# Patient Record
Sex: Male | Born: 1955
Health system: Southern US, Community
[De-identification: ages and names within clinical notes are randomized; demographics above are authoritative.]

## PROBLEM LIST (undated history)

## (undated) DIAGNOSIS — K259 Gastric ulcer, unspecified as acute or chronic, without hemorrhage or perforation: Secondary | ICD-10-CM

## (undated) DIAGNOSIS — R0981 Nasal congestion: Secondary | ICD-10-CM

## (undated) DIAGNOSIS — C801 Malignant (primary) neoplasm, unspecified: Secondary | ICD-10-CM

## (undated) DIAGNOSIS — N183 Chronic kidney disease, stage 3 unspecified: Secondary | ICD-10-CM

## (undated) DIAGNOSIS — K759 Inflammatory liver disease, unspecified: Secondary | ICD-10-CM

## (undated) DIAGNOSIS — I10 Essential (primary) hypertension: Secondary | ICD-10-CM

## (undated) DIAGNOSIS — J449 Chronic obstructive pulmonary disease, unspecified: Secondary | ICD-10-CM

## (undated) DIAGNOSIS — B1920 Unspecified viral hepatitis C without hepatic coma: Secondary | ICD-10-CM

## (undated) DIAGNOSIS — F419 Anxiety disorder, unspecified: Secondary | ICD-10-CM

## (undated) DIAGNOSIS — Z923 Personal history of irradiation: Secondary | ICD-10-CM

## (undated) HISTORY — PX: HERNIA REPAIR: SHX51

## (undated) HISTORY — PX: STOMACH SURGERY: SHX791

---

## 1998-10-27 ENCOUNTER — Encounter: Payer: Self-pay | Admitting: Emergency Medicine

## 1998-10-27 ENCOUNTER — Inpatient Hospital Stay (HOSPITAL_COMMUNITY): Admission: EM | Admit: 1998-10-27 | Discharge: 1998-10-28 | Payer: Self-pay | Admitting: Emergency Medicine

## 1999-05-18 ENCOUNTER — Emergency Department (HOSPITAL_COMMUNITY): Admission: EM | Admit: 1999-05-18 | Discharge: 1999-05-18 | Payer: Self-pay | Admitting: Emergency Medicine

## 1999-05-23 ENCOUNTER — Emergency Department (HOSPITAL_COMMUNITY): Admission: EM | Admit: 1999-05-23 | Discharge: 1999-05-23 | Payer: Self-pay | Admitting: Emergency Medicine

## 2000-02-16 ENCOUNTER — Encounter: Payer: Self-pay | Admitting: Emergency Medicine

## 2000-02-16 ENCOUNTER — Emergency Department (HOSPITAL_COMMUNITY): Admission: EM | Admit: 2000-02-16 | Discharge: 2000-02-16 | Payer: Self-pay | Admitting: Emergency Medicine

## 2000-07-18 ENCOUNTER — Emergency Department (HOSPITAL_COMMUNITY): Admission: EM | Admit: 2000-07-18 | Discharge: 2000-07-18 | Payer: Self-pay | Admitting: Emergency Medicine

## 2001-01-14 ENCOUNTER — Emergency Department (HOSPITAL_COMMUNITY): Admission: EM | Admit: 2001-01-14 | Discharge: 2001-01-14 | Payer: Self-pay | Admitting: Emergency Medicine

## 2001-01-14 ENCOUNTER — Encounter: Payer: Self-pay | Admitting: Emergency Medicine

## 2001-11-16 ENCOUNTER — Emergency Department (HOSPITAL_COMMUNITY): Admission: EM | Admit: 2001-11-16 | Discharge: 2001-11-16 | Payer: Self-pay | Admitting: Emergency Medicine

## 2002-07-09 ENCOUNTER — Inpatient Hospital Stay (HOSPITAL_COMMUNITY): Admission: EM | Admit: 2002-07-09 | Discharge: 2002-07-10 | Payer: Self-pay

## 2002-07-19 ENCOUNTER — Emergency Department (HOSPITAL_COMMUNITY): Admission: EM | Admit: 2002-07-19 | Discharge: 2002-07-19 | Payer: Self-pay | Admitting: Emergency Medicine

## 2002-07-19 ENCOUNTER — Encounter: Payer: Self-pay | Admitting: Emergency Medicine

## 2003-10-24 ENCOUNTER — Emergency Department (HOSPITAL_COMMUNITY): Admission: EM | Admit: 2003-10-24 | Discharge: 2003-10-24 | Payer: Self-pay | Admitting: Family Medicine

## 2004-04-06 ENCOUNTER — Emergency Department (HOSPITAL_COMMUNITY): Admission: EM | Admit: 2004-04-06 | Discharge: 2004-04-06 | Payer: Self-pay | Admitting: Emergency Medicine

## 2004-06-22 ENCOUNTER — Emergency Department (HOSPITAL_COMMUNITY): Admission: EM | Admit: 2004-06-22 | Discharge: 2004-06-22 | Payer: Self-pay | Admitting: Emergency Medicine

## 2004-08-03 ENCOUNTER — Emergency Department (HOSPITAL_COMMUNITY): Admission: EM | Admit: 2004-08-03 | Discharge: 2004-08-03 | Payer: Self-pay | Admitting: Emergency Medicine

## 2004-09-25 ENCOUNTER — Emergency Department (HOSPITAL_COMMUNITY): Admission: EM | Admit: 2004-09-25 | Discharge: 2004-09-25 | Payer: Self-pay | Admitting: Emergency Medicine

## 2006-03-12 ENCOUNTER — Emergency Department (HOSPITAL_COMMUNITY): Admission: EM | Admit: 2006-03-12 | Discharge: 2006-03-12 | Payer: Self-pay | Admitting: Emergency Medicine

## 2006-04-05 ENCOUNTER — Emergency Department (HOSPITAL_COMMUNITY): Admission: EM | Admit: 2006-04-05 | Discharge: 2006-04-05 | Payer: Self-pay | Admitting: Emergency Medicine

## 2006-04-10 ENCOUNTER — Ambulatory Visit: Payer: Self-pay | Admitting: Nurse Practitioner

## 2006-04-13 ENCOUNTER — Ambulatory Visit: Payer: Self-pay | Admitting: *Deleted

## 2008-09-21 ENCOUNTER — Ambulatory Visit: Payer: Self-pay | Admitting: Internal Medicine

## 2008-09-29 ENCOUNTER — Encounter: Payer: Self-pay | Admitting: Internal Medicine

## 2008-09-29 ENCOUNTER — Ambulatory Visit: Payer: Self-pay | Admitting: Internal Medicine

## 2008-09-29 ENCOUNTER — Ambulatory Visit (HOSPITAL_COMMUNITY): Admission: RE | Admit: 2008-09-29 | Discharge: 2008-09-29 | Payer: Self-pay | Admitting: Internal Medicine

## 2008-09-29 ENCOUNTER — Ambulatory Visit: Payer: Self-pay | Admitting: Surgery

## 2008-09-30 ENCOUNTER — Encounter (INDEPENDENT_AMBULATORY_CARE_PROVIDER_SITE_OTHER): Payer: Self-pay | Admitting: Internal Medicine

## 2008-09-30 LAB — CONVERTED CEMR LAB
Albumin: 4.6 g/dL (ref 3.5–5.2)
Basophils Relative: 1 % (ref 0–1)
Chloride: 102 meq/L (ref 96–112)
Cholesterol: 185 mg/dL (ref 0–200)
Folate: 10.2 ng/mL
Hemoglobin: 15.5 g/dL (ref 13.0–17.0)
LDL Cholesterol: 85 mg/dL (ref 0–99)
Lymphocytes Relative: 45 % (ref 12–46)
Lymphs Abs: 3.2 10*3/uL (ref 0.7–4.0)
MCV: 94.9 fL (ref 78.0–100.0)
Monocytes Relative: 11 % (ref 3–12)
Neutro Abs: 3.1 10*3/uL (ref 1.7–7.7)
Neutrophils Relative %: 42 % — ABNORMAL LOW (ref 43–77)
Platelets: 242 10*3/uL (ref 150–400)
RDW: 13.8 % (ref 11.5–15.5)
Total CHOL/HDL Ratio: 2.2
Total Protein: 8.1 g/dL (ref 6.0–8.3)
Triglycerides: 81 mg/dL (ref <150)
VLDL: 16 mg/dL (ref 0–40)
Vitamin B-12: 507 pg/mL (ref 211–911)
WBC: 7.2 10*3/uL (ref 4.0–10.5)

## 2008-10-15 ENCOUNTER — Ambulatory Visit: Payer: Self-pay | Admitting: Internal Medicine

## 2010-06-21 ENCOUNTER — Emergency Department (HOSPITAL_COMMUNITY): Admission: EM | Admit: 2010-06-21 | Discharge: 2010-06-21 | Payer: Self-pay | Admitting: Emergency Medicine

## 2010-08-25 ENCOUNTER — Emergency Department (HOSPITAL_COMMUNITY)
Admission: EM | Admit: 2010-08-25 | Discharge: 2010-08-25 | Payer: Self-pay | Source: Home / Self Care | Admitting: Emergency Medicine

## 2010-12-22 ENCOUNTER — Emergency Department (HOSPITAL_COMMUNITY): Payer: Self-pay

## 2010-12-22 ENCOUNTER — Emergency Department (HOSPITAL_COMMUNITY)
Admission: EM | Admit: 2010-12-22 | Discharge: 2010-12-22 | Disposition: A | Discharge status: Home / Self Care | Payer: Self-pay | Attending: Emergency Medicine | Admitting: Emergency Medicine

## 2010-12-22 DIAGNOSIS — M5412 Radiculopathy, cervical region: Secondary | ICD-10-CM | POA: Insufficient documentation

## 2010-12-22 DIAGNOSIS — G500 Trigeminal neuralgia: Secondary | ICD-10-CM | POA: Insufficient documentation

## 2010-12-22 DIAGNOSIS — R209 Unspecified disturbances of skin sensation: Secondary | ICD-10-CM | POA: Insufficient documentation

## 2010-12-22 DIAGNOSIS — M479 Spondylosis, unspecified: Secondary | ICD-10-CM | POA: Insufficient documentation

## 2010-12-22 DIAGNOSIS — M47812 Spondylosis without myelopathy or radiculopathy, cervical region: Secondary | ICD-10-CM | POA: Insufficient documentation

## 2010-12-22 DIAGNOSIS — M1990 Unspecified osteoarthritis, unspecified site: Secondary | ICD-10-CM | POA: Insufficient documentation

## 2010-12-22 LAB — DIFFERENTIAL
Basophils Relative: 1 % (ref 0–1)
Eosinophils Absolute: 0.2 10*3/uL (ref 0.0–0.7)
Eosinophils Relative: 3 % (ref 0–5)
Lymphs Abs: 2.8 10*3/uL (ref 0.7–4.0)
Monocytes Absolute: 0.9 10*3/uL (ref 0.1–1.0)

## 2010-12-22 LAB — CBC
MCHC: 35.3 g/dL (ref 30.0–36.0)
Platelets: 227 10*3/uL (ref 150–400)
RDW: 13.6 % (ref 11.5–15.5)

## 2010-12-22 LAB — POCT I-STAT, CHEM 8
Calcium, Ion: 1.12 mmol/L (ref 1.12–1.32)
Chloride: 103 mEq/L (ref 96–112)

## 2010-12-22 LAB — SEDIMENTATION RATE: Sed Rate: 0 mm/hr (ref 0–16)

## 2010-12-28 ENCOUNTER — Inpatient Hospital Stay (INDEPENDENT_AMBULATORY_CARE_PROVIDER_SITE_OTHER)
Admission: RE | Admit: 2010-12-28 | Discharge: 2010-12-28 | Disposition: A | Discharge status: Home / Self Care | Payer: Self-pay | Source: Ambulatory Visit

## 2010-12-28 DIAGNOSIS — M1990 Unspecified osteoarthritis, unspecified site: Secondary | ICD-10-CM

## 2010-12-28 DIAGNOSIS — G542 Cervical root disorders, not elsewhere classified: Secondary | ICD-10-CM

## 2010-12-28 DIAGNOSIS — R51 Headache: Secondary | ICD-10-CM

## 2011-01-20 NOTE — Discharge Summary (Signed)
   NAME:  [name] [name], [name] [name]                         ACCOUNT NO.:  [id number]   MEDICAL RECORD NO.:  [id number]                   PATIENT TYPE:  INP   LOCATION:  2018                                 FACILITY:  [facility]   PHYSICIAN:  Oretha Ellis, M.D.                DATE OF BIRTH:  1955-11-06   DATE OF ADMISSION:  07/09/2002  DATE OF DISCHARGE:  07/10/2002                                 DISCHARGE SUMMARY   DISCHARGE DIAGNOSIS:  Cocaine-related chest pain.   DISCHARGE MEDICATIONS:  None.   FOLLOW UP:  The patient will follow up with Dr. Oretha Ellis in [facility] on Wednesday, November 26, at 2:30 p.m.  Call [phone]  with questions.   H&P/HOSPITAL COURSE:  The patient is a 55 year old male in his usual state  of health until he went on a cocaine binge the night prior to admission.  He  got home, was washing dishes, and had 5-6/10 type pain from the right  shoulder to the left shoulder and up into his neck.  The pain was eased with  movement and deep inspiration.  The pain then suddenly came back.  He slept  through it and presented to the ER the next morning because the pain would  not go away.  In the ER the pain is relieved with sublingual nitroglycerin  and aspirin with no nausea and vomiting or diaphoresis, minimal shortness of  breath, and no orthopnea or PND.   The patient's physical exam was unremarkable.   EKG showed normal sinus rhythm with incomplete right bundle branch block  consistent with no changes from an EKG taken in the year 2000.  Cardiac  enzymes were negative with CK peaking at 5.07, MB peaking at 2.7, and  troponin I peaking at 0.2.  The patient has a half pack per day x15 years  smoking history and excessive alcohol history.  No family history of heart  disease.   DISPOSITION:  The patient was discharged home in stable condition after  ruling out for MI.  He is encouraged to cease cocaine and alcohol abuse.  He  will be discharged home  to the [facility] and is given information  regarding alcohol and drug rehabilitation programs within the [place]  area.  The patient is encouraged to go to the [facility]  for his regular clinical care.  He has an appointment with Dr. Viviann Spare on  November 26.                                               Oretha Ellis, M.D.    BT/MEDQ  D:  07/10/2002  T:  07/11/2002  Job:  [id number]

## 2012-03-21 ENCOUNTER — Emergency Department (HOSPITAL_COMMUNITY)
Admission: EM | Admit: 2012-03-21 | Discharge: 2012-03-21 | Disposition: A | Discharge status: Home / Self Care | Payer: Self-pay | Attending: Emergency Medicine | Admitting: Emergency Medicine

## 2012-03-21 ENCOUNTER — Encounter (HOSPITAL_COMMUNITY): Payer: Self-pay | Admitting: *Deleted

## 2012-03-21 DIAGNOSIS — J3489 Other specified disorders of nose and nasal sinuses: Secondary | ICD-10-CM | POA: Insufficient documentation

## 2012-03-21 DIAGNOSIS — E869 Volume depletion, unspecified: Secondary | ICD-10-CM | POA: Insufficient documentation

## 2012-03-21 DIAGNOSIS — F17200 Nicotine dependence, unspecified, uncomplicated: Secondary | ICD-10-CM | POA: Insufficient documentation

## 2012-03-21 HISTORY — DX: Gastric ulcer, unspecified as acute or chronic, without hemorrhage or perforation: K25.9

## 2012-03-21 LAB — BASIC METABOLIC PANEL
Calcium: 8.9 mg/dL (ref 8.4–10.5)
Creatinine, Ser: 0.72 mg/dL (ref 0.50–1.35)
GFR calc non Af Amer: >90 mL/min (ref >90)
Glucose, Bld: 77 mg/dL (ref 70–99)
Potassium: 3.7 mEq/L (ref 3.5–5.1)
Sodium: 137 mEq/L (ref 135–145)

## 2012-03-21 LAB — CBC
HCT: 42.5 % (ref 39.0–52.0)
Hemoglobin: 14.8 g/dL (ref 13.0–17.0)
MCH: 32.8 pg (ref 26.0–34.0)
MCHC: 34.8 g/dL (ref 30.0–36.0)
MCV: 94.2 fL (ref 78.0–100.0)
Platelets: 202 10*3/uL (ref 150–400)

## 2012-03-21 MED ORDER — SODIUM CHLORIDE 0.9 % IV BOLUS (SEPSIS)
2000.0000 mL | Freq: Once | INTRAVENOUS | Status: AC
  Administered 2012-03-21: 2000 mL via INTRAVENOUS

## 2012-03-21 MED ORDER — MORPHINE SULFATE 4 MG/ML IJ SOLN
4.0000 mg | Freq: Once | INTRAMUSCULAR | Status: AC
  Administered 2012-03-21: 4 mg via INTRAVENOUS
  Filled 2012-03-21: qty 1

## 2012-03-21 NOTE — ED Notes (Signed)
Per EMS pt complaining of dizziness since he woke up this morning, said he worked outside all day yesterday, went to birthday party last night, has lots of alcohol, didn't eat much, no water today, went to [facility] clinic, BP 143/87, CBG 121, orthostatics negative, HR 90, sinus, 250cc NS given.

## 2012-03-21 NOTE — ED Provider Notes (Signed)
History     CSN: [id number]  Arrival date & time 03/21/12  1109   First MD Initiated Contact with Patient 03/21/12 1112      Chief Complaint  Patient presents with  . Dehydration  . Dizziness    (Consider location/radiation/quality/duration/timing/severity/associated sxs/prior treatment) The history is provided by the patient.   patient reports ongoing dizziness as he woke up today.  He describes his dizziness as a lightheadedness and feeling faint when he stands up.  He denies weakness of his upper lower extremities.  His had no change in speech.  His had no confusion.  He's been able to walk without difficulty.  The patient reports working outside all day yesterday and then having approximately 12 beers last night.  Denies fevers or chills.  Has no prior history of stroke.  He denies melena or hematochezia.  He does report that he drinks alcohol most days.  Nothing improves his symptoms.  Symptoms are worsened by standing up Past Medical History  Diagnosis Date  . Stomach ulcer     Past Surgical History  Procedure Date  . Hernia repair     History reviewed. No pertinent family history.  History  Substance Use Topics  . Smoking status: Current Everyday Smoker  . Smokeless tobacco: Never Used  . Alcohol Use: Yes      Review of Systems  All other systems reviewed and are negative.    Allergies  Review of patient's allergies indicates no known allergies.  Home Medications   Current Outpatient Rx  Name Route Sig Dispense Refill  . ASPIRIN 81 MG PO TABS Oral Take 81 mg by mouth daily.      BP 113/93  Pulse 98  Temp 98.2 F (36.8 C)  Resp 18  SpO2 99%  Physical Exam  Nursing note and vitals reviewed. Constitutional: He is oriented to person, place, and time. He appears well-developed and well-nourished.  HENT:  Head: Normocephalic and atraumatic.  Eyes: EOM are normal. Pupils are equal, round, and reactive to light.  Neck: Normal range of motion.    Cardiovascular: Normal rate, regular rhythm, normal heart sounds and intact distal pulses.   Pulmonary/Chest: Effort normal and breath sounds normal. No respiratory distress.  Abdominal: Soft. He exhibits no distension. There is no tenderness.  Musculoskeletal: Normal range of motion.  Neurological: He is alert and oriented to person, place, and time.       5/5 strength in major muscle groups of  bilateral upper and lower extremities. Speech normal. No facial asymetry.   Skin: Skin is warm and dry.  Psychiatric: He has a normal mood and affect. Judgment normal.    ED Course  Procedures (including critical care time)   Labs Reviewed  CBC  BASIC METABOLIC PANEL   No results found.   1. Volume depletion   2. Sinus pressure       MDM  The patient has been hydrated in the emergency department.  He does report that he feels somewhat better.  He still has headache although it is improved.  He's been walking around the halls in the emergency apartment without difficulty.  Is normal neurologic exam and doubly this to be an acute stroke.  The patient be discharged home with close PCP followup.  I suspect this is volume depletion  1:38 PM The patient feels somewhat better at this time.  He now reports that when he stands up he develops pressure in his bilateral maxillary sinuses.  He has had some  nasal congestion.  Some the sounds were sinus related.  The patient we discharged him.  This is not the presentation of an acute stroke.         Lyanne Co, MD 03/21/12 [phone]

## 2012-03-21 NOTE — ED Notes (Signed)
Pt states feeling a lot better, pt explained discharge instructions, escorted to discharge window, in no distress.

## 2012-03-21 NOTE — ED Notes (Signed)
Pt given [place] sandwich, chips and ginger ale. Liter bolus hung.

## 2012-03-21 NOTE — ED Notes (Signed)
Pt states he worked outside all day yesterday, drink some water but not much, then drink 12+ beers last night at a birthday party, pt states woke up this morning feeling lightheaded, dizzy, shaky, did not eat a lot yesterday either, states has eaten a banana today and drink milk and water today. Pt states when he stands up he feels "off balance". Pt states he feels short of breath at times, but thinks its from his anxiety d/t the way he's feeling. Pt a/o x 4.

## 2012-03-21 NOTE — ED Notes (Signed)
500cc NS given by EMS

## 2012-06-14 ENCOUNTER — Emergency Department (HOSPITAL_COMMUNITY)
Admission: EM | Admit: 2012-06-14 | Discharge: 2012-06-14 | Disposition: A | Discharge status: Home / Self Care | Payer: Self-pay | Attending: Emergency Medicine | Admitting: Emergency Medicine

## 2012-06-14 ENCOUNTER — Encounter (HOSPITAL_COMMUNITY): Payer: Self-pay | Admitting: *Deleted

## 2012-06-14 DIAGNOSIS — J3489 Other specified disorders of nose and nasal sinuses: Secondary | ICD-10-CM | POA: Insufficient documentation

## 2012-06-14 DIAGNOSIS — F17200 Nicotine dependence, unspecified, uncomplicated: Secondary | ICD-10-CM | POA: Insufficient documentation

## 2012-06-14 MED ORDER — FLUTICASONE PROPIONATE 50 MCG/ACT NA SUSP: 2.0000 | Freq: Two times a day (BID) | NASAL | Status: DC

## 2012-06-14 NOTE — ED Notes (Signed)
Was seen at [facility] for same c/o facial pressure x 3 weeks.

## 2012-06-14 NOTE — ED Provider Notes (Signed)
History   This chart was scribed for No att. providers found by Toya Smothers. The patient was seen in room TR10C/TR10C. Patient's care was started at 1229.  CSN: [id number]  Arrival date & time 06/14/12  1229   First MD Initiated Contact with Patient 06/14/12 1430      Chief Complaint  Patient presents with  . Sinusitis   Patient is a 56 y.o. male presenting with sinusitis. The history is provided by the patient. No language interpreter was used.  Sinusitis  Associated symptoms include congestion. Pertinent negatives include no ear pain.   [name] [name] is a 56 y.o. male who presents to the Emergency Department complaining of 3 weeks of recurrent gradual onset moderate congestion, HA, and neck stiffness. Pain is described as similar to previous chronic episodes. He was evaluated at [facility] several weeks ago for identiacal symptoms and has had mild relief with pain medication. PTA symptoms have been treated with OTC Suphedrine providing mild temporary relief. Pt denies fever, chills, emesis, nausea, rash, and cough.    Past Medical History  Diagnosis Date  . Stomach ulcer     Past Surgical History  Procedure Date  . Hernia repair     No family history on file.  History  Substance Use Topics  . Smoking status: Current Every Day Smoker  . Smokeless tobacco: Never Used  . Alcohol Use: Yes    Review of Systems  Constitutional: Positive for fever.  HENT: Positive for congestion and neck stiffness. Negative for ear pain, nosebleeds and ear discharge.   Gastrointestinal: Negative for nausea and vomiting.  All other systems reviewed and are negative.    Allergies  Review of patient's allergies indicates no known allergies.  Home Medications   Current Outpatient Rx  Name Route Sig Dispense Refill  . ASPIRIN 81 MG PO TABS Oral Take 81 mg by mouth daily.    Marland Kitchen PHENYLEPHRINE HCL 10 MG PO TABS Oral Take 10 mg by mouth every 4 (four) hours as needed. For allergies    .  FLUTICASONE PROPIONATE 50 MCG/ACT NA SUSP Nasal Place 2 sprays into the nose 2 (two) times daily. 16 g 0    BP 98/77  Pulse 69  Temp 98.9 F (37.2 C) (Oral)  Resp 18  SpO2 98%  Physical Exam  Nursing note and vitals reviewed. Constitutional: He is oriented to person, place, and time. He appears well-developed and well-nourished. No distress.  HENT:  Head: Normocephalic and atraumatic.       Good ROM of the neck.  Eyes: Conjunctivae normal and EOM are normal.  Neck: Neck supple. No tracheal deviation present.  Cardiovascular: Normal rate.   Pulmonary/Chest: Effort normal. No respiratory distress.  Abdominal: He exhibits no distension.  Musculoskeletal: Normal range of motion.  Neurological: He is alert and oriented to person, place, and time. No sensory deficit.  Skin: Skin is dry.  Psychiatric: He has a normal mood and affect. His behavior is normal.    ED Course  Procedures  DIAGNOSTIC STUDIES: Oxygen Saturation is 98% on room air, normal by my interpretation.    COORDINATION OF CARE: 14:33- Evaluated Pt. Pt is awake, alert, and oriented. 14:36- Patientinformed of clinical course, understand medical decision-making process, and agree with plan. Plan: Home Medications- Flonase; Home Treatments- heated compress; Recommended follow up- HENT specialist if symptoms worsen and persist.  Labs Reviewed - No data to display No results found.   1. Sinus pain       MDM  Evaluation is consistent with acute sinusitis, possibly, bacterial. Doubt metabolic instability, serious bacterial infection or impending vascular collapse; the patient is stable for discharge.    I personally performed the services described in this documentation, which was scribed in my presence. The recorded information has been reviewed and considered.     Flint Melter, MD 06/14/12 [phone]

## 2012-06-14 NOTE — ED Notes (Signed)
Pt is here for sinus headache with sinus drainage for 3 weeks unrelieved with OTC medication

## 2012-06-17 ENCOUNTER — Emergency Department (HOSPITAL_COMMUNITY)
Admission: EM | Admit: 2012-06-17 | Discharge: 2012-06-17 | Disposition: A | Discharge status: Home / Self Care | Payer: Self-pay | Attending: Emergency Medicine | Admitting: Emergency Medicine

## 2012-06-17 ENCOUNTER — Encounter (HOSPITAL_COMMUNITY): Payer: Self-pay | Admitting: Emergency Medicine

## 2012-06-17 DIAGNOSIS — F17200 Nicotine dependence, unspecified, uncomplicated: Secondary | ICD-10-CM | POA: Insufficient documentation

## 2012-06-17 DIAGNOSIS — J329 Chronic sinusitis, unspecified: Secondary | ICD-10-CM | POA: Insufficient documentation

## 2012-06-17 MED ORDER — SULFAMETHOXAZOLE-TRIMETHOPRIM 800-160 MG PO TABS: 1.0000 | ORAL_TABLET | Freq: Two times a day (BID) | ORAL | Status: DC

## 2012-06-17 MED ORDER — HYDROCODONE-ACETAMINOPHEN 5-325 MG PO TABS: 1.0000 | ORAL_TABLET | Freq: Four times a day (QID) | ORAL | Status: DC | PRN

## 2012-06-17 NOTE — ED Provider Notes (Signed)
History  This chart was scribed for [name] Jakes, MD by Shari Heritage. The patient was seen in room TR10C/TR10C. Patient's care was started at 1710.     CSN: [id number]  Arrival date & time 06/17/12  1552   First MD Initiated Contact with Patient 06/17/12 1710      Chief Complaint  Patient presents with  . Headache     The history is provided by the patient. No language interpreter was used.    KHALEEF [name] is a 56 y.o. male who presents to the Emergency Department complaining of constant, moderate, maxillary sinus pressure onset several weeks ago, but has worsened today. There is associated neck pain and mild SOB. Patient was seen by Dr. Effie Shy on 06/14/12 complaining of the same HA, neck stiffness and congestion. Patient was diagnosed with acute sinusitis. He was discharged with prescription for fluticasone. Patient states that he was improving after using the medication, but now the HA has returned. Patient denies fever, chest pain, abdominal pain. Patient has a history of stomach ulcer and hernia repair. She is a current every day smoker.     Past Medical History  Diagnosis Date  . Stomach ulcer     Past Surgical History  Procedure Date  . Hernia repair     History reviewed. No pertinent family history.  History  Substance Use Topics  . Smoking status: Current Every Day Smoker  . Smokeless tobacco: Never Used  . Alcohol Use: Yes      Review of Systems  HENT: Positive for neck pain.   Cardiovascular: Negative for chest pain.  Gastrointestinal: Negative for abdominal pain.    Allergies  Review of patient's allergies indicates no known allergies.  Home Medications   Current Outpatient Rx  Name Route Sig Dispense Refill  . ASPIRIN EC 81 MG PO TBEC Oral Take 81 mg by mouth daily.    Marland Kitchen FLUTICASONE PROPIONATE 50 MCG/ACT NA SUSP Nasal Place 2 sprays into the nose 2 (two) times daily. 16 g 0  . OXYMETAZOLINE HCL 0.05 % NA SOLN Nasal Place 2 sprays into  the nose 2 (two) times daily as needed. For sinus pain    . PHENYLEPHRINE HCL 10 MG PO TABS Oral Take 10 mg by mouth every 4 (four) hours as needed. For allergies    . HYDROCODONE-ACETAMINOPHEN 5-325 MG PO TABS Oral Take 1-2 tablets by mouth every 6 (six) hours as needed for pain. 14 tablet 0  . SULFAMETHOXAZOLE-TRIMETHOPRIM 800-160 MG PO TABS Oral Take 1 tablet by mouth every 12 (twelve) hours. 20 tablet 0    BP 110/75  Pulse 71  Temp 98.8 F (37.1 C) (Oral)  Resp 16  SpO2 98%  Physical Exam  Nursing note and vitals reviewed. Constitutional: He is oriented to person, place, and time. He appears well-developed and well-nourished.  HENT:  Head: Normocephalic and atraumatic.  Nose: Right sinus exhibits maxillary sinus tenderness. Right sinus exhibits no frontal sinus tenderness. Left sinus exhibits maxillary sinus tenderness. Left sinus exhibits no frontal sinus tenderness.       Mild pressure over the maxillary sinus. No pressure over frontal sinus.  Cardiovascular: Normal rate and regular rhythm.   No murmur heard. Pulmonary/Chest: Effort normal and breath sounds normal. No respiratory distress. He has no wheezes. He has no rales.  Abdominal: Soft. Bowel sounds are normal. There is no tenderness.  Lymphadenopathy:    He has no cervical adenopathy.  Neurological: He is alert and oriented to person, place, and  time.    ED Course  Procedures (including critical care time) DIAGNOSTIC STUDIES: Oxygen Saturation is 98% on room air, normal by my interpretation.    COORDINATION OF CARE: 5:48pm- Patient informed of current plan for treatment and evaluation and agrees with plan at this time.      Labs Reviewed - No data to display No results found.   1. Sinusitis       MDM  Patient with persistent sinus discomfort we'll treat with antibiotics Septra double strength and also pain medicine. Patient in no acute distress some mild bilateral maxillary sinus tenderness no frontal  sinus tenderness.     I personally performed the services described in this documentation, which was scribed in my presence. The recorded information has been reviewed and considered.     [name] Jakes, MD 06/17/12 [phone]

## 2012-06-17 NOTE — ED Notes (Signed)
Pt sts was started on steroids for sinus inflammation and sts is worse today with pressure and pain in neck

## 2012-10-12 ENCOUNTER — Encounter (HOSPITAL_COMMUNITY): Payer: Self-pay | Admitting: *Deleted

## 2012-10-12 ENCOUNTER — Emergency Department (HOSPITAL_COMMUNITY)
Admission: EM | Admit: 2012-10-12 | Discharge: 2012-10-12 | Disposition: A | Discharge status: Home / Self Care | Payer: Self-pay | Attending: Emergency Medicine | Admitting: Emergency Medicine

## 2012-10-12 DIAGNOSIS — Z8711 Personal history of peptic ulcer disease: Secondary | ICD-10-CM | POA: Insufficient documentation

## 2012-10-12 DIAGNOSIS — F17200 Nicotine dependence, unspecified, uncomplicated: Secondary | ICD-10-CM | POA: Insufficient documentation

## 2012-10-12 DIAGNOSIS — J019 Acute sinusitis, unspecified: Secondary | ICD-10-CM

## 2012-10-12 DIAGNOSIS — Z7982 Long term (current) use of aspirin: Secondary | ICD-10-CM | POA: Insufficient documentation

## 2012-10-12 DIAGNOSIS — J0100 Acute maxillary sinusitis, unspecified: Secondary | ICD-10-CM | POA: Insufficient documentation

## 2012-10-12 MED ORDER — FLUTICASONE PROPIONATE 50 MCG/ACT NA SUSP: 2.0000 | Freq: Every day | NASAL | Status: DC

## 2012-10-12 MED ORDER — AMOXICILLIN 500 MG PO CAPS: 500.0000 mg | ORAL_CAPSULE | Freq: Three times a day (TID) | ORAL | Status: DC

## 2012-10-12 NOTE — ED Provider Notes (Signed)
Medical screening examination/treatment/procedure(s) were performed by non-physician practitioner and as supervising physician I was immediately available for consultation/collaboration.  Toy Baker, MD 10/12/12 1524

## 2012-10-12 NOTE — ED Provider Notes (Signed)
History     CSN: [id number]  Arrival date & time 10/12/12  1018   First MD Initiated Contact with Patient 10/12/12 1203      Chief Complaint  Patient presents with  . Facial Pain  . Headache    (Consider location/radiation/quality/duration/timing/severity/associated sxs/prior treatment) HPI  [name] [name] is a 57 y.o. male complaining of sinus congestion and pain worsening over the course of 2 weeks. Patient has multiple prior similar episodes. He denies fever, cough, shortness of breath. He's been using OTC aspirin without relief.  Past Medical History  Diagnosis Date  . Stomach ulcer     Past Surgical History  Procedure Laterality Date  . Hernia repair      History reviewed. No pertinent family history.  History  Substance Use Topics  . Smoking status: Current Every Day Smoker  . Smokeless tobacco: Never Used  . Alcohol Use: Yes      Review of Systems  Constitutional: Negative for fever.  HENT: Positive for congestion and sinus pressure.   Respiratory: Negative for shortness of breath.   Cardiovascular: Negative for chest pain.  Gastrointestinal: Negative for nausea, vomiting, abdominal pain and diarrhea.  All other systems reviewed and are negative.    Allergies  Review of patient's allergies indicates no known allergies.  Home Medications   Current Outpatient Rx  Name  Route  Sig  Dispense  Refill  . fluticasone (FLONASE) 50 MCG/ACT nasal spray   Nasal   Place 2 sprays into the nose 2 (two) times daily.   16 g   0   . oxymetazoline (AFRIN) 0.05 % nasal spray   Nasal   Place 2 sprays into the nose 2 (two) times daily as needed. For sinus pain         . aspirin EC 81 MG tablet   Oral   Take 81 mg by mouth daily.           BP 107/72  Pulse 63  Temp(Src) 98 F (36.7 C) (Oral)  Resp 16  SpO2 100%  Physical Exam  Nursing note and vitals reviewed. Constitutional: He is oriented to person, place, and time. He appears well-developed  and well-nourished. No distress.  HENT:  Head: Normocephalic.  Right Ear: External ear normal.  Left Ear: External ear normal.  Mouth/Throat: Oropharynx is clear and moist.  Mild tenderness to palpation of bilateral maxillary sinuses, posterior pharynx is mildly injected.  Eyes: Conjunctivae and EOM are normal. Pupils are equal, round, and reactive to light.  Cardiovascular: Normal rate.   Pulmonary/Chest: Effort normal. No stridor.  Musculoskeletal: Normal range of motion.  Neurological: He is alert and oriented to person, place, and time.  Psychiatric: He has a normal mood and affect.    ED Course  Procedures (including critical care time)  Labs Reviewed - No data to display No results found.   1. Acute sinusitis       MDM     Filed Vitals:   10/12/12 1034  BP: 107/72  Pulse: 63  Temp: 98 F (36.7 C)  TempSrc: Oral  Resp: 16  SpO2: 100%     Pt verbalized understanding and agrees with care plan. Outpatient follow-up and return precautions given.    New Prescriptions   AMOXICILLIN (AMOXIL) 500 MG CAPSULE    Take 1 capsule (500 mg total) by mouth 3 (three) times daily.   FLUTICASONE (FLONASE) 50 MCG/ACT NASAL SPRAY    Place 2 sprays into the nose daily.  Wynetta Emery, PA-C 10/12/12 1319

## 2012-10-12 NOTE — ED Notes (Signed)
Pt c/o of soreness/tightness and tenderness in frontal sinuses. Pt reports he has been here a few times for the same issues that past couple months. Pt reports they told him he has a sinus infection. He took the antibiotics and finished all of them and felt much better afterwards, pt reports about 2 weeks ago he started to have symptoms.

## 2012-10-12 NOTE — ED Notes (Signed)
Reports having facial, head congestion and headache yesterday no relief with otc meds. Thinks its related to his sinuses. No acute distress noted at triage.

## 2012-11-13 ENCOUNTER — Emergency Department (HOSPITAL_COMMUNITY)
Admission: EM | Admit: 2012-11-13 | Discharge: 2012-11-13 | Disposition: A | Discharge status: Home / Self Care | Payer: Self-pay | Source: Home / Self Care

## 2012-11-13 ENCOUNTER — Encounter (HOSPITAL_COMMUNITY): Payer: Self-pay

## 2012-11-13 DIAGNOSIS — J329 Chronic sinusitis, unspecified: Secondary | ICD-10-CM

## 2012-11-13 MED ORDER — FLUTICASONE PROPIONATE 50 MCG/ACT NA SUSP: 2.0000 | Freq: Two times a day (BID) | NASAL | Status: DC

## 2012-11-13 MED ORDER — TRAMADOL HCL 50 MG PO TABS: 50.0000 mg | ORAL_TABLET | Freq: Four times a day (QID) | ORAL | Status: DC | PRN

## 2012-11-13 MED ORDER — AMOXICILLIN-POT CLAVULANATE 875-125 MG PO TABS: 1.0000 | ORAL_TABLET | Freq: Two times a day (BID) | ORAL | Status: DC

## 2012-11-13 NOTE — ED Provider Notes (Signed)
History     CSN: [id number]  Arrival date & time 11/13/12  1125  Chief concern headache     Chief Complaint  Patient presents with  . Establish Care    (Consider location/radiation/quality/duration/timing/severity/associated sxs/prior treatment) HPI Patient is 57 year old male who presents to clinic with main concern of frontal area headaches. He explains he has history of sinusitis and has recently been treated with antibiotic but that has not helped. He continues to experience bowel and pressure-like headache, constant and 7/10 severity, associated with generalized weakness. He reports subjective fevers and chills, intermittent episodes of sore throat. He denies difficulty swallowing. Patient denies chest pain or shortness of breath, no other systemic symptoms, no other associated symptoms, no specific aggravating or alleviating factors. He explains he has been taking over-the-counter Sudafed but has stopped since it did not alleviate pain. Patient denies vision changes, no focal neurological symptoms. Patient denies ear pain, discharge from ears.  Past Medical History  Diagnosis Date  . Stomach ulcer     Past Surgical History  Procedure Laterality Date  . Hernia repair      No known medical family history.  History  Substance Use Topics  . Smoking status: Current Every Day Smoker  . Smokeless tobacco: Never Used  . Alcohol Use: Yes     Review of Systems  Constitutional: positive\ for fever, chills, negative for diaphoresis, activity change, appetite change and fatigue.  HENT: Negative for ear pain, nosebleeds, congestion, facial swelling, rhinorrhea, neck pain, neck stiffness and ear discharge.   Eyes: Negative for pain, discharge, redness, itching and visual disturbance.  Respiratory: Negative for choking, chest tightness, shortness of breath, wheezing and stridor.   Cardiovascular: Negative for chest pain, palpitations and leg swelling.  Gastrointestinal: Negative  for abdominal distention.  Genitourinary: Negative for dysuria, urgency, frequency, hematuria, flank pain, decreased urine volume, difficulty urinating and dyspareunia.  Musculoskeletal: Negative for back pain, joint swelling, arthralgias and gait problem.  Neurological: Negative for dizziness, tremors, seizures, syncope, facial asymmetry, speech difficulty, weakness, light-headedness, numbness and headaches.  Hematological: Negative for adenopathy. Does not bruise/bleed easily.  Psychiatric/Behavioral: Negative for hallucinations, behavioral problems, confusion, dysphoric mood, decreased concentration and agitation.    Allergies  Review of patient's allergies indicates no known allergies.  Home Medications   Current Outpatient Rx  Name  Route  Sig  Dispense  Refill  . amoxicillin (AMOXIL) 500 MG capsule   Oral   Take 1 capsule (500 mg total) by mouth 3 (three) times daily.   21 capsule   0   . aspirin EC 81 MG tablet   Oral   Take 81 mg by mouth daily.         . fluticasone (FLONASE) 50 MCG/ACT nasal spray   Nasal   Place 2 sprays into the nose 2 (two) times daily.   16 g   0   . fluticasone (FLONASE) 50 MCG/ACT nasal spray   Nasal   Place 2 sprays into the nose daily.   16 g   0   . oxymetazoline (AFRIN) 0.05 % nasal spray   Nasal   Place 2 sprays into the nose 2 (two) times daily as needed. For sinus pain           BP 117/84  Pulse 64  Temp(Src) 98.5 F (36.9 C) (Oral)  SpO2 100%  Physical Exam Physical Exam  Constitutional: Appears well-developed and well-nourished. No distress.  HENT: Normocephalic. External right and left ear normal. Oropharynx is slightly erythematous.  frontal and maxillary sinus tenderness Eyes: Conjunctivae and EOM are normal. PERRLA, no scleral icterus.  Neck: Normal ROM. Neck supple. No JVD. No tracheal deviation. No thyromegaly.  CVS: RRR, S1/S2 +, no murmurs, no gallops, no carotid bruit.  Pulmonary: Effort and breath sounds  normal, no stridor, rhonchi, wheezes, rales.  Abdominal: Soft. BS +,  no distension, tenderness, rebound or guarding.  Musculoskeletal: Normal range of motion. No edema and no tenderness.  Lymphadenopathy: No lymphadenopathy noted, cervical, inguinal. Neuro: Alert. Normal reflexes, muscle tone coordination. No cranial nerve deficit. Skin: Skin is warm and dry. No rash noted. Not diaphoretic. No erythema. No pallor.  Psychiatric: Normal mood and affect. Behavior, judgment, thought content normal.    ED Course  Procedures (including critical care time)  Labs Reviewed - No data to display No results found.  Sinusitis - Symptoms and physical exam findings consistent with acute sinusitis - H. and has significant tenderness on exam and frontal and maxillary area associated with erythema in oropharynx - I will provide course of Augmentin for 10 days - Patient advise to place warm compresses or cold compresses whichever it alleviates the discomfort in the sinus areas - Pt  also advised to come back to clinic if his symptoms do not improve in 1-2 weeks    MDM  Sinusitis - ABX treatment        Alison Murray, MD 11/13/12 1215

## 2012-11-13 NOTE — ED Notes (Signed)
Patient here to establish care\ Complains of sinus problems, pain in face SOB Has been going on and off for almost 6 months

## 2013-01-28 ENCOUNTER — Ambulatory Visit: Payer: Self-pay

## 2013-01-29 ENCOUNTER — Ambulatory Visit: Payer: No Typology Code available for payment source | Attending: Family Medicine | Admitting: Internal Medicine

## 2013-01-29 VITALS — BP 104/72 | HR 86 | Temp 98.9°F | Resp 18 | Wt 142.6 lb

## 2013-01-29 DIAGNOSIS — J329 Chronic sinusitis, unspecified: Secondary | ICD-10-CM | POA: Insufficient documentation

## 2013-01-29 MED ORDER — FLUTICASONE PROPIONATE 50 MCG/ACT NA SUSP: 2.0000 | Freq: Every day | NASAL | Status: DC

## 2013-01-29 MED ORDER — OXYMETAZOLINE HCL 0.05 % NA SOLN: 2.0000 | Freq: Two times a day (BID) | NASAL | Status: DC | PRN

## 2013-01-29 MED ORDER — LORATADINE-PSEUDOEPHEDRINE ER 10-240 MG PO TB24: 1.0000 | ORAL_TABLET | Freq: Every day | ORAL | Status: DC

## 2013-01-29 MED ORDER — AMOXICILLIN-POT CLAVULANATE 875-125 MG PO TABS: 1.0000 | ORAL_TABLET | Freq: Two times a day (BID) | ORAL | Status: DC

## 2013-01-29 NOTE — Progress Notes (Signed)
Patient states suffers from sinus pain Has been seen in the past for the same problem

## 2013-01-29 NOTE — Patient Instructions (Addendum)
Sinusitis Sinusitis is redness, soreness, and puffiness (inflammation) of the air pockets in the bones of your face (sinuses). The redness, soreness, and puffiness can cause air and mucus to get trapped in your sinuses. This can allow germs to grow and cause an infection.  HOME CARE   Drink enough fluids to keep your pee (urine) clear or pale yellow.  Use a humidifier in your home.  Run a hot shower to create steam in the bathroom. Sit in the bathroom with the door closed. Breathe in the steam 3 4 times a day.  Put a warm, moist washcloth on your face 3 4 times a day, or as told by your doctor.  Use salt water sprays (saline sprays) to wet the thick fluid in your nose. This can help the sinuses drain.  Only take medicine as told by your doctor. GET HELP RIGHT AWAY IF:   Your pain gets worse.  You have very bad headaches.  You are sick to your stomach (nauseous).  You throw up (vomit).  You are very sleepy (drowsy) all the time.  Your face is puffy (swollen).  Your vision changes.  You have a stiff neck.  You have trouble breathing. MAKE SURE YOU:   Understand these instructions.  Will watch your condition.  Will get help right away if you are not doing well or get worse. Document Released: 02/07/2008 Document Revised: 05/15/2012 Document Reviewed: 03/26/2012 [employer] Patient Information 2014 [employer].  

## 2013-01-29 NOTE — Progress Notes (Signed)
Patient ID: [name] [name], male   DOB: 1956/08/08, 57 y.o.   MRN: [id number] Patient Demographics  [name] [name], is a 57 y.o. male  [name]  [name]  DOB - [birth year]  Chief Complaint  Patient presents with  . Sinus Problem        Subjective:   [name] [name] today is here for a follow up visit. Patient states that he could not afford Flonase. His symptoms improved when he was started on Augmentin but they're returning back. Patient is frustrated with sinus congestion and headache, nasal stuffiness. No cough or any fevers chills   Patient has  No chest pain, No abdominal pain - No Nausea, No new weakness tingling or numbness, No Cough - SOB.   Objective:    Filed Vitals:   01/29/13 1638  BP: 104/72  Pulse: 86  Temp: 98.9 F (37.2 C)  Resp: 18  Weight: 142 lb 9.6 oz (64.683 kg)  SpO2: 99%     ALLERGIES:  No Known Allergies  PAST MEDICAL HISTORY: Past Medical History  Diagnosis Date  . Stomach ulcer     MEDICATIONS AT HOME: Prior to Admission medications   Medication Sig Start Date End Date Taking? Authorizing Provider  amoxicillin-clavulanate (AUGMENTIN) 875-125 MG per tablet Take 1 tablet by mouth 2 (two) times daily. X 10days 01/29/13   Cylas Falzone Jenna Luo, MD  aspirin EC 81 MG tablet Take 81 mg by mouth daily.    Historical Provider, MD  fluticasone (FLONASE) 50 MCG/ACT nasal spray Place 2 sprays into the nose 2 (two) times daily. 11/13/12   Alison Murray, MD  fluticasone (FLONASE) 50 MCG/ACT nasal spray Place 2 sprays into the nose daily. 01/29/13   Deseri Loss Jenna Luo, MD  loratadine-pseudoephedrine (CLARITIN-D 24 HOUR) 10-240 MG per 24 hr tablet Take 1 tablet by mouth daily. 01/29/13   Jaedin Regina Jenna Luo, MD  oxymetazoline (AFRIN) 0.05 % nasal spray Place 2 sprays into the nose 2 (two) times daily as needed. For sinus pain 01/29/13   Torben Soloway Jenna Luo, MD  traMADol (ULTRAM) 50 MG tablet Take 1 tablet (50 mg total) by mouth every 6 (six) hours as needed for pain.  11/13/12   Alison Murray, MD     Exam  General appearance :Awake, alert, NAD, Speech Clear.  HEENT: Atraumatic and Normocephalic, PERLA Neck: supple, no JVD. No cervical lymphadenopathy. No maxillary sinus tenderness  Chest: Clear to auscultation bilaterally, no wheezing, rales or rhonchi CVS: S1 S2 regular, no murmurs.  Abdomen: soft, NBS, NT, ND, no gaurding, rigidity or rebound. Extremities: no cyanosis or clubbing, B/L Lower Ext shows no edema Neurology: Awake alert, and oriented X 3, CN II-XII intact, Non focal Skin: No Rash or lesions Wounds:N/A    Data Review   Basic Metabolic Panel: No results found for this basename: NA, K, CL, CO2, GLUCOSE, BUN, CREATININE, CALCIUM, MG, PHOS,  in the last 168 hours Liver Function Tests: No results found for this basename: AST, ALT, ALKPHOS, BILITOT, PROT, ALBUMIN,  in the last 168 hours  CBC: No results found for this basename: WBC, NEUTROABS, HGB, HCT, MCV, PLT,  in the last 168 hours  ------------------------------------------------------------------------------------------------------------------ No results found for this basename: HGBA1C,  in the last 72 hours ------------------------------------------------------------------------------------------------------------------ No results found for this basename: CHOL, HDL, LDLCALC, TRIG, CHOLHDL, LDLDIRECT,  in the last 72 hours ------------------------------------------------------------------------------------------------------------------ No results found for this basename: TSH, T4TOTAL, FREET3, T3FREE, THYROIDAB,  in the last 72 hours ------------------------------------------------------------------------------------------------------------------ No results found for this basename: VITAMINB12,  FOLATE, FERRITIN, TIBC, IRON, RETICCTPCT,  in the last 72 hours  Coagulation profile  No results found for this basename: INR, PROTIME,  in the last 168 hours    Assessment & Plan    Active Problems: Sinusitis - Advised patient to stay hydrated, started on Claritin-D - He has lost of prescription of Flonase, refilled Flonase, Afrin nasal spray - He prefers Augmentin, will continue for another 10 days  Patient was advised to call if not improving in next 2 weeks, he will likely need CT sinuses and referral to ENT  Follow-up in 3 months if doing well     Ignacia Gentzler M.D. 01/29/2013, 5:15 PM

## 2013-05-18 ENCOUNTER — Encounter (HOSPITAL_COMMUNITY): Payer: Self-pay | Admitting: Family Medicine

## 2013-05-18 ENCOUNTER — Emergency Department (HOSPITAL_COMMUNITY)
Admission: EM | Admit: 2013-05-18 | Discharge: 2013-05-18 | Disposition: A | Discharge status: Home / Self Care | Payer: No Typology Code available for payment source | Attending: Emergency Medicine | Admitting: Emergency Medicine

## 2013-05-18 DIAGNOSIS — F17200 Nicotine dependence, unspecified, uncomplicated: Secondary | ICD-10-CM | POA: Insufficient documentation

## 2013-05-18 DIAGNOSIS — J3489 Other specified disorders of nose and nasal sinuses: Secondary | ICD-10-CM | POA: Insufficient documentation

## 2013-05-18 DIAGNOSIS — Z7982 Long term (current) use of aspirin: Secondary | ICD-10-CM | POA: Insufficient documentation

## 2013-05-18 DIAGNOSIS — Z8711 Personal history of peptic ulcer disease: Secondary | ICD-10-CM | POA: Insufficient documentation

## 2013-05-18 MED ORDER — AMOXICILLIN 500 MG PO CAPS: 500.0000 mg | ORAL_CAPSULE | Freq: Three times a day (TID) | ORAL | Status: DC

## 2013-05-18 MED ORDER — FLUTICASONE PROPIONATE 50 MCG/ACT NA SUSP: 2.0000 | Freq: Two times a day (BID) | NASAL | Status: DC

## 2013-05-18 NOTE — ED Provider Notes (Signed)
CSN: [id number]     Arrival date & time 05/18/13  4098 History   First MD Initiated Contact with Patient 05/18/13 [phone]     Chief Complaint  Patient presents with  . Facial Pain   (Consider location/radiation/quality/duration/timing/severity/associated sxs/prior Treatment) HPI Comments: Patient states he has had sinus problems for the past year and been treated multiple times for sinusitis.  Using flonase, afrin, claritin D. For the past 5 days, he has had worsening facial pressure and pain.  Denies SOB, chest pain, fever, vision change, weakness, numbness, tingling. He feels that he has congestion in his face that he cannot get out. Good by mouth intake and urine output. No vomiting or nausea. No fever. Has not seen ENT. Last antibiotics in June.  The history is provided by the patient.    Past Medical History  Diagnosis Date  . Stomach ulcer    Past Surgical History  Procedure Laterality Date  . Hernia repair     History reviewed. No pertinent family history. History  Substance Use Topics  . Smoking status: Current Every Day Smoker  . Smokeless tobacco: Never Used  . Alcohol Use: Yes    Review of Systems  Constitutional: Negative for fever, activity change and appetite change.  HENT: Positive for congestion and sinus pressure. Negative for sore throat, rhinorrhea and trouble swallowing.   Respiratory: Negative for cough, chest tightness and shortness of breath.   Gastrointestinal: Negative for nausea, vomiting and abdominal pain.  Genitourinary: Negative for dysuria and hematuria.  Musculoskeletal: Negative for back pain.  Skin: Negative for rash.  Neurological: Negative for dizziness, weakness and headaches.  A complete 10 system review of systems was obtained and all systems are negative except as noted in the HPI and PMH.    Allergies  Review of patient's allergies indicates no known allergies.  Home Medications   Current Outpatient Rx  Name  Route  Sig  Dispense   Refill  . acetaminophen (TYLENOL) 325 MG tablet   Oral   Take 650 mg by mouth every 6 (six) hours as needed for pain.         Marland Kitchen aspirin EC 81 MG tablet   Oral   Take 81 mg by mouth daily.         Jannetta Quint SALINE NASAL NA   Nasal   Place 1 spray into the nose daily as needed.         . cetirizine (ZYRTEC) 10 MG tablet   Oral   Take 10 mg by mouth daily.         Marland Kitchen amoxicillin (AMOXIL) 500 MG capsule   Oral   Take 1 capsule (500 mg total) by mouth 3 (three) times daily.   21 capsule   0   . fluticasone (FLONASE) 50 MCG/ACT nasal spray   Nasal   Place 2 sprays into the nose 2 (two) times daily.   16 g   1    BP 137/98  Pulse 54  Temp(Src) 97.8 F (36.6 C) (Oral)  Resp 16  SpO2 100% Physical Exam  Constitutional: He is oriented to person, place, and time. He appears well-developed and well-nourished. No distress.  HENT:  Head: Normocephalic and atraumatic.  Right Ear: External ear normal.  Left Ear: External ear normal.  Mouth/Throat: Oropharynx is clear and moist. No oropharyngeal exudate.  OP clear.  Minimal frontal and maxillary sinus tenderness.   Eyes: Conjunctivae and EOM are normal. Pupils are equal, round, and reactive to light.  Neck: Normal range of motion. Neck supple.  No meningismus  Cardiovascular: Normal rate, regular rhythm and normal heart sounds.   No murmur heard. Pulmonary/Chest: Effort normal and breath sounds normal. No respiratory distress.  Abdominal: Soft. There is no tenderness. There is no rebound and no guarding.  Musculoskeletal: Normal range of motion. He exhibits no edema and no tenderness.  Neurological: He is alert and oriented to person, place, and time. No cranial nerve deficit. He exhibits normal muscle tone. Coordination normal.  CN 2-12 intact, no ataxia on finger to nose, no nystagmus, 5/5 strength throughout, no pronator drift, Romberg negative, normal gait.   Skin: Skin is warm.    ED Course  Procedures (including  critical care time) Labs Review Labs Reviewed - No data to display Imaging Review No results found.  MDM   1. Sinus pain    Acute on chronic sinus pain. Vital stable. No distress. No fever.  Minimal sinus tenderness with clear oropharynx. Will restart Flonase nasal spray, continue decongestants. We'll give course of antibiotics. Smoking cessation encouraged. ENT followup given.    Glynn Octave, MD 05/18/13 [phone]

## 2013-05-18 NOTE — ED Notes (Signed)
Per pt sts 6 months of facial pain due to sinus pressure. sts just not better. sts he did take abx and helped a little. Denies fever.

## 2013-06-09 ENCOUNTER — Emergency Department (HOSPITAL_COMMUNITY): Payer: No Typology Code available for payment source

## 2013-06-09 ENCOUNTER — Emergency Department (HOSPITAL_COMMUNITY)
Admission: EM | Admit: 2013-06-09 | Discharge: 2013-06-09 | Disposition: A | Discharge status: Home / Self Care | Payer: No Typology Code available for payment source | Attending: Emergency Medicine | Admitting: Emergency Medicine

## 2013-06-09 ENCOUNTER — Encounter (HOSPITAL_COMMUNITY): Payer: Self-pay | Admitting: *Deleted

## 2013-06-09 DIAGNOSIS — R0989 Other specified symptoms and signs involving the circulatory and respiratory systems: Secondary | ICD-10-CM | POA: Insufficient documentation

## 2013-06-09 DIAGNOSIS — F17200 Nicotine dependence, unspecified, uncomplicated: Secondary | ICD-10-CM | POA: Insufficient documentation

## 2013-06-09 DIAGNOSIS — Z8711 Personal history of peptic ulcer disease: Secondary | ICD-10-CM | POA: Insufficient documentation

## 2013-06-09 DIAGNOSIS — R0600 Dyspnea, unspecified: Secondary | ICD-10-CM

## 2013-06-09 DIAGNOSIS — Z7982 Long term (current) use of aspirin: Secondary | ICD-10-CM | POA: Insufficient documentation

## 2013-06-09 DIAGNOSIS — R51 Headache: Secondary | ICD-10-CM | POA: Insufficient documentation

## 2013-06-09 DIAGNOSIS — Z79899 Other long term (current) drug therapy: Secondary | ICD-10-CM | POA: Insufficient documentation

## 2013-06-09 DIAGNOSIS — R0609 Other forms of dyspnea: Secondary | ICD-10-CM | POA: Insufficient documentation

## 2013-06-09 DIAGNOSIS — R0602 Shortness of breath: Secondary | ICD-10-CM | POA: Insufficient documentation

## 2013-06-09 DIAGNOSIS — J3489 Other specified disorders of nose and nasal sinuses: Secondary | ICD-10-CM | POA: Insufficient documentation

## 2013-06-09 LAB — POCT I-STAT TROPONIN I: Troponin i, poc: 0 ng/mL (ref 0.00–0.08)

## 2013-06-09 LAB — BASIC METABOLIC PANEL
CO2: 26 mEq/L (ref 19–32)
Calcium: 9.2 mg/dL (ref 8.4–10.5)
Chloride: 101 mEq/L (ref 96–112)
Creatinine, Ser: 0.63 mg/dL (ref 0.50–1.35)
Sodium: 138 mEq/L (ref 135–145)

## 2013-06-09 LAB — CBC
MCH: 31.5 pg (ref 26.0–34.0)
MCHC: 35.2 g/dL (ref 30.0–36.0)
MCV: 89.4 fL (ref 78.0–100.0)
Platelets: 232 10*3/uL (ref 150–400)
RBC: 4.73 MIL/uL (ref 4.22–5.81)
RDW: 13.3 % (ref 11.5–15.5)

## 2013-06-09 MED ORDER — PREDNISONE 20 MG PO TABS: 40.0000 mg | ORAL_TABLET | Freq: Every day | ORAL | Status: AC

## 2013-06-09 MED ORDER — PREDNISONE 20 MG PO TABS
60.0000 mg | ORAL_TABLET | ORAL | Status: AC
  Administered 2013-06-09: 60 mg via ORAL
  Filled 2013-06-09: qty 3

## 2013-06-09 NOTE — ED Notes (Signed)
Pt has been having chronic sinus problems.  Pt is here with sob for the last 2 weeks, no chest pain.  SOB with walking

## 2013-06-09 NOTE — ED Provider Notes (Signed)
CSN: [id number]     Arrival date & time 06/09/13  1341 History   First MD Initiated Contact with Patient 06/09/13 1826     No chief complaint on file.   HPI  Patient presents with dyspnea, ongoing facial discomfort, rhinorrhea. The rhinorrhea and facial discomfort has been present for months, has not improved with OTC medication or Flonase. Over the past 3 days patient has also developed mild dyspnea, with no cough, no fever, no chills. No relief with anything. Patient continues to smoke, does not drink. He has no diagnosis of emphysema/COPD.  Past Medical History  Diagnosis Date  . Stomach ulcer    Past Surgical History  Procedure Laterality Date  . Hernia repair     No family history on file. History  Substance Use Topics  . Smoking status: Current Every Day Smoker  . Smokeless tobacco: Never Used  . Alcohol Use: No     Comment: QUIT    Review of Systems  Constitutional: Negative for fever, activity change and appetite change.  HENT: Positive for congestion and sinus pressure. Negative for sore throat, rhinorrhea and trouble swallowing.   Respiratory: Positive for shortness of breath. Negative for cough and chest tightness.   Cardiovascular: Negative for chest pain, palpitations and leg swelling.  Gastrointestinal: Negative for nausea, vomiting and abdominal pain.  Genitourinary: Negative for dysuria and hematuria.  Musculoskeletal: Negative for back pain.  Skin: Negative for rash.  Neurological: Negative for dizziness, weakness and headaches.    Allergies  Review of patient's allergies indicates no known allergies.  Home Medications   Current Outpatient Rx  Name  Route  Sig  Dispense  Refill  . aspirin EC 81 MG tablet   Oral   Take 81 mg by mouth daily.         Jannetta Quint SALINE NASAL NA   Nasal   Place 1 spray into the nose 3 (three) times daily as needed (congestion).          . cetirizine-pseudoephedrine (ZYRTEC-D) 5-120 MG per tablet   Oral   Take 1  tablet by mouth daily.         . predniSONE (DELTASONE) 20 MG tablet   Oral   Take 2 tablets (40 mg total) by mouth daily.   8 tablet   0    BP 143/100  Pulse 70  Temp(Src) 98.2 F (36.8 C) (Oral)  Resp 14  Wt 145 lb 8 oz (65.998 kg)  SpO2 100% Physical Exam  Constitutional: He is oriented to person, place, and time. He appears well-developed and well-nourished. No distress.  HENT:  Head: Normocephalic and atraumatic.  Right Ear: External ear normal.  Left Ear: External ear normal.  Mouth/Throat: Oropharynx is clear and moist. No oropharyngeal exudate.  OP clear.  Minimal frontal and maxillary sinus tenderness.   Eyes: Conjunctivae and EOM are normal. Pupils are equal, round, and reactive to light.  Neck: Normal range of motion. Neck supple.  No meningismus  Cardiovascular: Normal rate, regular rhythm and normal heart sounds.   No murmur heard. Pulmonary/Chest: Effort normal. No respiratory distress.  Abdominal: Soft. There is no tenderness. There is no rebound and no guarding.  Musculoskeletal: Normal range of motion. He exhibits no edema and no tenderness.  Neurological: He is alert and oriented to person, place, and time. No cranial nerve deficit. He exhibits normal muscle tone. Coordination normal.  CN 2-12 intact, no ataxia on finger to nose, no nystagmus, 5/5 strength throughout, no pronator drift,  Romberg negative, normal gait.   Skin: Skin is warm.    ED Course  Procedures (including critical care time) Labs Review Labs Reviewed  CBC  BASIC METABOLIC PANEL  POCT I-STAT TROPONIN I   Imaging Review Dg Chest 2 View  06/09/2013   CLINICAL DATA:  Shortness of breath for 1 week, smoking history  EXAM: CHEST  2 VIEW  COMPARISON:  None.  FINDINGS: The heart size and vascular pattern are normal. No infiltrate or effusion. There is some degree of hyperinflation suggesting COPD.  IMPRESSION: No acute findings   Electronically Signed   By: Esperanza Heir M.D.   On:  06/09/2013 14:32   and interpreted the x-ray, discussed with the patient. (abnormal lung fields) Dynamic a discussion on possibilities for his ongoing facial discomfort and dyspnea.  Pulse oximetry 100% room air normal   MDM   1. Dyspnea    This patient, generally well-appearing, afebrile, presents with concern dyspnea, ongoing facial discomfort.  On exam he is neurovascularly appropriate, and in no distress.  With the patient's x-ray, which consisted for long-term smoking use them as description of dyspnea, or some concern for inflammatory changes.  Absent opacification, fever, there is low suspicion for occult pneumonia.  Patient was started on a short course of steroids provided resources to obtain a new primary care physician, with plan referral to ENT for his chronic sinusitis.    Gerhard Munch, MD 06/09/13 Ernest[facility] [place]

## 2013-07-08 ENCOUNTER — Other Ambulatory Visit (HOSPITAL_COMMUNITY): Payer: Self-pay | Admitting: Internal Medicine

## 2013-07-08 DIAGNOSIS — R0602 Shortness of breath: Secondary | ICD-10-CM

## 2013-07-15 ENCOUNTER — Ambulatory Visit (HOSPITAL_COMMUNITY)
Admission: RE | Admit: 2013-07-15 | Discharge: 2013-07-15 | Disposition: A | Discharge status: Home / Self Care | Payer: No Typology Code available for payment source | Source: Ambulatory Visit | Attending: Internal Medicine | Admitting: Internal Medicine

## 2013-07-15 ENCOUNTER — Other Ambulatory Visit (HOSPITAL_COMMUNITY): Payer: Self-pay | Admitting: Emergency Medicine

## 2013-07-15 DIAGNOSIS — R0602 Shortness of breath: Secondary | ICD-10-CM | POA: Insufficient documentation

## 2013-07-15 LAB — PULMONARY FUNCTION TEST

## 2013-07-15 MED ORDER — ALBUTEROL SULFATE (5 MG/ML) 0.5% IN NEBU
2.5000 mg | INHALATION_SOLUTION | Freq: Once | RESPIRATORY_TRACT | Status: AC
  Administered 2013-07-15: 2.5 mg via RESPIRATORY_TRACT

## 2013-07-16 ENCOUNTER — Encounter (HOSPITAL_COMMUNITY): Payer: Self-pay | Admitting: Emergency Medicine

## 2013-07-16 ENCOUNTER — Emergency Department (HOSPITAL_COMMUNITY)
Admission: EM | Admit: 2013-07-16 | Discharge: 2013-07-16 | Disposition: A | Discharge status: Home / Self Care | Payer: No Typology Code available for payment source | Attending: Emergency Medicine | Admitting: Emergency Medicine

## 2013-07-16 ENCOUNTER — Emergency Department (HOSPITAL_COMMUNITY): Payer: No Typology Code available for payment source

## 2013-07-16 ENCOUNTER — Other Ambulatory Visit: Payer: Self-pay

## 2013-07-16 DIAGNOSIS — Z8711 Personal history of peptic ulcer disease: Secondary | ICD-10-CM | POA: Insufficient documentation

## 2013-07-16 DIAGNOSIS — R0789 Other chest pain: Secondary | ICD-10-CM | POA: Insufficient documentation

## 2013-07-16 DIAGNOSIS — F17200 Nicotine dependence, unspecified, uncomplicated: Secondary | ICD-10-CM | POA: Insufficient documentation

## 2013-07-16 DIAGNOSIS — R0602 Shortness of breath: Secondary | ICD-10-CM | POA: Insufficient documentation

## 2013-07-16 DIAGNOSIS — R079 Chest pain, unspecified: Secondary | ICD-10-CM

## 2013-07-16 DIAGNOSIS — Z7982 Long term (current) use of aspirin: Secondary | ICD-10-CM | POA: Insufficient documentation

## 2013-07-16 DIAGNOSIS — Z79899 Other long term (current) drug therapy: Secondary | ICD-10-CM | POA: Insufficient documentation

## 2013-07-16 LAB — POCT I-STAT TROPONIN I: Troponin i, poc: 0 ng/mL (ref 0.00–0.08)

## 2013-07-16 LAB — CBC WITH DIFFERENTIAL/PLATELET
Lymphocytes Relative: 41 % (ref 12–46)
Lymphs Abs: 3.4 10*3/uL (ref 0.7–4.0)
MCV: 90 fL (ref 78.0–100.0)
Neutro Abs: 4.1 10*3/uL (ref 1.7–7.7)
Neutrophils Relative %: 49 % (ref 43–77)
Platelets: 240 10*3/uL (ref 150–400)
RBC: 5.08 MIL/uL (ref 4.22–5.81)
WBC: 8.4 10*3/uL (ref 4.0–10.5)

## 2013-07-16 LAB — COMPREHENSIVE METABOLIC PANEL
ALT: 36 U/L (ref 0–53)
Albumin: 4.3 g/dL (ref 3.5–5.2)
BUN: 8 mg/dL (ref 6–23)
Chloride: 100 mEq/L (ref 96–112)
Creatinine, Ser: 0.69 mg/dL (ref 0.50–1.35)
GFR calc non Af Amer: >90 mL/min (ref >90)
Total Bilirubin: 0.5 mg/dL (ref 0.3–1.2)

## 2013-07-16 LAB — TROPONIN I: Troponin I: <0.3 ng/mL (ref <0.30)

## 2013-07-16 MED ORDER — MORPHINE SULFATE 4 MG/ML IJ SOLN
4.0000 mg | Freq: Once | INTRAMUSCULAR | Status: AC
  Administered 2013-07-16: 4 mg via INTRAVENOUS
  Filled 2013-07-16: qty 1

## 2013-07-16 MED ORDER — SODIUM CHLORIDE 0.9 % IV BOLUS (SEPSIS)
1000.0000 mL | Freq: Once | INTRAVENOUS | Status: AC
  Administered 2013-07-16: 1000 mL via INTRAVENOUS

## 2013-07-16 MED ORDER — NITROGLYCERIN 0.4 MG SL SUBL
0.4000 mg | SUBLINGUAL_TABLET | SUBLINGUAL | Status: DC | PRN
  Filled 2013-07-16: qty 25

## 2013-07-16 NOTE — ED Provider Notes (Signed)
CSN: [id number]     Arrival date & time 07/16/13  1418 History   First MD Initiated Contact with Patient 07/16/13 1647     Chief Complaint  Patient presents with  . Chest Pain   (Consider location/radiation/quality/duration/timing/severity/associated sxs/prior Treatment) The history is provided by the patient and medical records.   This is a 57 year old male with past history significant for stomach ulcers, presenting to the ED for left-sided chest pain, onset this morning. Patient states upon waking this morning he felt a sharp, stabbing sensation in his left chest with radiation to his back. He denies any radiation into upper extremities or neck.  Patient denies any associated diaphoresis, numbness or paresthesias of extremities, weakness, or palpitations. Patient has been having shortness of breath for the past week, this is unchanged. He was seen yesterday and had pulmonary function testing performed-- states it went well and he was fine last night.  This is not prior cardiac history. Patient is a daily smoker, but states he has cut back to approx 1-2 cigarettes daily.  Pt took full dose ASA and antacids this morning without resolution of pain.  VS stable on arrival.  Past Medical History  Diagnosis Date  . Stomach ulcer    Past Surgical History  Procedure Laterality Date  . Hernia repair     No family history on file. History  Substance Use Topics  . Smoking status: Current Every Day Smoker  . Smokeless tobacco: Never Used  . Alcohol Use: No     Comment: QUIT    Review of Systems  Cardiovascular: Positive for chest pain.  All other systems reviewed and are negative.    Allergies  Review of patient's allergies indicates no known allergies.  Home Medications   Current Outpatient Rx  Name  Route  Sig  Dispense  Refill  . aspirin EC 81 MG tablet   Oral   Take 81 mg by mouth daily.         Jannetta Quint SALINE NASAL NA   Nasal   Place 1 spray into the nose 3 (three) times  daily as needed (congestion).          . cetirizine-pseudoephedrine (ZYRTEC-D) 5-120 MG per tablet   Oral   Take 1 tablet by mouth daily.          BP 116/81  Pulse 78  Temp(Src) 99.1 F (37.3 C)  Resp 16  SpO2 100%  Physical Exam  Nursing note and vitals reviewed. Constitutional: He is oriented to person, place, and time. He appears well-developed and well-nourished. No distress.  HENT:  Head: Normocephalic and atraumatic.  Mouth/Throat: Oropharynx is clear and moist.  Eyes: Conjunctivae and EOM are normal. Pupils are equal, round, and reactive to light.  Neck: Normal range of motion.  Cardiovascular: Normal rate, regular rhythm and normal heart sounds.   Pulmonary/Chest: Effort normal and breath sounds normal. No respiratory distress. He has no wheezes.  Pectus excavatum; lungs CTAB; pain not reproducible with palpation to chest wall  Abdominal: Soft. Bowel sounds are normal. There is no tenderness. There is no guarding.  Musculoskeletal: Normal range of motion. He exhibits no edema.  Neurological: He is alert and oriented to person, place, and time.  Skin: Skin is warm and dry. He is not diaphoretic.  Psychiatric: He has a normal mood and affect.    ED Course  Procedures (including critical care time)   Date: 07/16/2013  Rate: 75  Rhythm: normal sinus rhythm  QRS Axis: right  Intervals: normal  ST/T Wave abnormalities: normal  Conduction Disutrbances:none  Narrative Interpretation: RAD, incomplete RBBB, no STEMI  Old EKG Reviewed: unchanged   Labs Review Labs Reviewed  COMPREHENSIVE METABOLIC PANEL - Abnormal; Notable for the following:    AST 40 (*)    All other components within normal limits  CBC WITH DIFFERENTIAL  TROPONIN I  POCT I-STAT TROPONIN I   Imaging Review Dg Chest 2 View  07/16/2013   CLINICAL DATA:  Chest pain.  Shortness of breath.  EXAM: CHEST  2 VIEW  COMPARISON:  06/09/2013  FINDINGS: The heart, mediastinal, and hilar contours are  stable and within normal limits. Pulmonary vascularity is normal. Lung volumes appear within normal limits. Mild pectus excavatum deformity is noted. Minimal stable pleural parenchymal scarring at the right lung apex noted. Otherwise, the lungs are clear. No airspace disease, interstitial abnormality, or pleural effusion. No acute bony abnormality is identified.  IMPRESSION: No acute cardiopulmonary disease.   Electronically Signed   By: Britta Mccreedy M.D.   On: 07/16/2013 15:38    EKG Interpretation   None       MDM   1. Chest pain    EKG normal sinus rhythm, right axis deviation with incomplete right bundle branch block.  EKG unchanged from previous. Chest x-ray clear. Initial and delta troponins negative.  At this time i doubt ACS, PE, dissection, or other acute cardiac event.  Pt pain free at time of discharge.  I have instructed patient to continue reducing his smoking habits. He will followup with his primary care physician within the next week to discuss PFT results and today's ED visit. Copies of today's lab and imaging results were provided for PCP review.  Discussed plan with pt, he agreed.  Strict return precautions advised should sx change or worsen.  Discussed pt with Dr. Romeo Apple who personally evaluated pt and agrees with assessment and plan of care.  Garlon Hatchet, PA-C 07/16/13 2056  Garlon Hatchet, PA-C 07/16/13 2056

## 2013-07-16 NOTE — ED Notes (Signed)
Cp started this am left side hurts to take a deep breath no cough states had lung test yesterday

## 2013-07-17 NOTE — ED Provider Notes (Signed)
Medical screening examination/treatment/procedure(s) were conducted as a shared visit with non-physician practitioner(s) and myself.  I personally evaluated the patient during the encounter. I personally reviewed and interpreted the ecg and agree with the PA's interpretation.   I interviewed and examined the patient. Lungs are CTAB. Cardiac exam wnl. Abdomen soft. Pt now asx after pain medicine. I offered admission. He would prefer to go home. Will delta trop and rec outpt workup. Pt happy w/ plan.     Junius Argyle, MD 07/17/13 1346

## 2013-07-25 ENCOUNTER — Encounter: Payer: Self-pay | Admitting: Internal Medicine

## 2013-07-25 ENCOUNTER — Encounter (INDEPENDENT_AMBULATORY_CARE_PROVIDER_SITE_OTHER): Payer: Self-pay

## 2013-07-25 ENCOUNTER — Ambulatory Visit (INDEPENDENT_AMBULATORY_CARE_PROVIDER_SITE_OTHER): Payer: No Typology Code available for payment source | Admitting: Internal Medicine

## 2013-07-25 VITALS — BP 100/70 | HR 72 | Temp 98.2°F | Ht 72.5 in | Wt 148.2 lb

## 2013-07-25 DIAGNOSIS — F17200 Nicotine dependence, unspecified, uncomplicated: Secondary | ICD-10-CM

## 2013-07-25 DIAGNOSIS — J329 Chronic sinusitis, unspecified: Secondary | ICD-10-CM | POA: Insufficient documentation

## 2013-07-25 DIAGNOSIS — Z720 Tobacco use: Secondary | ICD-10-CM | POA: Insufficient documentation

## 2013-07-25 DIAGNOSIS — J449 Chronic obstructive pulmonary disease, unspecified: Secondary | ICD-10-CM

## 2013-07-25 MED ORDER — AMOXICILLIN-POT CLAVULANATE 875-125 MG PO TABS: 1.0000 | ORAL_TABLET | Freq: Two times a day (BID) | ORAL | Status: DC

## 2013-07-25 MED ORDER — PREDNISONE (PAK) 10 MG PO TABS: ORAL_TABLET | ORAL | Status: DC

## 2013-07-25 MED ORDER — BUDESONIDE-FORMOTEROL FUMARATE 160-4.5 MCG/ACT IN AERO: INHALATION_SPRAY | RESPIRATORY_TRACT | Status: DC

## 2013-07-25 NOTE — Progress Notes (Signed)
  Subjective:    Patient ID: [name] [name], male    DOB: 18-Feb-1956   MRN: [id number]  HPI  44 yobm with pna as child/good ex tol despite  activesmoker with no problem with any resp problems until developing sinus problems Dec 2013 which required multiple trips to ER then started with variable sob Sept 2014 referred to pulmonary clinic 07/25/2013 for eval of sob.   07/25/2013 1st [facility] office visit/ [name] cc variable doe x 2 months also at rest with anxiety which gets better s rx after an our, some throat clearing with dark mucus in ams and recurrent severe facial pain that gets better with abx/ flonase then relapses off it.   Best days still gets doe going uphills or in a hurry on a flat grade.   No obvious pattern  day to day or daytime variabilty or assoc cp or chest tightness, subjective wheeze overt  hb symptoms. No unusual exp hx or h/o  asthma or knowledge of premature birth.  Sleeping ok without nocturnal    exacerbation  of respiratory  c/o's or need for noct saba. Also denies any obvious fluctuation of symptoms with weather or environmental changes or other aggravating or alleviating factors except as outlined above   Current Medications, Allergies, Complete Past Medical History, Past Surgical History, Family History, and Social History were reviewed in [employer] record.              Review of Systems  Constitutional: Positive for appetite change. Negative for fever, chills, activity change and unexpected weight change.  HENT: Positive for congestion and dental problem. Negative for postnasal drip, rhinorrhea, sneezing, sore throat, trouble swallowing and voice change.   Eyes: Negative for visual disturbance.  Respiratory: Positive for shortness of breath. Negative for cough and choking.   Cardiovascular: Negative for chest pain and leg swelling.  Gastrointestinal: Negative for nausea, vomiting and abdominal pain.  Genitourinary: Negative  for difficulty urinating.  Musculoskeletal: Negative for arthralgias.  Skin: Negative for rash.  Neurological: Positive for headaches.  Psychiatric/Behavioral: Negative for behavioral problems and confusion.       Objective:   Physical Exam Anxious thin amb bm nad  Wt Readings from Last 3 Encounters:  07/25/13 148 lb 3.2 oz (67.223 kg)  06/09/13 145 lb 8 oz (65.998 kg)  01/29/13 142 lb 9.6 oz (64.683 kg)     HEENT mild turbinate edema.  Oropharynx no thrush or excess pnd or cobblestoning.  No JVD or cervical adenopathy. Mild accessory muscle hypertrophy. Trachea midline, nl thryroid. Chest was hyperinflated by percussion with diminished breath sounds and moderate increased exp time without wheeze. Hoover sign positive at mid inspiration. Regular rate and rhythm without murmur gallop or rub or increase P2 or edema.  Abd: no hsm, nl excursion. Ext warm without cyanosis- mild clubbing.   cxr  07/16/13 No acute cardiopulmonary disease.        Assessment & Plan:

## 2013-07-25 NOTE — Patient Instructions (Signed)
Prednisone 10 mg take  4 each am x 2 days,   2 each am x 2 days,  1 each am x 2 days and stop  Augmentin 875 mg take one pill twice daily  X 10 days - take at breakfast and supper with large glass of water.  It would help reduce the usual side effects (diarrhea and yeast infections) if you ate cultured yogurt at lunch.   symbicort 160 Take 2 puffs first thing in am and then another 2 puffs about 12 hours later if you feel it helps  Work on inhaler technique:  relax and gently blow all the way out then take a nice smooth deep breath back in, triggering the inhaler at same time you start breathing in.  Hold for up to 5 seconds if you can.  Rinse and gargle with water when done  The key is to stop smoking completely before smoking completely stops you!   Please see patient coordinator before you leave today  to schedule sinus CT in 2 weeks (no sooner)  Pulmonary follow up is not needed but we can see you if you don't feel the symbicort is helping

## 2013-07-25 NOTE — Assessment & Plan Note (Signed)
rx augmentin/ pred then check sinus ct in 2 weeks > ent referral prn

## 2013-07-25 NOTE — Assessment & Plan Note (Addendum)
-   PFT's 07/15/13 FEV1  2.72 (78%) ratio 67 and no change p B2 and DLCO 75 corrects to 92%  - [facility] 07/25/2013 75% p ext coaching   DDX of  difficult airways managment all start with A and  include Adherence, Ace Inhibitors, Acid Reflux, Active Sinus Disease, Alpha 1 Antitripsin deficiency, Anxiety masquerading as Airways dz,  ABPA,  allergy(esp in young), Aspiration (esp in elderly), Adverse effects of DPI,  Active smokers, plus two Bs  = Bronchiectasis and Beta blocker use..and one C= CHF  Adherence is always the initial "prime suspect" and is a multilayered concern that requires a "trust but verify" approach in every patient - starting with knowing how to use medications, especially inhalers, correctly, keeping up with refills and understanding the fundamental difference between maintenance and prns vs those medications only taken for a very short course and then stopped and not refilled.  - The proper method of use, as well as anticipated side effects, of a metered-dose inhaler are discussed and demonstrated to the patient. Improved effectiveness after extensive coaching during this visit to a level of approximately  75% so try symbicort 160 2bid  Active smoking greatest concern > see smoking a/p   Active/ acute sinus dz > see sinusitis a/p  Anxiety > typically a dx of exclusion but the sob that occurs at rest in the absence of cough and resolves spontaneously is strongly suggestive > defer Rx to primary svc    Each maintenance medication was reviewed in detail including most importantly the difference between maintenance and as needed and under what circumstances the prns are to be used.  Please see instructions for details which were reviewed in writing and the patient given a copy.

## 2013-07-25 NOTE — Assessment & Plan Note (Signed)

## 2013-08-08 ENCOUNTER — Ambulatory Visit (INDEPENDENT_AMBULATORY_CARE_PROVIDER_SITE_OTHER)
Admission: RE | Admit: 2013-08-08 | Discharge: 2013-08-08 | Disposition: A | Discharge status: Home / Self Care | Payer: No Typology Code available for payment source | Source: Ambulatory Visit | Attending: Internal Medicine | Admitting: Internal Medicine

## 2013-08-08 ENCOUNTER — Encounter: Payer: Self-pay | Admitting: Internal Medicine

## 2013-08-08 ENCOUNTER — Telehealth: Payer: Self-pay | Admitting: Internal Medicine

## 2013-08-08 DIAGNOSIS — J329 Chronic sinusitis, unspecified: Secondary | ICD-10-CM

## 2013-08-08 NOTE — Telephone Encounter (Signed)
Pt has already been made aware.

## 2013-09-05 ENCOUNTER — Ambulatory Visit: Payer: No Typology Code available for payment source | Admitting: Internal Medicine

## 2013-10-27 ENCOUNTER — Encounter (HOSPITAL_COMMUNITY): Payer: Self-pay | Admitting: Emergency Medicine

## 2013-10-27 ENCOUNTER — Emergency Department (HOSPITAL_COMMUNITY)
Admission: EM | Admit: 2013-10-27 | Discharge: 2013-10-27 | Disposition: A | Discharge status: Home / Self Care | Payer: No Typology Code available for payment source | Attending: Emergency Medicine | Admitting: Emergency Medicine

## 2013-10-27 DIAGNOSIS — F17200 Nicotine dependence, unspecified, uncomplicated: Secondary | ICD-10-CM | POA: Insufficient documentation

## 2013-10-27 DIAGNOSIS — Z792 Long term (current) use of antibiotics: Secondary | ICD-10-CM | POA: Insufficient documentation

## 2013-10-27 DIAGNOSIS — IMO0002 Reserved for concepts with insufficient information to code with codable children: Secondary | ICD-10-CM | POA: Insufficient documentation

## 2013-10-27 DIAGNOSIS — J329 Chronic sinusitis, unspecified: Secondary | ICD-10-CM | POA: Insufficient documentation

## 2013-10-27 DIAGNOSIS — H5789 Other specified disorders of eye and adnexa: Secondary | ICD-10-CM | POA: Insufficient documentation

## 2013-10-27 DIAGNOSIS — Z8719 Personal history of other diseases of the digestive system: Secondary | ICD-10-CM | POA: Insufficient documentation

## 2013-10-27 NOTE — ED Notes (Signed)
C/O chronic sinus pressure. It used to come and go but is now constant.

## 2013-10-27 NOTE — ED Notes (Signed)
Pt c/o sinus and facial pain "ongoing for awhile." pt unsure if he has had a fever. Pt talking in complete sentences.

## 2013-10-27 NOTE — ED Notes (Signed)
Working with Solmon Ice regarding [place] card.

## 2013-10-27 NOTE — Discharge Instructions (Signed)

## 2013-10-27 NOTE — ED Provider Notes (Signed)
CSN: [id number]     Arrival date & time 10/27/13  3825 History  This chart was scribed for non-physician practitioner, Montine Circle, PA-C working with Maudry Diego, MD by Frederich Balding, ED scribe. This patient was seen in room TR07C/TR07C and the patient's care was started at 10:21 AM.    Chief Complaint  Patient presents with  . Facial Pain   The history is provided by the patient. No language interpreter was used.   HPI Comments: [name] [name] is a 58 y.o. male who presents to the Emergency Department complaining of intermittent maxillary sinus pressure that started over one year ago. Pt states it has caused his eyes to have a watery discharge. He has taken tylenol with temporary relief.   Past Medical History  Diagnosis Date  . Stomach ulcer    Past Surgical History  Procedure Laterality Date  . Hernia repair     No family history on file. History  Substance Use Topics  . Smoking status: Current Every Day Smoker -- 0.50 packs/day for 30 years    Types: Cigarettes  . Smokeless tobacco: Never Used  . Alcohol Use: No     Comment: QUIT    Review of Systems  Constitutional: Negative for fever.  HENT: Positive for sinus pressure.   Eyes: Positive for discharge.  Musculoskeletal: Negative for gait problem.  Skin: Negative for wound.  Neurological: Negative for speech difficulty.  Psychiatric/Behavioral: Negative for confusion.   Allergies  Review of patient's allergies indicates no known allergies.  Home Medications   Current Outpatient Rx  Name  Route  Sig  Dispense  Refill  . amoxicillin-clavulanate (AUGMENTIN) 875-125 MG per tablet   Oral   Take 1 tablet by mouth 2 (two) times daily.   20 tablet   0   . budesonide-formoterol (SYMBICORT) 160-4.5 MCG/ACT inhaler      Take 2 puffs first thing in am and then another 2 puffs about 12 hours later.   1 Inhaler   12   . Ca Carbonate-Mag Hydroxide (ROLAIDS PO)   Oral   Take 1 tablet by mouth as needed (for  heartburn).         . calcium carbonate (TUMS EX) 750 MG chewable tablet   Oral   Chew 1 tablet by mouth as needed for heartburn.         . predniSONE (STERAPRED UNI-PAK) 10 MG tablet      Prednisone 10 mg take  4 each am x 2 days,   2 each am x 2 days,  1 each am x2days and stop   14 tablet   0   . pseudoephedrine (SUDAFED) 30 MG tablet   Oral   Take 30 mg by mouth every 4 (four) hours as needed for congestion.          BP 108/74  Pulse 72  Temp(Src) 97.5 F (36.4 C) (Oral)  Resp 18  Ht 6' 1.5" (1.867 m)  Wt 150 lb (68.04 kg)  BMI 19.52 kg/m2  SpO2 97%  Physical Exam  Nursing note and vitals reviewed. Constitutional: He is oriented to person, place, and time. He appears well-developed. No distress.  HENT:  Head: Normocephalic and atraumatic.  Right Ear: Tympanic membrane normal.  Left Ear: Tympanic membrane normal.  Nose: Right sinus exhibits no maxillary sinus tenderness and no frontal sinus tenderness. Left sinus exhibits no maxillary sinus tenderness and no frontal sinus tenderness.  Eyes: Conjunctivae and EOM are normal.  No evidence of entrapment.  Cardiovascular: Normal rate and regular rhythm.   Pulmonary/Chest: Effort normal. No stridor. No respiratory distress.  Abdominal: He exhibits no distension.  Musculoskeletal: He exhibits no edema.  Neurological: He is alert and oriented to person, place, and time.  Skin: Skin is warm and dry.  Psychiatric: He has a normal mood and affect.    ED Course  Procedures (including critical care time)  DIAGNOSTIC STUDIES: Oxygen Saturation is 97% on RA, normal by my interpretation.    COORDINATION OF CARE: 10:28 AM-Discussed treatment plan which includes follow up with ENT with pt at bedside and pt agreed to plan. Return precautions given to pt.   Labs Review Labs Reviewed - No data to display Imaging Review No results found.  EKG Interpretation   None       MDM   Final diagnoses:  Sinusitis,  chronic    Patient with chronic sinusitis. He states that he still feels sinus pressure. He is been having the problem for the past year. He is tried many prescription and OTC medications with no relief. I recommend ENT followup. He had a CT scan of his sinuses in December of 2014 which was negative for tumor or other emergent process. Will discharge to home with ENT followup.  I personally performed the services described in this documentation, which was scribed in my presence. The recorded information has been reviewed and is accurate.    Montine Circle, PA-C 10/27/13 1034

## 2013-10-27 NOTE — ED Provider Notes (Signed)
Medical screening examination/treatment/procedure(s) were performed by non-physician practitioner and as supervising physician I was immediately available for consultation/collaboration.  EKG Interpretation   None         Maudry Diego, MD 10/27/13 1520

## 2013-11-12 ENCOUNTER — Encounter (HOSPITAL_COMMUNITY): Payer: Self-pay | Admitting: Emergency Medicine

## 2013-11-12 ENCOUNTER — Emergency Department (HOSPITAL_COMMUNITY)
Admission: EM | Admit: 2013-11-12 | Discharge: 2013-11-13 | Disposition: A | Discharge status: Home / Self Care | Payer: No Typology Code available for payment source | Attending: Emergency Medicine | Admitting: Emergency Medicine

## 2013-11-12 ENCOUNTER — Emergency Department (HOSPITAL_COMMUNITY): Payer: No Typology Code available for payment source

## 2013-11-12 DIAGNOSIS — Z8711 Personal history of peptic ulcer disease: Secondary | ICD-10-CM | POA: Insufficient documentation

## 2013-11-12 DIAGNOSIS — R110 Nausea: Secondary | ICD-10-CM | POA: Insufficient documentation

## 2013-11-12 DIAGNOSIS — R143 Flatulence: Secondary | ICD-10-CM

## 2013-11-12 DIAGNOSIS — R1031 Right lower quadrant pain: Secondary | ICD-10-CM | POA: Insufficient documentation

## 2013-11-12 DIAGNOSIS — R109 Unspecified abdominal pain: Secondary | ICD-10-CM

## 2013-11-12 DIAGNOSIS — R142 Eructation: Secondary | ICD-10-CM | POA: Insufficient documentation

## 2013-11-12 DIAGNOSIS — Z9889 Other specified postprocedural states: Secondary | ICD-10-CM | POA: Insufficient documentation

## 2013-11-12 DIAGNOSIS — R141 Gas pain: Secondary | ICD-10-CM | POA: Insufficient documentation

## 2013-11-12 DIAGNOSIS — F17200 Nicotine dependence, unspecified, uncomplicated: Secondary | ICD-10-CM | POA: Insufficient documentation

## 2013-11-12 LAB — CBC WITH DIFFERENTIAL/PLATELET
BASOS ABS: 0 10*3/uL (ref 0.0–0.1)
Basophils Relative: 0 % (ref 0–1)
Eosinophils Absolute: 0.3 10*3/uL (ref 0.0–0.7)
Eosinophils Relative: 3 % (ref 0–5)
HEMATOCRIT: 43.5 % (ref 39.0–52.0)
HEMOGLOBIN: 16.1 g/dL (ref 13.0–17.0)
LYMPHS PCT: 42 % (ref 12–46)
Lymphs Abs: 3.9 10*3/uL (ref 0.7–4.0)
MCH: 33.1 pg (ref 26.0–34.0)
MCHC: 37 g/dL — ABNORMAL HIGH (ref 30.0–36.0)
MCV: 89.3 fL (ref 78.0–100.0)
MONO ABS: 0.8 10*3/uL (ref 0.1–1.0)
MONOS PCT: 8 % (ref 3–12)
NEUTROS ABS: 4.2 10*3/uL (ref 1.7–7.7)
Neutrophils Relative %: 46 % (ref 43–77)
Platelets: 254 10*3/uL (ref 150–400)
RBC: 4.87 MIL/uL (ref 4.22–5.81)
RDW: 13.2 % (ref 11.5–15.5)
WBC: 9.3 10*3/uL (ref 4.0–10.5)

## 2013-11-12 LAB — URINALYSIS, ROUTINE W REFLEX MICROSCOPIC
Bilirubin Urine: NEGATIVE
GLUCOSE, UA: NEGATIVE mg/dL
Hgb urine dipstick: NEGATIVE
Ketones, ur: NEGATIVE mg/dL
Leukocytes, UA: NEGATIVE
Nitrite: NEGATIVE
PH: 6.5 (ref 5.0–8.0)
Protein, ur: NEGATIVE mg/dL
Specific Gravity, Urine: 1.007 (ref 1.005–1.030)
Urobilinogen, UA: 0.2 mg/dL (ref 0.0–1.0)

## 2013-11-12 LAB — COMPREHENSIVE METABOLIC PANEL
ALT: 49 U/L (ref 0–53)
AST: 52 U/L — AB (ref 0–37)
Albumin: 4.1 g/dL (ref 3.5–5.2)
Alkaline Phosphatase: 73 U/L (ref 39–117)
BILIRUBIN TOTAL: 0.6 mg/dL (ref 0.3–1.2)
BUN: 9 mg/dL (ref 6–23)
CHLORIDE: 99 meq/L (ref 96–112)
CO2: 24 mEq/L (ref 19–32)
CREATININE: 0.61 mg/dL (ref 0.50–1.35)
Calcium: 9.7 mg/dL (ref 8.4–10.5)
GFR calc Af Amer: >90 mL/min (ref >90)
Glucose, Bld: 81 mg/dL (ref 70–99)
Potassium: 4.2 mEq/L (ref 3.7–5.3)
Sodium: 140 mEq/L (ref 137–147)
Total Protein: 7.7 g/dL (ref 6.0–8.3)

## 2013-11-12 LAB — LIPASE, BLOOD: Lipase: 17 U/L (ref 11–59)

## 2013-11-12 MED ORDER — MORPHINE SULFATE 4 MG/ML IJ SOLN
4.0000 mg | Freq: Once | INTRAMUSCULAR | Status: DC
  Filled 2013-11-12: qty 1

## 2013-11-12 MED ORDER — IOHEXOL 300 MG/ML  SOLN
25.0000 mL | Freq: Once | INTRAMUSCULAR | Status: AC | PRN
  Administered 2013-11-12: 25 mL via ORAL

## 2013-11-12 MED ORDER — IOHEXOL 300 MG/ML  SOLN
100.0000 mL | Freq: Once | INTRAMUSCULAR | Status: AC | PRN
  Administered 2013-11-12: 100 mL via INTRAVENOUS

## 2013-11-12 MED ORDER — OXYMETAZOLINE HCL 0.05 % NA SOLN
1.0000 | Freq: Once | NASAL | Status: AC
  Administered 2013-11-13: 1 via NASAL
  Filled 2013-11-12: qty 15

## 2013-11-12 NOTE — ED Notes (Signed)
Pt denies any pain and requests only nasal spray for his sinuses.

## 2013-11-12 NOTE — ED Notes (Signed)
Nurse First Rounds : Nurse explained ealy / process and wait time to pt.

## 2013-11-12 NOTE — ED Provider Notes (Signed)
CSN: [id number]     Arrival date & time 11/12/13  1700 History   First MD Initiated Contact with Patient 11/12/13 2204     Chief Complaint  Patient presents with  . Abdominal Pain     (Consider location/radiation/quality/duration/timing/severity/associated sxs/prior Treatment) The history is provided by the patient and medical records.   This is a 58 year old male with past medical history significant for gastric ulcers, presenting to the ED for abdominal pain.  Patient states over the past several days he's been having early satiety, bloating after eating, and some mild nausea.  Denies vomiting.  Last BM yesterday which was normal.  No melena. Patient also notes his urine has appeared dark in color without odor. He denies any dysuria. No flank pain, fevers, or chills. Prior abdominal surgeries include exploratory laparotomy and prior hernia repair.  VS stable on arrival.  Past Medical History  Diagnosis Date  . Stomach ulcer    Past Surgical History  Procedure Laterality Date  . Hernia repair     History reviewed. No pertinent family history. History  Substance Use Topics  . Smoking status: Current Every Day Smoker -- 0.50 packs/day for 30 years    Types: Cigarettes  . Smokeless tobacco: Never Used  . Alcohol Use: No     Comment: QUIT    Review of Systems  Gastrointestinal: Positive for nausea and abdominal pain.       Bloating  All other systems reviewed and are negative.      Allergies  Review of patient's allergies indicates no known allergies.  Home Medications   Current Outpatient Rx  Name  Route  Sig  Dispense  Refill  . acetaminophen (TYLENOL) 500 MG tablet   Oral   Take 500 mg by mouth every 6 (six) hours as needed for mild pain.          BP 142/84  Pulse 78  Temp(Src) 98.4 F (36.9 C) (Oral)  Resp 14  SpO2 98%  Physical Exam  Nursing note and vitals reviewed. Constitutional: He is oriented to person, place, and time. He appears well-developed  and well-nourished. No distress.  HENT:  Head: Normocephalic and atraumatic.  Mouth/Throat: Oropharynx is clear and moist.  Eyes: Conjunctivae and EOM are normal. Pupils are equal, round, and reactive to light.  Neck: Normal range of motion. Neck supple.  Cardiovascular: Normal rate, regular rhythm and normal heart sounds.   Pulmonary/Chest: Effort normal and breath sounds normal. No respiratory distress. He has no wheezes.  Abdominal: Soft. Bowel sounds are normal. There is tenderness in the right lower quadrant. There is no guarding and no tenderness at McBurney's point.    Abdomen soft, non-distended, mild TTP RLQ with negative McBurney's point tenderness  Musculoskeletal: Normal range of motion. He exhibits no edema.  Neurological: He is alert and oriented to person, place, and time.  Skin: Skin is warm and dry. He is not diaphoretic.  Psychiatric: He has a normal mood and affect.    ED Course  Procedures (including critical care time) Labs Review Labs Reviewed  CBC WITH DIFFERENTIAL - Abnormal; Notable for the following:    MCHC 37.0 (*)    All other components within normal limits  COMPREHENSIVE METABOLIC PANEL - Abnormal; Notable for the following:    AST 52 (*)    All other components within normal limits  URINALYSIS, ROUTINE W REFLEX MICROSCOPIC  LIPASE, BLOOD   Imaging Review Ct Abdomen Pelvis W Contrast  11/13/2013   CLINICAL DATA Abdominal pain  EXAM CT ABDOMEN AND PELVIS WITH CONTRAST  TECHNIQUE Multidetector CT imaging of the abdomen and pelvis was performed using the standard protocol following bolus administration of intravenous contrast.  CONTRAST 152mL OMNIPAQUE IOHEXOL 300 MG/ML  SOLN  COMPARISON None.  FINDINGS Linear opacity within the middle lobe, favor atelectasis or scarring. Heart size upper normal to mildly enlarged.  Decreased hepatic attenuation. Wedge-shaped area of sparing right hepatic lobe image 19. No radiodense gallstones or biliary ductal  dilatation. No appreciable abnormality of the spleen, pancreas, adrenal glands. A couple mildly prominent porta hepatis/ peripancreatic lymph nodes measure up to a cm short axis. Symmetric renal enhancement. No hydroureteronephrosis.  No overt colitis. Normal appendix. No bowel obstruction. No free intraperitoneal air or fluid.  Normal caliber aorta and branch vessels.  Thin walled bladder. Normal size prostate gland. Nonspecific prostatic calcifications.  Nonspecific multifocal areas of lucency within the iliac bones. Degenerative disc disease at L4-5 and L5-S1.  IMPRESSION Hepatic steatosis. Mildly prominent porta hepatis lymph nodes, likely reactive.  Multifocal lucencies within the iliac bones. Myeloma or metastases not excluded.  SIGNATURE  Electronically Signed   By: Carlos Levering M.D.   On: 11/13/2013 00:20     EKG Interpretation None      MDM   Final diagnoses:  Abdominal pain   Labs as above, largely WNL.  U/a clean.  CT abdomen pelvis revealing multifocal lucencies within the iliac bones, possible myeloma versus metastasis.  I discussed with pt at length that these lucencies on CT scan may represent cancer, patient acknowledged understanding and agreed to followup with his primary care physician within one week.  Pt discharged in stable condition.  Larene Pickett, PA-C 11/13/13 0107

## 2013-11-12 NOTE — ED Notes (Signed)
Pt in c/o abd pain and changes in urine color, denies pain with urination, states his stomach feels tight when he drinks anything, no distress noted, denies other symptoms

## 2013-11-13 NOTE — Discharge Instructions (Signed)
Follow up with your primary care physician regarding CT scan findings today.  It is important that you follow-up as soon as possible as we do not specifically know what these bony abnormalities represent. Return to the ED for new or worsening symptoms.

## 2013-11-24 NOTE — ED Provider Notes (Signed)
Medical screening examination/treatment/procedure(s) were performed by non-physician practitioner and as supervising physician I was immediately available for consultation/collaboration.   Shaday Rayborn L Elenora Hawbaker, MD 11/24/13 1119 

## 2014-02-09 ENCOUNTER — Other Ambulatory Visit (HOSPITAL_COMMUNITY): Payer: Self-pay | Admitting: Gastroenterology

## 2014-02-09 DIAGNOSIS — R1031 Right lower quadrant pain: Secondary | ICD-10-CM

## 2014-02-09 DIAGNOSIS — R7989 Other specified abnormal findings of blood chemistry: Secondary | ICD-10-CM

## 2014-02-09 DIAGNOSIS — B1710 Acute hepatitis C without hepatic coma: Secondary | ICD-10-CM

## 2014-02-09 DIAGNOSIS — R52 Pain, unspecified: Secondary | ICD-10-CM

## 2014-02-17 ENCOUNTER — Ambulatory Visit (HOSPITAL_COMMUNITY)
Admission: RE | Admit: 2014-02-17 | Discharge: 2014-02-17 | Disposition: A | Discharge status: Home / Self Care | Payer: No Typology Code available for payment source | Source: Ambulatory Visit | Attending: Gastroenterology | Admitting: Gastroenterology

## 2014-02-17 DIAGNOSIS — N433 Hydrocele, unspecified: Secondary | ICD-10-CM | POA: Insufficient documentation

## 2014-02-17 DIAGNOSIS — R7989 Other specified abnormal findings of blood chemistry: Secondary | ICD-10-CM

## 2014-02-17 DIAGNOSIS — R1031 Right lower quadrant pain: Secondary | ICD-10-CM

## 2014-02-17 DIAGNOSIS — B1710 Acute hepatitis C without hepatic coma: Secondary | ICD-10-CM

## 2014-02-17 DIAGNOSIS — R109 Unspecified abdominal pain: Secondary | ICD-10-CM | POA: Insufficient documentation

## 2014-03-12 ENCOUNTER — Encounter (HOSPITAL_COMMUNITY): Payer: Self-pay | Admitting: Emergency Medicine

## 2014-03-12 ENCOUNTER — Emergency Department (INDEPENDENT_AMBULATORY_CARE_PROVIDER_SITE_OTHER)
Admission: EM | Admit: 2014-03-12 | Discharge: 2014-03-12 | Disposition: A | Discharge status: Home / Self Care | Payer: No Typology Code available for payment source | Source: Home / Self Care | Attending: Emergency Medicine | Admitting: Emergency Medicine

## 2014-03-12 ENCOUNTER — Emergency Department (INDEPENDENT_AMBULATORY_CARE_PROVIDER_SITE_OTHER): Payer: No Typology Code available for payment source

## 2014-03-12 DIAGNOSIS — J329 Chronic sinusitis, unspecified: Secondary | ICD-10-CM

## 2014-03-12 DIAGNOSIS — J449 Chronic obstructive pulmonary disease, unspecified: Secondary | ICD-10-CM

## 2014-03-12 DIAGNOSIS — F17200 Nicotine dependence, unspecified, uncomplicated: Secondary | ICD-10-CM

## 2014-03-12 MED ORDER — ALBUTEROL SULFATE HFA 108 (90 BASE) MCG/ACT IN AERS: 1.0000 | INHALATION_SPRAY | Freq: Four times a day (QID) | RESPIRATORY_TRACT | Status: DC | PRN

## 2014-03-12 MED ORDER — ALBUTEROL SULFATE (2.5 MG/3ML) 0.083% IN NEBU
INHALATION_SOLUTION | RESPIRATORY_TRACT | Status: AC
  Filled 2014-03-12: qty 6

## 2014-03-12 MED ORDER — IPRATROPIUM BROMIDE 0.02 % IN SOLN
0.5000 mg | Freq: Once | RESPIRATORY_TRACT | Status: AC
  Administered 2014-03-12: 0.5 mg via RESPIRATORY_TRACT

## 2014-03-12 MED ORDER — IPRATROPIUM BROMIDE 0.06 % NA SOLN: 2.0000 | Freq: Four times a day (QID) | NASAL | Status: DC

## 2014-03-12 MED ORDER — ALBUTEROL SULFATE (2.5 MG/3ML) 0.083% IN NEBU
5.0000 mg | INHALATION_SOLUTION | Freq: Once | RESPIRATORY_TRACT | Status: AC
  Administered 2014-03-12: 5 mg via RESPIRATORY_TRACT

## 2014-03-12 MED ORDER — IPRATROPIUM BROMIDE 0.02 % IN SOLN
RESPIRATORY_TRACT | Status: AC
  Filled 2014-03-12: qty 2.5

## 2014-03-12 NOTE — ED Notes (Signed)
C/o sob x 1 wk.  No relief with inhalers.  No hx of asthma.  States recently getting over a bad sinus infection.  Still having a lot of post nasal drip.   Denies fever, n/v/d

## 2014-03-12 NOTE — ED Provider Notes (Signed)
CSN: [id number]     Arrival date & time 03/12/14  0818 History   First MD Initiated Contact with Patient 03/12/14 0825     Chief Complaint  Patient presents with  . Shortness of Breath   (Consider location/radiation/quality/duration/timing/severity/associated sxs/prior Treatment) HPI Comments: Is patient of [facility] and has been diagnosed with COPD. Has not been taking Symbicort on daily basis. He understood that medication was only to be used on as needed basis. Does not have rescue bronchodilator medication such as albuterol.   Patient is a 58 y.o. male presenting with shortness of breath. The history is provided by the patient.  Shortness of Breath Severity:  Mild Onset quality:  Gradual Duration:  1 week Timing:  Constant Progression:  Worsening Chronicity:  New Relieved by:  Nothing Ineffective treatments: Has tried using flonase, claritin, symbicort with little relief. Associated symptoms: PND   Associated symptoms: no abdominal pain, no chest pain, no claudication, no cough, no diaphoresis, no ear pain, no fever, no headaches, no hemoptysis, no neck pain, no rash, no sore throat, no sputum production, no syncope, no swollen glands, no vomiting and no wheezing   Risk factors: no recent alcohol use, no hx of PE/DVT, no prolonged immobilization and no recent surgery   Risk factors comment:  +former smoker, quit Dec. 2015   Past Medical History  Diagnosis Date  . Stomach ulcer    Past Surgical History  Procedure Laterality Date  . Hernia repair     History reviewed. No pertinent family history. History  Substance Use Topics  . Smoking status: Current Every Day Smoker -- 0.50 packs/day for 30 years    Types: Cigarettes  . Smokeless tobacco: Never Used  . Alcohol Use: No     Comment: QUIT    Review of Systems  Constitutional: Negative for fever and diaphoresis.  HENT: Positive for congestion, postnasal drip, rhinorrhea and sinus pressure. Negative for ear  pain, mouth sores, nosebleeds, sore throat, tinnitus, trouble swallowing and voice change.   Respiratory: Positive for shortness of breath. Negative for cough, hemoptysis, sputum production, chest tightness and wheezing.   Cardiovascular: Positive for PND. Negative for chest pain, palpitations, claudication, leg swelling and syncope.  Gastrointestinal: Negative for nausea, vomiting, abdominal pain and diarrhea.  Genitourinary: Negative.   Musculoskeletal: Negative.  Negative for neck pain.  Skin: Negative.  Negative for rash.  Neurological: Negative.  Negative for headaches.    Allergies  Review of patient's allergies indicates no known allergies.  Home Medications   Prior to Admission medications   Medication Sig Start Date End Date Taking? Authorizing Provider  acetaminophen (TYLENOL) 500 MG tablet Take 500 mg by mouth every 6 (six) hours as needed for mild pain.    Historical Provider, MD  albuterol (PROVENTIL HFA;VENTOLIN HFA) 108 (90 BASE) MCG/ACT inhaler Inhale 1-2 puffs into the lungs every 6 (six) hours as needed for wheezing or shortness of breath. 03/12/14   Lahoma Rocker, PA  ipratropium (ATROVENT) 0.06 % nasal spray Place 2 sprays into both nostrils 4 (four) times daily. 03/12/14   Annett Gula Tiffany Talarico, PA   BP 118/87  Pulse 78  Temp(Src) 98.3 F (36.8 C) (Oral)  Resp 18  SpO2 100% Physical Exam  Nursing note and vitals reviewed. Constitutional: He is oriented to person, place, and time. He appears well-developed and well-nourished. No distress.  HENT:  Head: Normocephalic and atraumatic.  Right Ear: Hearing, tympanic membrane, external ear and ear canal normal.  Left Ear: Hearing, tympanic  membrane, external ear and ear canal normal.  Nose: Mucosal edema and rhinorrhea present.  Mouth/Throat: Uvula is midline, oropharynx is clear and moist and mucous membranes are normal.  +PND  Eyes: Conjunctivae are normal. No scleral icterus.  Neck: Normal range of motion.  Neck supple. No JVD present.  Cardiovascular: Normal rate, regular rhythm and normal heart sounds.   Pulmonary/Chest: Effort normal and breath sounds normal. No stridor. No respiratory distress. He has no wheezes.  Abdominal: Soft. Bowel sounds are normal. He exhibits no distension. There is no tenderness.  Musculoskeletal: Normal range of motion. He exhibits no edema and no tenderness.  Neurological: He is alert and oriented to person, place, and time.  Skin: Skin is warm and dry. No rash noted. No erythema.  Psychiatric: He has a normal mood and affect. His behavior is normal.    ED Course  Procedures (including critical care time) Labs Review Labs Reviewed - No data to display  Imaging Review Dg Chest 2 View  03/12/2014   CLINICAL DATA:  Shortness of breath.  EXAM: CHEST  2 VIEW  COMPARISON:  PA and lateral chest 07/16/2013 and 06/09/2013.  FINDINGS: Bilateral pulmonary hyperexpansion compatible with emphysema is again seen. Scarring in the right lung apex is unchanged. Heart size is normal. There is no pneumothorax or pleural effusion.  IMPRESSION: No acute abnormality.  Pulmonary hyperexpansion suggestive of emphysema.   Electronically Signed   By: Inge Rise M.D.   On: 03/12/2014 09:13     MDM   1. COPD GOLD II    ECG: NSR @ 73 bpm without ectopy or acute ST/T wave changes.  chest xray was without evidence of lung infection. Advised to begin using Atrovent nasal spray as prescribed to help dry up nasal drainage.  use the Albuterol inhaler as prescribed if your have episodes of shortness of breath. Instructed to use Symbicort inhaler as prescribed on a daily basis. Suggested that If symptoms do not improve with above treatments,  follow up with either  primary care doctor or pulmonary doctor (Dr. Melvyn Novas).  Patient given albuterol 5mg  and Atrovent 0.5 mg neb tx at [facility] and reports improvement of symptoms following treatment.    [place], [place] 03/12/14 [phone]

## 2014-03-12 NOTE — ED Provider Notes (Signed)
Medical screening examination/treatment/procedure(s) were performed by non-physician practitioner and as supervising physician I was immediately available for consultation/collaboration.  Philipp Deputy, M.D.  Harden Mo, MD 03/12/14 1149

## 2014-03-12 NOTE — Discharge Instructions (Signed)
Your chest xray was without evidence of lung infection. You may wish to begin using Atrovent nasal spray as prescribed to help dry up your nasal drainage. You may also use the Albuterol inhaler as prescribed if your have episodes of shortness of breath. You are also supposed to use your Symbicort inhaler as prescribed on a daily basis (every day). If symptoms do not improve with above treatments, please follow up with either your primary care doctor or your pulmonary doctor (Dr. Melvyn Novas).  Chronic Obstructive Pulmonary Disease Exacerbation Chronic obstructive pulmonary disease (COPD) is a common lung condition in which airflow from the lungs is limited. COPD is a general term that can be used to describe many different lung problems that limit airflow, including chronic bronchitis and emphysema. COPD exacerbations are episodes when breathing symptoms become much worse and require extra treatment. Without treatment, COPD exacerbations can be life threatening, and frequent COPD exacerbations can cause further damage to your lungs. CAUSES   Respiratory infections.   Exposure to smoke.   Exposure to air pollution, chemical fumes, or dust. Sometimes there is no apparent cause or trigger. RISK FACTORS  Smoking cigarettes.  Older age.  Frequent prior COPD exacerbations. SIGNS AND SYMPTOMS   Increased coughing.   Increased thick spit (sputum) production.   Increased wheezing.   Increased shortness of breath.   Rapid breathing.   Chest tightness. DIAGNOSIS  Your medical history, a physical exam, and tests will help your health care provider make a diagnosis. Tests may include:  A chest X-ray.  Basic lab tests.  Sputum testing.  An arterial blood gas test. TREATMENT  Depending on the severity of your COPD exacerbation, you may need to be admitted to a hospital for treatment. Some of the treatments commonly used to treat COPD exacerbations are:   Antibiotic medicines.    Bronchodilators. These are drugs that expand the air passages. They may be given with an inhaler or nebulizer. Spacer devices may be needed to help improve drug delivery.  Corticosteroid medicines.  Supplemental oxygen therapy.  HOME CARE INSTRUCTIONS   Do not smoke. Quitting smoking is very important to prevent COPD from getting worse and exacerbations from happening as often.  Avoid exposure to all substances that irritate the airway, especially to tobacco smoke.   If prescribed, take your antibiotics as directed. Finish them even if you start to feel better.  Only take over-the-counter or prescription medicines as directed by your health care provider.It is important to use correct technique with inhaled medicines.  Drink enough fluids to keep your urine clear or pale yellow (unless you have a medical condition that requires fluid restriction).  Use a cool mist vaporizer. This makes it easier to clear your chest when you cough.   If you have a home nebulizer and oxygen, continue to use them as directed.   Maintain all necessary vaccinations to prevent infections.   Exercise regularly.   Eat a healthy diet.   Keep all follow-up appointments as directed by your health care provider. SEEK IMMEDIATE MEDICAL CARE IF:  You have worsening shortness of breath.   You have trouble talking.   You have severe chest pain.  You have blood in your sputum.  You have a fever.  You have weakness, vomit repeatedly, or faint.   You feel confused.   You continue to get worse. MAKE SURE YOU:   Understand these instructions.  Will watch your condition.  Will get help right away if you are not doing well  or get worse. Document Released: 06/18/2007 Document Revised: 06/11/2013 Document Reviewed: 04/25/2013 [facility] Patient Information 2015 [place], [place]. This information is not intended to replace advice given to you by your health care provider. Make sure you  discuss any questions you have with your health care provider.  Chronic Obstructive Pulmonary Disease Chronic obstructive pulmonary disease (COPD) is a common lung condition in which airflow from the lungs is limited. COPD is a general term that can be used to describe many different lung problems that limit airflow, including both chronic bronchitis and emphysema. If you have COPD, your lung function will probably never return to normal, but there are measures you can take to improve lung function and make yourself feel better.  CAUSES   Smoking (common).   Exposure to secondhand smoke.   Genetic problems.  Chronic inflammatory lung diseases or recurrent infections. SYMPTOMS   Shortness of breath, especially with physical activity.   Deep, persistent (chronic) cough with a large amount of thick mucus.   Wheezing.   Rapid breaths (tachypnea).   Gray or bluish discoloration (cyanosis) of the skin, especially in fingers, toes, or lips.   Fatigue.   Weight loss.   Frequent infections or episodes when breathing symptoms become much worse (exacerbations).   Chest tightness. DIAGNOSIS  Your healthcare provider will take a medical history and perform a physical examination to make the initial diagnosis. Additional tests for COPD may include:   Lung (pulmonary) function tests.  Chest X-ray.  CT scan.  Blood tests. TREATMENT  Treatment available to help you feel better when you have COPD include:   Inhaler and nebulizer medicines. These help manage the symptoms of COPD and make your breathing more comfortable  Supplemental oxygen. Supplemental oxygen is only helpful if you have a low oxygen level in your blood.   Exercise and physical activity. These are beneficial for nearly all people with COPD. Some people may also benefit from a pulmonary rehabilitation program. HOME CARE INSTRUCTIONS   Take all medicines (inhaled or pills) as directed by your health care  provider.  Only take over-the-counter or prescription medicines for pain, fever, or discomfort as directed by your health care provider.   Avoid over-the-counter medicines or cough syrups that dry up your airway (such as antihistamines) and slow down the elimination of secretions unless instructed otherwise by your healthcare provider.   If you are a smoker, the most important thing that you can do is stop smoking. Continuing to smoke will cause further lung damage and breathing trouble. Ask your health care provider for help with quitting smoking. He or she can direct you to community resources or hospitals that provide support.  Avoid exposure to irritants such as smoke, chemicals, and fumes that aggravate your breathing.  Use oxygen therapy and pulmonary rehabilitation if directed by your health care provider. If you require home oxygen therapy, ask your healthcare provider whether you should purchase a pulse oximeter to measure your oxygen level at home.   Avoid contact with individuals who have a contagious illness.  Avoid extreme temperature and humidity changes.  Eat healthy foods. Eating smaller, more frequent meals and resting before meals may help you maintain your strength.  Stay active, but balance activity with periods of rest. Exercise and physical activity will help you maintain your ability to do things you want to do.  Preventing infection and hospitalization is very important when you have COPD. Make sure to receive all the vaccines your health care provider recommends, especially the  pneumococcal and influenza vaccines. Ask your healthcare provider whether you need a pneumonia vaccine.  Learn and use relaxation techniques to manage stress.  Learn and use controlled breathing techniques as directed by your health care provider. Controlled breathing techniques include:   Pursed lip breathing. Start by breathing in (inhaling) through your nose for 1 second. Then, purse  your lips as if you were going to whistle and breathe out (exhale) through the pursed lips for 2 seconds.   Diaphragmatic breathing. Start by putting one hand on your abdomen just above your waist. Inhale slowly through your nose. The hand on your abdomen should move out. Then purse your lips and exhale slowly. You should be able to feel the hand on your abdomen moving in as you exhale.   Learn and use controlled coughing to clear mucus from your lungs. Controlled coughing is a series of short, progressive coughs. The steps of controlled coughing are:  1. Lean your head slightly forward.  2. Breathe in deeply using diaphragmatic breathing.  3. Try to hold your breath for 3 seconds.  4. Keep your mouth slightly open while coughing twice.  5. Spit any mucus out into a tissue.  6. Rest and repeat the steps once or twice as needed. SEEK MEDICAL CARE IF:   You are coughing up more mucus than usual.   There is a change in the color or thickness of your mucus.   Your breathing is more labored than usual.   Your breathing is faster than usual.  SEEK IMMEDIATE MEDICAL CARE IF:   You have shortness of breath while you are resting.   You have shortness of breath that prevents you from:  Being able to talk.   Performing your usual physical activities.   You have chest pain lasting longer than 5 minutes.   Your skin color is more cyanotic than usual.  You measure low oxygen saturations for longer than 5 minutes with a pulse oximeter. MAKE SURE YOU:   Understand these instructions.  Will watch your condition.  Will get help right away if you are not doing well or get worse. Document Released: 05/31/2005 Document Revised: 06/11/2013 Document Reviewed: 04/17/2013 [facility] Patient Information 2015 [place], [place]. This information is not intended to replace advice given to you by your health care provider. Make sure you discuss any questions you have with your health care  provider.

## 2014-03-13 ENCOUNTER — Telehealth: Payer: Self-pay | Admitting: Internal Medicine

## 2014-03-13 MED ORDER — PREDNISONE 10 MG PO TABS: ORAL_TABLET | ORAL | Status: DC

## 2014-03-13 NOTE — Telephone Encounter (Signed)
Called spoke w/ pt. appt scheduled to see MW Monday. RX sent to [facility]. Nothing further needed

## 2014-03-13 NOTE — Telephone Encounter (Signed)
Ok to try Prednisone 10 mg take  4 each am x 2 days,   2 each am x 2 days,  1 each am x 2 days and stop but if gets any worse will need to go back to ER as nothing else to offer over the phone and too late to work in this pm

## 2014-03-13 NOTE — Telephone Encounter (Signed)
Called and spoke with pt and he stated that he was seen in the ER on 7/9 and was given a rescue inhaler and a breathing tx.  He stated that this helped yesterday but he is having increased SOB again today and he is not sure what to do.  He stated that the ER told him to follow up with MW and pt was not sure if he should wait until next week.  Pt stated that his symbicort is not helping him.  He also stated that the ER told him he has COPD.  MW please advise. Thanks  No Known Allergies  Current Outpatient Prescriptions on File Prior to Visit  Medication Sig Dispense Refill  . acetaminophen (TYLENOL) 500 MG tablet Take 500 mg by mouth every 6 (six) hours as needed for mild pain.      Marland Kitchen albuterol (PROVENTIL HFA;VENTOLIN HFA) 108 (90 BASE) MCG/ACT inhaler Inhale 1-2 puffs into the lungs every 6 (six) hours as needed for wheezing or shortness of breath.  1 Inhaler  1  . ipratropium (ATROVENT) 0.06 % nasal spray Place 2 sprays into both nostrils 4 (four) times daily.  15 mL  1  . [DISCONTINUED] fluticasone (FLONASE) 50 MCG/ACT nasal spray Place 2 sprays into the nose 2 (two) times daily.  16 g  1   No current facility-administered medications on file prior to visit.

## 2014-03-16 ENCOUNTER — Ambulatory Visit (INDEPENDENT_AMBULATORY_CARE_PROVIDER_SITE_OTHER): Payer: No Typology Code available for payment source | Admitting: Internal Medicine

## 2014-03-16 ENCOUNTER — Telehealth: Payer: Self-pay | Admitting: Internal Medicine

## 2014-03-16 ENCOUNTER — Other Ambulatory Visit (INDEPENDENT_AMBULATORY_CARE_PROVIDER_SITE_OTHER): Payer: No Typology Code available for payment source

## 2014-03-16 ENCOUNTER — Encounter: Payer: Self-pay | Admitting: Internal Medicine

## 2014-03-16 VITALS — BP 114/88 | HR 76 | Temp 99.5°F | Ht 73.5 in | Wt 160.0 lb

## 2014-03-16 DIAGNOSIS — J449 Chronic obstructive pulmonary disease, unspecified: Secondary | ICD-10-CM

## 2014-03-16 LAB — CBC WITH DIFFERENTIAL/PLATELET
BASOS ABS: 0 10*3/uL (ref 0.0–0.1)
Basophils Relative: 0.3 % (ref 0.0–3.0)
Eosinophils Absolute: 0 10*3/uL (ref 0.0–0.7)
Eosinophils Relative: 0 % (ref 0.0–5.0)
HCT: 46.6 % (ref 39.0–52.0)
HEMOGLOBIN: 15.5 g/dL (ref 13.0–17.0)
LYMPHS PCT: 36.9 % (ref 12.0–46.0)
Lymphs Abs: 2.6 10*3/uL (ref 0.7–4.0)
MCHC: 33.2 g/dL (ref 30.0–36.0)
MCV: 93.8 fl (ref 78.0–100.0)
MONOS PCT: 6.6 % (ref 3.0–12.0)
Monocytes Absolute: 0.5 10*3/uL (ref 0.1–1.0)
Neutro Abs: 3.9 10*3/uL (ref 1.4–7.7)
Neutrophils Relative %: 56.2 % (ref 43.0–77.0)
PLATELETS: 246 10*3/uL (ref 150.0–400.0)
RBC: 4.96 Mil/uL (ref 4.22–5.81)
RDW: 13.8 % (ref 11.5–15.5)
WBC: 7 10*3/uL (ref 4.0–10.5)

## 2014-03-16 LAB — BASIC METABOLIC PANEL
BUN: 12 mg/dL (ref 6–23)
CO2: 28 mEq/L (ref 19–32)
Calcium: 9.8 mg/dL (ref 8.4–10.5)
Chloride: 104 mEq/L (ref 96–112)
Creatinine, Ser: 0.7 mg/dL (ref 0.4–1.5)
GFR: 145.33 mL/min (ref >60.00)
GLUCOSE: 103 mg/dL — AB (ref 70–99)
Potassium: 4.4 mEq/L (ref 3.5–5.1)
SODIUM: 140 meq/L (ref 135–145)

## 2014-03-16 MED ORDER — ALBUTEROL SULFATE HFA 108 (90 BASE) MCG/ACT IN AERS: INHALATION_SPRAY | RESPIRATORY_TRACT | Status: DC

## 2014-03-16 MED ORDER — FAMOTIDINE 20 MG PO TABS: 20.0000 mg | ORAL_TABLET | Freq: Every day | ORAL | Status: DC

## 2014-03-16 MED ORDER — OMEPRAZOLE 20 MG PO CPDR: 20.0000 mg | DELAYED_RELEASE_CAPSULE | Freq: Every day | ORAL | Status: DC

## 2014-03-16 MED ORDER — BUDESONIDE-FORMOTEROL FUMARATE 160-4.5 MCG/ACT IN AERO: INHALATION_SPRAY | RESPIRATORY_TRACT | Status: DC

## 2014-03-16 NOTE — Telephone Encounter (Signed)
Called made pt aware of recs. Nothing further needed 

## 2014-03-16 NOTE — Telephone Encounter (Signed)
Let pt know proair is the cheapest but also the best - we have no samples but did we did give him the symbicort which as the proair in it so stick with the symbicort

## 2014-03-16 NOTE — Telephone Encounter (Signed)
Called spoke with pt. He reports the rescue inhaler is too expensive $57.62. He reports he can't afford this. Requesting alternatives.  Called [facility]>>spoke w/ pharm. The proair that we called in is the less expensive. Please advise MW thanks

## 2014-03-16 NOTE — Assessment & Plan Note (Signed)
-   PFT's 07/15/13 FEV1  2.72 (78%) ratio 67 and no change p B2 and DLCO 75 corrects to 92%  - 03/16/2014 p extensive coaching HFA effectiveness =    75%   Symptoms are markedly disproportionate to objective findings and not clear this is all a lung problem but pt does appear to have difficult airway management issues. DDX of  difficult airways management all start with A and  include Adherence, Ace Inhibitors, Acid Reflux, Active Sinus Disease, Alpha 1 Antitripsin deficiency, Anxiety masquerading as Airways dz,  ABPA,  allergy(esp in young), Aspiration (esp in elderly), Adverse effects of DPI,  Active smokers, plus two Bs  = Bronchiectasis and Beta blocker use..and one C= CHF  Adherence is always the initial "prime suspect" and is a multilayered concern that requires a "trust but verify" approach in every patient - starting with knowing how to use medications, especially inhalers, correctly, keeping up with refills and understanding the fundamental difference between maintenance and prns vs those medications only taken for a very short course and then stopped and not refilled.  The proper method of use, as well as anticipated side effects, of a metered-dose inhaler are discussed and demonstrated to the patient. Improved effectiveness after extensive coaching during this visit to a level of approximately  75%   - anxiety usually dx of exclusion but in this case closer to top noting that breathing worse now than while smoking and only had mild airlfow obs then.

## 2014-03-16 NOTE — Patient Instructions (Addendum)
Symbicort 160 Take 2 puffs first thing in am and then another 2 puffs about 12 hours later.     Only use your albuterol (proair) as a rescue medication to be used if you can't catch your breath by resting or doing a relaxed purse lip breathing pattern.  - The less you use it, the better it will work when you need it. - Ok to use up to 2 puffs  every 4 hours if you must but call for immediate appointment if use goes up over your usual need - Don't leave home without it !!  (think of it like the spare tire for your car)   Try prilosec 20mg   Take 30-60 min before first meal of the day and Pepcid 20 mg one bedtime until cough is completely gone for at least a week without the need for cough suppression  Please remember to go to the lab  department downstairs for your tests - we will call you with the results when they are available.     Please schedule a follow up office visit in 2 weeks, sooner if needed

## 2014-03-16 NOTE — Progress Notes (Signed)
Subjective:    Patient ID: [name] [name], male    DOB: October 26, 1955   MRN: [id number]  Brief patient profile:  53 yobm with pna as child/good ex tol despite  Long smoking  with no problem with any resp problems until developing sinus problems Dec 2013 which required multiple trips to ER then started with variable sob Sept 2014 referred to pulmonary clinic 07/25/2013 for eval of sob and proved to have a minimal GOLD II severity 07/15/13 and quit smoking Dec 2015    History of Present Illness  07/25/2013 1st [facility] office visit/ [name] [name] cc variable doe x 2 months also at rest with anxiety which gets better s rx after an our, some throat clearing with dark mucus in ams and recurrent severe facial pain that gets better with abx/ flonase then relapses off it.  rec Prednisone 10 mg take  4 each am x 2 days,   2 each am x 2 days,  1 each am x 2 days and stop  Augmentin 875 mg take one pill twice daily  X 10 days - take at breakfast and supper with large glass of water.  It would help reduce the usual side effects (diarrhea and yeast infections) if you ate cultured yogurt at lunch.  symbicort 160 Take 2 puffs first thing in am and then another 2 puffs about 12 hours later if you feel it helps Work on inhaler technique:  relax and gently blow all the way out then take a nice smooth deep breath back in, triggering the inhaler at same time you start breathing in.  Hold for up to 5 seconds if you can.  Rinse and gargle with water when done The key is to stop smoking completely before smoking completely stops you!  Please see patient coordinator before you leave today  to schedule sinus CT in 2 weeks (no sooner) Pulmonary follow up is not needed but we can see you if you don't feel the symbicort is helping     03/16/2014 f/u ov/[name] [name] re: ? aecopd Chief Complaint  Patient presents with  . Acute Visit    Pt c/o increased SOB with or without exertion for the past 2 wks.   did not maint symbicort /  had 2 bad days x 6 m then 2 weeks prior to OV  Restarted symbicort but no better  > ER 03/12/14 some better with neb   No obvious day to day or daytime variabilty or assoc  cough or cp   overt sinus or hb symptoms. No unusual exp hx or h/o childhood pna/ asthma or knowledge of premature birth.  Sleeping ok without nocturnal  or early am exacerbation  of respiratory  c/o's or need for noct saba. Also denies any obvious fluctuation of symptoms with weather or environmental changes or other aggravating or alleviating factors except as outlined above   Current Medications, Allergies, Complete Past Medical History, Past Surgical History, Family History, and Social History were reviewed in [employer] record.  ROS  The following are not active complaints unless bolded sore throat, dysphagia, dental problems, itching, sneezing,  nasal congestion or excess/ purulent secretions, ear ache,   fever, chills, sweats, unintended wt loss, pleuritic or exertional cp, hemoptysis,  orthopnea pnd or leg swelling, presyncope, palpitations, heartburn, abdominal pain, anorexia, nausea, vomiting, diarrhea  or change in bowel or urinary habits, change in stools or urine, dysuria,hematuria,  rash, arthralgias, visual complaints, headache, numbness weakness or ataxia or problems with walking  or coordination,  change in mood/affect or memory.            Objective:   Physical Exam Anxious thin amb bm nad  03/16/2014       160  Wt Readings from Last 3 Encounters:  07/25/13 148 lb 3.2 oz (67.223 kg)  06/09/13 145 lb 8 oz (65.998 kg)  01/29/13 142 lb 9.6 oz (64.683 kg)     HEENT mild turbinate edema.  Oropharynx no thrush or excess pnd or cobblestoning.  No JVD or cervical adenopathy. Mild accessory muscle hypertrophy. Trachea midline, nl thryroid. Chest was hyperinflated by percussion with diminished breath sounds and moderate increased exp time without wheeze. Hoover sign positive at mid  inspiration. Regular rate and rhythm without murmur gallop or rub or increase P2 or edema.  Abd: no hsm, nl excursion. Ext warm without cyanosis- mild clubbing.   cxr   03/12/14 No acute abnormality.  Pulmonary hyperexpansion suggestive of emphysema .     Recent Labs Lab 03/16/14 1502  NA 140  K 4.4  CL 104  CO2 28  BUN 12  CREATININE 0.7  GLUCOSE 103*    Recent Labs Lab 03/16/14 1502  HGB 15.5  HCT 46.6  WBC 7.0  PLT 246.0        Assessment & Plan:

## 2014-03-18 NOTE — Progress Notes (Signed)
Quick Note:  Spoke with pt and notified of results per Dr. Wert. Pt verbalized understanding and denied any questions.  ______ 

## 2014-03-25 ENCOUNTER — Ambulatory Visit (INDEPENDENT_AMBULATORY_CARE_PROVIDER_SITE_OTHER): Payer: No Typology Code available for payment source | Admitting: Internal Medicine

## 2014-03-25 ENCOUNTER — Telehealth: Payer: Self-pay | Admitting: Internal Medicine

## 2014-03-25 ENCOUNTER — Encounter: Payer: Self-pay | Admitting: Internal Medicine

## 2014-03-25 ENCOUNTER — Other Ambulatory Visit (INDEPENDENT_AMBULATORY_CARE_PROVIDER_SITE_OTHER): Payer: No Typology Code available for payment source

## 2014-03-25 VITALS — BP 106/72 | HR 80 | Temp 98.7°F | Ht 73.5 in | Wt 157.8 lb

## 2014-03-25 DIAGNOSIS — R0989 Other specified symptoms and signs involving the circulatory and respiratory systems: Secondary | ICD-10-CM

## 2014-03-25 DIAGNOSIS — R0600 Dyspnea, unspecified: Secondary | ICD-10-CM | POA: Insufficient documentation

## 2014-03-25 DIAGNOSIS — R0609 Other forms of dyspnea: Secondary | ICD-10-CM

## 2014-03-25 DIAGNOSIS — J449 Chronic obstructive pulmonary disease, unspecified: Secondary | ICD-10-CM

## 2014-03-25 LAB — BRAIN NATRIURETIC PEPTIDE: PRO B NATRI PEPTIDE: 33 pg/mL (ref 0.0–100.0)

## 2014-03-25 LAB — TSH: TSH: 0.68 u[IU]/mL (ref 0.35–4.50)

## 2014-03-25 MED ORDER — CLONAZEPAM 0.5 MG PO TABS: 0.5000 mg | ORAL_TABLET | Freq: Two times a day (BID) | ORAL | Status: DC | PRN

## 2014-03-25 NOTE — Progress Notes (Signed)
Subjective:    Patient ID: [name] [name], male    DOB: 12/19/1955   MRN: [id number]  Brief patient profile:  47 yobm with pna as child/good ex tol despite  Long smoking  with no problem with any resp problems until developing sinus problems Dec 2013 which required multiple trips to ER then started with variable sob Sept 2014 referred to pulmonary clinic 07/25/2013 for eval of sob and proved to have a minimal GOLD II severity 07/15/13 and quit smoking Dec 2015    History of Present Illness  07/25/2013 1st [facility] office visit/ Wert cc variable doe x 2 months also at rest with anxiety which gets better s rx after an our, some throat clearing with dark mucus in ams and recurrent severe facial pain that gets better with abx/ flonase then relapses off it.  rec Prednisone 10 mg take  4 each am x 2 days,   2 each am x 2 days,  1 each am x 2 days and stop  Augmentin 875 mg take one pill twice daily  X 10 days - take at breakfast and supper with large glass of water.  It would help reduce the usual side effects (diarrhea and yeast infections) if you ate cultured yogurt at lunch.  symbicort 160 Take 2 puffs first thing in am and then another 2 puffs about 12 hours later if you feel it helps Work on inhaler technique:  relax and gently blow all the way out then take a nice smooth deep breath back in, triggering the inhaler at same time you start breathing in.  Hold for up to 5 seconds if you can.  Rinse and gargle with water when done The key is to stop smoking completely before smoking completely stops you!  Please see patient coordinator before you leave today  to schedule sinus CT in 2 weeks (no sooner) Pulmonary follow up is not needed but we can see you if you don't feel the symbicort is helping     03/16/2014 f/u ov/Wert re: ? aecopd Chief Complaint  Patient presents with  . Acute Visit    Pt c/o increased SOB with or without exertion for the past 2 wks.   did not maint symbicort /  had 2 bad days x 6 m then 2 weeks prior to OV  Restarted symbicort but no better  > ER 03/12/14 some better with neb  Rec symbicort 160 Take 2 puffs first thing in am and then another 2 puffs about 12 hours later.  Only use your albuterol (proair) as a rescue medication   Try prilosec 20mg   Take 30-60 min before first meal of the day and Pepcid 20 mg one bedtime until cough is completely gone for at least a week without the need for cough suppression     03/25/2014 f/u ov/Wert re: sob on symbicort 160 2bid and prn saba up to 4 x daily  Chief Complaint  Patient presents with  . Acute Visit    Pt c/o increased SOB since this am.  He also states "feels like I am losing my voice.     Symptoms as bad at rest as with exertion, assoc with hoarsness and dry cough. Pt not compliant with omeprazole "they make me sleepy" - still not smoking  No obvious day to day or daytime variabilty or assoc chronic cough or cp or chest tightness, subjective wheeze overt sinus or hb symptoms. No unusual exp hx or h/o childhood pna/ asthma or knowledge  of premature birth.  Sleeping ok without nocturnal  or early am exacerbation  of respiratory  c/o's or need for noct saba. Also denies any obvious fluctuation of symptoms with weather or environmental changes or other aggravating or alleviating factors except as outlined above   Current Medications, Allergies, Complete Past Medical History, Past Surgical History, Family History, and Social History were reviewed in [employer] record.  ROS  The following are not active complaints unless bolded sore throat, dysphagia, dental problems, itching, sneezing,  nasal congestion or excess/ purulent secretions, ear ache,   fever, chills, sweats, unintended wt loss, pleuritic or exertional cp, hemoptysis,  orthopnea pnd or leg swelling, presyncope, palpitations, heartburn, abdominal pain, anorexia, nausea, vomiting, diarrhea  or change in bowel or urinary habits,  change in stools or urine, dysuria,hematuria,  rash, arthralgias, visual complaints, headache, numbness weakness or ataxia or problems with walking or coordination,  change in mood/affect or memory.                     Objective:   Physical Exam Anxious thin amb bm nad  03/16/2014       160 > 03/25/2014   158  Wt Readings from Last 3 Encounters:  07/25/13 148 lb 3.2 oz (67.223 kg)  06/09/13 145 lb 8 oz (65.998 kg)  01/29/13 142 lb 9.6 oz (64.683 kg)     HEENT mild turbinate edema.  Oropharynx no thrush or excess pnd or cobblestoning.  No JVD or cervical adenopathy. Mild accessory muscle hypertrophy. Trachea midline, nl thryroid. Chest was hyperinflated by percussion with diminished breath sounds and moderate increased exp time without wheeze. Hoover sign positive at mid inspiration. Regular rate and rhythm without murmur gallop or rub or increase P2 or edema.  Abd: no hsm, nl excursion. Ext warm without cyanosis- mild clubbing.   cxr   03/12/14 No acute abnormality.  Pulmonary hyperexpansion suggestive of emphysema .     Recent Labs Lab 03/16/14 1502  NA 140  K 4.4  CL 104  CO2 28  BUN 12  CREATININE 0.7  GLUCOSE 103*    Recent Labs Lab 03/16/14 1502  HGB 15.5  HCT 46.6  WBC 7.0  PLT 246.0     Lab Results  Component Value Date   PROBNP 33.0 03/25/2014     Lab Results  Component Value Date   TSH 0.68 03/25/2014     03/25/14 D dimer 0.32     Assessment & Plan:

## 2014-03-25 NOTE — Patient Instructions (Addendum)
Clonazepam 0.5 mg twice daily   Please remember to go to the lab  department downstairs for your tests - we will call you with the results when they are available.  Reschedule follow up in 2 weeks  Late add: instructed to stop omeprazole and take pepcid 20 mg pc bic ? Adverse effect of meds - hoarseness may be from symbicort > consider change to spiriva respimat

## 2014-03-25 NOTE — Telephone Encounter (Signed)
Called spoke with patient who reports increased SOB x1 day.  He has been taking his Symbicort BID as directed and his Proair PRN.  Pt reports neither has helped.  Pt stated the pred taper given per the 7.10.15 phone note did help his symptoms last week.  Pt asking if he should go to the ED.  Pt was acutely dyspneic on the phone with no gasping, wheezing, hoarseness or cough.  Offered ov w/ MW today >> scheduled for 7.22.15 @ 1:45pm.  Pt okay with this appt time and verbalized his understanding to seek emergency assistance if his breathing worsens prior to ov.  Nothing further needed at this time; will sign off.

## 2014-03-26 LAB — D-DIMER, QUANTITATIVE (NOT AT ARMC): D DIMER QUANT: 0.32 ug{FEU}/mL (ref 0.00–0.48)

## 2014-03-26 MED ORDER — FAMOTIDINE 20 MG PO TABS: ORAL_TABLET | ORAL | Status: DC

## 2014-03-26 NOTE — Assessment & Plan Note (Signed)
Labs / xrays reviewed. No evidence chf/ occult pe

## 2014-03-26 NOTE — Assessment & Plan Note (Addendum)
-   PFT's 07/15/13 FEV1  2.72 (78%) ratio 67 and no change p B2 and DLCO 75 corrects to 92%  - 03/16/2014 p extensive coaching HFA effectiveness =    75%  - 03/25/2014  Walked RA x 3 laps @ 185 ft each stopped due to  No sob /desat - Spirometry 03/25/14  FEV1  2.59 (72%) ratio 69  Symptoms are markedly disproportionate to objective findings and not clear this is a lung problem but pt does appear to have difficult airway management issues. DDX of  difficult airways management all start with A and  include Adherence, Ace Inhibitors, Acid Reflux, Active Sinus Disease, Alpha 1 Antitripsin deficiency, Anxiety masquerading as Airways dz,  ABPA,  allergy(esp in young), Aspiration (esp in elderly), Adverse effects of DPI,  Active smokers, plus two Bs  = Bronchiectasis and Beta blocker use..and one C= CHF  Adherence is always the initial "prime suspect" and is a multilayered concern that requires a "trust but verify" approach in every patient - starting with knowing how to use medications, especially inhalers, correctly, keeping up with refills and understanding the fundamental difference between maintenance and prns vs those medications only taken for a very short course and then stopped and not refilled.    Anxiety/ vcd/ panic disorder usually dx of exclusion but based on lack of any finding to explain his sob rec trial of clonazepam 0.5 mg bid   ? Acid (or non-acid) GERD > always difficult to exclude as up to 75% of pts in some series report no assoc GI/ Heartburn symptoms>  not using omeprazole so try pepcid 20 mg bid pc and continue diet   ? Adverse effect of meds - hoarseness may be from symbiocrt > consider change to spiriva respimat or the 80/4.5 version of symbicort as he has such little evidence of copd sp successful smoking cessation 6 m prior to OV      Each maintenance medication was reviewed in detail including most importantly the difference between maintenance and as needed and under what  circumstances the prns are to be used.  Please see instructions for details which were reviewed in writing and the patient given a copy.

## 2014-03-26 NOTE — Progress Notes (Signed)
Quick Note:  Called and spoke to pt. Informed pt of results and recs per MW. Pt stated he felt the symbicort was not helping and understands when to use his rescue inhaler. Pt verbalized all understanding and denied any other questions or concerns at this time. ______

## 2014-03-30 ENCOUNTER — Ambulatory Visit: Payer: No Typology Code available for payment source | Admitting: Internal Medicine

## 2014-04-08 ENCOUNTER — Ambulatory Visit (INDEPENDENT_AMBULATORY_CARE_PROVIDER_SITE_OTHER): Payer: No Typology Code available for payment source | Admitting: Internal Medicine

## 2014-04-08 ENCOUNTER — Encounter: Payer: Self-pay | Admitting: Internal Medicine

## 2014-04-08 VITALS — BP 110/80 | HR 63 | Temp 98.1°F | Ht 73.5 in | Wt 158.0 lb

## 2014-04-08 DIAGNOSIS — J449 Chronic obstructive pulmonary disease, unspecified: Secondary | ICD-10-CM

## 2014-04-08 NOTE — Assessment & Plan Note (Signed)
-   PFT's 07/15/13 FEV1  2.72 (78%) ratio 67 and no change p B2 and DLCO 75 corrects to 92%  - 03/16/2014 p extensive coaching HFA effectiveness =    75%  - 03/25/2014  Walked RA x 3 laps @ 185 ft each stopped due to  No sob /desat  Symptoms are markedly disproportionate to objective findings and not clear this is a lung problem but pt does appear to have difficult airway management issues. DDX of  difficult airways management all start with A and  include Adherence, Ace Inhibitors, Acid Reflux, Active Sinus Disease, Alpha 1 Antitripsin deficiency, Anxiety masquerading as Airways dz,  ABPA,  allergy(esp in young), Aspiration (esp in elderly), Adverse effects of DPI,  Active smokers, plus two Bs  = Bronchiectasis and Beta blocker use..and one C= CHF  ? Acid (or non-acid) GERD > always difficult to exclude as up to 75% of pts in some series report no assoc GI/ Heartburn symptoms> rec max (24h)  acid suppression and diet restrictions/ reviewed and instructions given in writing.   ? Anxiety > continue clonazepam  ? Allergy/ asthma component > strongly doubt so worth a try off symbicort and if worse needs trial of LAMA next

## 2014-04-08 NOTE — Patient Instructions (Addendum)
Stop symbicort to see what difference it makes in your breathing with activity.  If you can tell the difference return here or see Dr Marlou Sa for trial of LAMA   Pulmonary follow up is as needed - your lungs are in great shape because you quit smoking

## 2014-04-08 NOTE — Progress Notes (Signed)
Subjective:    Patient ID: [name] [name], male    DOB: [birth year]   MRN: [id number]  Brief patient profile:  43 yobm with pna as child/good ex tol despite  Long smoking  with no problem with any resp problems until developing sinus problems Dec 2013 which required multiple trips to ER then started with variable sob Sept 2014 referred to pulmonary clinic 07/25/2013 for eval of sob and proved to have a minimal GOLD II severity 07/15/13 and quit smoking Dec 2015    History of Present Illness  07/25/2013 1st [facility] office visit/ Avrielle Fry cc variable doe x 2 months also at rest with anxiety which gets better s rx after an our, some throat clearing with dark mucus in ams and recurrent severe facial pain that gets better with abx/ flonase then relapses off it.  rec Prednisone 10 mg take  4 each am x 2 days,   2 each am x 2 days,  1 each am x 2 days and stop  Augmentin 875 mg take one pill twice daily  X 10 days - take at breakfast and supper with large glass of water.  It would help reduce the usual side effects (diarrhea and yeast infections) if you ate cultured yogurt at lunch.  symbicort 160 Take 2 puffs first thing in am and then another 2 puffs about 12 hours later if you feel it helps Work on inhaler technique:  relax and gently blow all the way out then take a nice smooth deep breath back in, triggering the inhaler at same time you start breathing in.  Hold for up to 5 seconds if you can.  Rinse and gargle with water when done The key is to stop smoking completely before smoking completely stops you!  Please see patient coordinator before you leave today  to schedule sinus CT in 2 weeks (no sooner) Pulmonary follow up is not needed but we can see you if you don't feel the symbicort is helping     03/16/2014 f/u ov/Keena Dinse re: ? aecopd Chief Complaint  Patient presents with  . Acute Visit    Pt c/o increased SOB with or without exertion for the past 2 wks.   did not maint symbicort /  had 2 bad days x 6 m then 2 weeks prior to OV  Restarted symbicort but no better  > ER 03/12/14 some better with neb  Rec symbicort 160 Take 2 puffs first thing in am and then another 2 puffs about 12 hours later.  Only use your albuterol (proair) as a rescue medication   Try prilosec 20mg   Take 30-60 min before first meal of the day and Pepcid 20 mg one bedtime until cough is completely gone for at least a week without the need for cough suppression     03/25/2014 f/u ov/Jowel Waltner re: sob on symbicort 160 2bid and prn saba up to 4 x daily  Chief Complaint  Patient presents with  . Acute Visit    Pt c/o increased SOB since this am.  He also states "feels like I am losing my voice.     Symptoms as bad at rest as with exertion, assoc with hoarsness and dry cough. Pt not compliant with omeprazole "they make me sleepy" - still not smoking rec Clonazepam 0.5 mg twice daily  Late add: instructed to stop omeprazole and take pepcid 20 mg pc bic ? Adverse effect of meds - hoarseness may be from symbicort > consider change to spiriva  respimat   04/08/2014 f/u ov/Quron Ruddy re: sob/ barely GOLD II copd  Chief Complaint  Patient presents with  . Follow-up    Pt states that his breathing has improved, but not back to his normal baseline.  Using rescue inhaler on average 2 x per day.   doe x  50 ft but also at rest, not reproducible in office. No noct or early am symptoms Gi / anxiety symptoms better on pepcid and clonazepam     No obvious day to day or daytime variabilty or assoc chronic cough or cp or chest tightness, subjective wheeze overt sinus or hb symptoms. No unusual exp hx or h/o childhood pna/ asthma or knowledge of premature birth.  Sleeping ok without nocturnal  or early am exacerbation  of respiratory  c/o's or need for noct saba. Also denies any obvious fluctuation of symptoms with weather or environmental changes or other aggravating or alleviating factors except as outlined above   Current  Medications, Allergies, Complete Past Medical History, Past Surgical History, Family History, and Social History were reviewed in [employer] record.  ROS  The following are not active complaints unless bolded sore throat, dysphagia, dental problems, itching, sneezing,  nasal congestion or excess/ purulent secretions, ear ache,   fever, chills, sweats, unintended wt loss, pleuritic or exertional cp, hemoptysis,  orthopnea pnd or leg swelling, presyncope, palpitations, heartburn, abdominal pain, anorexia, nausea, vomiting, diarrhea  or change in bowel or urinary habits, change in stools or urine, dysuria,hematuria,  rash, arthralgias, visual complaints, headache, numbness weakness or ataxia or problems with walking or coordination,  change in mood/affect or memory.                     Objective:   Physical Exam Anxious thin amb bm nad  03/16/2014       160 > 03/25/2014   158 > 04/08/2014  158  Wt Readings from Last 3 Encounters:  07/25/13 148 lb 3.2 oz (67.223 kg)  06/09/13 145 lb 8 oz (65.998 kg)  01/29/13 142 lb 9.6 oz (64.683 kg)     HEENT mild turbinate edema.  Oropharynx no thrush or excess pnd or cobblestoning.  No JVD or cervical adenopathy. Mild accessory muscle hypertrophy. Trachea midline, nl thryroid. Chest was hyperinflated by percussion with diminished breath sounds and moderate increased exp time without wheeze. Hoover sign positive at mid inspiration. Regular rate and rhythm without murmur gallop or rub or increase P2 or edema.  Abd: no hsm, nl excursion. Ext warm without cyanosis- mild clubbing.   cxr   03/12/14 No acute abnormality.  Pulmonary hyperexpansion suggestive of emphysema .     Recent Labs Lab 03/16/14 1502  NA 140  K 4.4  CL 104  CO2 28  BUN 12  CREATININE 0.7  GLUCOSE 103*    Recent Labs Lab 03/16/14 1502  HGB 15.5  HCT 46.6  WBC 7.0  PLT 246.0     Lab Results  Component Value Date   PROBNP 33.0 03/25/2014     Lab  Results  Component Value Date   TSH 0.68 03/25/2014     03/25/14 D dimer 0.32     Assessment & Plan:

## 2014-06-15 ENCOUNTER — Other Ambulatory Visit: Payer: Self-pay | Admitting: Gastroenterology

## 2014-06-15 DIAGNOSIS — R140 Abdominal distension (gaseous): Secondary | ICD-10-CM

## 2014-06-16 ENCOUNTER — Other Ambulatory Visit (HOSPITAL_COMMUNITY): Payer: Self-pay | Admitting: Gastroenterology

## 2014-06-16 ENCOUNTER — Ambulatory Visit
Admission: RE | Admit: 2014-06-16 | Discharge: 2014-06-16 | Disposition: A | Discharge status: Home / Self Care | Payer: No Typology Code available for payment source | Source: Ambulatory Visit | Attending: Gastroenterology | Admitting: Gastroenterology

## 2014-06-16 DIAGNOSIS — R9389 Abnormal findings on diagnostic imaging of other specified body structures: Secondary | ICD-10-CM

## 2014-06-16 DIAGNOSIS — R140 Abdominal distension (gaseous): Secondary | ICD-10-CM

## 2014-06-23 ENCOUNTER — Encounter (HOSPITAL_COMMUNITY)
Admission: RE | Admit: 2014-06-23 | Discharge: 2014-06-23 | Disposition: A | Discharge status: Home / Self Care | Payer: No Typology Code available for payment source | Source: Ambulatory Visit | Attending: Gastroenterology | Admitting: Gastroenterology

## 2014-06-23 DIAGNOSIS — R9389 Abnormal findings on diagnostic imaging of other specified body structures: Secondary | ICD-10-CM

## 2014-06-23 DIAGNOSIS — R938 Abnormal findings on diagnostic imaging of other specified body structures: Secondary | ICD-10-CM | POA: Insufficient documentation

## 2014-06-23 MED ORDER — TECHNETIUM TC 99M MEDRONATE IV KIT
25.0000 | PACK | Freq: Once | INTRAVENOUS | Status: AC | PRN
  Administered 2014-06-23: 25 via INTRAVENOUS

## 2014-07-22 ENCOUNTER — Encounter (HOSPITAL_COMMUNITY): Payer: Self-pay | Admitting: *Deleted

## 2014-07-22 ENCOUNTER — Emergency Department (INDEPENDENT_AMBULATORY_CARE_PROVIDER_SITE_OTHER)
Admission: EM | Admit: 2014-07-22 | Discharge: 2014-07-22 | Disposition: A | Discharge status: Home / Self Care | Payer: No Typology Code available for payment source | Source: Home / Self Care | Attending: Family Medicine | Admitting: Family Medicine

## 2014-07-22 DIAGNOSIS — R631 Polydipsia: Secondary | ICD-10-CM

## 2014-07-22 HISTORY — DX: Inflammatory liver disease, unspecified: K75.9

## 2014-07-22 LAB — POCT I-STAT, CHEM 8
BUN: 8 mg/dL (ref 6–23)
CALCIUM ION: 1.12 mmol/L (ref 1.12–1.23)
CREATININE: 0.7 mg/dL (ref 0.50–1.35)
Chloride: 104 mEq/L (ref 96–112)
Glucose, Bld: 87 mg/dL (ref 70–99)
HCT: 50 % (ref 39.0–52.0)
Hemoglobin: 17 g/dL (ref 13.0–17.0)
Potassium: 5 mEq/L (ref 3.7–5.3)
SODIUM: 140 meq/L (ref 137–147)
TCO2: 27 mmol/L (ref 0–100)

## 2014-07-22 LAB — POCT URINALYSIS DIP (DEVICE)
Bilirubin Urine: NEGATIVE
Glucose, UA: NEGATIVE mg/dL
Hgb urine dipstick: NEGATIVE
Ketones, ur: NEGATIVE mg/dL
Leukocytes, UA: NEGATIVE
Nitrite: NEGATIVE
PH: 6 (ref 5.0–8.0)
PROTEIN: NEGATIVE mg/dL
UROBILINOGEN UA: 0.2 mg/dL (ref 0.0–1.0)

## 2014-07-22 NOTE — Discharge Instructions (Signed)
I would recommend that you limit your water intake to around 2 liters in a 24 hour period. If you continue to experience excessive urination with these restrictions, please either contact your doctor or return here for re-evaluation.

## 2014-07-22 NOTE — ED Provider Notes (Signed)
CSN: [id number]     Arrival date & time 07/22/14  1105 History   First MD Initiated Contact with Patient 07/22/14 1141     Chief Complaint  Patient presents with  . Urinary Frequency   (Consider location/radiation/quality/duration/timing/severity/associated sxs/prior Treatment) HPI Comments: Patient reports 48 hours of mild dysuria with associated urinary frequency without abdominal or flank pain and without fever, N/V. No hematuria. States he was recently placed on new medication for Hep C (Harvoni) on 07/08/2014, but does nor recall frequent urination being one of the potential side effects of this medications. States he has been drinking additional water because he was instructed to do so while on Harvoni, but does not do this because he feels excessively thirsty. Endorses some chronic intermittent diarrhea, but was told that this was an expected medication side effect.  PCP: Dr. Kevan Ny GI: Dr. Paulita Fujita   Patient is a 58 y.o. male presenting with frequency. The history is provided by the patient.  Urinary Frequency    Past Medical History  Diagnosis Date  . Stomach ulcer   . Hepatitis    Past Surgical History  Procedure Laterality Date  . Hernia repair     History reviewed. No pertinent family history. History  Substance Use Topics  . Smoking status: Former Smoker -- 0.50 packs/day for 30 years    Types: Cigarettes    Quit date: 08/04/2013  . Smokeless tobacco: Never Used  . Alcohol Use: No     Comment: QUIT    Review of Systems  Constitutional: Negative for fever, chills and fatigue.  HENT: Negative.   Respiratory: Negative.   Cardiovascular: Negative.   Gastrointestinal: Negative.   Genitourinary: Positive for dysuria and frequency. Negative for urgency, hematuria, flank pain, penile swelling, scrotal swelling and penile pain.  Musculoskeletal: Negative.   Skin: Negative.   Neurological: Positive for dizziness and light-headedness.  All other systems reviewed  and are negative.   Allergies  Review of patient's allergies indicates no known allergies.  Home Medications   Prior to Admission medications   Medication Sig Start Date End Date Taking? Authorizing Provider  Ledipasvir-Sofosbuvir (HARVONI PO) Take by mouth.   Yes Historical Provider, MD  acetaminophen (TYLENOL) 500 MG tablet Take 500 mg by mouth every 6 (six) hours as needed for mild pain.    Historical Provider, MD  albuterol (PROAIR HFA) 108 (90 BASE) MCG/ACT inhaler 2 puffs every 4 hours as needed only  if your can't catch your breath 03/16/14   Tanda Rockers, MD  clonazePAM (KLONOPIN) 0.5 MG tablet Take 1 tablet (0.5 mg total) by mouth 2 (two) times daily as needed for anxiety. 03/25/14   Tanda Rockers, MD  famotidine (PEPCID) 20 MG tablet One twice daily after meals 03/26/14   Tanda Rockers, MD   BP 124/82 mmHg  Pulse 82  Temp(Src) 99.4 F (37.4 C) (Oral)  Resp 16  SpO2 100% Physical Exam  Constitutional: He is oriented to person, place, and time. He appears well-developed and well-nourished.  HENT:  Head: Normocephalic and atraumatic.  Eyes: Conjunctivae are normal. No scleral icterus.  Cardiovascular: Normal rate, regular rhythm and normal heart sounds.   Pulmonary/Chest: Effort normal and breath sounds normal.  Abdominal: Soft. Normal appearance and bowel sounds are normal. There is no tenderness. There is no CVA tenderness.  Musculoskeletal: Normal range of motion.  Neurological: He is alert and oriented to person, place, and time.  Skin: Skin is warm and dry. No rash noted. No erythema.  Psychiatric: He has a normal mood and affect. His behavior is normal.  Nursing note and vitals reviewed.   ED Course  Procedures (including critical care time) Labs Review Labs Reviewed  URINE CULTURE  POCT URINALYSIS DIP (DEVICE)  POCT I-STAT, CHEM 8    Imaging Review No results found.   MDM   1. Excessive drinking of fluids    UA without indication of infection. Only  notable item is the urine is dilute (spec grav <1.005).  Istat 8 normal, including sodium and glucose. NO information found to suggest DI as a known side effect of Harvoni.  Will place patient on 2L Q24hr water restriction and advise close follow up either here or with his GI provider if excessive urination persists despite fluid restriction.     Lutricia Feil, [place] 07/22/14 [phone]

## 2014-07-22 NOTE — ED Notes (Signed)
Pt  Reports      Frequent  Urination      And thirst  X  2  Days      Pt  Reports  Put on new  Med  For [facility]    sev  Days  Ago   Pt  Reports      Dizzy  As  Well       Yet ambulates    With  A  Steady  Fluid gait  And  Is alert /  Oriented

## 2014-07-23 LAB — URINE CULTURE
COLONY COUNT: NO GROWTH
CULTURE: NO GROWTH
Special Requests: NORMAL

## 2014-07-26 ENCOUNTER — Emergency Department (INDEPENDENT_AMBULATORY_CARE_PROVIDER_SITE_OTHER)
Admission: EM | Admit: 2014-07-26 | Discharge: 2014-07-26 | Disposition: A | Discharge status: Home / Self Care | Payer: No Typology Code available for payment source | Source: Home / Self Care | Attending: Family Medicine | Admitting: Family Medicine

## 2014-07-26 ENCOUNTER — Encounter (HOSPITAL_COMMUNITY): Payer: Self-pay | Admitting: Emergency Medicine

## 2014-07-26 DIAGNOSIS — F418 Other specified anxiety disorders: Secondary | ICD-10-CM

## 2014-07-26 NOTE — Discharge Instructions (Signed)
Thank you for coming by and allowing to answer your questions. Please resume your medications and continue with drinking 2-2.5L of water a day and follow up with your doctor should your have any additional concerns

## 2014-07-26 NOTE — ED Notes (Signed)
Pt states that he is here for medical advice for a enlarge prostate that he was diagnosed from his PCP

## 2014-07-26 NOTE — ED Provider Notes (Signed)
CSN: [id number]     Arrival date & time 07/26/14  1011 History   First MD Initiated Contact with Patient 07/26/14 1041     Chief Complaint  Patient presents with  . Follow-up   (Consider location/radiation/quality/duration/timing/severity/associated sxs/prior Treatment) HPI Comments: Patient returns for follow up after visit to [facility] on 07/22/2014. He was placed on 2L fluid restriction for frequent urination and reports urine output has normalized. Patient was seen by his PCP on 07/23/2014 and was told that he had an enlarged prostate. He is here because he felt like he did not have the opportunity to have all of his questions answered regarding enlarged prostate and has been unable to reach his PCP to ask questions. He does endorse that additional lab work with regard to prostate was drawn by PCP and he does have a follow up appointment scheduled in 1 week to discuss results. Questions if his current medications should be modified or discontinued.   The history is provided by the patient.    Past Medical History  Diagnosis Date  . Stomach ulcer   . Hepatitis    Past Surgical History  Procedure Laterality Date  . Hernia repair     History reviewed. No pertinent family history. History  Substance Use Topics  . Smoking status: Former Smoker -- 0.50 packs/day for 30 years    Types: Cigarettes    Quit date: 08/04/2013  . Smokeless tobacco: Never Used  . Alcohol Use: No     Comment: QUIT    Review of Systems  All other systems reviewed and are negative.   Allergies  Review of patient's allergies indicates no known allergies.  Home Medications   Prior to Admission medications   Medication Sig Start Date End Date Taking? Authorizing Provider  acetaminophen (TYLENOL) 500 MG tablet Take 500 mg by mouth every 6 (six) hours as needed for mild pain.    Historical Provider, MD  albuterol (PROAIR HFA) 108 (90 BASE) MCG/ACT inhaler 2 puffs every 4 hours as needed only  if your can't  catch your breath 03/16/14   Tanda Rockers, MD  clonazePAM (KLONOPIN) 0.5 MG tablet Take 1 tablet (0.5 mg total) by mouth 2 (two) times daily as needed for anxiety. 03/25/14   Tanda Rockers, MD  famotidine (PEPCID) 20 MG tablet One twice daily after meals 03/26/14   Tanda Rockers, MD  Ledipasvir-Sofosbuvir (HARVONI PO) Take by mouth.    Historical Provider, MD   BP 134/94 mmHg  Pulse 67  Temp(Src) 97.8 F (36.6 C) (Oral)  Resp 16  SpO2 100% Physical Exam  Constitutional: He is oriented to person, place, and time. He appears well-developed and well-nourished. No distress.  HENT:  Head: Normocephalic and atraumatic.  Cardiovascular: Normal rate.   Pulmonary/Chest: Effort normal.  Musculoskeletal: Normal range of motion.  Neurological: He is alert and oriented to person, place, and time.  Skin: Skin is warm and dry.  Psychiatric: His speech is normal and behavior is normal. Thought content normal. His mood appears anxious.  Nursing note and vitals reviewed.   ED Course  Procedures (including critical care time) Labs Review Labs Reviewed - No data to display  Imaging Review No results found.   MDM   1. Anxiety about health    Patient thought that being told he had an enlarge prostate meant he had prostate cancer. He was concerned that he needs to start treatment. I reassured patient that the diagnosis of prostate enlargement did not automatically mean  he had prostate cancer. While lab work not yet available, he should resume and continue all of his current medications and follow up as advised. He can return to his usual daily activities. He voices reassurance following our conversation.     Lutricia Feil, [place] 07/26/14 [phone]

## 2014-09-02 ENCOUNTER — Encounter (HOSPITAL_COMMUNITY): Payer: Self-pay

## 2014-09-02 ENCOUNTER — Emergency Department (INDEPENDENT_AMBULATORY_CARE_PROVIDER_SITE_OTHER)
Admission: EM | Admit: 2014-09-02 | Discharge: 2014-09-02 | Disposition: A | Discharge status: Other Acute Inpatient Hospital | Payer: No Typology Code available for payment source | Source: Home / Self Care | Attending: Emergency Medicine | Admitting: Emergency Medicine

## 2014-09-02 ENCOUNTER — Encounter (HOSPITAL_COMMUNITY): Payer: Self-pay | Admitting: Neurology

## 2014-09-02 ENCOUNTER — Emergency Department (HOSPITAL_COMMUNITY)
Admission: EM | Admit: 2014-09-02 | Discharge: 2014-09-02 | Disposition: A | Discharge status: Home / Self Care | Payer: No Typology Code available for payment source | Attending: Emergency Medicine | Admitting: Emergency Medicine

## 2014-09-02 ENCOUNTER — Emergency Department (HOSPITAL_COMMUNITY): Payer: No Typology Code available for payment source

## 2014-09-02 DIAGNOSIS — R079 Chest pain, unspecified: Secondary | ICD-10-CM

## 2014-09-02 DIAGNOSIS — E780 Pure hypercholesterolemia: Secondary | ICD-10-CM | POA: Insufficient documentation

## 2014-09-02 DIAGNOSIS — Z87891 Personal history of nicotine dependence: Secondary | ICD-10-CM | POA: Insufficient documentation

## 2014-09-02 DIAGNOSIS — Z8619 Personal history of other infectious and parasitic diseases: Secondary | ICD-10-CM | POA: Insufficient documentation

## 2014-09-02 DIAGNOSIS — Z8719 Personal history of other diseases of the digestive system: Secondary | ICD-10-CM | POA: Insufficient documentation

## 2014-09-02 DIAGNOSIS — R202 Paresthesia of skin: Secondary | ICD-10-CM | POA: Insufficient documentation

## 2014-09-02 DIAGNOSIS — R200 Anesthesia of skin: Secondary | ICD-10-CM | POA: Insufficient documentation

## 2014-09-02 DIAGNOSIS — Z79899 Other long term (current) drug therapy: Secondary | ICD-10-CM | POA: Insufficient documentation

## 2014-09-02 DIAGNOSIS — R0789 Other chest pain: Secondary | ICD-10-CM | POA: Insufficient documentation

## 2014-09-02 DIAGNOSIS — J441 Chronic obstructive pulmonary disease with (acute) exacerbation: Secondary | ICD-10-CM | POA: Insufficient documentation

## 2014-09-02 HISTORY — DX: Unspecified viral hepatitis C without hepatic coma: B19.20

## 2014-09-02 HISTORY — DX: Nasal congestion: R09.81

## 2014-09-02 HISTORY — DX: Chronic obstructive pulmonary disease, unspecified: J44.9

## 2014-09-02 LAB — I-STAT TROPONIN, ED: TROPONIN I, POC: 0 ng/mL (ref 0.00–0.08)

## 2014-09-02 LAB — BASIC METABOLIC PANEL
ANION GAP: 9 (ref 5–15)
BUN: 9 mg/dL (ref 6–23)
CO2: 22 mmol/L (ref 19–32)
Calcium: 9.6 mg/dL (ref 8.4–10.5)
Chloride: 106 mEq/L (ref 96–112)
Creatinine, Ser: 0.75 mg/dL (ref 0.50–1.35)
GFR calc non Af Amer: >90 mL/min (ref >90)
Glucose, Bld: 83 mg/dL (ref 70–99)
Potassium: 3.8 mmol/L (ref 3.5–5.1)
SODIUM: 137 mmol/L (ref 135–145)

## 2014-09-02 LAB — CBC
HCT: 41.9 % (ref 39.0–52.0)
Hemoglobin: 15 g/dL (ref 13.0–17.0)
MCH: 31.3 pg (ref 26.0–34.0)
MCHC: 35.8 g/dL (ref 30.0–36.0)
MCV: 87.3 fL (ref 78.0–100.0)
PLATELETS: 206 10*3/uL (ref 150–400)
RBC: 4.8 MIL/uL (ref 4.22–5.81)
RDW: 12.8 % (ref 11.5–15.5)
WBC: 7.1 10*3/uL (ref 4.0–10.5)

## 2014-09-02 LAB — D-DIMER, QUANTITATIVE: D-Dimer, Quant: <0.27 ug/mL-FEU (ref 0.00–0.48)

## 2014-09-02 LAB — TROPONIN I: Troponin I: <0.03 ng/mL (ref <0.031)

## 2014-09-02 MED ORDER — ASPIRIN 81 MG PO CHEW
324.0000 mg | CHEWABLE_TABLET | Freq: Once | ORAL | Status: AC
Start: 2014-09-02 — End: 2014-09-02
  Administered 2014-09-02: 324 mg via ORAL

## 2014-09-02 MED ORDER — IBUPROFEN 800 MG PO TABS
800.0000 mg | ORAL_TABLET | Freq: Once | ORAL | Status: AC
  Administered 2014-09-02: 800 mg via ORAL
  Filled 2014-09-02: qty 1

## 2014-09-02 MED ORDER — ASPIRIN 81 MG PO CHEW
CHEWABLE_TABLET | ORAL | Status: AC
  Filled 2014-09-02: qty 4

## 2014-09-02 MED ORDER — NITROGLYCERIN 0.4 MG SL SUBL
0.4000 mg | SUBLINGUAL_TABLET | SUBLINGUAL | Status: AC | PRN
  Administered 2014-09-02: 0.4 mg via SUBLINGUAL

## 2014-09-02 MED ORDER — SODIUM CHLORIDE 0.9 % IV SOLN
INTRAVENOUS | Status: DC
  Administered 2014-09-02: 13:00:00 via INTRAVENOUS

## 2014-09-02 MED ORDER — NITROGLYCERIN 0.4 MG SL SUBL
SUBLINGUAL_TABLET | SUBLINGUAL | Status: AC
  Filled 2014-09-02: qty 1

## 2014-09-02 NOTE — ED Notes (Signed)
CN, [place] , advised of pending EMS transport for patient w chest pain

## 2014-09-02 NOTE — Discharge Instructions (Signed)
We have determined that your problem requires further evaluation in the emergency department.  We will take care of your transport there.  Once at the emergency department, you will be evaluated by a provider and they will order whatever treatment or tests they deem necessary.  We cannot guarantee that they will do any specific test or do any specific treatment.  ° °

## 2014-09-02 NOTE — ED Notes (Signed)
Patient transported to X-ray 

## 2014-09-02 NOTE — ED Provider Notes (Signed)
Chief Complaint   Chest Pain   History of Present Illness    [name] [name] is a 58 year old male with hepatitis C, hypercholesterolemia, prior cigarette smoker, and COPD who presents with a 2 to three-day history of intermittent left pectoral chest pain with radiation down his left arm with numbness and tingling in the arm. The pain is nonexertional. It's relieved by Tums and alprazolam. It's described as a tightness. Has been associated with some shortness of breath. It is pleuritic at times. He also has had palpitations, dizziness, and lightheadedness. He denies any nausea or diaphoresis. No pain in his upper back .He's had no fever, chills, URI symptoms, coughing, or wheezing. He denies any syncope. No abdominal pain, indigestion, heartburn, nausea, or vomiting. He denies any leg pain or swelling. He's had no prior history of heart disease. He denies any history of high blood pressure, diabetes, or family history of heart disease.  Review of Systems    Other than noted above, the patient denies any of the following symptoms. Systemic:  No fever or chills. Pulmonary:  No cough, wheezing, shortness of breath, sputum production, hemoptysis. Cardiac:  No palpitations, rapid heartbeat, dizziness, presyncope or syncope. GI:  No abdominal pain, heartburn, nausea, or vomiting. Ext:  No leg pain or swelling.  [place]    Past medical history, family history, social history, meds, and allergies were reviewed. He is on pravastatin, alprazolam, and Harvoni.  Physical Exam     Vital signs:  BP 127/92 mmHg  Pulse 81  Temp(Src) 98.5 F (36.9 C) (Oral)  SpO2 100% Gen:  Alert, oriented, in no distress, skin warm and dry. ENT:  Mucous membranes moist, pharynx clear. Neck:  Supple, no adenopathy or tenderness.  No JVD. Lungs:  Clear to auscultation, no wheezes, rales or rhonchi.  No respiratory distress. Heart:  Regular rhythm.  No gallops, murmers, clicks or rubs. Chest:  Mild tenderness to  palpation in left pectoral area. Abdomen:  Soft, nontender, no organomegaly or mass.  Bowel sounds normal.  No pulsatile abdominal mass or bruit. Ext:  No edema.  No calf tenderness and Homann's sign negative.  Pulses full and equal. Skin:  Warm and dry.  No rash.   EKG Results:  Date: 09/02/2014  Rate: 80  Rhythm: normal sinus rhythm  QRS Axis: normal--92  Intervals: normal--QTc interval 470 ms  ST/T Wave abnormalities: normal  Conduction Disutrbances:none  Narrative Interpretation: Normal sinus rhythm, possible left atrial enlargement, rightward axis.  Old EKG Reviewed: none available    Course in Urgent [place]   The following medications were given:  Medications  aspirin chewable tablet 324 mg   nitroGLYCERIN (NITROSTAT) SL tablet 0.4 mg   0.9 %  sodium chloride infusion  Assessment     The encounter diagnosis was Chest pain, unspecified chest pain type.  Differential diagnosis is acute coronary syndrome, pulmonary embolism, ruptured aneurysm, pneumothorax, Boerhaave syndrome, pericarditis, musculoskeletal pain, or reflux esophagitis.   Plan     The patient was transferred to the ED via EMS in stable condition.  Medical Decision Making:  58 year old male with Hep C, hypercholesterolemia, and ex-cigarette smoker presents with 2 day history of intermittent tightness in left pectoral area radiating down left arm with numbness and tingling.  Associated with shortness of breath.  No nausea or diaphoresis.  Pain is pleuritic.  No leg pain or swelling.  No history of cardiac disease, but several risk factors as above.  EKG was norma.  We will give TNG and ASA and start IV.  Transfer via EMS.      Harden Mo, MD 09/02/14 [phone]

## 2014-09-02 NOTE — ED Notes (Signed)
Per EMS- Pt comes from [facility], c/o cp x 2 days that is intermittent, describes left midclavicular pain. Denies pain at current. Given 2 nitro total chest pressure relieved. Given 324 aspirin at [employer] EKG SR with t-wave inversion avR, V1. Is a x 4

## 2014-09-02 NOTE — ED Provider Notes (Signed)
CSN: [id number]     Arrival date & time 09/02/14  1315 History   First MD Initiated Contact with Patient 09/02/14 1320     Chief Complaint  Patient presents with  . Chest Pain     (Consider location/radiation/quality/duration/timing/severity/associated sxs/prior Treatment) HPI Comments: Patient from urgent care with a 2 day history of intermittent left sided chest pain associated with numbness and tingling in his arm. The pain is not exertional. It is relieved by Tums. It is worse with palpation and movement. The pain lasts several minutes to hours at a time. It is worse with arm movement. It is not pleuritic. It is not associated with exertion. He denies any cough or fever, chills, nausea or vomiting. Denies any leg pain or leg swelling. No history of heart disease. He has a history of hepatitis C, COPD. He has never had a stress test. No recent travel.  The history is provided by the patient and the EMS personnel.    Past Medical History  Diagnosis Date  . Stomach ulcer   . Hepatitis   . Hepatitis C   . COPD (chronic obstructive pulmonary disease)   . Sinus congestion    Past Surgical History  Procedure Laterality Date  . Hernia repair     No family history on file. History  Substance Use Topics  . Smoking status: Former Smoker -- 0.50 packs/day for 30 years    Types: Cigarettes    Quit date: 08/04/2013  . Smokeless tobacco: Never Used  . Alcohol Use: No     Comment: QUIT    Review of Systems  Constitutional: Negative for fever, activity change and appetite change.  HENT: Negative for congestion and rhinorrhea.   Eyes: Negative for visual disturbance.  Respiratory: Positive for chest tightness and shortness of breath.   Cardiovascular: Positive for chest pain. Negative for leg swelling.  Gastrointestinal: Negative for nausea, vomiting and abdominal pain.  Genitourinary: Negative for dysuria and hematuria.  Musculoskeletal: Negative for myalgias, back pain and  arthralgias.  Skin: Negative for rash.  Neurological: Negative for dizziness, weakness, light-headedness and numbness.  A complete 10 system review of systems was obtained and all systems are negative except as noted in the HPI and PMH.      Allergies  Review of patient's allergies indicates no known allergies.  Home Medications   Prior to Admission medications   Medication Sig Start Date End Date Taking? Authorizing Provider  albuterol (PROAIR HFA) 108 (90 BASE) MCG/ACT inhaler 2 puffs every 4 hours as needed only  if your can't catch your breath Patient taking differently: Inhale 2 puffs into the lungs every 4 (four) hours as needed for wheezing or shortness of breath.  03/16/14  Yes Tanda Rockers, MD  ALPRAZolam Duanne Moron) 0.5 MG tablet Take 0.25 mg by mouth 2 (two) times daily as needed for anxiety.   Yes Historical Provider, MD  Ledipasvir-Sofosbuvir (HARVONI) 90-400 MG TABS Take 1 tablet by mouth daily.   Yes Historical Provider, MD  pravastatin (PRAVACHOL) 20 MG tablet Take 20 mg by mouth every evening.   Yes Historical Provider, MD  clonazePAM (KLONOPIN) 0.5 MG tablet Take 1 tablet (0.5 mg total) by mouth 2 (two) times daily as needed for anxiety. Patient not taking: Reported on 09/02/2014 03/25/14   Tanda Rockers, MD  famotidine (PEPCID) 20 MG tablet One twice daily after meals Patient not taking: Reported on 09/02/2014 03/26/14   Tanda Rockers, MD   BP 101/70 mmHg  Pulse 51  Temp(Src) 99.1 F (37.3 C)  Resp 13  Ht 6' 1.5" (1.867 m)  Wt 168 lb (76.204 kg)  BMI 21.86 kg/m2  SpO2 98% Physical Exam  Constitutional: He is oriented to person, place, and time. He appears well-developed and well-nourished. No distress.  HENT:  Head: Normocephalic and atraumatic.  Mouth/Throat: Oropharynx is clear and moist. No oropharyngeal exudate.  Eyes: Conjunctivae and EOM are normal. Pupils are equal, round, and reactive to light.  Neck: Normal range of motion. Neck supple.  No  meningismus.  Cardiovascular: Normal rate, regular rhythm, normal heart sounds and intact distal pulses.   No murmur heard. Pulmonary/Chest: Effort normal and breath sounds normal. No respiratory distress. He exhibits tenderness.  Reproducible L chest wall tenderness  Abdominal: Soft. There is no tenderness. There is no rebound and no guarding.  Musculoskeletal: Normal range of motion. He exhibits no edema or tenderness.  Neurological: He is alert and oriented to person, place, and time. No cranial nerve deficit. He exhibits normal muscle tone. Coordination normal.  No ataxia on finger to nose bilaterally. No pronator drift. 5/5 strength throughout. CN 2-12 intact. Negative Romberg. Equal grip strength. Sensation intact. Gait is normal.   Skin: Skin is warm.  Psychiatric: He has a normal mood and affect. His behavior is normal.  Nursing note and vitals reviewed.   ED Course  Procedures (including critical care time) Labs Review Labs Reviewed  CBC  BASIC METABOLIC PANEL  D-DIMER, QUANTITATIVE  TROPONIN I  I-STAT TROPOININ, ED    Imaging Review Dg Chest 2 View  09/02/2014   CLINICAL DATA:  Difficulty breathing and chest pain radiating into left arm for 2 days  EXAM: CHEST  2 VIEW  COMPARISON:  March 12, 2014  FINDINGS: There is evidence of a degree of underlying emphysematous change. There is no appreciable edema or consolidation. The heart size is normal. The pulmonary vascularity reflects the underlying emphysematous change. No adenopathy. No pneumothorax. No bone lesions.  IMPRESSION: Evidence of a degree of underlying emphysematous change. No edema or consolidation.   Electronically Signed   By: Lowella Grip M.D.   On: 09/02/2014 13:59     EKG Interpretation   Date/Time:  Wednesday September 02 2014 13:19:10 EST Ventricular Rate:  63 PR Interval:  134 QRS Duration: 97 QT Interval:  393 QTC Calculation: 402 R Axis:   98 Text Interpretation:  Sinus rhythm Consider right  ventricular hypertrophy  No significant change was found Confirmed by Wyvonnia Dusky  MD, Janara Klett [phone])  on 09/02/2014 1:41:45 PM      MDM   Final diagnoses:  Chest pain   58 year old male with hepatitis, high cholesterol, ex-smoker, COPD presenting with intermittent chest pain for the past 2 days lasting for several minutes to hours at a time. Pain is reproducible on exam. EKG is normal sinus rhythm unchanged from his previous.  Chest x-ray negative. Troponin negative and d-dimer negative. EKG is unchanged.  Pain is reproducible and seems atypical for ACS. Patient would benefit from stress test given his age and risk factors though does not need to be inpatient.   Second troponin pending at time of sign out to Dr. Wilson Singer.    Ezequiel Essex, MD 09/02/14 [phone]

## 2014-09-02 NOTE — ED Notes (Signed)
C/o chest pain , comes and goes past couple of days, doesn't want to go away today. "8" left sided chest pain. C/o some SOB.

## 2014-09-02 NOTE — Discharge Instructions (Signed)
Chest Pain (Nonspecific) There is no evidence of a heart attack or blood clot in the lung. Follow-up with your doctor for a stress test. Return to the ED if you develop new or worsening symptoms. It is often hard to give a specific diagnosis for the cause of chest pain. There is always a chance that your pain could be related to something serious, such as a heart attack or a blood clot in the lungs. You need to follow up with your health care provider for further evaluation. CAUSES   Heartburn.  Pneumonia or bronchitis.  Anxiety or stress.  Inflammation around your heart (pericarditis) or lung (pleuritis or pleurisy).  A blood clot in the lung.  A collapsed lung (pneumothorax). It can develop suddenly on its own (spontaneous pneumothorax) or from trauma to the chest.  Shingles infection (herpes zoster virus). The chest wall is composed of bones, muscles, and cartilage. Any of these can be the source of the pain.  The bones can be bruised by injury.  The muscles or cartilage can be strained by coughing or overwork.  The cartilage can be affected by inflammation and become sore (costochondritis). DIAGNOSIS  Lab tests or other studies may be needed to find the cause of your pain. Your health care provider may have you take a test called an ambulatory electrocardiogram (ECG). An ECG records your heartbeat patterns over a 24-hour period. You may also have other tests, such as:  Transthoracic echocardiogram (TTE). During echocardiography, sound waves are used to evaluate how blood flows through your heart.  Transesophageal echocardiogram (TEE).  Cardiac monitoring. This allows your health care provider to monitor your heart rate and rhythm in real time.  Holter monitor. This is a portable device that records your heartbeat and can help diagnose heart arrhythmias. It allows your health care provider to track your heart activity for several days, if needed.  Stress tests by exercise or by  giving medicine that makes the heart beat faster. TREATMENT   Treatment depends on what may be causing your chest pain. Treatment may include:  Acid blockers for heartburn.  Anti-inflammatory medicine.  Pain medicine for inflammatory conditions.  Antibiotics if an infection is present.  You may be advised to change lifestyle habits. This includes stopping smoking and avoiding alcohol, caffeine, and chocolate.  You may be advised to keep your head raised (elevated) when sleeping. This reduces the chance of acid going backward from your stomach into your esophagus. Most of the time, nonspecific chest pain will improve within 2-3 days with rest and mild pain medicine.  HOME CARE INSTRUCTIONS   If antibiotics were prescribed, take them as directed. Finish them even if you start to feel better.  For the next few days, avoid physical activities that bring on chest pain. Continue physical activities as directed.  Do not use any tobacco products, including cigarettes, chewing tobacco, or electronic cigarettes.  Avoid drinking alcohol.  Only take medicine as directed by your health care provider.  Follow your health care provider's suggestions for further testing if your chest pain does not go away.  Keep any follow-up appointments you made. If you do not go to an appointment, you could develop lasting (chronic) problems with pain. If there is any problem keeping an appointment, call to reschedule. SEEK MEDICAL CARE IF:   Your chest pain does not go away, even after treatment.  You have a rash with blisters on your chest.  You have a fever. SEEK IMMEDIATE MEDICAL CARE IF:  You have increased chest pain or pain that spreads to your arm, neck, jaw, back, or abdomen.  You have shortness of breath.  You have an increasing cough, or you cough up blood.  You have severe back or abdominal pain.  You feel nauseous or vomit.  You have severe weakness.  You faint.  You have  chills. This is an emergency. Do not wait to see if the pain will go away. Get medical help at once. Call your local emergency services (911 in [place] Do not drive yourself to the hospital. MAKE SURE YOU:   Understand these instructions.  Will watch your condition.  Will get help right away if you are not doing well or get worse. Document Released: 05/31/2005 Document Revised: 08/26/2013 Document Reviewed: 03/26/2008 [facility] Patient Information 2015 [place], [place]. This information is not intended to replace advice given to you by your health care provider. Make sure you discuss any questions you have with your health care provider.

## 2014-09-11 ENCOUNTER — Emergency Department (HOSPITAL_COMMUNITY): Payer: No Typology Code available for payment source

## 2014-09-11 ENCOUNTER — Emergency Department (HOSPITAL_COMMUNITY)
Admission: EM | Admit: 2014-09-11 | Discharge: 2014-09-11 | Disposition: A | Discharge status: Home / Self Care | Payer: No Typology Code available for payment source | Attending: Emergency Medicine | Admitting: Emergency Medicine

## 2014-09-11 ENCOUNTER — Encounter (HOSPITAL_COMMUNITY): Payer: Self-pay | Admitting: *Deleted

## 2014-09-11 DIAGNOSIS — Z79899 Other long term (current) drug therapy: Secondary | ICD-10-CM | POA: Insufficient documentation

## 2014-09-11 DIAGNOSIS — R079 Chest pain, unspecified: Secondary | ICD-10-CM

## 2014-09-11 DIAGNOSIS — Z87891 Personal history of nicotine dependence: Secondary | ICD-10-CM | POA: Insufficient documentation

## 2014-09-11 DIAGNOSIS — Z8619 Personal history of other infectious and parasitic diseases: Secondary | ICD-10-CM | POA: Insufficient documentation

## 2014-09-11 DIAGNOSIS — J449 Chronic obstructive pulmonary disease, unspecified: Secondary | ICD-10-CM | POA: Insufficient documentation

## 2014-09-11 DIAGNOSIS — Z8719 Personal history of other diseases of the digestive system: Secondary | ICD-10-CM | POA: Insufficient documentation

## 2014-09-11 DIAGNOSIS — F419 Anxiety disorder, unspecified: Secondary | ICD-10-CM | POA: Insufficient documentation

## 2014-09-11 DIAGNOSIS — R0789 Other chest pain: Secondary | ICD-10-CM | POA: Insufficient documentation

## 2014-09-11 HISTORY — DX: Anxiety disorder, unspecified: F41.9

## 2014-09-11 LAB — BASIC METABOLIC PANEL
ANION GAP: 13 (ref 5–15)
BUN: 7 mg/dL (ref 6–23)
CALCIUM: 10 mg/dL (ref 8.4–10.5)
CO2: 26 mmol/L (ref 19–32)
Chloride: 99 mEq/L (ref 96–112)
Creatinine, Ser: 0.81 mg/dL (ref 0.50–1.35)
GFR calc non Af Amer: >90 mL/min (ref >90)
Glucose, Bld: 61 mg/dL — ABNORMAL LOW (ref 70–99)
POTASSIUM: 4.6 mmol/L (ref 3.5–5.1)
SODIUM: 138 mmol/L (ref 135–145)

## 2014-09-11 LAB — CBC
HEMATOCRIT: 46.7 % (ref 39.0–52.0)
Hemoglobin: 16.4 g/dL (ref 13.0–17.0)
MCH: 31.1 pg (ref 26.0–34.0)
MCHC: 35.1 g/dL (ref 30.0–36.0)
MCV: 88.6 fL (ref 78.0–100.0)
Platelets: 231 10*3/uL (ref 150–400)
RBC: 5.27 MIL/uL (ref 4.22–5.81)
RDW: 12.8 % (ref 11.5–15.5)
WBC: 7.7 10*3/uL (ref 4.0–10.5)

## 2014-09-11 LAB — I-STAT TROPONIN, ED
Troponin i, poc: 0 ng/mL (ref 0.00–0.08)
Troponin i, poc: 0.01 ng/mL (ref 0.00–0.08)

## 2014-09-11 LAB — BRAIN NATRIURETIC PEPTIDE: B NATRIURETIC PEPTIDE 5: 23.4 pg/mL (ref 0.0–100.0)

## 2014-09-11 MED ORDER — ASPIRIN 325 MG PO TABS: 325.0000 mg | ORAL_TABLET | ORAL | Status: DC

## 2014-09-11 MED ORDER — NITROGLYCERIN 0.4 MG SL SUBL: 0.4000 mg | SUBLINGUAL_TABLET | SUBLINGUAL | Status: DC | PRN

## 2014-09-11 NOTE — Discharge Instructions (Signed)
Take all of your medications as prescribed. Follow-up with your primary care physician.  i also recommend that you follow-up with cardiology. Return to the ED for new concerns.

## 2014-09-11 NOTE — ED Provider Notes (Signed)
CSN: [id number]     Arrival date & time 09/11/14  1117 History   First MD Initiated Contact with Patient 09/11/14 1405     Chief Complaint  Patient presents with  . Chest Pain     (Consider location/radiation/quality/duration/timing/severity/associated sxs/prior Treatment) The history is provided by the patient and medical records.    This is a 59 year old male with past medical history significant for COPD, anxiety, hepatitis C, presenting to the ED for chest pain. Patient states this pain has been intermittent for the past several weeks. He states he was seen in the ED on 09/02/14 and referred to cardiology, however his PCP told him this was not necessary.  States when pain occurs it is localized to the left side of his chest, lasting anywhere from a few seconds to a few minutes at a time.  Pain worse with palpation of chest wall and movement of left arm.  He denies associated SOB, palpitations, lightheadedness, dizziness, diaphoresis, nausea, or vomiting. Patient has no known cardiac history. He is a former smoker. No recent travel, large cavity edema, calf pain. No prior history of DVT or PE.  VS stable on arrival.  Past Medical History  Diagnosis Date  . Stomach ulcer   . Hepatitis   . Hepatitis C   . COPD (chronic obstructive pulmonary disease)   . Sinus congestion   . Anxiety    Past Surgical History  Procedure Laterality Date  . Hernia repair     History reviewed. No pertinent family history. History  Substance Use Topics  . Smoking status: Former Smoker -- 0.50 packs/day for 30 years    Types: Cigarettes    Quit date: 08/04/2013  . Smokeless tobacco: Never Used  . Alcohol Use: No     Comment: QUIT    Review of Systems  Cardiovascular: Positive for chest pain.  All other systems reviewed and are negative.     Allergies  Review of patient's allergies indicates no known allergies.  Home Medications   Prior to Admission medications   Medication Sig Start Date  End Date Taking? Authorizing Provider  albuterol (PROAIR HFA) 108 (90 BASE) MCG/ACT inhaler 2 puffs every 4 hours as needed only  if your can't catch your breath Patient taking differently: Inhale 2 puffs into the lungs every 4 (four) hours as needed for wheezing or shortness of breath.  03/16/14   Tanda Rockers, MD  ALPRAZolam Duanne Moron) 0.5 MG tablet Take 0.25 mg by mouth 2 (two) times daily as needed for anxiety.    Historical Provider, MD  clonazePAM (KLONOPIN) 0.5 MG tablet Take 1 tablet (0.5 mg total) by mouth 2 (two) times daily as needed for anxiety. Patient not taking: Reported on 09/02/2014 03/25/14   Tanda Rockers, MD  famotidine (PEPCID) 20 MG tablet One twice daily after meals Patient not taking: Reported on 09/02/2014 03/26/14   Tanda Rockers, MD  Ledipasvir-Sofosbuvir (HARVONI) 90-400 MG TABS Take 1 tablet by mouth daily.    Historical Provider, MD  pravastatin (PRAVACHOL) 20 MG tablet Take 20 mg by mouth every evening.    Historical Provider, MD   BP 114/87 mmHg  Pulse 88  Temp(Src) 98 F (36.7 C) (Oral)  Resp 18  Ht 6\' 1"  (1.854 m)  Wt 165 lb (74.844 kg)  BMI 21.77 kg/m2  SpO2 97%   Physical Exam  Constitutional: He is oriented to person, place, and time. He appears well-developed and well-nourished. No distress.  HENT:  Head: Normocephalic and atraumatic.  Mouth/Throat: Oropharynx is clear and moist.  Eyes: Conjunctivae and EOM are normal. Pupils are equal, round, and reactive to light.  Neck: Normal range of motion. Neck supple.  Cardiovascular: Normal rate, regular rhythm and normal heart sounds.   Pulmonary/Chest: Effort normal and breath sounds normal. No respiratory distress. He has no wheezes.  Pain reproducible with palpation of left chest wall and movement of left arm  Abdominal: Soft. Bowel sounds are normal. There is no tenderness. There is no guarding.  Musculoskeletal: Normal range of motion.  No calf asymmetry, tenderness, or palpable cords; no overlying  erythema or warmth to touch; DP pulses intact bilaterally  Neurological: He is alert and oriented to person, place, and time.  Skin: Skin is warm and dry. He is not diaphoretic.  Psychiatric: He has a normal mood and affect.  Nursing note and vitals reviewed.   ED Course  Procedures (including critical care time) Labs Review Labs Reviewed  BASIC METABOLIC PANEL - Abnormal; Notable for the following:    Glucose, Bld 61 (*)    All other components within normal limits  CBC  BRAIN NATRIURETIC PEPTIDE  I-STAT TROPOININ, ED  Randolm Idol, ED    Imaging Review Dg Chest 2 View  09/11/2014   CLINICAL DATA:  60 year old male with chest pain radiating to the back and down his left upper extremity with shortness of breath. Initial encounter.  EXAM: CHEST  2 VIEW  COMPARISON:  09/02/2014 and earlier.  FINDINGS: Stable lung volumes. Stable cardiac size and mediastinal contours. Suggestion of chronic left atrial enlargement on the lateral view. No pneumothorax, pulmonary edema, pleural effusion or acute pulmonary opacity. Stable increased interstitial markings. Stable mild apical scarring. No acute osseous abnormality identified.  IMPRESSION: No acute cardiopulmonary abnormality.   Electronically Signed   By: Lars Pinks M.D.   On: 09/11/2014 15:17     EKG Interpretation None      MDM   Final diagnoses:  Chest pain   59 year old male with chest pain. Seen in the ED recently for the same. He reports his been intermittent over the past few weeks. On exam, patient no acute distress. His vital signs are stable on room air. His pain is reproducible with palpation of his left chest wall as well as movement of his left arm.  EKG sinus rhythm without ischemic change. Labwork is reassuring. Chest x-ray is clear. Initial and delta troponin negative.  Patient has no history or known risk factors for DVT or PE, no tachycardia or hypoxia in the ED.  At this time low suspicion for ACS, PE, dissection, or  other acute cardiac event. Patient will be discharged home, instructed to follow-up with his primary care physician. He was also given referral to cardiology.  Discussed plan with patient, he/she acknowledged understanding and agreed with plan of care.  Return precautions given for new or worsening symptoms.  Larene Pickett, PA-C 09/11/14 [place], MD 09/11/14 1739

## 2014-09-11 NOTE — ED Notes (Signed)
Pt reports Cp that radiates into Lt arm. Pt reports a nagging feeling and denies any othe SX's

## 2014-09-15 NOTE — Progress Notes (Signed)
Patient ID: [name] [name], male   DOB: 07-25-1956, 59 y.o.   MRN: [id number]    59 y.o.  male with hepatitis, high cholesterol, ex-smoker, COPD seen in ER 09/02/14  with intermittent chest pain for  2 days lasting for several minutes to hours at a time. Pain is reproducible on exam. EKG is normal sinus rhythm unchanged from his previous.The pain is not exertional. It is relieved by Tums. It is worse with palpation and movement. The pain lasts several minutes to hours at a time. It is worse with arm movement. It is not pleuritic. It is not associated with exertion. He denies any cough or fever, chills, nausea or vomiting. Denies any leg pain or leg swelling. No history of heart disease. He has a history of hepatitis C, COPD. He has never had a stress test. No recent travel.  Chest x-ray negative. Troponin negative and d-dimer negative. EKG is unchanged.  Pain is reproducible and seems atypical for ACS.  ER d/c with thoughts that outpatient stress test may be useful  On pepcid with history of PUD Since d/c continues to have atypical pain not relieved with motrin Sees Dr Marlou Sa who has him finishing up some antibiotics    ROS: Denies fever, malais, weight loss, blurry vision, decreased visual acuity, cough, sputum, SOB, hemoptysis, pleuritic pain, palpitaitons, heartburn, abdominal pain, melena, lower extremity edema, claudication, or rash.  All other systems reviewed and negative   General: Affect appropriate Healthy:  appears stated age 8: normal Neck supple with no adenopathy JVP normal no bruits no thyromegaly Lungs clear with no wheezing and good diaphragmatic motion Heart:  S1/S2 no murmur,rub, gallop or click PMI normal Abdomen: benighn, BS positve, no tenderness, no AAA no bruit.  No HSM or HJR Distal pulses intact with no bruits No edema Neuro non-focal Skin warm and dry No muscular weakness  Medications Current Outpatient Prescriptions  Medication Sig Dispense Refill  .  albuterol (PROAIR HFA) 108 (90 BASE) MCG/ACT inhaler 2 puffs every 4 hours as needed only  if your can't catch your breath (Patient taking differently: Inhale 2 puffs into the lungs every 4 (four) hours as needed for wheezing or shortness of breath. ) 1 Inhaler 11  . ALPRAZolam (XANAX) 0.5 MG tablet Take 0.25 mg by mouth 2 (two) times daily as needed for anxiety.    . clonazePAM (KLONOPIN) 0.5 MG tablet Take 1 tablet (0.5 mg total) by mouth 2 (two) times daily as needed for anxiety. (Patient not taking: Reported on 09/02/2014) 30 tablet 0  . famotidine (PEPCID) 20 MG tablet One twice daily after meals (Patient not taking: Reported on 09/02/2014)    . Ledipasvir-Sofosbuvir (HARVONI) 90-400 MG TABS Take 1 tablet by mouth daily.    . pravastatin (PRAVACHOL) 20 MG tablet Take 20 mg by mouth every evening.    . [DISCONTINUED] fluticasone (FLONASE) 50 MCG/ACT nasal spray Place 2 sprays into the nose 2 (two) times daily. 16 g 1   No current facility-administered medications for this visit.    Allergies Review of patient's allergies indicates no known allergies.  Family History: No family history on file.  Social History: History   Social History  . Marital Status: Single    Spouse Name: N/A    Number of Children: N/A  . Years of Education: N/A   Occupational History  . Not on file.   Social History Main Topics  . Smoking status: Former Smoker -- 0.50 packs/day for 30 years    Types:  Cigarettes    Quit date: 08/04/2013  . Smokeless tobacco: Never Used  . Alcohol Use: No     Comment: QUIT  . Drug Use: No  . Sexual Activity: Yes   Other Topics Concern  . Not on file   Social History Narrative    Past Surgical History  Procedure Laterality Date  . Hernia repair      Past Medical History  Diagnosis Date  . Stomach ulcer   . Hepatitis   . Hepatitis C   . COPD (chronic obstructive pulmonary disease)   . Sinus congestion   . Anxiety     Electrocardiogram:  SR rate 89  biatrial enlargement 09/11/14 12/30 SR rate 83 ? RVH normal ST segments 08/15/13 SR rate 74 [id number] normal ST segments   Assessment and Plan

## 2014-09-17 ENCOUNTER — Encounter: Payer: Self-pay | Admitting: Cardiovascular Disease

## 2014-09-17 ENCOUNTER — Ambulatory Visit (INDEPENDENT_AMBULATORY_CARE_PROVIDER_SITE_OTHER): Payer: No Typology Code available for payment source | Admitting: Cardiovascular Disease

## 2014-09-17 VITALS — BP 110/60 | HR 88 | Ht 73.0 in | Wt 165.1 lb

## 2014-09-17 DIAGNOSIS — J449 Chronic obstructive pulmonary disease, unspecified: Secondary | ICD-10-CM

## 2014-09-17 DIAGNOSIS — R079 Chest pain, unspecified: Secondary | ICD-10-CM | POA: Insufficient documentation

## 2014-09-17 NOTE — Assessment & Plan Note (Signed)
Atypical but persistant  Minor changes on ECG  F/U stress echo

## 2014-09-17 NOTE — Assessment & Plan Note (Signed)
Atypical pains may be from COPD exacerbation no pneumonia on CXR exam ok now finish antibiotics and f/u primary

## 2014-09-17 NOTE — Assessment & Plan Note (Signed)
Counseled for less than 10 minutes indicated relationship to atypical chest pain and inflammation in chest Little motivation to quit

## 2014-09-17 NOTE — Patient Instructions (Addendum)
Your physician recommends that you continue on your current medications as directed. Please refer to the Current Medication list given to you today.  Your physician has requested that you have a stress echocardiogram. For further information please visit [email or web address]. Please follow instruction sheet as given.  FOLLOW UP AS NEEDED

## 2014-09-24 ENCOUNTER — Ambulatory Visit (HOSPITAL_BASED_OUTPATIENT_CLINIC_OR_DEPARTMENT_OTHER): Payer: No Typology Code available for payment source | Admitting: Radiology

## 2014-09-24 ENCOUNTER — Ambulatory Visit (HOSPITAL_COMMUNITY): Payer: No Typology Code available for payment source | Attending: Cardiology

## 2014-09-24 DIAGNOSIS — R0989 Other specified symptoms and signs involving the circulatory and respiratory systems: Secondary | ICD-10-CM

## 2014-09-24 DIAGNOSIS — R079 Chest pain, unspecified: Secondary | ICD-10-CM

## 2014-09-24 NOTE — Progress Notes (Signed)
Stress Echocardiogram performed.  

## 2014-09-27 ENCOUNTER — Encounter (HOSPITAL_COMMUNITY): Payer: Self-pay | Admitting: *Deleted

## 2014-09-27 ENCOUNTER — Emergency Department (HOSPITAL_COMMUNITY): Payer: No Typology Code available for payment source

## 2014-09-27 ENCOUNTER — Emergency Department (HOSPITAL_COMMUNITY)
Admission: EM | Admit: 2014-09-27 | Discharge: 2014-09-27 | Disposition: A | Discharge status: Home / Self Care | Payer: No Typology Code available for payment source | Attending: Emergency Medicine | Admitting: Emergency Medicine

## 2014-09-27 DIAGNOSIS — Z87891 Personal history of nicotine dependence: Secondary | ICD-10-CM | POA: Insufficient documentation

## 2014-09-27 DIAGNOSIS — Z8619 Personal history of other infectious and parasitic diseases: Secondary | ICD-10-CM | POA: Insufficient documentation

## 2014-09-27 DIAGNOSIS — R079 Chest pain, unspecified: Secondary | ICD-10-CM

## 2014-09-27 DIAGNOSIS — Z7982 Long term (current) use of aspirin: Secondary | ICD-10-CM | POA: Insufficient documentation

## 2014-09-27 DIAGNOSIS — Z7951 Long term (current) use of inhaled steroids: Secondary | ICD-10-CM | POA: Insufficient documentation

## 2014-09-27 DIAGNOSIS — R040 Epistaxis: Secondary | ICD-10-CM | POA: Insufficient documentation

## 2014-09-27 DIAGNOSIS — J449 Chronic obstructive pulmonary disease, unspecified: Secondary | ICD-10-CM | POA: Insufficient documentation

## 2014-09-27 DIAGNOSIS — Z8719 Personal history of other diseases of the digestive system: Secondary | ICD-10-CM | POA: Insufficient documentation

## 2014-09-27 DIAGNOSIS — F419 Anxiety disorder, unspecified: Secondary | ICD-10-CM | POA: Insufficient documentation

## 2014-09-27 DIAGNOSIS — R0789 Other chest pain: Secondary | ICD-10-CM | POA: Insufficient documentation

## 2014-09-27 DIAGNOSIS — R05 Cough: Secondary | ICD-10-CM | POA: Insufficient documentation

## 2014-09-27 LAB — CBC
HCT: 45.3 % (ref 39.0–52.0)
Hemoglobin: 16.3 g/dL (ref 13.0–17.0)
MCH: 31.8 pg (ref 26.0–34.0)
MCHC: 36 g/dL (ref 30.0–36.0)
MCV: 88.3 fL (ref 78.0–100.0)
Platelets: 177 10*3/uL (ref 150–400)
RBC: 5.13 MIL/uL (ref 4.22–5.81)
RDW: 12.9 % (ref 11.5–15.5)
WBC: 10.9 10*3/uL — ABNORMAL HIGH (ref 4.0–10.5)

## 2014-09-27 LAB — BASIC METABOLIC PANEL
ANION GAP: 10 (ref 5–15)
BUN: <5 mg/dL — ABNORMAL LOW (ref 6–23)
CALCIUM: 10.2 mg/dL (ref 8.4–10.5)
CO2: 27 mmol/L (ref 19–32)
CREATININE: 0.79 mg/dL (ref 0.50–1.35)
Chloride: 103 mmol/L (ref 96–112)
GFR calc non Af Amer: >90 mL/min (ref >90)
Glucose, Bld: 75 mg/dL (ref 70–99)
POTASSIUM: 4.1 mmol/L (ref 3.5–5.1)
Sodium: 140 mmol/L (ref 135–145)

## 2014-09-27 LAB — I-STAT TROPONIN, ED: Troponin i, poc: 0 ng/mL (ref 0.00–0.08)

## 2014-09-27 NOTE — ED Provider Notes (Signed)
CSN: [id number]     Arrival date & time 09/27/14  1312 History   First MD Initiated Contact with Patient 09/27/14 1613     Chief Complaint  Patient presents with  . Chest Pain  . Epistaxis     (Consider location/radiation/quality/duration/timing/severity/associated sxs/prior Treatment) Patient is a 59 y.o. male presenting with chest pain.  Chest Pain Pain location:  L chest Pain quality comment:  Weirdness Radiates to: L arm and l leg. Pain radiates to the back: no   Pain severity:  Moderate Onset quality:  Gradual Duration:  3 weeks Timing:  Constant Progression:  Worsening Chronicity:  New Context comment:  Seen by cardiology and PCP for same Relieved by:  Nothing Worsened by:  Nothing tried Associated symptoms: anxiety and cough (chronically, no change)   Associated symptoms: no abdominal pain, no fever, no nausea, no shortness of breath and not vomiting     Past Medical History  Diagnosis Date  . Stomach ulcer   . Hepatitis   . Hepatitis C   . COPD (chronic obstructive pulmonary disease)   . Sinus congestion   . Anxiety    Past Surgical History  Procedure Laterality Date  . Hernia repair     History reviewed. No pertinent family history. History  Substance Use Topics  . Smoking status: Former Smoker -- 0.50 packs/day for 30 years    Types: Cigarettes    Quit date: 08/04/2013  . Smokeless tobacco: Never Used  . Alcohol Use: No     Comment: QUIT    Review of Systems  Constitutional: Negative for fever.  Respiratory: Positive for cough (chronically, no change). Negative for shortness of breath.   Cardiovascular: Positive for chest pain.  Gastrointestinal: Negative for nausea, vomiting and abdominal pain.  All other systems reviewed and are negative.     Allergies  Review of patient's allergies indicates no known allergies.  Home Medications   Prior to Admission medications   Medication Sig Start Date End Date Taking? Authorizing Provider   albuterol (PROAIR HFA) 108 (90 BASE) MCG/ACT inhaler 2 puffs every 4 hours as needed only  if your can't catch your breath Patient taking differently: Inhale 2 puffs into the lungs every 4 (four) hours as needed for wheezing or shortness of breath.  03/16/14   Tanda Rockers, MD  ALPRAZolam Duanne Moron) 0.5 MG tablet Take 0.25 mg by mouth 2 (two) times daily as needed for anxiety.    Historical Provider, MD  aspirin 81 MG tablet Take 81 mg by mouth daily.    Historical Provider, MD  budesonide-formoterol (SYMBICORT) 160-4.5 MCG/ACT inhaler Inhale 2 puffs into the lungs 2 (two) times daily as needed.    Historical Provider, MD  Cholecalciferol (VITAMIN D3) 1000 UNITS CAPS Take by mouth daily.    Historical Provider, MD  fluticasone (FLONASE) 50 MCG/ACT nasal spray Place into both nostrils daily as needed for allergies or rhinitis.    Historical Provider, MD  Ledipasvir-Sofosbuvir (HARVONI) 90-400 MG TABS Take 1 tablet by mouth daily.    Historical Provider, MD  pravastatin (PRAVACHOL) 20 MG tablet Take 20 mg by mouth every evening.    Historical Provider, MD   BP 123/83 mmHg  Pulse 86  Temp(Src) 98.4 F (36.9 C) (Oral)  Resp 18  SpO2 98% Physical Exam  Constitutional: He is oriented to person, place, and time. He appears well-developed and well-nourished.  HENT:  Head: Normocephalic and atraumatic.  Eyes: Conjunctivae and EOM are normal.  Neck: Normal range of motion.  Neck supple.  Cardiovascular: Normal rate, regular rhythm and normal heart sounds.   Pulmonary/Chest: Effort normal and breath sounds normal. No respiratory distress. He exhibits tenderness (l sided).  Abdominal: He exhibits no distension. There is no tenderness. There is no rebound and no guarding.  Musculoskeletal: Normal range of motion.  Neurological: He is alert and oriented to person, place, and time. He has normal strength and normal reflexes. No cranial nerve deficit or sensory deficit. Coordination normal. GCS eye subscore  is 4. GCS verbal subscore is 5. GCS motor subscore is 6.  Skin: Skin is warm and dry.  Vitals reviewed.   ED Course  Procedures (including critical care time) Labs Review Labs Reviewed  CBC - Abnormal; Notable for the following:    WBC 10.9 (*)    All other components within normal limits  BASIC METABOLIC PANEL - Abnormal; Notable for the following:    BUN <5 (*)    All other components within normal limits  I-STAT TROPOININ, ED    Imaging Review Dg Chest 2 View  09/27/2014   CLINICAL DATA:  Chest pain  EXAM: CHEST  2 VIEW  COMPARISON:  09/11/2014  FINDINGS: Cardiomediastinal silhouette is stable. No acute infiltrate or pleural effusion. No pulmonary edema. Bony thorax is unremarkable.  IMPRESSION: No active cardiopulmonary disease.   Electronically Signed   By: Lahoma Crocker M.D.   On: 09/27/2014 14:22     EKG Interpretation   Date/Time:  Sunday September 27 2014 13:30:33 EST Ventricular Rate:  87 PR Interval:  136 QRS Duration: 86 QT Interval:  344 QTC Calculation: 413 R Axis:   98 Text Interpretation:  Normal sinus rhythm with sinus arrhythmia Biatrial  enlargement Rightward axis Abnormal ECG No significant change since last  tracing Confirmed by Debby Freiberg [phone]) on 09/27/2014 4:42:27 PM      MDM   Final diagnoses:  Chest pain, unspecified chest pain type    59 y.o. male with pertinent PMH of COPD, Hep C (currently on treatment), PUD presents with continued symptoms of chest pain.  He has been seen by cardiology for the same. He states his symptoms worsened over the last 2 days.  On arrival vital signs and physical exam as above.  Pt tearful, appears anxious.    No new symptoms in last 6 hours.  Troponin negative.  Pt does not endorse worsening dyspnea. He has not had fever or increasing productive cough. Unknown etiology of chest pain at this time, however doubt ACS, PNA, AAA, PTX, or other emergent chest pathology.  We have discussed return precautions for any  worsening symptoms, fever, or change in nature of symptoms, and pt voices understanding and agrees to fu.   I have reviewed all laboratory and imaging studies if ordered as above  1. Chest pain, unspecified chest pain type   2. Chest pain         Debby Freiberg, MD 09/27/14 [phone]

## 2014-09-27 NOTE — Discharge Instructions (Signed)

## 2014-09-27 NOTE — ED Notes (Signed)
Pt reports recently seen for same, has left side chest pain that radiates into left arm, was referred to cardiologist and sent for stress test but pt doesn't know the results yet. Pt reports unable to get comfortable, pain increases with movement and pt now also having nosebleeds.

## 2014-09-29 ENCOUNTER — Telehealth: Payer: Self-pay | Admitting: Cardiovascular Disease

## 2014-09-29 NOTE — Telephone Encounter (Signed)
PT  AWARE  OF  STRESS  ECHO RESULTS  .Adonis Housekeeper

## 2014-09-29 NOTE — Telephone Encounter (Signed)
New problem   Pt want to know results of her echo stress she had done on 09/24/14. Please advise pt

## 2014-10-02 ENCOUNTER — Other Ambulatory Visit: Payer: Self-pay | Admitting: Internal Medicine

## 2014-10-02 DIAGNOSIS — R079 Chest pain, unspecified: Secondary | ICD-10-CM

## 2014-10-06 ENCOUNTER — Ambulatory Visit
Admission: RE | Admit: 2014-10-06 | Discharge: 2014-10-06 | Disposition: A | Discharge status: Home / Self Care | Payer: No Typology Code available for payment source | Source: Ambulatory Visit | Attending: Internal Medicine | Admitting: Internal Medicine

## 2014-10-06 DIAGNOSIS — R079 Chest pain, unspecified: Secondary | ICD-10-CM

## 2014-10-06 MED ORDER — IOHEXOL 300 MG/ML  SOLN
75.0000 mL | Freq: Once | INTRAMUSCULAR | Status: AC | PRN
  Administered 2014-10-06: 75 mL via INTRAVENOUS

## 2014-10-07 ENCOUNTER — Institutional Professional Consult (permissible substitution): Payer: No Typology Code available for payment source | Admitting: Cardiology

## 2014-10-15 ENCOUNTER — Other Ambulatory Visit: Payer: Self-pay | Admitting: Internal Medicine

## 2014-10-15 DIAGNOSIS — R079 Chest pain, unspecified: Secondary | ICD-10-CM

## 2014-10-20 ENCOUNTER — Emergency Department (INDEPENDENT_AMBULATORY_CARE_PROVIDER_SITE_OTHER)
Admission: EM | Admit: 2014-10-20 | Discharge: 2014-10-20 | Disposition: A | Discharge status: Home / Self Care | Payer: No Typology Code available for payment source | Source: Home / Self Care | Attending: Family Medicine | Admitting: Family Medicine

## 2014-10-20 ENCOUNTER — Encounter (HOSPITAL_COMMUNITY): Payer: Self-pay | Admitting: Emergency Medicine

## 2014-10-20 DIAGNOSIS — J069 Acute upper respiratory infection, unspecified: Secondary | ICD-10-CM

## 2014-10-20 DIAGNOSIS — R0981 Nasal congestion: Secondary | ICD-10-CM

## 2014-10-20 DIAGNOSIS — R0982 Postnasal drip: Secondary | ICD-10-CM

## 2014-10-20 DIAGNOSIS — J439 Emphysema, unspecified: Secondary | ICD-10-CM

## 2014-10-20 MED ORDER — PREDNISONE 20 MG PO TABS: ORAL_TABLET | ORAL | Status: DC

## 2014-10-20 NOTE — Discharge Instructions (Signed)
Upper Respiratory Infection, Adult Sudafed PE 10 mg for congestion Use lots of saline nasal spray Zyrtec or Claritin for drainage Robitussin DM for cough and loosen sinus pressure Nasocort or Flonase nasal spray to shrink sinus Prednisone taper dose An upper respiratory infection (URI) is also sometimes known as the common cold. The upper respiratory tract includes the nose, sinuses, throat, trachea, and bronchi. Bronchi are the airways leading to the lungs. Most people improve within 1 week, but symptoms can last up to 2 weeks. A residual cough may last even longer.  CAUSES Many different viruses can infect the tissues lining the upper respiratory tract. The tissues become irritated and inflamed and often become very moist. Mucus production is also common. A cold is contagious. You can easily spread the virus to others by oral contact. This includes kissing, sharing a glass, coughing, or sneezing. Touching your mouth or nose and then touching a surface, which is then touched by another person, can also spread the virus. SYMPTOMS  Symptoms typically develop 1 to 3 days after you come in contact with a cold virus. Symptoms vary from person to person. They may include:  Runny nose.  Sneezing.  Nasal congestion.  Sinus irritation.  Sore throat.  Loss of voice (laryngitis).  Cough.  Fatigue.  Muscle aches.  Loss of appetite.  Headache.  Low-grade fever. DIAGNOSIS  You might diagnose your own cold based on familiar symptoms, since most people get a cold 2 to 3 times a year. Your caregiver can confirm this based on your exam. Most importantly, your caregiver can check that your symptoms are not due to another disease such as strep throat, sinusitis, pneumonia, asthma, or epiglottitis. Blood tests, throat tests, and X-rays are not necessary to diagnose a common cold, but they may sometimes be helpful in excluding other more serious diseases. Your caregiver will decide if any further  tests are required. RISKS AND COMPLICATIONS  You may be at risk for a more severe case of the common cold if you smoke cigarettes, have chronic heart disease (such as heart failure) or lung disease (such as asthma), or if you have a weakened immune system. The very young and very old are also at risk for more serious infections. Bacterial sinusitis, middle ear infections, and bacterial pneumonia can complicate the common cold. The common cold can worsen asthma and chronic obstructive pulmonary disease (COPD). Sometimes, these complications can require emergency medical care and may be life-threatening. PREVENTION  The best way to protect against getting a cold is to practice good hygiene. Avoid oral or hand contact with people with cold symptoms. Wash your hands often if contact occurs. There is no clear evidence that vitamin C, vitamin E, echinacea, or exercise reduces the chance of developing a cold. However, it is always recommended to get plenty of rest and practice good nutrition. TREATMENT  Treatment is directed at relieving symptoms. There is no cure. Antibiotics are not effective, because the infection is caused by a virus, not by bacteria. Treatment may include:  Increased fluid intake. Sports drinks offer valuable electrolytes, sugars, and fluids.  Breathing heated mist or steam (vaporizer or shower).  Eating chicken soup or other clear broths, and maintaining good nutrition.  Getting plenty of rest.  Using gargles or lozenges for comfort.  Controlling fevers with ibuprofen or acetaminophen as directed by your caregiver.  Increasing usage of your inhaler if you have asthma. Zinc gel and zinc lozenges, taken in the first 24 hours of the common cold,  can shorten the duration and lessen the severity of symptoms. Pain medicines may help with fever, muscle aches, and throat pain. A variety of non-prescription medicines are available to treat congestion and runny nose. Your caregiver can  make recommendations and may suggest nasal or lung inhalers for other symptoms.  HOME CARE INSTRUCTIONS   Only take over-the-counter or prescription medicines for pain, discomfort, or fever as directed by your caregiver.  Use a warm mist humidifier or inhale steam from a shower to increase air moisture. This may keep secretions moist and make it easier to breathe.  Drink enough water and fluids to keep your urine clear or pale yellow.  Rest as needed.  Return to work when your temperature has returned to normal or as your caregiver advises. You may need to stay home longer to avoid infecting others. You can also use a face mask and careful hand washing to prevent spread of the virus. SEEK MEDICAL CARE IF:   After the first few days, you feel you are getting worse rather than better.  You need your caregiver's advice about medicines to control symptoms.  You develop chills, worsening shortness of breath, or brown or red sputum. These may be signs of pneumonia.  You develop yellow or brown nasal discharge or pain in the face, especially when you bend forward. These may be signs of sinusitis.  You develop a fever, swollen neck glands, pain with swallowing, or white areas in the back of your throat. These may be signs of strep throat. SEEK IMMEDIATE MEDICAL CARE IF:   You have a fever.  You develop severe or persistent headache, ear pain, sinus pain, or chest pain.  You develop wheezing, a prolonged cough, cough up blood, or have a change in your usual mucus (if you have chronic lung disease).  You develop sore muscles or a stiff neck. Document Released: 02/14/2001 Document Revised: 11/13/2011 Document Reviewed: 11/26/2013 [facility] Patient Information 2015 [place], [place]. This information is not intended to replace advice given to you by your health care provider. Make sure you discuss any questions you have with your health care provider.  Sinusitis Sinusitis is redness, soreness,  and puffiness (inflammation) of the air pockets in the bones of your face (sinuses). The redness, soreness, and puffiness can cause air and mucus to get trapped in your sinuses. This can allow germs to grow and cause an infection.  HOME CARE   Drink enough fluids to keep your pee (urine) clear or pale yellow.  Use a humidifier in your home.  Run a hot shower to create steam in the bathroom. Sit in the bathroom with the door closed. Breathe in the steam 3-4 times a day.  Put a warm, moist washcloth on your face 3-4 times a day, or as told by your doctor.  Use salt water sprays (saline sprays) to wet the thick fluid in your nose. This can help the sinuses drain.  Only take medicine as told by your doctor. GET HELP RIGHT AWAY IF:   Your pain gets worse.  You have very bad headaches.  You are sick to your stomach (nauseous).  You throw up (vomit).  You are very sleepy (drowsy) all the time.  Your face is puffy (swollen).  Your vision changes.  You have a stiff neck.  You have trouble breathing. MAKE SURE YOU:   Understand these instructions.  Will watch your condition.  Will get help right away if you are not doing well or get worse. Document  Released: 02/07/2008 Document Revised: 05/15/2012 Document Reviewed: 03/26/2012 [facility] Patient Information 2015 [place], [place]. This information is not intended to replace advice given to you by your health care provider. Make sure you discuss any questions you have with your health care provider.

## 2014-10-20 NOTE — ED Notes (Signed)
C/o  Head cold.   States started with nose being clogged.   Having head congestion.  Hx  Of  COPD.  States "feel like something is in throat".  No fever, n/v/d.  No relief with otc meds.

## 2014-10-20 NOTE — ED Provider Notes (Signed)
CSN: [id number]     Arrival date & time 10/20/14  5784 History   First MD Initiated Contact with Patient 10/20/14 1016     Chief Complaint  Patient presents with  . URI   (Consider location/radiation/quality/duration/timing/severity/associated sxs/prior Treatment) HPI Comments: 59 year old male with COPD complaining of nasal congestion and paranasal sinus pressure, PND minor sore throat. Denies known fever. Denies shortness of breath or cough. He has been taking Alka-Seltzer cold plus and Robitussin-DM. He is concerned that it is all gone ago to his chest to develop pneumonia or the flu. He did receive a flu vaccine this year.   Past Medical History  Diagnosis Date  . Stomach ulcer   . Hepatitis   . Hepatitis C   . COPD (chronic obstructive pulmonary disease)   . Sinus congestion   . Anxiety    Past Surgical History  Procedure Laterality Date  . Hernia repair     History reviewed. No pertinent family history. History  Substance Use Topics  . Smoking status: Former Smoker -- 0.50 packs/day for 30 years    Types: Cigarettes    Quit date: 08/04/2013  . Smokeless tobacco: Never Used  . Alcohol Use: No     Comment: QUIT    Review of Systems  Constitutional: Positive for activity change. Negative for fever, chills, diaphoresis and fatigue.  HENT: Positive for congestion, postnasal drip and sore throat. Negative for ear pain, facial swelling, rhinorrhea and trouble swallowing.   Eyes: Negative for pain, discharge and redness.  Respiratory: Positive for cough. Negative for chest tightness, shortness of breath and wheezing.   Cardiovascular: Negative.   Gastrointestinal: Negative.   Musculoskeletal: Negative.  Negative for neck pain and neck stiffness.  Neurological: Negative.     Allergies  Review of patient's allergies indicates no known allergies.  Home Medications   Prior to Admission medications   Medication Sig Start Date End Date Taking? Authorizing Provider   ALPRAZolam Duanne Moron) 0.5 MG tablet Take 0.25 mg by mouth 2 (two) times daily as needed for anxiety.   Yes Historical Provider, MD  aspirin 81 MG tablet Take 81 mg by mouth daily.   Yes Historical Provider, MD  Cholecalciferol (VITAMIN D3) 1000 UNITS CAPS Take by mouth daily.   Yes Historical Provider, MD  albuterol (PROAIR HFA) 108 (90 BASE) MCG/ACT inhaler 2 puffs every 4 hours as needed only  if your can't catch your breath Patient taking differently: Inhale 2 puffs into the lungs every 4 (four) hours as needed for wheezing or shortness of breath.  03/16/14   Tanda Rockers, MD  budesonide-formoterol [facility]) 160-4.5 MCG/ACT inhaler Inhale 2 puffs into the lungs 2 (two) times daily as needed.    Historical Provider, MD  fluticasone (FLONASE) 50 MCG/ACT nasal spray Place into both nostrils daily as needed for allergies or rhinitis.    Historical Provider, MD  Ledipasvir-Sofosbuvir (HARVONI) 90-400 MG TABS Take 1 tablet by mouth daily.    Historical Provider, MD  pravastatin (PRAVACHOL) 20 MG tablet Take 20 mg by mouth every evening.    Historical Provider, MD  predniSONE (DELTASONE) 20 MG tablet Take 3 tabs po on first day, 2 tabs second day, 2 tabs third day, 1 tab fourth day, 1 tab 5th day. Take with food. 10/20/14   Janne Napoleon, NP   BP 128/87 mmHg  Pulse 78  Temp(Src) 99.1 F (37.3 C) (Oral)  SpO2 96% Physical Exam  Constitutional: He is oriented to person, place, and time. He appears well-developed  and well-nourished. No distress.  HENT:  Right Ear: External ear normal.  Mouth/Throat: No oropharyngeal exudate.  Oropharynx with scant clear PND, mild erythema and injection.  Eyes: Conjunctivae and EOM are normal.  Neck: Normal range of motion. Neck supple.  Cardiovascular: Normal rate, regular rhythm, normal heart sounds and intact distal pulses.   Pulmonary/Chest: Effort normal and breath sounds normal. No respiratory distress. He has no wheezes. He has no rales.  Musculoskeletal:  Normal range of motion. He exhibits no edema.  Lymphadenopathy:    He has no cervical adenopathy.  Neurological: He is alert and oriented to person, place, and time.  Skin: Skin is warm and dry.  Psychiatric: He has a normal mood and affect.  Vitals reviewed.   ED Course  Procedures (including critical care time) Labs Review Labs Reviewed - No data to display  Imaging Review No results found.   MDM   1. URI (upper respiratory infection)   2. Congestion of nasal sinus   3. PND (post-nasal drip)   4. Pulmonary emphysema, unspecified emphysema type    Sudafed PE 10 mg for congestion Use lots of saline nasal spray Zyrtec or Claritin for drainage Robitussin DM for cough and loosen sinus pressure Nasocort or Flonase nasal spray to shrink sinus Prednisone taper dose    Janne Napoleon, NP 10/20/14 [place], NP 10/20/14 1039

## 2014-12-22 ENCOUNTER — Emergency Department (INDEPENDENT_AMBULATORY_CARE_PROVIDER_SITE_OTHER)
Admission: EM | Admit: 2014-12-22 | Discharge: 2014-12-22 | Disposition: A | Discharge status: Home / Self Care | Payer: No Typology Code available for payment source | Source: Home / Self Care | Attending: Emergency Medicine | Admitting: Emergency Medicine

## 2014-12-22 ENCOUNTER — Encounter (HOSPITAL_COMMUNITY): Payer: Self-pay | Admitting: Emergency Medicine

## 2014-12-22 DIAGNOSIS — J0140 Acute pansinusitis, unspecified: Secondary | ICD-10-CM

## 2014-12-22 MED ORDER — AMOXICILLIN 500 MG PO CAPS: 500.0000 mg | ORAL_CAPSULE | Freq: Three times a day (TID) | ORAL | Status: DC

## 2014-12-22 MED ORDER — FLUTICASONE PROPIONATE 50 MCG/ACT NA SUSP: 1.0000 | Freq: Every day | NASAL | Status: DC

## 2014-12-22 NOTE — Discharge Instructions (Signed)
Your symptoms are coming from sinus issues. Take amoxicillin 3 times a day for the next 7 days. Take Flonase twice a day for the next week, then daily. You can stay on Flonase long-term. Follow-up with your PCP if no improvement in the next week.

## 2014-12-22 NOTE — ED Provider Notes (Signed)
CSN: [id number]     Arrival date & time 12/22/14  1244 History   First MD Initiated Contact with Patient 12/22/14 1424     Chief Complaint  Patient presents with  . Headache  . Facial Pain   (Consider location/radiation/quality/duration/timing/severity/associated sxs/prior Treatment) HPI  He is a 59 year old man here for evaluation of headache. He states he has had a right-sided headache for about the last week. In the last 2 days it has moved to his sinuses. He reports pain and numbness across his central face. No fevers. He has tried one day of Flonase and one day of Zyrtec without improvement. He states he has had a lot of issues with his sinuses over the last several years. At one point, he states it all cleared up after being on Flonase for a while.  Past Medical History  Diagnosis Date  . Stomach ulcer   . Hepatitis   . Hepatitis C   . COPD (chronic obstructive pulmonary disease)   . Sinus congestion   . Anxiety    Past Surgical History  Procedure Laterality Date  . Hernia repair     History reviewed. No pertinent family history. History  Substance Use Topics  . Smoking status: Former Smoker -- 0.50 packs/day for 30 years    Types: Cigarettes    Quit date: 08/04/2013  . Smokeless tobacco: Never Used  . Alcohol Use: No     Comment: QUIT    Review of Systems As in history of present illness Allergies  Review of patient's allergies indicates no known allergies.  Home Medications   Prior to Admission medications   Medication Sig Start Date End Date Taking? Authorizing Provider  albuterol (PROAIR HFA) 108 (90 BASE) MCG/ACT inhaler 2 puffs every 4 hours as needed only  if your can't catch your breath Patient taking differently: Inhale 2 puffs into the lungs every 4 (four) hours as needed for wheezing or shortness of breath.  03/16/14   Tanda Rockers, MD  ALPRAZolam Duanne Moron) 0.5 MG tablet Take 0.25 mg by mouth 2 (two) times daily as needed for anxiety.    Historical  Provider, MD  amoxicillin (AMOXIL) 500 MG capsule Take 1 capsule (500 mg total) by mouth 3 (three) times daily. 12/22/14   Melony Overly, MD  aspirin 81 MG tablet Take 81 mg by mouth daily.    Historical Provider, MD  budesonide-formoterol (SYMBICORT) 160-4.5 MCG/ACT inhaler Inhale 2 puffs into the lungs 2 (two) times daily as needed.    Historical Provider, MD  Cholecalciferol (VITAMIN D3) 1000 UNITS CAPS Take by mouth daily.    Historical Provider, MD  fluticasone (FLONASE) 50 MCG/ACT nasal spray Place 1 spray into both nostrils daily. 12/22/14   Melony Overly, MD  Ledipasvir-Sofosbuvir (HARVONI) 90-400 MG TABS Take 1 tablet by mouth daily.    Historical Provider, MD  pravastatin (PRAVACHOL) 20 MG tablet Take 20 mg by mouth every evening.    Historical Provider, MD   BP 120/84 mmHg  Pulse 60  Temp(Src) 98.2 F (36.8 C) (Oral)  Resp 16  SpO2 99% Physical Exam  Constitutional: He is oriented to person, place, and time. He appears well-developed and well-nourished. No distress.  HENT:  Right Ear: External ear normal.  Left Ear: External ear normal.  Nose: Mucosal edema present. Right sinus exhibits maxillary sinus tenderness and frontal sinus tenderness. Left sinus exhibits maxillary sinus tenderness and frontal sinus tenderness.  Mouth/Throat: Oropharynx is clear and moist. No oropharyngeal exudate.  Neck:  Neck supple.  Cardiovascular: Normal rate, regular rhythm and normal heart sounds.   No murmur heard. Pulmonary/Chest: Effort normal and breath sounds normal. No respiratory distress. He has no wheezes. He has no rales.  Lymphadenopathy:    He has no cervical adenopathy.  Neurological: He is alert and oriented to person, place, and time.    ED Course  Procedures (including critical care time) Labs Review Labs Reviewed - No data to display  Imaging Review No results found.   MDM   1. Acute pansinusitis, recurrence not specified    I suspect his symptoms are all due to sinus  congestion. Will treat with amoxicillin for 7 days. Recommended daily use of Flonase. Follow-up as needed.    Melony Overly, MD 12/22/14 [phone]

## 2014-12-22 NOTE — ED Notes (Signed)
C/o  Having a headache that radiates to right side, on set 2 days. Ago.   Reports pain goes across face and has sensations of tightness and numbness.  Pt tried an allergy pill this a.m with no relief.   Denies fever, n/v/d.

## 2015-01-02 ENCOUNTER — Encounter (HOSPITAL_COMMUNITY): Payer: Self-pay | Admitting: Emergency Medicine

## 2015-01-02 ENCOUNTER — Emergency Department (INDEPENDENT_AMBULATORY_CARE_PROVIDER_SITE_OTHER)
Admission: EM | Admit: 2015-01-02 | Discharge: 2015-01-02 | Disposition: A | Discharge status: Home / Self Care | Payer: No Typology Code available for payment source | Source: Home / Self Care | Attending: Family Medicine | Admitting: Family Medicine

## 2015-01-02 DIAGNOSIS — H6993 Unspecified Eustachian tube disorder, bilateral: Secondary | ICD-10-CM

## 2015-01-02 DIAGNOSIS — H6983 Other specified disorders of Eustachian tube, bilateral: Secondary | ICD-10-CM

## 2015-01-02 NOTE — ED Notes (Signed)
Reports feeling dizzy earlier this afternoon w/a HA He thought it was his blood pressure; checked it at home and "it was 160 something over 90 something" Denies syncope, CP Alert, no signs of acute distress.

## 2015-01-02 NOTE — ED Provider Notes (Signed)
CSN: [id number]     Arrival date & time 01/02/15  1616 History   First MD Initiated Contact with Patient 01/02/15 1738     Chief Complaint  Patient presents with  . Dizziness   (Consider location/radiation/quality/duration/timing/severity/associated sxs/prior Treatment) HPI Comments: Patient states he is currently finishing treatment for sinusitis and while at home today he had a brief episode of dizziness. This made him anxious so he decided to use his at home blood pressure monitor and check his blood pressure. Found that his blood pressure was a bit higher than he is used to (140's/80's). This made him even more anxious. So he decided to walk to [facility] for evaluation. At the time of his visit, his symptoms have resolved.   Patient is a 59 y.o. male presenting with dizziness. The history is provided by the patient.  Dizziness   Past Medical History  Diagnosis Date  . Stomach ulcer   . Hepatitis   . Hepatitis C   . COPD (chronic obstructive pulmonary disease)   . Sinus congestion   . Anxiety    Past Surgical History  Procedure Laterality Date  . Hernia repair     No family history on file. History  Substance Use Topics  . Smoking status: Former Smoker -- 0.50 packs/day for 30 years    Types: Cigarettes    Quit date: 08/04/2013  . Smokeless tobacco: Never Used  . Alcohol Use: No     Comment: QUIT    Review of Systems  Neurological: Positive for dizziness.  All other systems reviewed and are negative.   Allergies  Review of patient's allergies indicates no known allergies.  Home Medications   Prior to Admission medications   Medication Sig Start Date End Date Taking? Authorizing Provider  albuterol (PROAIR HFA) 108 (90 BASE) MCG/ACT inhaler 2 puffs every 4 hours as needed only  if your can't catch your breath Patient taking differently: Inhale 2 puffs into the lungs every 4 (four) hours as needed for wheezing or shortness of breath.  03/16/14   Tanda Rockers, MD   ALPRAZolam Duanne Moron) 0.5 MG tablet Take 0.25 mg by mouth 2 (two) times daily as needed for anxiety.    Historical Provider, MD  amoxicillin (AMOXIL) 500 MG capsule Take 1 capsule (500 mg total) by mouth 3 (three) times daily. 12/22/14   Melony Overly, MD  aspirin 81 MG tablet Take 81 mg by mouth daily.    Historical Provider, MD  budesonide-formoterol (SYMBICORT) 160-4.5 MCG/ACT inhaler Inhale 2 puffs into the lungs 2 (two) times daily as needed.    Historical Provider, MD  Cholecalciferol (VITAMIN D3) 1000 UNITS CAPS Take by mouth daily.    Historical Provider, MD  fluticasone (FLONASE) 50 MCG/ACT nasal spray Place 1 spray into both nostrils daily. 12/22/14   Melony Overly, MD  Ledipasvir-Sofosbuvir (HARVONI) 90-400 MG TABS Take 1 tablet by mouth daily.    Historical Provider, MD  pravastatin (PRAVACHOL) 20 MG tablet Take 20 mg by mouth every evening.    Historical Provider, MD   BP 123/77 mmHg  Pulse 54  Temp(Src) 98.7 F (37.1 C) (Oral)  Resp 16  SpO2 98% Physical Exam  Constitutional: He is oriented to person, place, and time. He appears well-developed and well-nourished. No distress.  HENT:  Head: Normocephalic and atraumatic.  Right Ear: Hearing, external ear and ear canal normal. No drainage or tenderness. No mastoid tenderness. Tympanic membrane is retracted. Tympanic membrane is not injected, not perforated, not erythematous  and not bulging. A middle ear effusion is present.  Left Ear: Hearing, external ear and ear canal normal. No drainage or tenderness. No mastoid tenderness. Tympanic membrane is retracted. Tympanic membrane is not injected, not perforated, not erythematous and not bulging. A middle ear effusion is present.  Nose: Nose normal.  Mouth/Throat: Uvula is midline, oropharynx is clear and moist and mucous membranes are normal.  Eyes: Conjunctivae are normal.  Neck: Normal range of motion. Neck supple.  Cardiovascular: Normal rate, regular rhythm and normal heart sounds.    Pulmonary/Chest: Effort normal and breath sounds normal.  Musculoskeletal: Normal range of motion.  Neurological: He is alert and oriented to person, place, and time. He has normal strength. No cranial nerve deficit. Coordination and gait normal. GCS eye subscore is 4. GCS verbal subscore is 5. GCS motor subscore is 6.  Skin: Skin is warm and dry.  Psychiatric: He has a normal mood and affect. His behavior is normal.  Nursing note and vitals reviewed.   ED Course  Procedures (including critical care time) Labs Review Labs Reviewed - No data to display  Imaging Review No results found.   MDM   1. Eustachian tube dysfunction, bilateral   I suspect his transient episode of dizziness is related to bilateral middle ear serous effusions. Encouraged him to continue Flonase and finish medications as directed. Gave patient blood pressure log and advised him to record daily morning blood pressures and follow up with Dr. Marlou Sa if he continues to have concerns. If symptoms suddenly worse or severe, ER re-eval.   Lutricia Feil, PA 01/02/15 [phone]

## 2015-01-28 ENCOUNTER — Emergency Department (INDEPENDENT_AMBULATORY_CARE_PROVIDER_SITE_OTHER)
Admission: EM | Admit: 2015-01-28 | Discharge: 2015-01-28 | Disposition: A | Discharge status: Home / Self Care | Payer: No Typology Code available for payment source | Source: Home / Self Care | Attending: Family Medicine | Admitting: Family Medicine

## 2015-01-28 ENCOUNTER — Encounter (HOSPITAL_COMMUNITY): Payer: Self-pay | Admitting: Emergency Medicine

## 2015-01-28 DIAGNOSIS — J0101 Acute recurrent maxillary sinusitis: Secondary | ICD-10-CM

## 2015-01-28 MED ORDER — AMOXICILLIN 500 MG PO CAPS: 500.0000 mg | ORAL_CAPSULE | Freq: Three times a day (TID) | ORAL | Status: DC

## 2015-01-28 MED ORDER — PREDNISONE 5 MG PO TABS: 30.0000 mg | ORAL_TABLET | Freq: Every day | ORAL | Status: DC

## 2015-01-28 NOTE — ED Notes (Signed)
C/o sinus problems x 1 wk.  Pt triaged and assessed by provider.  Provider in before nurse.

## 2015-01-28 NOTE — Discharge Instructions (Signed)
Thank you for coming in today. Follow up with the ENT doctor.  Call or go to the emergency room if you get worse, have trouble breathing, have chest pains, or palpitations.   Sinusitis Sinusitis is redness, soreness, and inflammation of the paranasal sinuses. Paranasal sinuses are air pockets within the bones of your face (beneath the eyes, the middle of the forehead, or above the eyes). In healthy paranasal sinuses, mucus is able to drain out, and air is able to circulate through them by way of your nose. However, when your paranasal sinuses are inflamed, mucus and air can become trapped. This can allow bacteria and other germs to grow and cause infection. Sinusitis can develop quickly and last only a short time (acute) or continue over a long period (chronic). Sinusitis that lasts for more than 12 weeks is considered chronic.  CAUSES  Causes of sinusitis include:  Allergies.  Structural abnormalities, such as displacement of the cartilage that separates your nostrils (deviated septum), which can decrease the air flow through your nose and sinuses and affect sinus drainage.  Functional abnormalities, such as when the small hairs (cilia) that line your sinuses and help remove mucus do not work properly or are not present. SIGNS AND SYMPTOMS  Symptoms of acute and chronic sinusitis are the same. The primary symptoms are pain and pressure around the affected sinuses. Other symptoms include:  Upper toothache.  Earache.  Headache.  Bad breath.  Decreased sense of smell and taste.  A cough, which worsens when you are lying flat.  Fatigue.  Fever.  Thick drainage from your nose, which often is green and may contain pus (purulent).  Swelling and warmth over the affected sinuses. DIAGNOSIS  Your health care provider will perform a physical exam. During the exam, your health care provider may:  Look in your nose for signs of abnormal growths in your nostrils (nasal polyps).  Tap over  the affected sinus to check for signs of infection.  View the inside of your sinuses (endoscopy) using an imaging device that has a light attached (endoscope). If your health care provider suspects that you have chronic sinusitis, one or more of the following tests may be recommended:  Allergy tests.  Nasal culture. A sample of mucus is taken from your nose, sent to a lab, and screened for bacteria.  Nasal cytology. A sample of mucus is taken from your nose and examined by your health care provider to determine if your sinusitis is related to an allergy. TREATMENT  Most cases of acute sinusitis are related to a viral infection and will resolve on their own within 10 days. Sometimes medicines are prescribed to help relieve symptoms (pain medicine, decongestants, nasal steroid sprays, or saline sprays).  However, for sinusitis related to a bacterial infection, your health care provider will prescribe antibiotic medicines. These are medicines that will help kill the bacteria causing the infection.  Rarely, sinusitis is caused by a fungal infection. In theses cases, your health care provider will prescribe antifungal medicine. For some cases of chronic sinusitis, surgery is needed. Generally, these are cases in which sinusitis recurs more than 3 times per year, despite other treatments. HOME CARE INSTRUCTIONS   Drink plenty of water. Water helps thin the mucus so your sinuses can drain more easily.  Use a humidifier.  Inhale steam 3 to 4 times a day (for example, sit in the bathroom with the shower running).  Apply a warm, moist washcloth to your face 3 to 4 times  a day, or as directed by your health care provider.  Use saline nasal sprays to help moisten and clean your sinuses.  Take medicines only as directed by your health care provider.  If you were prescribed either an antibiotic or antifungal medicine, finish it all even if you start to feel better. SEEK IMMEDIATE MEDICAL CARE  IF:  You have increasing pain or severe headaches.  You have nausea, vomiting, or drowsiness.  You have swelling around your face.  You have vision problems.  You have a stiff neck.  You have difficulty breathing. MAKE SURE YOU:   Understand these instructions.  Will watch your condition.  Will get help right away if you are not doing well or get worse. Document Released: 08/21/2005 Document Revised: 01/05/2014 Document Reviewed: 09/05/2011 [facility] Patient Information 2015 [place], [place]. This information is not intended to replace advice given to you by your health care provider. Make sure you discuss any questions you have with your health care provider.

## 2015-01-28 NOTE — ED Provider Notes (Signed)
[name] [name] is a 59 y.o. male who presents to Urgent Care today for sinus pain and pressure present for one week. Symptoms come and go and be worse at night. No fevers chills nausea vomiting or diarrhea. No weakness or numbness or loss of function. He started Flonase Tylenol and Advil which have helped only a little. Symptoms are consistent with previous episodes of sinus infections.   Past Medical History  Diagnosis Date  . Stomach ulcer   . Hepatitis   . Hepatitis C   . COPD (chronic obstructive pulmonary disease)   . Sinus congestion   . Anxiety    Past Surgical History  Procedure Laterality Date  . Hernia repair     History  Substance Use Topics  . Smoking status: Former Smoker -- 0.50 packs/day for 30 years    Types: Cigarettes    Quit date: 08/04/2013  . Smokeless tobacco: Never Used  . Alcohol Use: No     Comment: QUIT   ROS as above Medications: No current facility-administered medications for this encounter.   Current Outpatient Prescriptions  Medication Sig Dispense Refill  . albuterol (PROAIR HFA) 108 (90 BASE) MCG/ACT inhaler 2 puffs every 4 hours as needed only  if your can't catch your breath (Patient taking differently: Inhale 2 puffs into the lungs every 4 (four) hours as needed for wheezing or shortness of breath. ) 1 Inhaler 11  . ALPRAZolam (XANAX) 0.5 MG tablet Take 0.25 mg by mouth 2 (two) times daily as needed for anxiety.    Marland Kitchen amoxicillin (AMOXIL) 500 MG capsule Take 1 capsule (500 mg total) by mouth 3 (three) times daily. 30 capsule 0  . aspirin 81 MG tablet Take 81 mg by mouth daily.    . budesonide-formoterol (SYMBICORT) 160-4.5 MCG/ACT inhaler Inhale 2 puffs into the lungs 2 (two) times daily as needed.    . Cholecalciferol (VITAMIN D3) 1000 UNITS CAPS Take by mouth daily.    . fluticasone (FLONASE) 50 MCG/ACT nasal spray Place 1 spray into both nostrils daily. 16 g 2  . Ledipasvir-Sofosbuvir (HARVONI) 90-400 MG TABS Take 1 tablet by mouth daily.     . pravastatin (PRAVACHOL) 20 MG tablet Take 20 mg by mouth every evening.    . predniSONE (DELTASONE) 5 MG tablet Take 6 tablets (30 mg total) by mouth daily. 30 tablet 0   No Known Allergies   Exam:  BP 117/86 mmHg  Pulse 66  Temp(Src) 98.5 F (36.9 C) (Oral)  Resp 12  SpO2 96% Gen: Well NAD HEENT: EOMI,  MMM nasal discharge. Normal posterior pharynx. Tender palpation bilateral maxillary and frontal sinuses. Lungs: Normal work of breathing. CTABL Heart: RRR no MRG Abd: NABS, Soft. Nondistended, Nontender Exts: Brisk capillary refill, warm and well perfused.  Neuro: Normal coordination and gait  No results found for this or any previous visit (from the past 24 hour(s)). No results found.  Assessment and Plan: 59 y.o. male with sinusitis likely viral. Treat with prednisone. Use amoxicillin if not better. Follow-up with primary care provider in [facility] ear nose and throat.  Discussed warning signs or symptoms. Please see discharge instructions. Patient expresses understanding.     Gregor Hams, MD 01/28/15 [phone]

## 2015-01-29 NOTE — ED Notes (Signed)
PHARMACIST   CALLED     REQUESTING  CLARIFICATION ON  PREDNISONE  RX      DR  KINDL  REVIEWED  CHANGE  TO   PREDNISONE   5   MG   NUMBER   21   6  DAY  PACK

## 2015-03-15 ENCOUNTER — Emergency Department (HOSPITAL_COMMUNITY): Payer: No Typology Code available for payment source

## 2015-03-15 ENCOUNTER — Encounter (HOSPITAL_COMMUNITY): Payer: Self-pay | Admitting: Nurse Practitioner

## 2015-03-15 ENCOUNTER — Emergency Department (HOSPITAL_COMMUNITY)
Admission: EM | Admit: 2015-03-15 | Discharge: 2015-03-15 | Disposition: A | Discharge status: Home / Self Care | Payer: No Typology Code available for payment source | Attending: Emergency Medicine | Admitting: Emergency Medicine

## 2015-03-15 DIAGNOSIS — R1031 Right lower quadrant pain: Secondary | ICD-10-CM | POA: Insufficient documentation

## 2015-03-15 DIAGNOSIS — R109 Unspecified abdominal pain: Secondary | ICD-10-CM

## 2015-03-15 DIAGNOSIS — Z8619 Personal history of other infectious and parasitic diseases: Secondary | ICD-10-CM | POA: Insufficient documentation

## 2015-03-15 DIAGNOSIS — Z87891 Personal history of nicotine dependence: Secondary | ICD-10-CM | POA: Insufficient documentation

## 2015-03-15 DIAGNOSIS — Z8659 Personal history of other mental and behavioral disorders: Secondary | ICD-10-CM | POA: Insufficient documentation

## 2015-03-15 DIAGNOSIS — Z8719 Personal history of other diseases of the digestive system: Secondary | ICD-10-CM | POA: Insufficient documentation

## 2015-03-15 DIAGNOSIS — Z7982 Long term (current) use of aspirin: Secondary | ICD-10-CM | POA: Insufficient documentation

## 2015-03-15 DIAGNOSIS — Z79899 Other long term (current) drug therapy: Secondary | ICD-10-CM | POA: Insufficient documentation

## 2015-03-15 DIAGNOSIS — J449 Chronic obstructive pulmonary disease, unspecified: Secondary | ICD-10-CM | POA: Insufficient documentation

## 2015-03-15 LAB — COMPREHENSIVE METABOLIC PANEL
ALBUMIN: 4.2 g/dL (ref 3.5–5.0)
ALT: 22 U/L (ref 17–63)
AST: 29 U/L (ref 15–41)
Alkaline Phosphatase: 49 U/L (ref 38–126)
Anion gap: 6 (ref 5–15)
BUN: 8 mg/dL (ref 6–20)
CHLORIDE: 104 mmol/L (ref 101–111)
CO2: 28 mmol/L (ref 22–32)
Calcium: 9.8 mg/dL (ref 8.9–10.3)
Creatinine, Ser: 0.85 mg/dL (ref 0.61–1.24)
GFR calc Af Amer: >60 mL/min (ref >60)
GFR calc non Af Amer: >60 mL/min (ref >60)
GLUCOSE: 98 mg/dL (ref 65–99)
POTASSIUM: 4.5 mmol/L (ref 3.5–5.1)
SODIUM: 138 mmol/L (ref 135–145)
Total Bilirubin: 0.5 mg/dL (ref 0.3–1.2)
Total Protein: 7 g/dL (ref 6.5–8.1)

## 2015-03-15 LAB — URINALYSIS, ROUTINE W REFLEX MICROSCOPIC
BILIRUBIN URINE: NEGATIVE
GLUCOSE, UA: NEGATIVE mg/dL
HGB URINE DIPSTICK: NEGATIVE
Ketones, ur: NEGATIVE mg/dL
Leukocytes, UA: NEGATIVE
Nitrite: NEGATIVE
PROTEIN: NEGATIVE mg/dL
SPECIFIC GRAVITY, URINE: 1.008 (ref 1.005–1.030)
Urobilinogen, UA: 0.2 mg/dL (ref 0.0–1.0)
pH: 5 (ref 5.0–8.0)

## 2015-03-15 LAB — CBC
HEMATOCRIT: 45.3 % (ref 39.0–52.0)
Hemoglobin: 15.9 g/dL (ref 13.0–17.0)
MCH: 32.1 pg (ref 26.0–34.0)
MCHC: 35.1 g/dL (ref 30.0–36.0)
MCV: 91.5 fL (ref 78.0–100.0)
Platelets: 226 10*3/uL (ref 150–400)
RBC: 4.95 MIL/uL (ref 4.22–5.81)
RDW: 13.3 % (ref 11.5–15.5)
WBC: 8 10*3/uL (ref 4.0–10.5)

## 2015-03-15 NOTE — Discharge Instructions (Signed)
Please call your doctor for a followup appointment within 24-48 hours. When you talk to your doctor please let them know that you were seen in the emergency department and have them acquire all of your records so that they can discuss the findings with you and formulate a treatment plan to fully care for your new and ongoing problems. ° °

## 2015-03-15 NOTE — ED Notes (Addendum)
He states his stomach has felt "tight and bloated" x 1 week. He tried pepto, tums with no relief. He reports a palpable "knot" under skin in RLQ that has gotten bigger over past year, he was told it was a hernia in the past but its not painful. He denies n/v/bowel/bladder changes. He does reports loss of appetite but denies weight loss. He has a colonoscopy scheduled next month

## 2015-03-15 NOTE — ED Notes (Signed)
Pt stable, ambulatory, states understanding of discharge instructions 

## 2015-03-15 NOTE — ED Provider Notes (Signed)
CSN: [id number]     Arrival date & time 03/15/15  1121 History   First MD Initiated Contact with Patient 03/15/15 1449     No chief complaint on file.    (Consider location/radiation/quality/duration/timing/severity/associated sxs/prior Treatment) HPI Comments: The patient is a 59 year old male, he has a history of a stomach ulcer or he was much younger, he required an exploratory surgery after what he describes as a perforation, he also has a history of hepatitis C and COPD. He states that over the last week he has had a mild persistent bloating and fullness to the right lower quadrant, nothing seems to make this better or worse, it does not radiate until today when he started to have some flank pain on the right. He denies dysuria or diarrhea and has had no constipation or blood in the stools. He has had medications including Pepto-Bismol and a stool softener yesterday but states this has not helped his symptoms. The symptoms are mild but gradually worsening, is scheduled for colonoscopy in 1 week, has never had any other intra-abdominal pathology. He denies any postprandial pain, there is been no nausea or vomiting, he has had normal bowel movements.  The history is provided by the patient.    Past Medical History  Diagnosis Date  . Stomach ulcer   . Hepatitis   . Hepatitis C   . COPD (chronic obstructive pulmonary disease)   . Sinus congestion   . Anxiety    Past Surgical History  Procedure Laterality Date  . Hernia repair     History reviewed. No pertinent family history. History  Substance Use Topics  . Smoking status: Former Smoker -- 0.50 packs/day for 30 years    Types: Cigarettes    Quit date: 08/04/2013  . Smokeless tobacco: Never Used  . Alcohol Use: No     Comment: QUIT    Review of Systems  All other systems reviewed and are negative.     Allergies  Review of patient's allergies indicates no known allergies.  Home Medications   Prior to Admission  medications   Medication Sig Start Date End Date Taking? Authorizing Provider  albuterol (PROAIR HFA) 108 (90 BASE) MCG/ACT inhaler 2 puffs every 4 hours as needed only  if your can't catch your breath Patient taking differently: Inhale 2 puffs into the lungs every 4 (four) hours as needed for wheezing or shortness of breath.  03/16/14  Yes Tanda Rockers, MD  aspirin 81 MG tablet Take 81 mg by mouth daily.   Yes Historical Provider, MD  budesonide-formoterol (SYMBICORT) 160-4.5 MCG/ACT inhaler Inhale 2 puffs into the lungs 2 (two) times daily as needed.   Yes Historical Provider, MD  Cholecalciferol (VITAMIN D3) 1000 UNITS CAPS Take by mouth daily.   Yes Historical Provider, MD  fluticasone (FLONASE) 50 MCG/ACT nasal spray Place 1 spray into both nostrils daily. 12/22/14  Yes Melony Overly, MD  amoxicillin (AMOXIL) 500 MG capsule Take 1 capsule (500 mg total) by mouth 3 (three) times daily. Patient not taking: Reported on 03/15/2015 01/28/15   Gregor Hams, MD  predniSONE (DELTASONE) 5 MG tablet Take 6 tablets (30 mg total) by mouth daily. Patient not taking: Reported on 03/15/2015 01/28/15   Gregor Hams, MD   BP 119/76 mmHg  Pulse 51  Temp(Src) 98.2 F (36.8 C) (Oral)  Resp 16  Ht 6\' 1"  (1.854 m)  Wt 167 lb (75.751 kg)  BMI 22.04 kg/m2  SpO2 98% Physical Exam  Constitutional: He  appears well-developed and well-nourished. No distress.  HENT:  Head: Normocephalic and atraumatic.  Mouth/Throat: Oropharynx is clear and moist. No oropharyngeal exudate.  Eyes: Conjunctivae and EOM are normal. Pupils are equal, round, and reactive to light. Right eye exhibits no discharge. Left eye exhibits no discharge. No scleral icterus.  Neck: Normal range of motion. Neck supple. No JVD present. No thyromegaly present.  Cardiovascular: Normal rate, regular rhythm, normal heart sounds and intact distal pulses.  Exam reveals no gallop and no friction rub.   No murmur heard. Pulmonary/Chest: Effort normal and  breath sounds normal. No respiratory distress. He has no wheezes. He has no rales.  Abdominal: Soft. Bowel sounds are normal. He exhibits no distension and no mass. There is tenderness ( Minimal tenderness and fullness to the right lower quadrant, no masses, no tympanitic sounds to percussion, no guarding, normal bowel sounds in all 4 quadrants, symmetrical abdominal wall, well-healed scars to the midline and the left lower quadrant).  Musculoskeletal: Normal range of motion. He exhibits no edema or tenderness.  Lymphadenopathy:    He has no cervical adenopathy.  Neurological: He is alert. Coordination normal.  Skin: Skin is warm and dry. No rash noted. No erythema.  Psychiatric: He has a normal mood and affect. His behavior is normal.  Nursing note and vitals reviewed.   ED Course  Procedures (including critical care time) Labs Review Labs Reviewed  COMPREHENSIVE METABOLIC PANEL  CBC  URINALYSIS, ROUTINE W REFLEX MICROSCOPIC (NOT AT [facility])    Imaging Review Ct Abdomen Pelvis Wo Contrast  03/15/2015   CLINICAL DATA:  Right-sided abdominal and rib pain for 1 week  EXAM: CT ABDOMEN AND PELVIS WITHOUT CONTRAST  TECHNIQUE: Multidetector CT imaging of the abdomen and pelvis was performed following the standard protocol without IV contrast.  COMPARISON:  11/12/2013  FINDINGS: Lung bases are free of acute infiltrate or sizable effusion.  The liver, gallbladder, spleen, adrenal glands and pancreas are within normal limits. The kidneys are well visualized bilaterally without renal calculi or urinary tract obstructive changes.  The appendix is within normal limits. The bladder is partially distended. No pelvic mass lesion or sidewall abnormality is noted. The bony structures demonstrate multiple lucencies similar to those seen on the prior CT examination. No new focal bony abnormality is noted.  IMPRESSION: No acute abnormality noted. The overall appearance is stable when compared with the prior exam.    Electronically Signed   By: [name] Catalina M.D.   On: 03/15/2015 15:48      MDM   Final diagnoses:  Flank pain  Abdominal discomfort    There is no evidence of hernias in the inguinal region, no abdominal wall hernias, no Cornett sign, vital signs are normal, labs are normal, he is starting to have some radiation to the right flank. We'll obtain CT scan of the abdomen and pelvis to rule out other sources such as possible kidney stone, possible intra-abdominal mass, possible aneurysm. Patient is in agreement with the plan.  CT neg, labs neg, VS normal  Pt informed = stable for d/c.  Noemi Chapel, MD 03/15/15 [phone]

## 2015-03-25 ENCOUNTER — Encounter (HOSPITAL_COMMUNITY): Payer: Self-pay | Admitting: General Practice

## 2015-03-25 ENCOUNTER — Other Ambulatory Visit: Payer: Self-pay | Admitting: Gastroenterology

## 2015-03-25 ENCOUNTER — Emergency Department (HOSPITAL_COMMUNITY)
Admission: EM | Admit: 2015-03-25 | Discharge: 2015-03-25 | Disposition: A | Discharge status: Home / Self Care | Payer: No Typology Code available for payment source | Attending: Emergency Medicine | Admitting: Emergency Medicine

## 2015-03-25 DIAGNOSIS — Z8619 Personal history of other infectious and parasitic diseases: Secondary | ICD-10-CM | POA: Insufficient documentation

## 2015-03-25 DIAGNOSIS — R1084 Generalized abdominal pain: Secondary | ICD-10-CM

## 2015-03-25 DIAGNOSIS — Z87891 Personal history of nicotine dependence: Secondary | ICD-10-CM | POA: Insufficient documentation

## 2015-03-25 DIAGNOSIS — J449 Chronic obstructive pulmonary disease, unspecified: Secondary | ICD-10-CM | POA: Insufficient documentation

## 2015-03-25 DIAGNOSIS — Z79899 Other long term (current) drug therapy: Secondary | ICD-10-CM | POA: Insufficient documentation

## 2015-03-25 DIAGNOSIS — Z7982 Long term (current) use of aspirin: Secondary | ICD-10-CM | POA: Insufficient documentation

## 2015-03-25 DIAGNOSIS — F419 Anxiety disorder, unspecified: Secondary | ICD-10-CM | POA: Insufficient documentation

## 2015-03-25 LAB — URINALYSIS, ROUTINE W REFLEX MICROSCOPIC
BILIRUBIN URINE: NEGATIVE
GLUCOSE, UA: NEGATIVE mg/dL
Hgb urine dipstick: NEGATIVE
Ketones, ur: NEGATIVE mg/dL
Leukocytes, UA: NEGATIVE
NITRITE: NEGATIVE
PH: 6.5 (ref 5.0–8.0)
Protein, ur: NEGATIVE mg/dL
SPECIFIC GRAVITY, URINE: 1.008 (ref 1.005–1.030)
Urobilinogen, UA: 0.2 mg/dL (ref 0.0–1.0)

## 2015-03-25 LAB — CBC WITH DIFFERENTIAL/PLATELET
BASOS PCT: 0 % (ref 0–1)
Basophils Absolute: 0 10*3/uL (ref 0.0–0.1)
EOS ABS: 0.1 10*3/uL (ref 0.0–0.7)
Eosinophils Relative: 2 % (ref 0–5)
HCT: 44.7 % (ref 39.0–52.0)
HEMOGLOBIN: 15.6 g/dL (ref 13.0–17.0)
LYMPHS ABS: 2.5 10*3/uL (ref 0.7–4.0)
LYMPHS PCT: 32 % (ref 12–46)
MCH: 32 pg (ref 26.0–34.0)
MCHC: 34.9 g/dL (ref 30.0–36.0)
MCV: 91.8 fL (ref 78.0–100.0)
MONOS PCT: 11 % (ref 3–12)
Monocytes Absolute: 0.9 10*3/uL (ref 0.1–1.0)
NEUTROS PCT: 55 % (ref 43–77)
Neutro Abs: 4.2 10*3/uL (ref 1.7–7.7)
PLATELETS: 220 10*3/uL (ref 150–400)
RBC: 4.87 MIL/uL (ref 4.22–5.81)
RDW: 13.4 % (ref 11.5–15.5)
WBC: 7.7 10*3/uL (ref 4.0–10.5)

## 2015-03-25 LAB — COMPREHENSIVE METABOLIC PANEL
ALT: 23 U/L (ref 17–63)
AST: 28 U/L (ref 15–41)
Albumin: 3.9 g/dL (ref 3.5–5.0)
Alkaline Phosphatase: 52 U/L (ref 38–126)
Anion gap: 6 (ref 5–15)
BUN: 7 mg/dL (ref 6–20)
CALCIUM: 9.7 mg/dL (ref 8.9–10.3)
CHLORIDE: 104 mmol/L (ref 101–111)
CO2: 28 mmol/L (ref 22–32)
Creatinine, Ser: 0.88 mg/dL (ref 0.61–1.24)
GFR calc Af Amer: >60 mL/min (ref >60)
GFR calc non Af Amer: >60 mL/min (ref >60)
Glucose, Bld: 91 mg/dL (ref 65–99)
Potassium: 4.3 mmol/L (ref 3.5–5.1)
SODIUM: 138 mmol/L (ref 135–145)
Total Bilirubin: 0.8 mg/dL (ref 0.3–1.2)
Total Protein: 7 g/dL (ref 6.5–8.1)

## 2015-03-25 LAB — LIPASE, BLOOD: Lipase: 22 U/L (ref 22–51)

## 2015-03-25 LAB — POC OCCULT BLOOD, ED: FECAL OCCULT BLD: NEGATIVE

## 2015-03-25 MED ORDER — PROMETHAZINE HCL 25 MG PO TABS: 25.0000 mg | ORAL_TABLET | Freq: Four times a day (QID) | ORAL | Status: DC | PRN

## 2015-03-25 MED ORDER — MORPHINE SULFATE 4 MG/ML IJ SOLN
4.0000 mg | Freq: Once | INTRAMUSCULAR | Status: AC
  Administered 2015-03-25: 4 mg via INTRAVENOUS
  Filled 2015-03-25: qty 1

## 2015-03-25 MED ORDER — SODIUM CHLORIDE 0.9 % IV BOLUS (SEPSIS)
1000.0000 mL | Freq: Once | INTRAVENOUS | Status: AC
  Administered 2015-03-25: 1000 mL via INTRAVENOUS

## 2015-03-25 MED ORDER — ONDANSETRON HCL 4 MG/2ML IJ SOLN
4.0000 mg | Freq: Once | INTRAMUSCULAR | Status: AC
  Administered 2015-03-25: 4 mg via INTRAVENOUS
  Filled 2015-03-25: qty 2

## 2015-03-25 MED ORDER — HYDROCODONE-ACETAMINOPHEN 5-325 MG PO TABS: 1.0000 | ORAL_TABLET | ORAL | Status: DC | PRN

## 2015-03-25 NOTE — ED Notes (Addendum)
Pt presents to the ED with abdominal pain. Pt complaining of a knot in his right lower quadrant. Pt reporting loss of appetite, weakness, dark stools, and nausea. Pt rating pain 9/10.

## 2015-03-25 NOTE — Discharge Instructions (Signed)
Tests were all normal. Medication for pain and nausea. Follow-up your primary care doctor

## 2015-03-25 NOTE — ED Provider Notes (Signed)
CSN: [id number]     Arrival date & time 03/25/15  6568 History   First MD Initiated Contact with Patient 03/25/15 0703     Chief Complaint  Patient presents with  . Abdominal Pain     (Consider location/radiation/quality/duration/timing/severity/associated sxs/prior Treatment) HPI.... Generalized abdominal pain for several days with radiation to the right axilla. Patient was seen for similar symptoms on July 11 with a negative CT of the abdomen and pelvis. He has been eating. He claims black stools. Past history of hepatitis C, ulcer disease, COPD, anxiety. No fever, sweats, chills, dysuria, vomiting, diarrhea, chest pain, dyspnea.  Past Medical History  Diagnosis Date  . Stomach ulcer   . Hepatitis   . Hepatitis C   . COPD (chronic obstructive pulmonary disease)   . Sinus congestion   . Anxiety    Past Surgical History  Procedure Laterality Date  . Hernia repair     No family history on file. History  Substance Use Topics  . Smoking status: Former Smoker -- 0.50 packs/day for 30 years    Types: Cigarettes    Quit date: 08/04/2013  . Smokeless tobacco: Never Used  . Alcohol Use: No     Comment: QUIT    Review of Systems  All other systems reviewed and are negative.     Allergies  Review of patient's allergies indicates no known allergies.  Home Medications   Prior to Admission medications   Medication Sig Start Date End Date Taking? Authorizing Provider  albuterol (PROAIR HFA) 108 (90 BASE) MCG/ACT inhaler 2 puffs every 4 hours as needed only  if your can't catch your breath Patient taking differently: Inhale 2 puffs into the lungs every 4 (four) hours as needed for wheezing or shortness of breath.  03/16/14  Yes Tanda Rockers, MD  aspirin 81 MG tablet Take 81 mg by mouth daily.   Yes Historical Provider, MD  budesonide-formoterol (SYMBICORT) 160-4.5 MCG/ACT inhaler Inhale 2 puffs into the lungs 2 (two) times daily as needed (shortness of breath).    Yes Historical  Provider, MD  Cholecalciferol (VITAMIN D3) 1000 UNITS CAPS Take 1,000 Units by mouth daily.    Yes Historical Provider, MD  fluticasone (FLONASE) 50 MCG/ACT nasal spray Place 1 spray into both nostrils daily. 12/22/14  Yes Melony Overly, MD  amoxicillin (AMOXIL) 500 MG capsule Take 1 capsule (500 mg total) by mouth 3 (three) times daily. Patient not taking: Reported on 03/15/2015 01/28/15   Gregor Hams, MD  HYDROcodone-acetaminophen (NORCO/VICODIN) 5-325 MG per tablet Take 1-2 tablets by mouth every 4 (four) hours as needed. 03/25/15   Nat Christen, MD  predniSONE (DELTASONE) 5 MG tablet Take 6 tablets (30 mg total) by mouth daily. Patient not taking: Reported on 03/15/2015 01/28/15   Gregor Hams, MD  promethazine (PHENERGAN) 25 MG tablet Take 1 tablet (25 mg total) by mouth every 6 (six) hours as needed for nausea or vomiting. 03/25/15   Nat Christen, MD   BP 114/75 mmHg  Pulse 56  Temp(Src) 98.2 F (36.8 C) (Oral)  Resp 18  Ht 6\' 1"  (1.854 m)  Wt 159 lb (72.122 kg)  BMI 20.98 kg/m2  SpO2 97% Physical Exam  Constitutional: He is oriented to person, place, and time. He appears well-developed and well-nourished.  HENT:  Head: Normocephalic and atraumatic.  Eyes: Conjunctivae and EOM are normal. Pupils are equal, round, and reactive to light.  Neck: Normal range of motion. Neck supple.  Cardiovascular: Normal rate and regular  rhythm.   Pulmonary/Chest: Effort normal and breath sounds normal.  Abdominal: Soft. Bowel sounds are normal.  Genitourinary:  Rectal exam: No masses. Heme negative.  Musculoskeletal: Normal range of motion.  Neurological: He is alert and oriented to person, place, and time.  Skin: Skin is warm and dry.  Psychiatric: He has a normal mood and affect. His behavior is normal.  Nursing note and vitals reviewed.   ED Course  Procedures (including critical care time) Labs Review Labs Reviewed  COMPREHENSIVE METABOLIC PANEL  LIPASE, BLOOD  CBC WITH DIFFERENTIAL/PLATELET   URINALYSIS, ROUTINE W REFLEX MICROSCOPIC (NOT AT [facility])  POC OCCULT BLOOD, ED    Imaging Review No results found.   EKG Interpretation None      MDM   Final diagnoses:  Abdominal pain, generalized    Patient is in no acute distress. Screening labs were all normal. Occult blood negative. CT scan from 03/15/2015 reviewed. He has primary care follow-up. Discharge medications Norco and Phenergan 25 mg    Nat Christen, MD 03/25/15 1052

## 2015-03-26 NOTE — Addendum Note (Signed)
Addended by: Tish Begin on: 03/26/2015 11:17 AM   Modules accepted: Orders  

## 2015-03-26 NOTE — Addendum Note (Signed)
Addended by: Arta Silence on: 03/26/2015 11:16 AM   Modules accepted: Orders

## 2015-04-08 ENCOUNTER — Encounter (HOSPITAL_COMMUNITY): Payer: Self-pay | Admitting: *Deleted

## 2015-04-12 ENCOUNTER — Encounter (HOSPITAL_COMMUNITY): Payer: Self-pay | Admitting: Family Medicine

## 2015-04-12 ENCOUNTER — Emergency Department (INDEPENDENT_AMBULATORY_CARE_PROVIDER_SITE_OTHER)
Admission: EM | Admit: 2015-04-12 | Discharge: 2015-04-12 | Disposition: A | Discharge status: Home / Self Care | Payer: No Typology Code available for payment source | Source: Home / Self Care | Attending: Family Medicine | Admitting: Family Medicine

## 2015-04-12 DIAGNOSIS — J0101 Acute recurrent maxillary sinusitis: Secondary | ICD-10-CM

## 2015-04-12 DIAGNOSIS — K047 Periapical abscess without sinus: Secondary | ICD-10-CM

## 2015-04-12 DIAGNOSIS — R1031 Right lower quadrant pain: Secondary | ICD-10-CM

## 2015-04-12 MED ORDER — MAGIC MOUTHWASH: 5.0000 mL | Freq: Three times a day (TID) | ORAL | Status: DC

## 2015-04-12 MED ORDER — AMOXICILLIN-POT CLAVULANATE 875-125 MG PO TABS: 1.0000 | ORAL_TABLET | Freq: Two times a day (BID) | ORAL | Status: DC

## 2015-04-12 NOTE — Discharge Instructions (Signed)
You likely have a sinus infection as well as a dental infection. Please use the mouthwash as prescribed. Please use the antibiotics as prescribed. Please make sure to go for your colonoscopy on Thursday. Please use ibuprofen for additional pain relief. Please start using her Flonase every night.

## 2015-04-12 NOTE — ED Provider Notes (Signed)
CSN: [id number]     Arrival date & time 04/12/15  1259 History   First MD Initiated Contact with Patient 04/12/15 1316     Chief Complaint  Patient presents with  . Facial Pain   (Consider location/radiation/quality/duration/timing/severity/associated sxs/prior Treatment) HPI  Faical pain:  Started 1 day ago. Started upon awaking. Started as soreness and numbness in several sockets from missing teeth. Radiates up L cheek to eye and around the side of the head to the neck. ASAx2 w/o benefit. Using his flonase sporadically. Constant and getting worse. Chronic ongoing history of recurrent sinusitis.  Abdominal pain: Right lower quadrant, comes and goes. Associated with a bulge in that area which can go away with pressing. No history of colonoscopy. Denies any fevers, night sweats, unintentional weight loss, melena, hematochezia, loss of appetite. No family history of colon cancer. Patient state she's had extensive workup for this in the past before but has not yet had a colonoscopy. States that he is scheduled for colonoscopy this Thursday.   Past Medical History  Diagnosis Date  . Stomach ulcer   . COPD (chronic obstructive pulmonary disease)   . Sinus congestion   . Anxiety   . Hepatitis   . Hepatitis C     Treatment x 8 weeks 6-7 months ago Harvoni   Past Surgical History  Procedure Laterality Date  . Hernia repair    . Stomach surgery      20 years   Family History  Problem Relation Age of Onset  . Hypertension Mother   . Anxiety disorder Mother    History  Substance Use Topics  . Smoking status: Former Smoker -- 0.50 packs/day for 30 years    Types: Cigarettes    Quit date: 08/04/2013  . Smokeless tobacco: Never Used  . Alcohol Use: No     Comment: QUIT    Review of Systems Per HPI with all other pertinent systems negative.   Allergies  Review of patient's allergies indicates no known allergies.  Home Medications   Prior to Admission medications   Medication  Sig Start Date End Date Taking? Authorizing Provider  aspirin 81 MG tablet Take 81 mg by mouth every morning.    Yes Historical Provider, MD  budesonide-formoterol (SYMBICORT) 160-4.5 MCG/ACT inhaler Inhale 2 puffs into the lungs 2 (two) times daily as needed (shortness of breath).    Yes Historical Provider, MD  fluticasone (FLONASE) 50 MCG/ACT nasal spray Place 1 spray into both nostrils daily. Patient taking differently: Place 1 spray into both nostrils 2 (two) times daily as needed for allergies or rhinitis.  12/22/14  Yes Melony Overly, MD  promethazine (PHENERGAN) 25 MG tablet Take 1 tablet (25 mg total) by mouth every 6 (six) hours as needed for nausea or vomiting. 03/25/15  Yes Nat Christen, MD  Alum & Mag Hydroxide-Simeth (MAGIC MOUTHWASH) SOLN Take 5 mLs by mouth 3 (three) times daily. Swish and spit 04/12/15   Waldemar Dickens, MD  amoxicillin-clavulanate (AUGMENTIN) 875-125 MG per tablet Take 1 tablet by mouth 2 (two) times daily. 04/12/15   Waldemar Dickens, MD   BP 115/80 mmHg  Pulse 70  Temp(Src) 99.1 F (37.3 C) (Oral)  Resp 16  SpO2 100% Physical Exam Physical Exam  Constitutional: oriented to person, place, and time. appears well-developed and well-nourished. No distress.  HENT:  Head: Normocephalic and atraumatic.  Left maxillary sinus tender to palpation, oral cavity with numerous  missing teeth in with area of skin maceration with whitish  plaques. Easily removable. Eyes: EOMI. PERRL.  Neck: Normal range of motion.  Cardiovascular: RRR, no m/r/g, 2+ distal pulses,  Pulmonary/Chest: Effort normal and breath sounds normal. No respiratory distress.  Abdominal: Soft. Bowel sounds are normal. NonTTP, no distension.  Musculoskeletal: Normal range of motion. Non ttp, no effusion.  Neurological: alert and oriented to person, place, and time.  Skin: Skin is warm. No rash noted. non diaphoretic.  Psychiatric: normal mood and affect. behavior is normal. Judgment and thought content normal.    ED Course  Procedures (including critical care time) Labs Review Labs Reviewed - No data to display  Imaging Review No results found.   MDM   1. Acute recurrent maxillary sinusitis   2. Dental infection   3. Right lower quadrant abdominal pain    Start Augmentin for sinus infection and peritonitis. Of note plaques in mouth or either just from skin breakdown and maceration versus early fungal infection though less likely. Start Magic mouthwash swish and spit. Total exam benign and likely patient with inguinal hernia, need surgical follow-up. Of note patient is not you had a colonoscopy and agree with patient findings: Loss be this week for screening but I will still likely need to follow-up for hernia repair.    Waldemar Dickens, MD 04/12/15 [phone]

## 2015-04-12 NOTE — ED Notes (Signed)
Pt states that his gums are sore, both sinus passages, and the left side of his face and neck.  He has been experiencing this problem since last night.

## 2015-04-13 ENCOUNTER — Other Ambulatory Visit: Payer: Self-pay | Admitting: Gastroenterology

## 2015-04-14 ENCOUNTER — Encounter (HOSPITAL_COMMUNITY): Payer: Self-pay

## 2015-04-14 ENCOUNTER — Ambulatory Visit (HOSPITAL_COMMUNITY)
Admission: RE | Admit: 2015-04-14 | Discharge: 2015-04-14 | Disposition: A | Discharge status: Home / Self Care | Payer: Self-pay | Source: Ambulatory Visit | Attending: Gastroenterology | Admitting: Gastroenterology

## 2015-04-14 ENCOUNTER — Ambulatory Visit (HOSPITAL_COMMUNITY): Payer: No Typology Code available for payment source | Admitting: Anesthesiology

## 2015-04-14 ENCOUNTER — Encounter (HOSPITAL_COMMUNITY)
Admission: RE | Disposition: A | Discharge status: Home / Self Care | Payer: Self-pay | Source: Ambulatory Visit | Attending: Gastroenterology

## 2015-04-14 ENCOUNTER — Ambulatory Visit (HOSPITAL_COMMUNITY): Payer: Self-pay | Admitting: Anesthesiology

## 2015-04-14 DIAGNOSIS — Z87891 Personal history of nicotine dependence: Secondary | ICD-10-CM | POA: Insufficient documentation

## 2015-04-14 DIAGNOSIS — J449 Chronic obstructive pulmonary disease, unspecified: Secondary | ICD-10-CM | POA: Insufficient documentation

## 2015-04-14 DIAGNOSIS — B1920 Unspecified viral hepatitis C without hepatic coma: Secondary | ICD-10-CM | POA: Insufficient documentation

## 2015-04-14 DIAGNOSIS — Z7982 Long term (current) use of aspirin: Secondary | ICD-10-CM | POA: Insufficient documentation

## 2015-04-14 DIAGNOSIS — Z1211 Encounter for screening for malignant neoplasm of colon: Secondary | ICD-10-CM | POA: Insufficient documentation

## 2015-04-14 DIAGNOSIS — K279 Peptic ulcer, site unspecified, unspecified as acute or chronic, without hemorrhage or perforation: Secondary | ICD-10-CM | POA: Insufficient documentation

## 2015-04-14 DIAGNOSIS — K635 Polyp of colon: Secondary | ICD-10-CM | POA: Insufficient documentation

## 2015-04-14 HISTORY — PX: COLONOSCOPY WITH PROPOFOL: SHX5780

## 2015-04-14 SURGERY — COLONOSCOPY WITH PROPOFOL
Anesthesia: Monitor Anesthesia Care

## 2015-04-14 MED ORDER — SODIUM CHLORIDE 0.9 % IV SOLN
INTRAVENOUS | Status: DC
Start: 2015-04-14 — End: 2015-04-14

## 2015-04-14 MED ORDER — PROPOFOL 10 MG/ML IV BOLUS
INTRAVENOUS | Status: AC
  Filled 2015-04-14: qty 40

## 2015-04-14 MED ORDER — PROPOFOL INFUSION 10 MG/ML OPTIME
INTRAVENOUS | Status: DC | PRN
  Administered 2015-04-14: 80 ug/kg/min via INTRAVENOUS

## 2015-04-14 MED ORDER — LACTATED RINGERS IV SOLN
INTRAVENOUS | Status: DC
  Administered 2015-04-14: 09:00:00 via INTRAVENOUS

## 2015-04-14 MED ORDER — PROPOFOL 10 MG/ML IV BOLUS
INTRAVENOUS | Status: DC | PRN
  Administered 2015-04-14 (×5): 20 mg via INTRAVENOUS

## 2015-04-14 SURGICAL SUPPLY — 21 items

## 2015-04-14 NOTE — Discharge Instructions (Signed)
Colonoscopy ° °Post procedure instructions: ° °Read the instructions outlined below and refer to this sheet in the next few weeks. These discharge instructions provide you with general information on caring for yourself after you leave the hospital. Your doctor may also give you specific instructions. While your treatment has been planned according to the most current medical practices available, unavoidable complications occasionally occur. If you have any problems or questions after discharge, call Dr. Tyrome Donatelli at [facility] ([phone]). ° °HOME CARE INSTRUCTIONS ° °ACTIVITY: °· You may resume your regular activity, but move at a slower pace for the next 24 hours.  °· Take frequent rest periods for the next 24 hours.  °· Walking will help get rid of the air and reduce the bloated feeling in your belly (abdomen).  °· No driving for 24 hours (because of the medicine (anesthesia) used during the test).  °· You may shower.  °· Do not sign any important legal documents or operate any machinery for 24 hours (because of the anesthesia used during the test).  °NUTRITION: °· Drink plenty of fluids.  °· You may resume your normal diet as instructed by your doctor.  °· Begin with a light meal and progress to your normal diet. Heavy or fried foods are harder to digest and may make you feel sick to your stomach (nauseated).  °· Avoid alcoholic beverages for 24 hours or as instructed.  °MEDICATIONS: °· You may resume your normal medications unless your doctor tells you otherwise.  °WHAT TO EXPECT TODAY: °· Some feelings of bloating in the abdomen.  °· Passage of more gas than usual.  °· Spotting of blood in your stool or on the toilet paper.  °IF YOU HAD POLYPS REMOVED DURING THE COLONOSCOPY: °· No aspirin products for 7 days or as instructed.  °· No alcohol for 7 days or as instructed.  °· Eat a soft diet for the next 24 hours.  ° °FINDING OUT THE RESULTS OF YOUR TEST ° °Not all test results are available during your  visit. If your test results are not back during the visit, make an appointment with your caregiver to find out the results. Do not assume everything is normal if you have not heard from your caregiver or the medical facility. It is important for you to follow up on all of your test results.  ° ° ° °SEEK IMMEDIATE MEDICAL CARE IF: ° °· You have more than a spotting of blood in your stool.  °· Your belly is swollen (abdominal distention).  °· You are nauseated or vomiting.  °· You have a fever.  °· You have abdominal pain or discomfort that is severe or gets worse throughout the day.  ° ° °Document Released: 04/04/2004 Document Revised: 05/03/2011 Document Reviewed: 04/02/2008 °ExitCare® Patient Information ©2012 [employer]. ° °

## 2015-04-14 NOTE — Transfer of Care (Signed)
Immediate Anesthesia Transfer of Care Note  Patient: [name] [name]  Procedure(s) Performed: Procedure(s): COLONOSCOPY WITH PROPOFOL (N/A)  Patient Location: PACU  Anesthesia Type:MAC  Level of Consciousness:  sedated, patient cooperative and responds to stimulation  Airway & Oxygen Therapy:Patient Spontanous Breathing and Patient connected to face mask oxgen  Post-op Assessment:  Report given to PACU RN and Post -op Vital signs reviewed and stable  Post vital signs:  Reviewed and stable  Last Vitals:  Filed Vitals:   04/14/15 0848  BP: 134/85  Pulse: 60  Temp: 36.6 C  Resp: 10    Complications: No apparent anesthesia complications

## 2015-04-14 NOTE — Anesthesia Preprocedure Evaluation (Signed)
Anesthesia Evaluation  Patient identified by MRN, date of birth, ID band Patient awake    Reviewed: Allergy & Precautions, H&P , NPO status , Patient's Chart, lab work & pertinent test results  Airway Mallampati: II   Neck ROM: full    Dental   Pulmonary shortness of breath, COPDformer smoker,  breath sounds clear to auscultation        Cardiovascular negative cardio ROS  Rhythm:regular Rate:Normal     Neuro/Psych    GI/Hepatic PUD, (+) Hepatitis -, C  Endo/Other    Renal/GU      Musculoskeletal   Abdominal   Peds  Hematology   Anesthesia Other Findings   Reproductive/Obstetrics                             Anesthesia Physical Anesthesia Plan  ASA: II  Anesthesia Plan: MAC   Post-op Pain Management:    Induction: Intravenous  Airway Management Planned: Nasal Cannula  Additional Equipment:   Intra-op Plan:   Post-operative Plan:   Informed Consent: I have reviewed the patients History and Physical, chart, labs and discussed the procedure including the risks, benefits and alternatives for the proposed anesthesia with the patient or authorized representative who has indicated his/her understanding and acceptance.     Plan Discussed with: CRNA, Anesthesiologist and Surgeon  Anesthesia Plan Comments:         Anesthesia Quick Evaluation

## 2015-04-14 NOTE — H&P (Signed)
Patient interval history reviewed.  Patient examined again.  There has been no change from documented H/P dated 04/14/15 (scanned into chart from our office) except as documented above.  Assessment:  1.  Average risk colon cancer screening.  Plan:  1.  Colonoscopy. 2.  Risks (bleeding, infection, bowel perforation that could require surgery, sedation-related changes in cardiopulmonary systems), benefits (identification and possible treatment of source of symptoms, exclusion of certain causes of symptoms), and alternatives (watchful waiting, radiographic imaging studies, empiric medical treatment) of colonoscopy were explained to patient/family in detail and patient wishes to proceed.

## 2015-04-14 NOTE — Anesthesia Postprocedure Evaluation (Signed)
Anesthesia Post Note  Patient: [name] [name]  Procedure(s) Performed: Procedure(s) (LRB): COLONOSCOPY WITH PROPOFOL (N/A)  Anesthesia type: MAC  Patient location: PACU  Post pain: Pain level controlled and Adequate analgesia  Post assessment: Post-op Vital signs reviewed, Patient's Cardiovascular Status Stable and Respiratory Function Stable  Last Vitals:  Filed Vitals:   04/14/15 1040  BP: 132/78  Pulse: 45  Temp:   Resp: 12    Post vital signs: Reviewed and stable  Level of consciousness: awake, alert  and oriented  Complications: No apparent anesthesia complications

## 2015-04-14 NOTE — Op Note (Signed)
[facility] [place], [address]   COLONOSCOPY PROCEDURE REPORT  PATIENT: [name] [name], [name] [name]  MR#: [id number] BIRTHDATE: 12-Mar-1956 , 89  yrs. old GENDER: male ENDOSCOPIST: Arta Silence, MD REFERRED BB:[id number] Marlou Sa, M.D. PROCEDURE DATE:  May 08, 2015 PROCEDURE:   Colonoscopy with cold biopsy polypectomy ASA CLASS:   Class II INDICATIONS:average risk patient for colon cancer; no prior colonoscopy MEDICATIONS: Monitored anesthesia care  DESCRIPTION OF PROCEDURE:   After the risks benefits and alternatives of the procedure were thoroughly explained, informed consent was obtained.  revealed no abnormalities of the rectum. The EC-[id number] ([id number])  endoscope was introduced through the anus and advanced to the cecum, which was identified by both the appendix and ileocecal valve. No adverse events experienced.   The quality of the prep was good.  The instrument was then slowly withdrawn as the colon was fully examined. Estimated blood loss is zero unless otherwise noted in this procedure report.    Findings:  Normal digital rectal exam.  Prep quality was good.  Two small hyperplastic-appearing sigmoid colon polyps, removed with cold biopsy forceps.  No other polyps, masses, vascular ectasias, or inflammatory changes were identified.  Retroflexed view of rectum was normal.             Withdrawal time was 16 minutes     . The scope was withdrawn and the procedure completed.  COMPLICATIONS:  ENDOSCOPIC IMPRESSION:     As above.  Two small polyps removed. Otherwise normal colonoscopy to the cecum.  RECOMMENDATIONS:     1.  Watch for potential complications of procedure. 2.  Await biopsy results. 3.  Repeat colonoscopy in 5-10 years, pending pathology results. 4.  Follow-up with [facility] on as-needed basis.  eSigned:  Arta Silence, MD 05/08/2015 10:12 AM   cc:  CPT CODES: ICD CODES:  The ICD and CPT codes recommended by this software  are interpretations from the data that the clinical staff has captured with the software.  The verification of the translation of this report to the ICD and CPT codes and modifiers is the sole responsibility of the health care institution and practicing physician where this report was generated.  [employer] will not be held responsible for the validity of the ICD and CPT codes included on this report.  AMA assumes no liability for data contained or not contained herein. CPT is a Designer, television/film set of the [employer].

## 2015-04-15 ENCOUNTER — Encounter (HOSPITAL_COMMUNITY): Payer: Self-pay | Admitting: Gastroenterology

## 2015-04-15 ENCOUNTER — Emergency Department (HOSPITAL_COMMUNITY)
Admission: EM | Admit: 2015-04-15 | Discharge: 2015-04-15 | Disposition: A | Discharge status: Home / Self Care | Payer: No Typology Code available for payment source | Attending: Emergency Medicine | Admitting: Emergency Medicine

## 2015-04-15 DIAGNOSIS — J449 Chronic obstructive pulmonary disease, unspecified: Secondary | ICD-10-CM | POA: Insufficient documentation

## 2015-04-15 DIAGNOSIS — Z8619 Personal history of other infectious and parasitic diseases: Secondary | ICD-10-CM | POA: Insufficient documentation

## 2015-04-15 DIAGNOSIS — Z8659 Personal history of other mental and behavioral disorders: Secondary | ICD-10-CM | POA: Insufficient documentation

## 2015-04-15 DIAGNOSIS — Z792 Long term (current) use of antibiotics: Secondary | ICD-10-CM | POA: Insufficient documentation

## 2015-04-15 DIAGNOSIS — R040 Epistaxis: Secondary | ICD-10-CM

## 2015-04-15 DIAGNOSIS — Z87891 Personal history of nicotine dependence: Secondary | ICD-10-CM | POA: Insufficient documentation

## 2015-04-15 DIAGNOSIS — Z7982 Long term (current) use of aspirin: Secondary | ICD-10-CM | POA: Insufficient documentation

## 2015-04-15 DIAGNOSIS — Z7951 Long term (current) use of inhaled steroids: Secondary | ICD-10-CM | POA: Insufficient documentation

## 2015-04-15 MED ORDER — OXYMETAZOLINE HCL 0.05 % NA SOLN: 1.0000 | Freq: Three times a day (TID) | NASAL | Status: DC | PRN

## 2015-04-15 NOTE — ED Notes (Signed)
Pt here with c/o having blood when he blew his nose this am.  Pt was Dx with sinus infection and currently on flonase and augmentin for same.  Pt has colonoscopy procedure yesterday without complication.  Pt advises no blood currently from his nose.

## 2015-04-15 NOTE — ED Notes (Signed)
Pt c/o hemoptysis starting this morning.  Denies pain.  Pt reports chronic sinus issues and sts "when I blew my nose this morning there was a little blood and then I spit some blood too."  Sts blood was bright red.  Pt is concerned because he had a colonoscopy yesterday.  Denies blood in stool.

## 2015-04-15 NOTE — ED Provider Notes (Signed)
CSN: [id number]     Arrival date & time 04/15/15  1413 History   First MD Initiated Contact with Patient 04/15/15 1451     Chief Complaint  Patient presents with  . Hemoptysis     (Consider location/radiation/quality/duration/timing/severity/associated sxs/prior Treatment) Patient is a 59 y.o. male presenting with nosebleeds.  Epistaxis Location:  Unable to specify Severity:  Mild Duration:  75 minutes Timing:  Constant Progression:  Resolved Chronicity:  Recurrent Context: aspirin use   Context: not anticoagulants and not bleeding disorder   Relieved by:  None tried Worsened by:  Nothing tried Ineffective treatments:  None tried Associated symptoms: sinus pain   Associated symptoms: no blood in oropharynx, no cough, no fever, no headaches, no sneezing and no sore throat   Risk factors: intranasal steroids and sinus problems   Risk factors: no change in medication     Past Medical History  Diagnosis Date  . Stomach ulcer   . COPD (chronic obstructive pulmonary disease)   . Sinus congestion   . Anxiety   . Hepatitis   . Hepatitis C     Treatment x 8 weeks 6-7 months ago Harvoni   Past Surgical History  Procedure Laterality Date  . Hernia repair    . Stomach surgery      20 years  . Colonoscopy with propofol N/A 04/14/2015    Procedure: COLONOSCOPY WITH PROPOFOL;  Surgeon: Arta Silence, MD;  Location: [facility];  Service: Endoscopy;  Laterality: N/A;   Family History  Problem Relation Age of Onset  . Hypertension Mother   . Anxiety disorder Mother    Social History  Substance Use Topics  . Smoking status: Former Smoker -- 0.50 packs/day for 30 years    Types: Cigarettes    Quit date: 08/04/2013  . Smokeless tobacco: Never Used  . Alcohol Use: No     Comment: QUIT    Review of Systems  Constitutional: Negative for fever.  HENT: Positive for nosebleeds. Negative for sneezing and sore throat.   Respiratory: Negative for cough.   Neurological: Negative  for headaches.      Allergies  Review of patient's allergies indicates no known allergies.  Home Medications   Prior to Admission medications   Medication Sig Start Date End Date Taking? Authorizing Provider  amoxicillin-clavulanate (AUGMENTIN) 875-125 MG per tablet Take 1 tablet by mouth 2 (two) times daily. 04/12/15  Yes Waldemar Dickens, MD  aspirin 81 MG tablet Take 81 mg by mouth every morning.    Yes Historical Provider, MD  budesonide-formoterol (SYMBICORT) 160-4.5 MCG/ACT inhaler Inhale 2 puffs into the lungs 2 (two) times daily as needed (shortness of breath).    Yes Historical Provider, MD  Ferrous Sulfate (IRON) 28 MG TABS Take 28 mg by mouth daily.    Yes Historical Provider, MD  fluticasone (FLONASE) 50 MCG/ACT nasal spray Place 1 spray into both nostrils daily. Patient taking differently: Place 1 spray into both nostrils 2 (two) times daily as needed for allergies or rhinitis.  12/22/14  Yes Melony Overly, MD   BP 119/81 mmHg  Pulse 82  Temp(Src) 98.4 F (36.9 C) (Oral)  Resp 18  SpO2 98% Physical Exam  Constitutional: He appears well-developed and well-nourished.  HENT:  Head: Normocephalic and atraumatic.  Nose: No mucosal edema, rhinorrhea, nose lacerations, sinus tenderness or nasal deformity. No epistaxis.  No foreign bodies. Right sinus exhibits no maxillary sinus tenderness and no frontal sinus tenderness. Left sinus exhibits maxillary sinus tenderness. Left sinus  exhibits no frontal sinus tenderness.  Mouth/Throat: Mucous membranes are not pale and not dry. No oropharyngeal exudate, posterior oropharyngeal edema or posterior oropharyngeal erythema.  Cardiovascular: Normal rate and regular rhythm.   Pulmonary/Chest: Effort normal. No respiratory distress.  Abdominal: He exhibits no distension. There is no tenderness.  Skin: Skin is warm and dry. No erythema. No pallor.  Nursing note and vitals reviewed.   ED Course  Procedures (including critical care  time) Labs Review Labs Reviewed - No data to display  Imaging Review No results found.   EKG Interpretation None      MDM   Final diagnoses:  Bleeding nose   59 year old male had a nosebleed for approximately 60-90 minutes earlier that was drained on the back of his throat causing him to cough. He had a colonoscopy recently so was concerned and came here for evaluation. Not on Coumadin but does take baby aspirin daily. Has a history of sinus issues for which he is on antibiotics and has also been taking intranasal fluticasone. On exam patient is not tachycardic no pill scan neuro exam is normal without any gait or rally's or ataxia. Doubt significant blood loss. There is been hemostatic for approximately 1 hour or so likely to be hemostatic. Again secondary to the intranasal steroids and his sinus infection. Gave him instructions on how to stop nosebleeds in the future and rx for afrin.   I have personally and contemperaneously reviewed labs and imaging and used in my decision making as above.   A medical screening exam was performed and I feel the patient has had an appropriate workup for their chief complaint at this time and likelihood of emergent condition existing is low. They have been counseled on decision, discharge, follow up and which symptoms necessitate immediate return to the emergency department. They or their family verbally stated understanding and agreement with plan and discharged in stable condition.       Merrily Pew, MD 04/15/15 [phone]

## 2015-05-20 ENCOUNTER — Emergency Department (HOSPITAL_COMMUNITY)
Admission: EM | Admit: 2015-05-20 | Discharge: 2015-05-20 | Disposition: A | Discharge status: Home / Self Care | Payer: Self-pay | Attending: Emergency Medicine | Admitting: Emergency Medicine

## 2015-05-20 ENCOUNTER — Encounter (HOSPITAL_COMMUNITY): Payer: Self-pay | Admitting: Emergency Medicine

## 2015-05-20 DIAGNOSIS — Z79899 Other long term (current) drug therapy: Secondary | ICD-10-CM | POA: Insufficient documentation

## 2015-05-20 DIAGNOSIS — Z8619 Personal history of other infectious and parasitic diseases: Secondary | ICD-10-CM | POA: Insufficient documentation

## 2015-05-20 DIAGNOSIS — Z8659 Personal history of other mental and behavioral disorders: Secondary | ICD-10-CM | POA: Insufficient documentation

## 2015-05-20 DIAGNOSIS — J449 Chronic obstructive pulmonary disease, unspecified: Secondary | ICD-10-CM | POA: Insufficient documentation

## 2015-05-20 DIAGNOSIS — G629 Polyneuropathy, unspecified: Secondary | ICD-10-CM | POA: Insufficient documentation

## 2015-05-20 DIAGNOSIS — Z8719 Personal history of other diseases of the digestive system: Secondary | ICD-10-CM | POA: Insufficient documentation

## 2015-05-20 DIAGNOSIS — Z7982 Long term (current) use of aspirin: Secondary | ICD-10-CM | POA: Insufficient documentation

## 2015-05-20 DIAGNOSIS — Z7951 Long term (current) use of inhaled steroids: Secondary | ICD-10-CM | POA: Insufficient documentation

## 2015-05-20 DIAGNOSIS — Z87891 Personal history of nicotine dependence: Secondary | ICD-10-CM | POA: Insufficient documentation

## 2015-05-20 LAB — COMPREHENSIVE METABOLIC PANEL
ALT: 20 U/L (ref 17–63)
AST: 29 U/L (ref 15–41)
Albumin: 4.2 g/dL (ref 3.5–5.0)
Alkaline Phosphatase: 51 U/L (ref 38–126)
Anion gap: 8 (ref 5–15)
BUN: 7 mg/dL (ref 6–20)
CHLORIDE: 106 mmol/L (ref 101–111)
CO2: 24 mmol/L (ref 22–32)
CREATININE: 0.82 mg/dL (ref 0.61–1.24)
Calcium: 9.2 mg/dL (ref 8.9–10.3)
GFR calc non Af Amer: >60 mL/min (ref >60)
Glucose, Bld: 93 mg/dL (ref 65–99)
POTASSIUM: 3.9 mmol/L (ref 3.5–5.1)
SODIUM: 138 mmol/L (ref 135–145)
Total Bilirubin: 1 mg/dL (ref 0.3–1.2)
Total Protein: 6.9 g/dL (ref 6.5–8.1)

## 2015-05-20 LAB — CBC WITH DIFFERENTIAL/PLATELET
Basophils Absolute: 0 10*3/uL (ref 0.0–0.1)
Basophils Relative: 0 %
EOS ABS: 0.1 10*3/uL (ref 0.0–0.7)
Eosinophils Relative: 1 %
HEMATOCRIT: 42.5 % (ref 39.0–52.0)
HEMOGLOBIN: 15.3 g/dL (ref 13.0–17.0)
LYMPHS PCT: 36 %
Lymphs Abs: 2.6 10*3/uL (ref 0.7–4.0)
MCH: 32.9 pg (ref 26.0–34.0)
MCHC: 36 g/dL (ref 30.0–36.0)
MCV: 91.4 fL (ref 78.0–100.0)
MONOS PCT: 9 %
Monocytes Absolute: 0.7 10*3/uL (ref 0.1–1.0)
Neutro Abs: 3.8 10*3/uL (ref 1.7–7.7)
Neutrophils Relative %: 54 %
Platelets: 214 10*3/uL (ref 150–400)
RBC: 4.65 MIL/uL (ref 4.22–5.81)
RDW: 13.7 % (ref 11.5–15.5)
WBC: 7.2 10*3/uL (ref 4.0–10.5)

## 2015-05-20 MED ORDER — GABAPENTIN 100 MG PO CAPS: 100.0000 mg | ORAL_CAPSULE | Freq: Every day | ORAL | Status: DC

## 2015-05-20 NOTE — ED Notes (Signed)
Pt reports bilateral leg pain for the last month. Pt reports bilateral leg, burning, numbness and itching. Pt ambulatory, VSS. Distal circulation intact.

## 2015-05-20 NOTE — ED Provider Notes (Signed)
CSN: [id number]     Arrival date & time 05/20/15  1106 History   First MD Initiated Contact with Patient 05/20/15 1139     Chief Complaint  Patient presents with  . Leg Pain     (Consider location/radiation/quality/duration/timing/severity/associated sxs/prior Treatment) The history is provided by the patient.    Pt with hx Hep C, COPD, former alcohol and drug abuse p/w itching, burning, sharp pain in his bilateral legs for the past month. Pain is worse at night and better with walking.  Has tried rubbing alcohol, epsom salt soaks, vaseline without improvement.   Denies injury.  Denies fevers, chills, CP, SOB, change in sputum, abdominal pain, vomiting, diarrhea.  Denies leg swelling, back pain.  Denies weakness or numbness of the legs.    Past Medical History  Diagnosis Date  . Stomach ulcer   . COPD (chronic obstructive pulmonary disease)   . Sinus congestion   . Anxiety   . Hepatitis   . Hepatitis C     Treatment x 8 weeks 6-7 months ago Harvoni   Past Surgical History  Procedure Laterality Date  . Hernia repair    . Stomach surgery      20 years  . Colonoscopy with propofol N/A 04/14/2015    Procedure: COLONOSCOPY WITH PROPOFOL;  Surgeon: Arta Silence, MD;  Location: [facility];  Service: Endoscopy;  Laterality: N/A;   Family History  Problem Relation Age of Onset  . Hypertension Mother   . Anxiety disorder Mother    Social History  Substance Use Topics  . Smoking status: Former Smoker -- 0.50 packs/day for 30 years    Types: Cigarettes    Quit date: 08/04/2013  . Smokeless tobacco: Never Used  . Alcohol Use: No     Comment: QUIT    Review of Systems  All other systems reviewed and are negative.     Allergies  Review of patient's allergies indicates no known allergies.  Home Medications   Prior to Admission medications   Medication Sig Start Date End Date Taking? Authorizing Provider  amoxicillin-clavulanate (AUGMENTIN) 875-125 MG per tablet Take 1  tablet by mouth 2 (two) times daily. 04/12/15   Waldemar Dickens, MD  aspirin 81 MG tablet Take 81 mg by mouth every morning.     Historical Provider, MD  budesonide-formoterol (SYMBICORT) 160-4.5 MCG/ACT inhaler Inhale 2 puffs into the lungs 2 (two) times daily as needed (shortness of breath).     Historical Provider, MD  Ferrous Sulfate (IRON) 28 MG TABS Take 28 mg by mouth daily.     Historical Provider, MD  fluticasone (FLONASE) 50 MCG/ACT nasal spray Place 1 spray into both nostrils daily. Patient taking differently: Place 1 spray into both nostrils 2 (two) times daily as needed for allergies or rhinitis.  12/22/14   Melony Overly, MD  oxymetazoline (AFRIN NASAL SPRAY) 0.05 % nasal spray Place 1 spray into both nostrils 3 (three) times daily as needed for congestion. 04/15/15   Merrily Pew, MD   BP 119/84 mmHg  Pulse 73  Temp(Src) 98.9 F (37.2 C) (Oral)  Resp 14  Ht 6\' 1"  (1.854 m)  Wt 158 lb (71.668 kg)  BMI 20.85 kg/m2  SpO2 99% Physical Exam  Constitutional: He appears well-developed and well-nourished. No distress.  HENT:  Head: Normocephalic and atraumatic.  Neck: Neck supple.  Cardiovascular: Normal rate, regular rhythm and intact distal pulses.   Pulmonary/Chest: Effort normal and breath sounds normal. No respiratory distress. He has no wheezes.  He has no rales.  Abdominal: Soft. He exhibits no distension and no mass. There is no tenderness. There is no rebound and no guarding.  Musculoskeletal: Normal range of motion. He exhibits no edema or tenderness.  Lower extremities:  Strength 5/5, sensation intact, distal pulses intact.  No erythema, edema, warmth, or tenderness  Neurological: He is alert. He exhibits normal muscle tone.  Skin: No rash noted. He is not diaphoretic. No erythema.  Psychiatric: He has a normal mood and affect. His behavior is normal.  Nursing note and vitals reviewed.   ED Course  Procedures (including critical care time) Labs Review Labs Reviewed   COMPREHENSIVE METABOLIC PANEL  CBC WITH DIFFERENTIAL/PLATELET    Imaging Review No results found. I have personally reviewed and evaluated these images and lab results as part of my medical decision-making.   EKG Interpretation None      MDM   Final diagnoses:  Peripheral neuropathy    Afebrile, nontoxic patient with burning, sharp, itching pain of bilateral legs, also starting in his arms.  He is not diabetic.  Denies back or neck pain. No recent illness or fever.  He is a former alcohol and polysubstance abuser and current cigarette smoker.  Has stong pulses in feet. Doubt PAD.  Possible PVD vs neuropathy.  Labs unremarkable.  D/C home with PCP follow up, neurontin qHS, return precautions. Discussed result, findings, treatment, and follow up  with patient.  Pt given return precautions.  Pt verbalizes understanding and agrees with plan.       Clayton Bibles, PA-C 05/20/15 1530  Evelina Bucy, MD 05/21/15 [phone]

## 2015-05-20 NOTE — Discharge Instructions (Signed)
Read the information below.  Use the prescribed medication as directed.  Please discuss all new medications with your pharmacist.  You may return to the Emergency Department at any time for worsening condition or any new symptoms that concern you.  If you develop uncontrolled pain, weakness or numbness of the extremity, severe discoloration of the skin, or you are unable to walk, return to the ER for a recheck.      Peripheral Neuropathy Peripheral neuropathy is a type of nerve damage. It affects nerves that carry signals between the spinal cord and other parts of the body. These are called peripheral nerves. With peripheral neuropathy, one nerve or a group of nerves may be damaged.  CAUSES  Many things can damage peripheral nerves. For some people with peripheral neuropathy, the cause is unknown. Some causes include:  Diabetes. This is the most common cause of peripheral neuropathy.  Injury to a nerve.  Pressure or stress on a nerve that lasts a long time.  Too little vitamin B. Alcoholism can lead to this.  Infections.  Autoimmune diseases, such as multiple sclerosis and systemic lupus erythematosus.  Inherited nerve diseases.  Some medicines, such as cancer drugs.  Toxic substances, such as lead and mercury.  Too little blood flowing to the legs.  Kidney disease.  Thyroid disease. SIGNS AND SYMPTOMS  Different people have different symptoms. The symptoms you have will depend on which of your nerves is damaged. Common symptoms include:  Loss of feeling (numbness) in the feet and hands.  Tingling in the feet and hands.  Pain that burns.  Very sensitive skin.  Weakness.  Not being able to move a part of the body (paralysis).  Muscle twitching.  Clumsiness or poor coordination.  Loss of balance.  Not being able to control your bladder.  Feeling dizzy.  Sexual problems. DIAGNOSIS  Peripheral neuropathy is a symptom, not a disease. Finding the cause of  peripheral neuropathy can be hard. To figure that out, your health care provider will take a medical history and do a physical exam. A neurological exam will also be done. This involves checking things affected by your brain, spinal cord, and nerves (nervous system). For example, your health care provider will check your reflexes, how you move, and what you can feel.  Other types of tests may also be ordered, such as:  Blood tests.  A test of the fluid in your spinal cord.  Imaging tests, such as CT scans or an MRI.  Electromyography (EMG). This test checks the nerves that control muscles.  Nerve conduction velocity tests. These tests check how fast messages pass through your nerves.  Nerve biopsy. A small piece of nerve is removed. It is then checked under a microscope. TREATMENT   Medicine is often used to treat peripheral neuropathy. Medicines may include:  Pain-relieving medicines. Prescription or over-the-counter medicine may be suggested.  Antiseizure medicine. This may be used for pain.  Antidepressants. These also may help ease pain from neuropathy.  Lidocaine. This is a numbing medicine. You might wear a patch or be given a shot.  Mexiletine. This medicine is typically used to help control irregular heart rhythms.  Surgery. Surgery may be needed to relieve pressure on a nerve or to destroy a nerve that is causing pain.  Physical therapy to help movement.  Assistive devices to help movement. HOME CARE INSTRUCTIONS   Only take over-the-counter or prescription medicines as directed by your health care provider. Follow the instructions carefully for any  given medicines. Do not take any other medicines without first getting approval from your health care provider.  If you have diabetes, work closely with your health care provider to keep your blood sugar under control.  If you have numbness in your feet:  Check every day for signs of injury or infection. Watch for  redness, warmth, and swelling.  Wear padded socks and comfortable shoes. These help protect your feet.  Do not do things that put pressure on your damaged nerve.  Do not smoke. Smoking keeps blood from getting to damaged nerves.  Avoid or limit alcohol. Too much alcohol can cause a lack of B vitamins. These vitamins are needed for healthy nerves.  Develop a good support system. Coping with peripheral neuropathy can be stressful. Talk to a mental health specialist or join a support group if you are struggling.  Follow up with your health care provider as directed. SEEK MEDICAL CARE IF:   You have new signs or symptoms of peripheral neuropathy.  You are struggling emotionally from dealing with peripheral neuropathy.  You have a fever. SEEK IMMEDIATE MEDICAL CARE IF:   You have an injury or infection that is not healing.  You feel very dizzy or begin vomiting.  You have chest pain.  You have trouble breathing. Document Released: 08/11/2002 Document Revised: 05/03/2011 Document Reviewed: 04/28/2013 [facility] Patient Information 2015 [place], [place]. This information is not intended to replace advice given to you by your health care provider. Make sure you discuss any questions you have with your health care provider.

## 2015-05-27 ENCOUNTER — Emergency Department (HOSPITAL_COMMUNITY): Payer: No Typology Code available for payment source

## 2015-05-27 ENCOUNTER — Emergency Department (HOSPITAL_COMMUNITY)
Admission: EM | Admit: 2015-05-27 | Discharge: 2015-05-28 | Disposition: A | Discharge status: Home / Self Care | Payer: No Typology Code available for payment source | Attending: Emergency Medicine | Admitting: Emergency Medicine

## 2015-05-27 ENCOUNTER — Encounter (HOSPITAL_COMMUNITY): Payer: Self-pay | Admitting: *Deleted

## 2015-05-27 ENCOUNTER — Emergency Department (INDEPENDENT_AMBULATORY_CARE_PROVIDER_SITE_OTHER)
Admission: EM | Admit: 2015-05-27 | Discharge: 2015-05-27 | Disposition: A | Discharge status: Nursing Home (Includes ICF) | Payer: No Typology Code available for payment source | Source: Home / Self Care | Attending: Family Medicine | Admitting: Family Medicine

## 2015-05-27 ENCOUNTER — Encounter (HOSPITAL_COMMUNITY): Payer: Self-pay | Admitting: Emergency Medicine

## 2015-05-27 DIAGNOSIS — J449 Chronic obstructive pulmonary disease, unspecified: Secondary | ICD-10-CM | POA: Insufficient documentation

## 2015-05-27 DIAGNOSIS — Z79899 Other long term (current) drug therapy: Secondary | ICD-10-CM | POA: Insufficient documentation

## 2015-05-27 DIAGNOSIS — Z8659 Personal history of other mental and behavioral disorders: Secondary | ICD-10-CM | POA: Insufficient documentation

## 2015-05-27 DIAGNOSIS — R200 Anesthesia of skin: Secondary | ICD-10-CM | POA: Insufficient documentation

## 2015-05-27 DIAGNOSIS — Z87891 Personal history of nicotine dependence: Secondary | ICD-10-CM | POA: Insufficient documentation

## 2015-05-27 DIAGNOSIS — IMO0001 Reserved for inherently not codable concepts without codable children: Secondary | ICD-10-CM

## 2015-05-27 DIAGNOSIS — R209 Unspecified disturbances of skin sensation: Secondary | ICD-10-CM

## 2015-05-27 DIAGNOSIS — Z7982 Long term (current) use of aspirin: Secondary | ICD-10-CM | POA: Insufficient documentation

## 2015-05-27 DIAGNOSIS — Z8719 Personal history of other diseases of the digestive system: Secondary | ICD-10-CM | POA: Insufficient documentation

## 2015-05-27 DIAGNOSIS — Z8619 Personal history of other infectious and parasitic diseases: Secondary | ICD-10-CM | POA: Insufficient documentation

## 2015-05-27 LAB — DIFFERENTIAL
Basophils Absolute: 0 10*3/uL (ref 0.0–0.1)
Basophils Relative: 0 %
EOS ABS: 0.2 10*3/uL (ref 0.0–0.7)
EOS PCT: 2 %
LYMPHS ABS: 3.5 10*3/uL (ref 0.7–4.0)
Lymphocytes Relative: 42 %
Monocytes Absolute: 0.7 10*3/uL (ref 0.1–1.0)
Monocytes Relative: 8 %
NEUTROS PCT: 48 %
Neutro Abs: 4.1 10*3/uL (ref 1.7–7.7)

## 2015-05-27 LAB — COMPREHENSIVE METABOLIC PANEL
ALBUMIN: 4.4 g/dL (ref 3.5–5.0)
ALT: 26 U/L (ref 17–63)
ANION GAP: 11 (ref 5–15)
AST: 35 U/L (ref 15–41)
Alkaline Phosphatase: 51 U/L (ref 38–126)
BILIRUBIN TOTAL: 1 mg/dL (ref 0.3–1.2)
BUN: 9 mg/dL (ref 6–20)
CO2: 25 mmol/L (ref 22–32)
Calcium: 9.4 mg/dL (ref 8.9–10.3)
Chloride: 102 mmol/L (ref 101–111)
Creatinine, Ser: 0.82 mg/dL (ref 0.61–1.24)
GFR calc Af Amer: >60 mL/min (ref >60)
GLUCOSE: 71 mg/dL (ref 65–99)
POTASSIUM: 4.9 mmol/L (ref 3.5–5.1)
Sodium: 138 mmol/L (ref 135–145)
TOTAL PROTEIN: 7.7 g/dL (ref 6.5–8.1)

## 2015-05-27 LAB — I-STAT CHEM 8, ED
BUN: 10 mg/dL (ref 6–20)
CALCIUM ION: 1.2 mmol/L (ref 1.12–1.23)
CREATININE: 0.8 mg/dL (ref 0.61–1.24)
Chloride: 102 mmol/L (ref 101–111)
GLUCOSE: 70 mg/dL (ref 65–99)
HCT: 48 % (ref 39.0–52.0)
HEMOGLOBIN: 16.3 g/dL (ref 13.0–17.0)
Potassium: 4.7 mmol/L (ref 3.5–5.1)
Sodium: 142 mmol/L (ref 135–145)
TCO2: 26 mmol/L (ref 0–100)

## 2015-05-27 LAB — I-STAT TROPONIN, ED: TROPONIN I, POC: 0 ng/mL (ref 0.00–0.08)

## 2015-05-27 LAB — CBC
HCT: 44.4 % (ref 39.0–52.0)
HEMOGLOBIN: 15 g/dL (ref 13.0–17.0)
MCH: 31.6 pg (ref 26.0–34.0)
MCHC: 33.8 g/dL (ref 30.0–36.0)
MCV: 93.7 fL (ref 78.0–100.0)
Platelets: 230 10*3/uL (ref 150–400)
RBC: 4.74 MIL/uL (ref 4.22–5.81)
RDW: 14 % (ref 11.5–15.5)
WBC: 8.5 10*3/uL (ref 4.0–10.5)

## 2015-05-27 LAB — APTT: aPTT: 25 seconds (ref 24–37)

## 2015-05-27 LAB — CBG MONITORING, ED: GLUCOSE-CAPILLARY: 67 mg/dL (ref 65–99)

## 2015-05-27 LAB — PROTIME-INR
INR: 1.04 (ref 0.00–1.49)
PROTHROMBIN TIME: 13.8 s (ref 11.6–15.2)

## 2015-05-27 NOTE — ED Notes (Signed)
Pt being transferred to the ED via shuttle for further evaluation for right sided numbness.  Report was called to ED Charge RN, [place].

## 2015-05-27 NOTE — ED Notes (Signed)
Pt reports right sided numbness that has been going on for two weeks.  He states it started in his toes and foot and has gradually made its way up the entire right side of his body.  He has been seen for this twice in the ED.  He came today because he got a pain in the right side of his head and then felt facial numbness.

## 2015-05-27 NOTE — ED Provider Notes (Signed)
CSN: [id number]     Arrival date & time 05/27/15  1844 History   First MD Initiated Contact with Patient 05/27/15 2206     Chief Complaint  Patient presents with  . Numbness     (Consider location/radiation/quality/duration/timing/severity/associated sxs/prior Treatment) HPI Comments: Sent from Urgent Care for eval. Progressively worsening numbness, starting in his R foot, spread upwards to his R trunk and arm.  No fever, no headache, no blurry vision. No CP, SOB. No vomiting. No recent illness. No recent travel.  Patient is a 59 y.o. male presenting with neurologic complaint. The history is provided by the patient.  Neurologic Problem This is a new problem. The current episode started more than 1 week ago. The problem occurs constantly. The problem has been gradually worsening. Pertinent negatives include no chest pain, no abdominal pain and no shortness of breath. Nothing aggravates the symptoms. Nothing relieves the symptoms. He has tried nothing for the symptoms. The treatment provided no relief.    Past Medical History  Diagnosis Date  . Stomach ulcer   . COPD (chronic obstructive pulmonary disease)   . Sinus congestion   . Anxiety   . Hepatitis   . Hepatitis C     Treatment x 8 weeks 6-7 months ago Harvoni   Past Surgical History  Procedure Laterality Date  . Hernia repair    . Stomach surgery      20 years  . Colonoscopy with propofol N/A 04/14/2015    Procedure: COLONOSCOPY WITH PROPOFOL;  Surgeon: Arta Silence, MD;  Location: [facility];  Service: Endoscopy;  Laterality: N/A;   Family History  Problem Relation Age of Onset  . Hypertension Mother   . Anxiety disorder Mother    Social History  Substance Use Topics  . Smoking status: Former Smoker -- 0.50 packs/day for 30 years    Types: Cigarettes    Quit date: 08/04/2013  . Smokeless tobacco: Never Used  . Alcohol Use: No     Comment: QUIT    Review of Systems  Constitutional: Negative for fever.   Respiratory: Negative for cough and shortness of breath.   Cardiovascular: Negative for chest pain and leg swelling.  Gastrointestinal: Negative for nausea, vomiting and abdominal pain.  All other systems reviewed and are negative.     Allergies  Review of patient's allergies indicates no known allergies.  Home Medications   Prior to Admission medications   Medication Sig Start Date End Date Taking? Authorizing Provider  aspirin 81 MG tablet Take 81 mg by mouth every morning.    Yes Historical Provider, MD  budesonide-formoterol (SYMBICORT) 160-4.5 MCG/ACT inhaler Inhale 2 puffs into the lungs 2 (two) times daily as needed (shortness of breath).    Yes Historical Provider, MD  Ferrous Sulfate (IRON) 28 MG TABS Take 28 mg by mouth daily.    Yes Historical Provider, MD  fluticasone (FLONASE) 50 MCG/ACT nasal spray Place 1 spray into both nostrils daily. Patient taking differently: Place 1 spray into both nostrils 2 (two) times daily as needed for allergies or rhinitis.  12/22/14  Yes Melony Overly, MD  oxymetazoline (AFRIN NASAL SPRAY) 0.05 % nasal spray Place 1 spray into both nostrils 3 (three) times daily as needed for congestion. 04/15/15  Yes Merrily Pew, MD  amoxicillin-clavulanate (AUGMENTIN) 875-125 MG per tablet Take 1 tablet by mouth 2 (two) times daily. Patient not taking: Reported on 05/27/2015 04/12/15   Waldemar Dickens, MD  gabapentin (NEURONTIN) 100 MG capsule Take 1 capsule (100  mg total) by mouth at bedtime. Patient not taking: Reported on 05/27/2015 05/20/15   Clayton Bibles, PA-C   BP 134/88 mmHg  Pulse 62  Temp(Src) 98.5 F (36.9 C) (Oral)  Resp 16  SpO2 99% Physical Exam  Constitutional: He is oriented to person, place, and time. He appears well-developed and well-nourished. No distress.  HENT:  Head: Normocephalic and atraumatic.  Mouth/Throat: No oropharyngeal exudate.  Eyes: EOM are normal. Pupils are equal, round, and reactive to light.  Neck: Normal range of  motion. Neck supple.  Cardiovascular: Normal rate and regular rhythm.  Exam reveals no friction rub.   No murmur heard. Pulmonary/Chest: Effort normal and breath sounds normal. No respiratory distress. He has no wheezes. He has no rales.  Abdominal: He exhibits no distension. There is no tenderness. There is no rebound.  Musculoskeletal: Normal range of motion. He exhibits no edema.  Neurological: He is alert and oriented to person, place, and time. A sensory deficit (mild altered light touch on the entire right side of his body) is present. No cranial nerve deficit. GCS eye subscore is 4. GCS verbal subscore is 5. GCS motor subscore is 6.  Reflex Scores:      Patellar reflexes are 2+ on the right side and 2+ on the left side. Skin: He is not diaphoretic.  Nursing note and vitals reviewed.   ED Course  Procedures (including critical care time) Labs Review Labs Reviewed  PROTIME-INR  APTT  CBC  DIFFERENTIAL  COMPREHENSIVE METABOLIC PANEL  I-STAT TROPOININ, ED  CBG MONITORING, ED  I-STAT CHEM 8, ED    Imaging Review Mr Brain Wo Contrast  05/28/2015   CLINICAL DATA:  Initial evaluation for right-sided numbness for 2 weeks.  EXAM: MRI HEAD WITHOUT CONTRAST  TECHNIQUE: Multiplanar, multiecho pulse sequences of the brain and surrounding structures were obtained without intravenous contrast.  COMPARISON:  Prior CT from 04/05/2006.  FINDINGS: Mild diffuse prominence of the CSF containing spaces is compatible with generalized cerebral atrophy. Minimal chronic small vessel ischemic type changes present within the periventricular white matter. No chronic infarct.  No abnormal foci of restricted diffusion to suggest acute intracranial infarct. Gray-white matter differentiation maintained. Normal intravascular flow voids are preserved. No acute or chronic intracranial hemorrhage.  No mass lesion, midline shift, or mass effect. No hydrocephalus. No extra-axial fluid collection.  Craniocervical  junction within normal limits. Mild multilevel degenerative spondylolysis noted within the visualized upper cervical spine.  Incidental note made of a partially empty sella. No acute abnormality about the orbits.  Mild mucosal thickening within ethmoidal air cells. Paranasal sinuses are otherwise clear. No mastoid effusion. Inner ear structures grossly normal.  Bone marrow signal intensity within normal limits. No scalp soft tissue abnormality.  IMPRESSION: 1. No acute or subacute intracranial infarct or other process identified. 2. Mild atrophy with chronic small vessel ischemic disease.   Electronically Signed   By: Jeannine Boga M.D.   On: 05/28/2015 00:14   I have personally reviewed and evaluated these images and lab results as part of my medical decision-making.   EKG Interpretation None      MDM   Final diagnoses:  Numbness on right side    73 show male with history of COPD presents with progressively worsening right-sided numbness. Began his feet has moved up his body to his face. He first noticed his face this morning. Started over 2 weeks ago. He denies any recent illness. He denies any muscle weakness, difficulty with vision or headache. He  was seen at urgent care and was sent here for further evaluation. Here vitals are stable. Patient is doing well. He has mild altered light touch in the entire right half of his body including face. He has normal reflexes. He has normal strength. I do not think this is Bayard Males because he has reflexes in his unilateral.   MR is ok. Given Neuro f/u. Stable for discharge.  Evelina Bucy, MD 05/28/15 [phone]

## 2015-05-27 NOTE — ED Notes (Signed)
Pt in c/o continued right sided numbness, states numbness is to his face, leg and arm, first started three weeks ago, the face got worse today which prompted his visit. Pt states he has been here for this in the past, went to urgent care today and they sent him here for further evaluation. No distress noted, alert and oriented, reports tactile sensation feeling different to right side, normal movement and strength noted

## 2015-05-27 NOTE — ED Provider Notes (Signed)
CSN: [id number]     Arrival date & time 05/27/15  1719 History   First MD Initiated Contact with Patient 05/27/15 1830     Chief Complaint  Patient presents with  . Numbness   (Consider location/radiation/quality/duration/timing/severity/associated sxs/prior Treatment) Patient is a 59 y.o. male presenting with neurologic complaint. The history is provided by the patient.  Neurologic Problem This is a new problem. The current episode started more than 1 week ago (2weeks of progressive ascending neuropathy). The problem has been gradually worsening. Pertinent negatives include no chest pain, no abdominal pain and no headaches. Associated symptoms comments: Numbness to right side of body and face..    Past Medical History  Diagnosis Date  . Stomach ulcer   . COPD (chronic obstructive pulmonary disease)   . Sinus congestion   . Anxiety   . Hepatitis   . Hepatitis C     Treatment x 8 weeks 6-7 months ago Harvoni   Past Surgical History  Procedure Laterality Date  . Hernia repair    . Stomach surgery      20 years  . Colonoscopy with propofol N/A 04/14/2015    Procedure: COLONOSCOPY WITH PROPOFOL;  Surgeon: Arta Silence, MD;  Location: [facility];  Service: Endoscopy;  Laterality: N/A;   Family History  Problem Relation Age of Onset  . Hypertension Mother   . Anxiety disorder Mother    Social History  Substance Use Topics  . Smoking status: Former Smoker -- 0.50 packs/day for 30 years    Types: Cigarettes    Quit date: 08/04/2013  . Smokeless tobacco: Never Used  . Alcohol Use: No     Comment: QUIT    Review of Systems  Cardiovascular: Negative for chest pain.  Gastrointestinal: Negative for abdominal pain.  Neurological: Positive for numbness. Negative for dizziness, seizures, facial asymmetry, speech difficulty, weakness and headaches.    Allergies  Review of patient's allergies indicates no known allergies.  Home Medications   Prior to Admission medications    Medication Sig Start Date End Date Taking? Authorizing Provider  aspirin 81 MG tablet Take 81 mg by mouth every morning.    Yes Historical Provider, MD  gabapentin (NEURONTIN) 100 MG capsule Take 1 capsule (100 mg total) by mouth at bedtime. 05/20/15  Yes Clayton Bibles, PA-C  amoxicillin-clavulanate (AUGMENTIN) 875-125 MG per tablet Take 1 tablet by mouth 2 (two) times daily. 04/12/15   Waldemar Dickens, MD  budesonide-formoterol [facility]) 160-4.5 MCG/ACT inhaler Inhale 2 puffs into the lungs 2 (two) times daily as needed (shortness of breath).     Historical Provider, MD  Ferrous Sulfate (IRON) 28 MG TABS Take 28 mg by mouth daily.     Historical Provider, MD  fluticasone (FLONASE) 50 MCG/ACT nasal spray Place 1 spray into both nostrils daily. Patient taking differently: Place 1 spray into both nostrils 2 (two) times daily as needed for allergies or rhinitis.  12/22/14   Melony Overly, MD  oxymetazoline (AFRIN NASAL SPRAY) 0.05 % nasal spray Place 1 spray into both nostrils 3 (three) times daily as needed for congestion. 04/15/15   Merrily Pew, MD   Meds Ordered and Administered this Visit  Medications - No data to display  BP 131/86 mmHg  Pulse 64  Temp(Src) 98.4 F (36.9 C) (Oral)  Resp 16  SpO2 99% No data found.   Physical Exam  Constitutional: He is oriented to person, place, and time. He appears well-developed and well-nourished.  HENT:  Mouth/Throat: Oropharynx is clear  and moist.  Neck: Normal range of motion. Neck supple. Normal carotid pulses present. Carotid bruit is not present.  Cardiovascular: Regular rhythm.   Lymphadenopathy:    He has no cervical adenopathy.  Neurological: He is alert and oriented to person, place, and time.  Decreased touch sensation to entire right body, motor srength nl . rhomberg neg.  Skin: Skin is warm and dry.  Nursing note and vitals reviewed.   ED Course  Procedures (including critical care time)  Labs Review Labs Reviewed - No data to  display  Imaging Review No results found.   Visual Acuity Review  Right Eye Distance:   Left Eye Distance:   Bilateral Distance:    Right Eye Near:   Left Eye Near:    Bilateral Near:         MDM   1. Paresthesias/numbness        Billy Fischer, MD 05/27/15 1850

## 2015-05-27 NOTE — ED Notes (Signed)
Patient transported to MRI 

## 2015-05-28 NOTE — Discharge Instructions (Signed)

## 2015-08-31 ENCOUNTER — Emergency Department (HOSPITAL_COMMUNITY)
Admission: EM | Admit: 2015-08-31 | Discharge: 2015-08-31 | Disposition: A | Discharge status: Home / Self Care | Payer: No Typology Code available for payment source | Attending: Emergency Medicine | Admitting: Emergency Medicine

## 2015-08-31 ENCOUNTER — Encounter (HOSPITAL_COMMUNITY): Payer: Self-pay | Admitting: *Deleted

## 2015-08-31 DIAGNOSIS — F419 Anxiety disorder, unspecified: Secondary | ICD-10-CM | POA: Insufficient documentation

## 2015-08-31 DIAGNOSIS — Z7982 Long term (current) use of aspirin: Secondary | ICD-10-CM | POA: Insufficient documentation

## 2015-08-31 DIAGNOSIS — Z8619 Personal history of other infectious and parasitic diseases: Secondary | ICD-10-CM | POA: Insufficient documentation

## 2015-08-31 DIAGNOSIS — R1011 Right upper quadrant pain: Secondary | ICD-10-CM | POA: Insufficient documentation

## 2015-08-31 DIAGNOSIS — J449 Chronic obstructive pulmonary disease, unspecified: Secondary | ICD-10-CM | POA: Insufficient documentation

## 2015-08-31 DIAGNOSIS — R300 Dysuria: Secondary | ICD-10-CM | POA: Insufficient documentation

## 2015-08-31 DIAGNOSIS — R6883 Chills (without fever): Secondary | ICD-10-CM | POA: Insufficient documentation

## 2015-08-31 DIAGNOSIS — Z87891 Personal history of nicotine dependence: Secondary | ICD-10-CM | POA: Insufficient documentation

## 2015-08-31 LAB — CBC
HCT: 43.8 % (ref 39.0–52.0)
HEMOGLOBIN: 15.3 g/dL (ref 13.0–17.0)
MCH: 32.8 pg (ref 26.0–34.0)
MCHC: 34.9 g/dL (ref 30.0–36.0)
MCV: 93.8 fL (ref 78.0–100.0)
Platelets: 195 10*3/uL (ref 150–400)
RBC: 4.67 MIL/uL (ref 4.22–5.81)
RDW: 13.6 % (ref 11.5–15.5)
WBC: 8.4 10*3/uL (ref 4.0–10.5)

## 2015-08-31 LAB — COMPREHENSIVE METABOLIC PANEL
ALK PHOS: 52 U/L (ref 38–126)
ALT: 27 U/L (ref 17–63)
AST: 50 U/L — ABNORMAL HIGH (ref 15–41)
Albumin: 4 g/dL (ref 3.5–5.0)
Anion gap: 11 (ref 5–15)
BUN: 7 mg/dL (ref 6–20)
CALCIUM: 9.5 mg/dL (ref 8.9–10.3)
CO2: 25 mmol/L (ref 22–32)
CREATININE: 0.84 mg/dL (ref 0.61–1.24)
Chloride: 103 mmol/L (ref 101–111)
Glucose, Bld: 80 mg/dL (ref 65–99)
Potassium: 4.3 mmol/L (ref 3.5–5.1)
SODIUM: 139 mmol/L (ref 135–145)
Total Bilirubin: 1.4 mg/dL — ABNORMAL HIGH (ref 0.3–1.2)
Total Protein: 6.8 g/dL (ref 6.5–8.1)

## 2015-08-31 LAB — URINALYSIS, ROUTINE W REFLEX MICROSCOPIC
BILIRUBIN URINE: NEGATIVE
Glucose, UA: NEGATIVE mg/dL
HGB URINE DIPSTICK: NEGATIVE
KETONES UR: 15 mg/dL — AB
Leukocytes, UA: NEGATIVE
NITRITE: NEGATIVE
PH: 5 (ref 5.0–8.0)
Protein, ur: NEGATIVE mg/dL
Specific Gravity, Urine: 1.01 (ref 1.005–1.030)

## 2015-08-31 LAB — RAPID URINE DRUG SCREEN, HOSP PERFORMED
AMPHETAMINES: NOT DETECTED
BARBITURATES: NOT DETECTED
Benzodiazepines: NOT DETECTED
Cocaine: NOT DETECTED
Opiates: NOT DETECTED
TETRAHYDROCANNABINOL: NOT DETECTED

## 2015-08-31 LAB — ETHANOL

## 2015-08-31 LAB — ACETAMINOPHEN LEVEL: Acetaminophen (Tylenol), Serum: <10 ug/mL — ABNORMAL LOW (ref 10–30)

## 2015-08-31 NOTE — ED Provider Notes (Signed)
CSN: [id number]     Arrival date & time 08/31/15  1515 History   First MD Initiated Contact with Patient 08/31/15 1834     Chief Complaint  Patient presents with  . Chills   (Consider location/radiation/quality/duration/timing/severity/associated sxs/prior Treatment) HPI  59 y.o. male with a hx of hep C, presents to the Emergency Department today complaining of chills. Reports that the chills occurred this morning. The chills occur when he is lying down, but seem to disappear when he is active. Tried to cover himself in blankets to "sweat it out" with no relief. Has a hx of Hep C, which he was treated. 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 No chest pain,no shortness of breath, no abdominal pain. Also reports some burning with urination that occurred today. No bleeding/discharge. No other symptoms reported.   Past Medical History  Diagnosis Date  . Stomach ulcer   . COPD (chronic obstructive pulmonary disease) ([place])   . Sinus congestion   . Anxiety   . Hepatitis   . Hepatitis C     Treatment x 8 weeks 6-7 months ago Har[facility]   Past Surgical History  Procedure Laterality Date  . Hernia repair    . Stomach surgery      20 years  . Colonoscopy with propofol N/A 04/14/2015    Procedure: COLONOSCOPY WITH PROPOFOL;  Surgeon: Arta Silence, MD;  Location: [facility];  Service: Endoscopy;  Laterality: N/A;   Family History  Problem Relation Age of Onset  . Hypertension Mother   . Anxiety disorder Mother    Social History  Substance Use Topics  . Smoking status: Former Smoker -- 0.50 packs/day for 30 years    Types: Cigarettes    Quit date: 08/04/2013  . Smokeless tobacco: Never Used  . Alcohol Use: No     Comment: QUIT    Review of Systems  Constitutional: Positive for chills. Negative for fever, diaphoresis and fatigue.  HENT: Negative for congestion, sinus  pressure, sore throat and tinnitus.   Eyes: Negative for visual disturbance.  Respiratory: Negative for cough and shortness of breath.   Cardiovascular: Negative for chest pain.  Gastrointestinal: Negative for nausea, vomiting, abdominal pain, diarrhea and constipation.  Endocrine: Negative for cold intolerance and heat intolerance.  Genitourinary: Positive for dysuria.  Musculoskeletal: Negative for back pain.  Skin: Negative for color change.  Neurological: Negative for dizziness, syncope, weakness, numbness and headaches.   Allergies  Review of patient's allergies indicates no known allergies.  Home Medications   Prior to Admission medications   Medication Sig Start Date End Date Taking? Authorizing Provider  aspirin 81 MG tablet Take 81 mg by mouth every morning.    Yes Historical Provider, MD  budesonide-formoterol (SYMBICORT) 160-4.5 MCG/ACT inhaler Inhale 2 puffs into the lungs 2 (two) times daily as needed (shortness of breath).    Yes Historical Provider, MD  Ferrous Sulfate (IRON) 28 MG TABS Take 28 mg by mouth daily.    Yes Historical Provider, MD  fluticasone (FLONASE) 50 MCG/ACT nasal spray Place 1 spray into both nostrils daily. Patient taking differently: Place 1 spray into both nostrils 2 (two) times daily as needed for allergies or rhinitis.  12/22/14  Yes Melony Overly, MD  oxymetazoline (AFRIN NASAL SPRAY) 0.05 % nasal spray Place 1 spray into both nostrils 3 (three) times daily as needed  for congestion. 04/15/15  Yes Jason Mesner, MD   BP 132/98 mmHg  Pulse 71  Temp(Src) 98.9 F (37.2 C)  Resp 18  Ht 6' 1.5" (1.867 m)  Wt 69.457 kg  BMI 19.93 kg/m2  SpO2 98%   Physical Exam  Constitutional: He is oriented to person, place, and time. He appears well-developed and well-nourished.  HENT:  Head: Normocephalic and atraumatic.  Right Ear: Hearing, tympanic membrane, external ear and ear canal normal.  Left Ear: Hearing, tympanic membrane, external ear and ear canal  normal.  Nose: Nose normal.  Mouth/Throat: Uvula is midline, oropharynx is clear and moist and mucous membranes are normal.  Eyes: Conjunctivae and EOM are normal. Pupils are equal, round, and reactive to light. Scleral icterus is present.  Neck: Trachea normal. Neck supple.  Cardiovascular: Normal rate, regular rhythm, S1 normal, S2 normal, normal heart sounds, intact distal pulses and normal pulses.   Pulmonary/Chest: Effort normal and breath sounds normal.  Abdominal: Soft. He exhibits no distension. There is no hepatosplenomegaly or hepatomegaly. There is tenderness in the right upper quadrant. There is no rigidity, no rebound, no guarding, no CVA tenderness, no tenderness at McBurney's point and negative Murphy's sign.  Lymphadenopathy:    He has no cervical adenopathy.  Neurological: He is alert and oriented to person, place, and time.  Skin: Skin is warm and dry.  Psychiatric: He has a normal mood and affect. His behavior is normal. Thought content normal.  Nursing note and vitals reviewed.  ED Course  Procedures (including critical care time) Labs Review Labs Reviewed  COMPREHENSIVE METABOLIC PANEL - Abnormal; Notable for the following:    AST 50 (*)    Total Bilirubin 1.4 (*)    All other components within normal limits  URINALYSIS, ROUTINE W REFLEX MICROSCOPIC (NOT AT [facility]) - Abnormal; Notable for the following:    Ketones, ur 15 (*)    All other components within normal limits  ACETAMINOPHEN LEVEL - Abnormal; Notable for the following:    Acetaminophen (Tylenol), Serum <10 (*)    All other components within normal limits  CBC  ETHANOL  URINE RAPID DRUG SCREEN, HOSP PERFORMED   Imaging Review No results found. I have personally reviewed and evaluated these images and lab results as part of my medical decision-making.   EKG Interpretation None      MDM  I have reviewed relevant laboratory values.  I have reviewed the relevant EKG. I obtained HPI from  historian. Cases discussed with Attending Physician  ED Course:  Assessment: 40y M in NAD. VS stable. Symptoms isolated to chills. Labs were unremarkable. Pt is able to ambulate and tolerated PO. Will have pt follow up as outpatient for further management of chills.   Disposition/Plan:  D/C home Additional Verbal discharge instructions given and discussed with patient.  Pt Instructed to f/u with PCP in the next 48 hours for evaluation and treatment of symptoms.      Patient was discussed with Noemi Chapel, MD   Final diagnoses:  Chills       Shary Decamp, PA-C 09/01/15 1127  Noemi Chapel, MD 09/04/15 2203

## 2015-08-31 NOTE — ED Notes (Signed)
No cough no temp no pain anywhere

## 2015-08-31 NOTE — ED Notes (Signed)
The pt is c/o chills since yesterday no other symptoms

## 2015-08-31 NOTE — Discharge Instructions (Signed)
Please read and follow all provided instructions.  Your diagnoses today include:  1. Chills    Tests performed today include:  Vital signs. See below for your results today.   Medications prescribed:   None   Home care instructions:  Follow any educational materials contained in this packet.  Follow-up instructions: Please follow-up with your primary care provider in the next 48 hours for further evaluation of symptoms and treatment   Return instructions:   Please return to the Emergency Department if you do not get better, if you get worse, or new symptoms OR  - Fever (temperature greater than 101.44F)  - Bleeding that does not stop with holding pressure to the area    -Severe pain (please note that you may be more sore the day after your accident)  - Chest Pain  - Difficulty breathing  - Severe nausea or vomiting  - Inability to tolerate food and liquids  - Passing out  - Skin becoming red around your wounds  - Change in mental status (confusion or lethargy)  - New numbness or weakness     Please return if you have any other emergent concerns.  Additional Information:  Your vital signs today were: BP 132/98 mmHg   Pulse 71   Temp(Src) 98.9 F (37.2 C)   Resp 18   Ht 6' 1.5" (1.867 m)   Wt 69.457 kg   BMI 19.93 kg/m2   SpO2 98% If your blood pressure (BP) was elevated above 135/85 this visit, please have this repeated by your doctor within one month. ---------------

## 2015-08-31 NOTE — ED Provider Notes (Signed)
The patient is a 59 year old male, he has a history of drinking heavily this weekend, this after 3 years of sobriety. He reports today saying that he's been having some chills, this occurred this morning, he has very few complaints at this time. No headache, no nausea vomiting or diarrhea, no coughing shortness of breath or chest pain. On exam he has a soft abdomen, I do not elicit any tenderness, he has no masses, he has clear heart and lung sounds without murmurs, he has moist mucous membranes and no peripheral edema, stable gait. Evaluated with labs, check liver function, urinalysis, otherwise the patient is well-appearing and anticipate outpatient management if no acute findings are found  Medical screening examination/treatment/procedure(s) were conducted as a shared visit with non-physician practitioner(s) and myself.  I personally evaluated the patient during the encounter.  Clinical Impression:   Final diagnoses:  Chills         Noemi Chapel, MD 09/04/15 2203

## 2015-11-18 ENCOUNTER — Encounter (HOSPITAL_COMMUNITY): Payer: Self-pay

## 2015-11-18 ENCOUNTER — Emergency Department (HOSPITAL_COMMUNITY): Payer: Self-pay

## 2015-11-18 ENCOUNTER — Emergency Department (HOSPITAL_COMMUNITY)
Admission: EM | Admit: 2015-11-18 | Discharge: 2015-11-18 | Disposition: A | Discharge status: Home / Self Care | Payer: Self-pay | Attending: Emergency Medicine | Admitting: Emergency Medicine

## 2015-11-18 DIAGNOSIS — Z7982 Long term (current) use of aspirin: Secondary | ICD-10-CM | POA: Insufficient documentation

## 2015-11-18 DIAGNOSIS — J449 Chronic obstructive pulmonary disease, unspecified: Secondary | ICD-10-CM | POA: Insufficient documentation

## 2015-11-18 DIAGNOSIS — Z8619 Personal history of other infectious and parasitic diseases: Secondary | ICD-10-CM | POA: Insufficient documentation

## 2015-11-18 DIAGNOSIS — R079 Chest pain, unspecified: Secondary | ICD-10-CM | POA: Insufficient documentation

## 2015-11-18 DIAGNOSIS — F17210 Nicotine dependence, cigarettes, uncomplicated: Secondary | ICD-10-CM | POA: Insufficient documentation

## 2015-11-18 DIAGNOSIS — Z8659 Personal history of other mental and behavioral disorders: Secondary | ICD-10-CM | POA: Insufficient documentation

## 2015-11-18 DIAGNOSIS — Z8719 Personal history of other diseases of the digestive system: Secondary | ICD-10-CM | POA: Insufficient documentation

## 2015-11-18 DIAGNOSIS — R51 Headache: Secondary | ICD-10-CM | POA: Insufficient documentation

## 2015-11-18 DIAGNOSIS — J3489 Other specified disorders of nose and nasal sinuses: Secondary | ICD-10-CM | POA: Insufficient documentation

## 2015-11-18 DIAGNOSIS — Z7951 Long term (current) use of inhaled steroids: Secondary | ICD-10-CM | POA: Insufficient documentation

## 2015-11-18 LAB — CBC
HCT: 44.5 % (ref 39.0–52.0)
Hemoglobin: 15.6 g/dL (ref 13.0–17.0)
MCH: 32.8 pg (ref 26.0–34.0)
MCHC: 35.1 g/dL (ref 30.0–36.0)
MCV: 93.7 fL (ref 78.0–100.0)
PLATELETS: 210 10*3/uL (ref 150–400)
RBC: 4.75 MIL/uL (ref 4.22–5.81)
RDW: 13.6 % (ref 11.5–15.5)
WBC: 9.1 10*3/uL (ref 4.0–10.5)

## 2015-11-18 LAB — I-STAT TROPONIN, ED
TROPONIN I, POC: 0 ng/mL (ref 0.00–0.08)
TROPONIN I, POC: 0 ng/mL (ref 0.00–0.08)

## 2015-11-18 LAB — BASIC METABOLIC PANEL
Anion gap: 11 (ref 5–15)
BUN: 18 mg/dL (ref 6–20)
CHLORIDE: 103 mmol/L (ref 101–111)
CO2: 24 mmol/L (ref 22–32)
CREATININE: 0.93 mg/dL (ref 0.61–1.24)
Calcium: 9 mg/dL (ref 8.9–10.3)
Glucose, Bld: 80 mg/dL (ref 65–99)
Potassium: 4.9 mmol/L (ref 3.5–5.1)
SODIUM: 138 mmol/L (ref 135–145)

## 2015-11-18 MED ORDER — ASPIRIN 81 MG PO CHEW
324.0000 mg | CHEWABLE_TABLET | Freq: Once | ORAL | Status: AC
  Administered 2015-11-18: 324 mg via ORAL
  Filled 2015-11-18: qty 4

## 2015-11-18 MED ORDER — OXYMETAZOLINE HCL 0.05 % NA SOLN
1.0000 | Freq: Once | NASAL | Status: AC
  Administered 2015-11-18: 1 via NASAL
  Filled 2015-11-18: qty 15

## 2015-11-18 NOTE — ED Provider Notes (Signed)
CSN: [id number]     Arrival date & time 11/18/15  0433 History   First MD Initiated Contact with Patient 11/18/15 [phone]     Chief Complaint  Patient presents with  . Chest Pain  . Headache     (Consider location/radiation/quality/duration/timing/severity/associated sxs/prior Treatment) HPI Comments: 60 year old male with COPD, hepatitis C status post treatment presents with chest pain and sinus pressure. Patient states that around 11 PM while he was in bed he began having chest pain that was across his upper chest and shoulders. He took 1 baby aspirin at 11 PM. He denies any shortness of breath, diaphoresis, or nausea/vomiting. He also reports a brief episode of epistaxis tonight and ongoing problems with nasal congestion and sinus pressure. He states that he has developed a headache from all the sinus pain. No fevers.  No personal or family history of blood clots. No recent travel or history of cancer. No family history of heart disease.  Patient is a 60 y.o. male presenting with chest pain and headaches. The history is provided by the patient.  Chest Pain Associated symptoms: headache   Headache   Past Medical History  Diagnosis Date  . Stomach ulcer   . COPD (chronic obstructive pulmonary disease) ([place])   . Sinus congestion   . Anxiety   . Hepatitis   . Hepatitis C     Treatment x 8 weeks 6-7 months ago [facility]   Past Surgical History  Procedure Laterality Date  . Hernia repair    . Stomach surgery      20 years  . Colonoscopy with propofol N/A 04/14/2015    Procedure: COLONOSCOPY WITH PROPOFOL;  Surgeon: Arta Silence, MD;  Location: [facility];  Service: Endoscopy;  Laterality: N/A;   Family History  Problem Relation Age of Onset  . Hypertension Mother   . Anxiety disorder Mother    Social History  Substance Use Topics  . Smoking status: Current Every Day Smoker -- 0.50 packs/day for 30 years    Types: Cigarettes    Last Attempt to Quit: 08/04/2013  . Smokeless  tobacco: Never Used  . Alcohol Use: Yes     Comment: QUIT    Review of Systems  Cardiovascular: Positive for chest pain.  Neurological: Positive for headaches.   10 Systems reviewed and are negative for acute change except as noted in the HPI.    Allergies  Review of patient's allergies indicates no known allergies.  Home Medications   Prior to Admission medications   Medication Sig Start Date End Date Taking? Authorizing Provider  aspirin 81 MG tablet Take 81 mg by mouth every morning.    Yes Historical Provider, MD  budesonide-formoterol (SYMBICORT) 160-4.5 MCG/ACT inhaler Inhale 2 puffs into the lungs 2 (two) times daily as needed (shortness of breath).    Yes Historical Provider, MD  fluticasone (FLONASE) 50 MCG/ACT nasal spray Place 1 spray into both nostrils daily. Patient taking differently: Place 1 spray into both nostrils 2 (two) times daily as needed for allergies or rhinitis.  12/22/14  Yes Melony Overly, MD  oxymetazoline (AFRIN NASAL SPRAY) 0.05 % nasal spray Place 1 spray into both nostrils 3 (three) times daily as needed for congestion. 04/15/15  Yes Merrily Pew, MD   BP 122/97 mmHg  Pulse 67  Temp(Src) 98.1 F (36.7 C) (Oral)  Resp 16  Ht 6\' 1"  (1.854 m)  Wt 154 lb (69.854 kg)  BMI 20.32 kg/m2  SpO2 97% Physical Exam  Constitutional: He is oriented  to person, place, and time. He appears well-developed and well-nourished. No distress.  HENT:  Head: Normocephalic and atraumatic.  Small amount dried blood L naris, Moist mucous membranes  Eyes: Conjunctivae are normal. Pupils are equal, round, and reactive to light.  Neck: Neck supple.  Cardiovascular: Normal rate, regular rhythm and normal heart sounds.   No murmur heard. Pulmonary/Chest: Effort normal and breath sounds normal.  Abdominal: Soft. Bowel sounds are normal. He exhibits no distension. There is no tenderness.  Musculoskeletal: He exhibits no edema.  Neurological: He is alert and oriented to person,  place, and time.  Fluent speech  Skin: Skin is warm and dry.  Psychiatric: He has a normal mood and affect. Judgment normal.  Nursing note and vitals reviewed.   ED Course  Procedures (including critical care time) Labs Review Labs Reviewed  BASIC METABOLIC PANEL  CBC  I-STAT [place], ED    Imaging Review Dg Chest 2 View  11/18/2015  CLINICAL DATA:  Chest pain EXAM: CHEST  2 VIEW COMPARISON:  09/27/2014 FINDINGS: Normal heart size and mediastinal contours. Hyperinflation correlating with COPD history. Streaky and nodular opacity at the right apex has an unchanged appearance by radiography. No acute infiltrate or edema. No effusion or pneumothorax. No acute osseous findings. IMPRESSION: 1. No evidence of acute cardiopulmonary disease. 2. Chronic right apical opacity, reference chest CT report 10/06/2014. Electronically Signed   By: Monte Fantasia M.D.   On: 11/18/2015 05:47   I have personally reviewed and evaluated these lab results as part of my medical decision-making.   EKG Interpretation   Date/Time:  Thursday November 18 2015 04:42:43 EDT Ventricular Rate:  74 PR Interval:  138 QRS Duration: 100 QT Interval:  390 QTC Calculation: 433 R Axis:   94 Text Interpretation:  Sinus rhythm Probable left atrial enlargement  Consider right ventricular hypertrophy ST elevation suggests acute  pericarditis No significant change since last tracing Confirmed by LITTLE  MD, RACHEL [phone]) on 11/18/2015 5:53:53 AM     Medications  aspirin chewable tablet 324 mg (324 mg Oral Given 11/18/15 0616)  oxymetazoline (AFRIN) 0.05 % nasal spray 1 spray (1 spray Each Nare Given 11/18/15 0616)    MDM   Final diagnoses:  Chest pain, unspecified chest pain type  Sinus pressure   Patient presents with chest pain that began this evening as well as headache from ongoing problems with sinus congestion and pressure. On exam, he was well-appearing with normal vital signs. No active bleeding from his  nose. EKG without acute changes from previous. Gave the patient aspirin as well as Afrin and obtained above lab work as well as chest x-ray. Lab work including initial troponin is normal. Chest x-ray shows chronic right apical opacity noted on previous CT; pt is aware of need for follow-up CT in 4 months. I have ordered a 3 hour troponin. The patient has no risk factors for PE and is not complaining of any shortness of breath. He does not have any hypoxia. Therefore, I feel that PE is unlikely. No sudden ripping or tearing chest pain to suggest aortic dissection. His HEART score is </=3 thus if second troponin is negative, I feel that he is safe for discharge with follow-up with his PCP for consideration of a stress test. I discussed this plan with the patient as well as supportive care instructions including nasal saline and Flonase for his sinus congestion symptoms. He has voiced understanding. Disposition pending results of second troponin; I am signing out to oncoming  provider.   Sharlett Iles, MD 11/18/15 [phone]

## 2015-11-18 NOTE — ED Notes (Signed)
esignature not working. Pt agreeable to discharge

## 2015-11-18 NOTE — ED Notes (Signed)
Pt from home by [facility] for CP and headache. Pt states he has sinus pressure across the front of his face. Pt denies N/V. Pain started last night around 11pm

## 2015-12-26 ENCOUNTER — Emergency Department (HOSPITAL_COMMUNITY)
Admission: EM | Admit: 2015-12-26 | Discharge: 2015-12-26 | Disposition: A | Discharge status: Home / Self Care | Payer: No Typology Code available for payment source | Attending: Emergency Medicine | Admitting: Emergency Medicine

## 2015-12-26 ENCOUNTER — Encounter (HOSPITAL_COMMUNITY): Payer: Self-pay | Admitting: Emergency Medicine

## 2015-12-26 DIAGNOSIS — Z8619 Personal history of other infectious and parasitic diseases: Secondary | ICD-10-CM | POA: Insufficient documentation

## 2015-12-26 DIAGNOSIS — J029 Acute pharyngitis, unspecified: Secondary | ICD-10-CM

## 2015-12-26 DIAGNOSIS — R61 Generalized hyperhidrosis: Secondary | ICD-10-CM | POA: Insufficient documentation

## 2015-12-26 DIAGNOSIS — Z8659 Personal history of other mental and behavioral disorders: Secondary | ICD-10-CM | POA: Insufficient documentation

## 2015-12-26 DIAGNOSIS — H6121 Impacted cerumen, right ear: Secondary | ICD-10-CM | POA: Insufficient documentation

## 2015-12-26 DIAGNOSIS — F17210 Nicotine dependence, cigarettes, uncomplicated: Secondary | ICD-10-CM | POA: Insufficient documentation

## 2015-12-26 DIAGNOSIS — Z7982 Long term (current) use of aspirin: Secondary | ICD-10-CM | POA: Insufficient documentation

## 2015-12-26 DIAGNOSIS — J449 Chronic obstructive pulmonary disease, unspecified: Secondary | ICD-10-CM | POA: Insufficient documentation

## 2015-12-26 DIAGNOSIS — H9201 Otalgia, right ear: Secondary | ICD-10-CM | POA: Insufficient documentation

## 2015-12-26 DIAGNOSIS — Z7951 Long term (current) use of inhaled steroids: Secondary | ICD-10-CM | POA: Insufficient documentation

## 2015-12-26 DIAGNOSIS — Z8719 Personal history of other diseases of the digestive system: Secondary | ICD-10-CM | POA: Insufficient documentation

## 2015-12-26 LAB — RAPID STREP SCREEN (MED CTR MEBANE ONLY): Streptococcus, Group A Screen (Direct): NEGATIVE

## 2015-12-26 MED ORDER — PENICILLIN V POTASSIUM 500 MG PO TABS: 500.0000 mg | ORAL_TABLET | Freq: Two times a day (BID) | ORAL | Status: AC

## 2015-12-26 NOTE — ED Provider Notes (Signed)
CSN: [id number]     Arrival date & time 12/26/15  1652 History  By signing my name below, I, Hansel Feinstein, attest that this documentation has been prepared under the direction and in the presence of Shary Decamp, PA-C. Electronically Signed: Hansel Feinstein, ED Scribe. 12/26/2015. 5:31 PM.    Chief Complaint  Patient presents with  . Sore Throat   The history is provided by the patient. No language interpreter was used.   HPI Comments: [name] [name] is a 60 y.o. male who presents to the Emergency Department complaining of moderate sore throat onset 4 days ago with associated diaphoresis, chills, right-sided otalgia. Pt describes his pain as dull and 8/10 in severity. He states he has also noticed a painful area of swelling to his right neck, which is slightly worsened with neck rotation. Pt states he has tried salt water rinses, ASA, tylenol, throat lozenges, chloraseptic spray with mild to moderate relief of pain. Pt states his pain is worsened with swallowing. Normal food and fluid intake.  He reports h/o similar sore throat, but not as severe. No known sick contacts with similar symptoms. NKDA. Denies fever, difficulty swallowing or tolerating secretions, nausea, emesis, rhinorrhea, rash.   Past Medical History  Diagnosis Date  . Stomach ulcer   . COPD (chronic obstructive pulmonary disease) ([place])   . Sinus congestion   . Anxiety   . Hepatitis   . Hepatitis C     Treatment x 8 weeks 6-7 months ago Harvoni   Past Surgical History  Procedure Laterality Date  . Hernia repair    . Stomach surgery      20 years  . Colonoscopy with propofol N/A 04/14/2015    Procedure: COLONOSCOPY WITH PROPOFOL;  Surgeon: Arta Silence, MD;  Location: [facility];  Service: Endoscopy;  Laterality: N/A;   Family History  Problem Relation Age of Onset  . Hypertension Mother   . Anxiety disorder Mother    Social History  Substance Use Topics  . Smoking status: Current Every Day Smoker -- 0.50 packs/day for  30 years    Types: Cigarettes    Last Attempt to Quit: 08/04/2013  . Smokeless tobacco: Never Used  . Alcohol Use: Yes    Review of Systems A complete 10 system review of systems was obtained and all systems are negative except as noted in the HPI and PMH.    Allergies  Review of patient's allergies indicates no known allergies.  Home Medications   Prior to Admission medications   Medication Sig Start Date End Date Taking? Authorizing Provider  aspirin 81 MG tablet Take 81 mg by mouth every morning.     Historical Provider, MD  budesonide-formoterol (SYMBICORT) 160-4.5 MCG/ACT inhaler Inhale 2 puffs into the lungs 2 (two) times daily as needed (shortness of breath).     Historical Provider, MD  fluticasone (FLONASE) 50 MCG/ACT nasal spray Place 1 spray into both nostrils daily. Patient taking differently: Place 1 spray into both nostrils 2 (two) times daily as needed for allergies or rhinitis.  12/22/14   Melony Overly, MD  oxymetazoline (AFRIN NASAL SPRAY) 0.05 % nasal spray Place 1 spray into both nostrils 3 (three) times daily as needed for congestion. 04/15/15   Corene Cornea Mesner, MD   BP 132/95 mmHg  Pulse 88  Temp(Src) 98.5 F (36.9 C) (Oral)  Resp 16  SpO2 99% Physical Exam  Constitutional: He is oriented to person, place, and time. He appears well-developed and well-nourished.  HENT:  Head:  Normocephalic.  Left Ear: Tympanic membrane normal.  Mouth/Throat: Uvula is midline and mucous membranes are normal. No trismus in the jaw. No uvula swelling. Oropharyngeal exudate and posterior oropharyngeal erythema present. No posterior oropharyngeal edema or tonsillar abscesses.  No trismus, no uvular deviation. No unilateral swelling. Pt is not in a tripod position. Posterior oropharyngeal erythema. No evidence of tonsillar abscess. Airway patent. Post-nasal drip present.   Right TM with cerumen present. Left TM normal.   Eyes: Conjunctivae and EOM are normal. Pupils are equal, round,  and reactive to light.  Neck: Normal range of motion. No tracheal deviation present.  Full active and passive ROM  Cardiovascular: Normal rate.   Pulmonary/Chest: Effort normal. No stridor. No respiratory distress.  Abdominal: He exhibits no distension.  Musculoskeletal: Normal range of motion.  Lymphadenopathy:    He has cervical adenopathy (right anterior superficial).  Neurological: He is alert and oriented to person, place, and time.  Skin: Skin is warm and dry.  Psychiatric: He has a normal mood and affect. His behavior is normal.  Nursing note and vitals reviewed.   ED Course  Procedures (including critical care time) DIAGNOSTIC STUDIES: Oxygen Saturation is 99% on RA, normal by my interpretation.    COORDINATION OF CARE: 5:21 PM Discussed treatment plan with pt at bedside which includes rapid strep and pt agreed to plan.   Labs Review Labs Reviewed  RAPID STREP SCREEN (NOT AT [facility])  RAPID STREP SCREEN (NOT AT [facility])  CULTURE, GROUP A STREP [facility])   I have personally reviewed and evaluated these lab results as part of my medical decision-making.   MDM  I have reviewed and evaluated the relevant laboratory values I have reviewed the relevant previous healthcare records. I obtained HPI from historian.  ED Course:  Assessment: Pt with negative strep. Diagnosis of viral pharyngitis. No abx indicated at this time. Discussed that results of strep culture are pending and patient will be informed if positive result and abx will be called in at that time. Due to examination and symptoms. Possible positive culture. No evidence of dehydration. Pt is tolerating secretions. Presentation not concerning for peritonsillar abscess or spread of infection to deep spaces of the throat; patent airway. Specific return precautions discussed. Recommended PCP follow up. Pt appears safe for discharge.   Disposition/Plan:  DC home Additional Verbal discharge instructions given and discussed with  patient.  Pt Instructed to f/u with PCP in the next week for evaluation and treatment of symptoms. Return precautions given Pt acknowledges and agrees with plan  Supervising Physician Tanna Furry, MD   Final diagnoses:  Pharyngitis    I personally performed the services described in this documentation, which was scribed in my presence. The recorded information has been reviewed and is accurate.   Shary Decamp, PA-C 12/26/15 1951  Tanna Furry, MD 01/07/16 [phone]

## 2015-12-26 NOTE — Discharge Instructions (Signed)
Please read and follow all provided instructions.  Your diagnoses today include:  1. Pharyngitis    Tests performed today include:  Vital signs. See below for your results today.   Medications prescribed:   Take as prescribed   Home care instructions:  Follow any educational materials contained in this packet.  Follow-up instructions: Please follow-up with your primary care provider in the next 48 hours for further evaluation of symptoms and treatment   Return instructions:   Please return to the Emergency Department if you do not get better, if you get worse, or new symptoms OR  - Fever (temperature greater than 101.38F)  - Bleeding that does not stop with holding pressure to the area    -Severe pain (please note that you may be more sore the day after your accident)  - Chest Pain  - Difficulty breathing  - Severe nausea or vomiting  - Inability to tolerate food and liquids  - Passing out  - Skin becoming red around your wounds  - Change in mental status (confusion or lethargy)  - New numbness or weakness     Please return if you have any other emergent concerns.  Additional Information:  Your vital signs today were: BP 132/95 mmHg   Pulse 88   Temp(Src) 98.5 F (36.9 C) (Oral)   Resp 16   SpO2 99% If your blood pressure (BP) was elevated above 135/85 this visit, please have this repeated by your doctor within one month. ---------------

## 2015-12-26 NOTE — ED Notes (Signed)
Pt c/o sore throat x 3 days. Pt reports swelling to right side of throat. Pt has tried over the counter medications for sore throat without relief. Pt presents with redness and swelling to right side of throat.

## 2015-12-29 LAB — CULTURE, GROUP A STREP (THRC)

## 2016-03-21 ENCOUNTER — Emergency Department (HOSPITAL_COMMUNITY)
Admission: EM | Admit: 2016-03-21 | Discharge: 2016-03-21 | Disposition: A | Discharge status: Home / Self Care | Payer: Medicaid Other | Attending: Emergency Medicine | Admitting: Emergency Medicine

## 2016-03-21 ENCOUNTER — Emergency Department (HOSPITAL_COMMUNITY): Payer: Medicaid Other

## 2016-03-21 ENCOUNTER — Encounter (HOSPITAL_COMMUNITY): Payer: Self-pay

## 2016-03-21 DIAGNOSIS — F17210 Nicotine dependence, cigarettes, uncomplicated: Secondary | ICD-10-CM | POA: Insufficient documentation

## 2016-03-21 DIAGNOSIS — R109 Unspecified abdominal pain: Secondary | ICD-10-CM

## 2016-03-21 DIAGNOSIS — L988 Other specified disorders of the skin and subcutaneous tissue: Secondary | ICD-10-CM | POA: Insufficient documentation

## 2016-03-21 DIAGNOSIS — H1589 Other disorders of sclera: Secondary | ICD-10-CM | POA: Insufficient documentation

## 2016-03-21 DIAGNOSIS — J3489 Other specified disorders of nose and nasal sinuses: Secondary | ICD-10-CM

## 2016-03-21 DIAGNOSIS — Z7982 Long term (current) use of aspirin: Secondary | ICD-10-CM | POA: Diagnosis not present

## 2016-03-21 DIAGNOSIS — R1013 Epigastric pain: Secondary | ICD-10-CM | POA: Insufficient documentation

## 2016-03-21 DIAGNOSIS — J449 Chronic obstructive pulmonary disease, unspecified: Secondary | ICD-10-CM | POA: Diagnosis not present

## 2016-03-21 DIAGNOSIS — R0981 Nasal congestion: Secondary | ICD-10-CM | POA: Diagnosis present

## 2016-03-21 LAB — COMPREHENSIVE METABOLIC PANEL
ALT: 18 U/L (ref 17–63)
AST: 25 U/L (ref 15–41)
Albumin: 4.2 g/dL (ref 3.5–5.0)
Alkaline Phosphatase: 47 U/L (ref 38–126)
Anion gap: 7 (ref 5–15)
BUN: 8 mg/dL (ref 6–20)
CHLORIDE: 104 mmol/L (ref 101–111)
CO2: 27 mmol/L (ref 22–32)
CREATININE: 0.84 mg/dL (ref 0.61–1.24)
Calcium: 9.9 mg/dL (ref 8.9–10.3)
GFR calc Af Amer: >60 mL/min (ref >60)
GFR calc non Af Amer: >60 mL/min (ref >60)
Glucose, Bld: 96 mg/dL (ref 65–99)
Potassium: 4.9 mmol/L (ref 3.5–5.1)
SODIUM: 138 mmol/L (ref 135–145)
Total Bilirubin: 0.7 mg/dL (ref 0.3–1.2)
Total Protein: 7.2 g/dL (ref 6.5–8.1)

## 2016-03-21 LAB — URINALYSIS, ROUTINE W REFLEX MICROSCOPIC
Bilirubin Urine: NEGATIVE
GLUCOSE, UA: NEGATIVE mg/dL
HGB URINE DIPSTICK: NEGATIVE
KETONES UR: NEGATIVE mg/dL
Leukocytes, UA: NEGATIVE
Nitrite: NEGATIVE
PROTEIN: NEGATIVE mg/dL
SPECIFIC GRAVITY, URINE: 1.016 (ref 1.005–1.030)
pH: 6.5 (ref 5.0–8.0)

## 2016-03-21 LAB — CBC WITH DIFFERENTIAL/PLATELET
BASOS ABS: 0 10*3/uL (ref 0.0–0.1)
Basophils Relative: 0 %
EOS ABS: 0.2 10*3/uL (ref 0.0–0.7)
EOS PCT: 3 %
HCT: 47 % (ref 39.0–52.0)
Hemoglobin: 16.2 g/dL (ref 13.0–17.0)
LYMPHS PCT: 37 %
Lymphs Abs: 3.2 10*3/uL (ref 0.7–4.0)
MCH: 32.1 pg (ref 26.0–34.0)
MCHC: 34.5 g/dL (ref 30.0–36.0)
MCV: 93.3 fL (ref 78.0–100.0)
Monocytes Absolute: 0.7 10*3/uL (ref 0.1–1.0)
Monocytes Relative: 9 %
Neutro Abs: 4.4 10*3/uL (ref 1.7–7.7)
Neutrophils Relative %: 51 %
PLATELETS: 245 10*3/uL (ref 150–400)
RBC: 5.04 MIL/uL (ref 4.22–5.81)
RDW: 13.3 % (ref 11.5–15.5)
WBC: 8.6 10*3/uL (ref 4.0–10.5)

## 2016-03-21 LAB — LIPASE, BLOOD: Lipase: 19 U/L (ref 11–51)

## 2016-03-21 MED ORDER — AMOXICILLIN 500 MG PO CAPS: 500.0000 mg | ORAL_CAPSULE | Freq: Three times a day (TID) | ORAL | Status: DC

## 2016-03-21 NOTE — Discharge Instructions (Signed)
Take the prescribed medication as directed.  May continue your nasal sprays with this to help with congestion. Follow-up with Dr. Marlou Sa.  He may want you to follow-up with Dr. Paulita Fujita again if you continue having abdominal discomfort. Copies of labs and imaging attached on back for your doctor to review. Return to the ED for new or worsening symptoms.

## 2016-03-21 NOTE — ED Notes (Signed)
Pt c/o chronic sinusitis, worse x 2 weeks. Also c/o right lower rib pain starting when sinus congestion started. OTC meds mildly relieved congestion. Pt has not been able to see PCP. Resp e/u, VSS, NAD.

## 2016-03-21 NOTE — ED Notes (Signed)
Pt transported to [facility]

## 2016-03-21 NOTE — ED Provider Notes (Signed)
CSN: [id number]     Arrival date & time 03/21/16  [id number] History   First MD Initiated Contact with Patient 03/21/16 [phone]     Chief Complaint  Patient presents with  . Recurrent Sinusitis     (Consider location/radiation/quality/duration/timing/severity/associated sxs/prior Treatment) The history is provided by the patient and medical records.    60 year old male with history of COPD, stomach ulcers, chronic sinus congestion, anxiety, hepatitis C, presenting to the ED for sinus congestion. He states this is been ongoing for the past 2 weeks. He reports nasal congestion, sinus pressure under his eyes, and pressure sensation in his head.  He states his head "feels like a balloon".  He denies any dizziness, weakness, numbness, or confusion.  He has taken multiple over-the-counter cold medications without relief.  He has also been using his prescribed nasal sprays without relief.  Patient also with right upper abdominal pain. He states this began when his sinus pressure worsened.  He states it is more in his right upper and epigastric abdomen. He denies any nausea, vomiting, or diarrhea. Bowel movements have been normal without melena or hematochezia. States he has been diagnosed with hepatitis C and told that he had "spots" on his liver. He has not had any further evaluation for these findings. He did recently have a colonoscopy which revealed a few polyps.  He is seen by GI, Dr. Paulita Fujita.  States his eyes have appeared somewhat "yellow" recently as well.  States he is worried about his liver.  Past Medical History  Diagnosis Date  . Stomach ulcer   . COPD (chronic obstructive pulmonary disease) ([place])   . Sinus congestion   . Anxiety   . Hepatitis   . Hepatitis C     Treatment x 8 weeks 6-7 months ago Harvoni   Past Surgical History  Procedure Laterality Date  . Hernia repair    . Stomach surgery      20 years  . Colonoscopy with propofol N/A 04/14/2015    Procedure: COLONOSCOPY WITH PROPOFOL;   Surgeon: Arta Silence, MD;  Location: [facility];  Service: Endoscopy;  Laterality: N/A;   Family History  Problem Relation Age of Onset  . Hypertension Mother   . Anxiety disorder Mother    Social History  Substance Use Topics  . Smoking status: Current Every Day Smoker -- 0.50 packs/day for 30 years    Types: Cigarettes    Last Attempt to Quit: 08/04/2013  . Smokeless tobacco: Never Used  . Alcohol Use: Yes    Review of Systems  HENT: Positive for congestion and sinus pressure.   Gastrointestinal: Positive for abdominal pain.  All other systems reviewed and are negative.     Allergies  Review of patient's allergies indicates no known allergies.  Home Medications   Prior to Admission medications   Medication Sig Start Date End Date Taking? Authorizing Provider  aspirin 81 MG tablet Take 81 mg by mouth every morning.     Historical Provider, MD  budesonide-formoterol (SYMBICORT) 160-4.5 MCG/ACT inhaler Inhale 2 puffs into the lungs 2 (two) times daily as needed (shortness of breath).     Historical Provider, MD  fluticasone (FLONASE) 50 MCG/ACT nasal spray Place 1 spray into both nostrils daily. Patient taking differently: Place 1 spray into both nostrils 2 (two) times daily as needed for allergies or rhinitis.  12/22/14   Melony Overly, MD  oxymetazoline (AFRIN NASAL SPRAY) 0.05 % nasal spray Place 1 spray into both nostrils 3 (three) times daily  as needed for congestion. 04/15/15   Corene Cornea Mesner, MD   BP 123/86 mmHg  Pulse 87  Temp(Src) 98.8 F (37.1 C) (Oral)  Resp 18  SpO2 97%   Physical Exam  Constitutional: He is oriented to person, place, and time. He appears well-developed and well-nourished. No distress.  HENT:  Head: Normocephalic and atraumatic.  Right Ear: Tympanic membrane and ear canal normal.  Left Ear: Tympanic membrane and ear canal normal.  Nose: Right sinus exhibits maxillary sinus tenderness. Left sinus exhibits maxillary sinus tenderness.   Mouth/Throat: Uvula is midline, oropharynx is clear and moist and mucous membranes are normal.  Maxillary sinus tenderness noted  Eyes: Conjunctivae and EOM are normal. Pupils are equal, round, and reactive to light. Scleral icterus (mild) is present.  Neck: Normal range of motion. Neck supple.  Cardiovascular: Normal rate, regular rhythm and normal heart sounds.   Pulmonary/Chest: Effort normal and breath sounds normal. No respiratory distress. He has no wheezes.  Abdominal: Soft. Bowel sounds are normal. There is tenderness in the right upper quadrant and epigastric area. There is no CVA tenderness.  Musculoskeletal: Normal range of motion.  Neurological: He is alert and oriented to person, place, and time.  Skin: Skin is warm and dry. He is not diaphoretic.  Psychiatric: He has a normal mood and affect.  Nursing note and vitals reviewed.   ED Course  Procedures (including critical care time) Labs Review Labs Reviewed  CBC WITH DIFFERENTIAL/PLATELET  COMPREHENSIVE METABOLIC PANEL  LIPASE, BLOOD  URINALYSIS, ROUTINE W REFLEX MICROSCOPIC (NOT AT [facility])    Imaging Review US Abdomen Complete  03/21/2016  CLINICAL DATA:  Epigastric pain, right upper quadrant pain, history of hepatitis-C possible hepatic lesion EXAM: ABDOMEN ULTRASOUND COMPLETE COMPARISON:  Ultrasound 10/06/2014 and CT scan 03/15/2015 FINDINGS: Gallbladder: No gallstones or wall thickening visualized. No sonographic Murphy sign noted by sonographer. Common bile duct: Diameter: 5 mm in diameter within normal limits Liver: No focal lesion identified. Within normal limits in parenchymal echogenicity. IVC: No abnormality visualized. Pancreas: Visualized portion unremarkable. Limited assessment pancreatic head region due to bowel gas. Spleen: Size and appearance within normal limits. Measures 5.7 cm in length. Right Kidney: Length: 10.4 cm. Echogenicity within normal limits. No mass or hydronephrosis visualized. Left Kidney:  Length: 10.8 cm. Echogenicity within normal limits. No mass or hydronephrosis visualized. Abdominal aorta: No aneurysm visualized. Measures up to 2.4 cm in diameter. Other findings: None. IMPRESSION: 1. No gallstones are noted within gallbladder.  Normal CBD. 2. No hepatic lesion is identified by ultrasound. 3. No hydronephrosis. 4. No aortic aneurysm. Electronically Signed   By: Lahoma Crocker M.D.   On: 03/21/2016 09:51   I have personally reviewed and evaluated these images and lab results as part of my medical decision-making.   EKG Interpretation None      MDM   Final diagnoses:  Sinus pressure   60 year old male here with nasal congestion, sinus pressure, and right upper abdominal pain. He is afebrile and nontoxic. He does have some maxillary sinus tenderness noted. This appears to be acute on chronic. Given his prolonged symptoms despite conservative management, will start on course of amoxicillin.  No cough, chest pain, or shortness of breath.  Low suspicion for ACS, PE, dissection, acute pneumonia.  Patient also with right upper abdominal pain. States this started at the time of his congestion. He does have history of hep C and reports he was told he had "spots" on his liver. On chart review patient had a CT  of his chest in 2016 due to opacity found on chest x-ray. He did have a enhancing lesions noted in the right lobe of his liver on CT report, but states he never had follow-up for this. He also has a history of hep C. Lab work was obtained today, liver enzymes are normal. There is no abnormalities noted on his abdominal ultrasound. Will have him follow-up with his primary care doctor for this.  He may need to go back and see his GI doctor again as well.    Discussed plan with patient, he/she acknowledged understanding and agreed with plan of care.  Return precautions given for new or worsening symptoms.  Larene Pickett, PA-C 03/21/16 [place], MD 03/23/16 2154

## 2016-03-27 ENCOUNTER — Encounter (HOSPITAL_COMMUNITY): Payer: Self-pay

## 2016-03-27 ENCOUNTER — Emergency Department (HOSPITAL_COMMUNITY)
Admission: EM | Admit: 2016-03-27 | Discharge: 2016-03-28 | Disposition: A | Discharge status: Home / Self Care | Payer: Medicaid Other | Attending: Emergency Medicine | Admitting: Emergency Medicine

## 2016-03-27 DIAGNOSIS — Y999 Unspecified external cause status: Secondary | ICD-10-CM | POA: Diagnosis not present

## 2016-03-27 DIAGNOSIS — Y939 Activity, unspecified: Secondary | ICD-10-CM | POA: Diagnosis not present

## 2016-03-27 DIAGNOSIS — F17210 Nicotine dependence, cigarettes, uncomplicated: Secondary | ICD-10-CM | POA: Diagnosis not present

## 2016-03-27 DIAGNOSIS — T161XXA Foreign body in right ear, initial encounter: Secondary | ICD-10-CM | POA: Insufficient documentation

## 2016-03-27 DIAGNOSIS — Y929 Unspecified place or not applicable: Secondary | ICD-10-CM | POA: Insufficient documentation

## 2016-03-27 DIAGNOSIS — J449 Chronic obstructive pulmonary disease, unspecified: Secondary | ICD-10-CM | POA: Insufficient documentation

## 2016-03-27 DIAGNOSIS — J0191 Acute recurrent sinusitis, unspecified: Secondary | ICD-10-CM

## 2016-03-27 DIAGNOSIS — X58XXXA Exposure to other specified factors, initial encounter: Secondary | ICD-10-CM | POA: Diagnosis not present

## 2016-03-27 NOTE — ED Triage Notes (Signed)
Pt states recently seen here for sinus issues. States was placed on amoxicillin and montelukast. Pt states since beginning to take medication, worsening symptoms. States lymphatic neck pain. Denies any SOB or dizziness.

## 2016-03-28 MED ORDER — AMOXICILLIN-POT CLAVULANATE 875-125 MG PO TABS: 1.0000 | ORAL_TABLET | Freq: Two times a day (BID) | ORAL | 0 refills | Status: DC

## 2016-03-28 NOTE — Discharge Instructions (Signed)
Medications: Augmentin  Treatment: Take Augmentin twice daily for 10 days. Use over-the-counter nasal rinse such as saline spray or Netipot.  Follow-up: Please follow-up with your primary care provider by the end of the week for reevaluation and recheck of symptoms. Please return to emergency department if you develop any new or worsening symptoms.

## 2016-03-28 NOTE — ED Provider Notes (Signed)
[facility] DEPT Provider Note   CSN: [id number] Arrival date & time: 03/27/16  1949  First Provider Contact:  First MD Initiated Contact with Patient 03/28/16 0055        History   Chief Complaint Chief Complaint  Patient presents with  . Facial Pain    HPI [name] [name] is a 60 y.o. male who presents with persistent sinus pressure and facial pain. Patient was seen on 03/21/16 for ongoing nasal congestion and facial pain. Patient was given amoxicillin in the ED and given montelukast by his PCP 3 days ago. Patient continues to have no relief and feels that his symptoms have been getting worse over the past 3 days. Patient has been having pressure in his ears, face and tenderness to his bilateral anterior neck. Patient has not been taking any other medications besides stated above. Patient denies any fevers, chest pain, shortness of breath, abdominal pain, nausea, vomiting.  HPI  Past Medical History:  Diagnosis Date  . Anxiety   . COPD (chronic obstructive pulmonary disease) ([place])   . Hepatitis   . Hepatitis C    Treatment x 8 weeks 6-7 months ago Harvoni  . Sinus congestion   . Stomach ulcer     Patient Active Problem List   Diagnosis Date Noted  . Chest pain 09/17/2014  . Anxiety   . Dyspnea 03/25/2014  . COPD GOLD II 07/25/2013  . Sinusitis, chronic 07/25/2013  . Smoker 07/25/2013    Past Surgical History:  Procedure Laterality Date  . COLONOSCOPY WITH PROPOFOL N/A 04/14/2015   Procedure: COLONOSCOPY WITH PROPOFOL;  Surgeon: Arta Silence, MD;  Location: [facility];  Service: Endoscopy;  Laterality: N/A;  . HERNIA REPAIR    . STOMACH SURGERY     20 years       Home Medications    Prior to Admission medications   Medication Sig Start Date End Date Taking? Authorizing Provider  amoxicillin (AMOXIL) 500 MG capsule Take 1 capsule (500 mg total) by mouth 3 (three) times daily. 03/21/16   Larene Pickett, PA-C  amoxicillin-clavulanate (AUGMENTIN) 875-125  MG tablet Take 1 tablet by mouth 2 (two) times daily. 03/28/16   Frederica Kuster, PA-C    Family History Family History  Problem Relation Age of Onset  . Hypertension Mother   . Anxiety disorder Mother     Social History Social History  Substance Use Topics  . Smoking status: Current Every Day Smoker    Packs/day: 0.50    Years: 30.00    Types: Cigarettes    Last attempt to quit: 08/04/2013  . Smokeless tobacco: Never Used  . Alcohol use Yes     Allergies   Review of patient's allergies indicates no known allergies.   Review of Systems Review of Systems  Constitutional: Negative for chills and fever.  HENT: Positive for congestion, ear pain and sinus pressure. Negative for facial swelling and sore throat.   Eyes: Positive for pain (pressure).  Respiratory: Negative for shortness of breath.   Cardiovascular: Negative for chest pain.  Gastrointestinal: Negative for abdominal pain, nausea and vomiting.  Genitourinary: Negative for dysuria.  Musculoskeletal: Positive for neck pain (Anterior). Negative for back pain.  Skin: Negative for rash and wound.  Neurological: Negative for headaches (Other than facial pain and pressure).  Psychiatric/Behavioral: The patient is not nervous/anxious.      Physical Exam Updated Vital Signs BP 126/92 (BP Location: Left Arm)   Pulse 67   Temp 97.7 F (36.5 C) (  Oral)   Resp 18   SpO2 100%   Physical Exam  Constitutional: He appears well-developed and well-nourished. No distress.  HENT:  Head: Normocephalic and atraumatic.    Right Ear: Tympanic membrane normal. No drainage or tenderness. A foreign body (Toilet tissue) is present.  Left Ear: Tympanic membrane normal. No foreign bodies.  Mouth/Throat: Oropharynx is clear and moist. No oropharyngeal exudate.  Mild facial tenderness as indicated in image  Eyes: Conjunctivae are normal. Pupils are equal, round, and reactive to light. Right eye exhibits no discharge. Left eye exhibits  no discharge. No scleral icterus.  Neck: Normal range of motion and full passive range of motion without pain. Neck supple. No spinous process tenderness and no muscular tenderness present. No thyromegaly present.    Tenderness along cervical chain, however no palpable lymph nodes  Cardiovascular: Normal rate, regular rhythm and normal heart sounds.  Exam reveals no gallop and no friction rub.   No murmur heard. Pulmonary/Chest: Effort normal and breath sounds normal. No stridor. No respiratory distress. He has no wheezes. He has no rales.  Abdominal: Soft. Bowel sounds are normal. He exhibits no distension. There is no tenderness. There is no rebound and no guarding.  Musculoskeletal: He exhibits no edema.  Lymphadenopathy:    He has no cervical adenopathy.  Neurological: He is alert. Coordination normal.  Skin: Skin is warm and dry. No rash noted. He is not diaphoretic. No pallor.  Psychiatric: He has a normal mood and affect.  Nursing note and vitals reviewed.    ED Treatments / Results  Labs (all labs ordered are listed, but only abnormal results are displayed) Labs Reviewed - No data to display  EKG  EKG Interpretation None       Radiology No results found.  Procedures .Foreign Body Removal Date/Time: 03/28/2016 3:35 PM Performed by: Frederica Kuster Authorized by: Frederica Kuster  Consent: Verbal consent obtained. Consent given by: patient Patient identity confirmed: verbally with patient Body area: ear Location details: right ear  Sedation: Patient sedated: no Patient restrained: no Patient cooperative: yes Localization method: probed and visualized Removal mechanism: curette and irrigation Complexity: simple 1 objects recovered. Objects recovered: Piece of toilet tissue Post-procedure assessment: foreign body removed Patient tolerance: Patient tolerated the procedure well with no immediate complications Comments: I removed some of toilet tissue with  curette and nurse removed the rest successfully by irrigating patient's ear with water   (including critical care time)  Medications Ordered in ED Medications - No data to display   Initial Impression / Assessment and Plan / ED Course  I have reviewed the triage vital signs and the nursing notes.  Pertinent labs & imaging results that were available during my care of the patient were reviewed by me and considered in my medical decision making (see chart for details).  Clinical Course      Final Clinical Impressions(s) / ED Diagnoses   Final diagnoses:  Acute recurrent sinusitis, unspecified location   Patient presenting with apparent antibiotic failure with amoxicillin. I will change antibiotic to amoxicillin clavulanate. I also gave patient follow-up to ENT for his recurrent sinus problems. Patient also to follow up with PCP by the end of the week for follow-up. Patient also advised not to put Q-tips or toilet tissue in his ear or anything else. Patient understands and agrees with plan. Strict return precautions discussed. Patient vitals stable throughout ED course and discharged in satisfactory condition. Patient also evaluated by Dr. Roxanne Mins who guided the  patient's management and is in agreement with plan.   New Prescriptions Discharge Medication List as of 03/28/2016  1:51 AM    START taking these medications   Details  amoxicillin-clavulanate (AUGMENTIN) 875-125 MG tablet Take 1 tablet by mouth 2 (two) times daily., Starting Tue 03/28/2016, Print         Frederica Kuster, PA-C [id number]    Delora Fuel, MD [id number] [id number]

## 2016-04-04 ENCOUNTER — Emergency Department (HOSPITAL_COMMUNITY): Payer: Medicaid Other

## 2016-04-04 ENCOUNTER — Emergency Department (HOSPITAL_COMMUNITY)
Admission: EM | Admit: 2016-04-04 | Discharge: 2016-04-04 | Disposition: A | Discharge status: Home / Self Care | Payer: Medicaid Other | Attending: Emergency Medicine | Admitting: Emergency Medicine

## 2016-04-04 ENCOUNTER — Other Ambulatory Visit: Payer: Self-pay | Admitting: Otolaryngology

## 2016-04-04 ENCOUNTER — Encounter (HOSPITAL_COMMUNITY): Payer: Self-pay | Admitting: Emergency Medicine

## 2016-04-04 DIAGNOSIS — R220 Localized swelling, mass and lump, head: Secondary | ICD-10-CM

## 2016-04-04 DIAGNOSIS — F17210 Nicotine dependence, cigarettes, uncomplicated: Secondary | ICD-10-CM | POA: Insufficient documentation

## 2016-04-04 DIAGNOSIS — J449 Chronic obstructive pulmonary disease, unspecified: Secondary | ICD-10-CM | POA: Insufficient documentation

## 2016-04-04 DIAGNOSIS — C801 Malignant (primary) neoplasm, unspecified: Secondary | ICD-10-CM

## 2016-04-04 DIAGNOSIS — R591 Generalized enlarged lymph nodes: Secondary | ICD-10-CM

## 2016-04-04 DIAGNOSIS — J358 Other chronic diseases of tonsils and adenoids: Secondary | ICD-10-CM

## 2016-04-04 DIAGNOSIS — R590 Localized enlarged lymph nodes: Secondary | ICD-10-CM | POA: Diagnosis not present

## 2016-04-04 HISTORY — DX: Malignant (primary) neoplasm, unspecified: C80.1

## 2016-04-04 LAB — I-STAT CHEM 8, ED
BUN: 14 mg/dL (ref 6–20)
CALCIUM ION: 1.12 mmol/L — AB (ref 1.13–1.30)
CREATININE: 0.7 mg/dL (ref 0.61–1.24)
Chloride: 104 mmol/L (ref 101–111)
Glucose, Bld: 96 mg/dL (ref 65–99)
HEMATOCRIT: 45 % (ref 39.0–52.0)
HEMOGLOBIN: 15.3 g/dL (ref 13.0–17.0)
Potassium: 4 mmol/L (ref 3.5–5.1)
SODIUM: 142 mmol/L (ref 135–145)
TCO2: 24 mmol/L (ref 0–100)

## 2016-04-04 MED ORDER — IOPAMIDOL (ISOVUE-300) INJECTION 61%
INTRAVENOUS | Status: AC
  Administered 2016-04-04: 75 mL
  Filled 2016-04-04: qty 75

## 2016-04-04 NOTE — ED Notes (Signed)
Patient transported to MRI 

## 2016-04-04 NOTE — ED Notes (Signed)
Pt is anxious to know results and is tearful at bedside stating, "I just want to know that I'm not dying". PA informed. Still awaiting 2 consulting providers.

## 2016-04-04 NOTE — ED Triage Notes (Signed)
Pt states he discovered a knot on the left side of his neck in the lymph node area. Pt states it is getting better. Bilateral pain behind his ears that he believes is sinuses. ABT course of medications form previous visit almost complete.

## 2016-04-04 NOTE — ED Provider Notes (Signed)
[facility] DEPT Provider Note   CSN: [id number] Arrival date & time: 04/04/16  [id number]  First Provider Contact:  First MD Initiated Contact with Patient 04/04/16 [phone]        History   Chief Complaint Chief Complaint  Patient presents with  . Mass    HPI [name] [name] is a 60 y.o. male who presents emergency Department with chief complaint of left cervical adenopathy. The patient was seen on 03/27/2016 for complaint of unresolved sinus congestion and pressure. He states that he has almost finished a course of Augmentin and has not had any improvement. Over the last 2 days. He also noticed swelling in the left tonsillar node region, which is extremely large. He denies pain and denies a sore throat but states that it is uncomfortable to swallow against. He is a long-term smoking history and states he quit but "not long ago." Eyes, soaking night sweats, fevers, chills, unexplained weight loss. He is extremely anxious about his node and tearful. He denies other URI symptoms, or history of allergies.  HPI  Past Medical History:  Diagnosis Date  . Anxiety   . COPD (chronic obstructive pulmonary disease) ([place])   . Hepatitis   . Hepatitis C    Treatment x 8 weeks 6-7 months ago Harvoni  . Sinus congestion   . Stomach ulcer     Patient Active Problem List   Diagnosis Date Noted  . Chest pain 09/17/2014  . Anxiety   . Dyspnea 03/25/2014  . COPD GOLD II 07/25/2013  . Sinusitis, chronic 07/25/2013  . Smoker 07/25/2013    Past Surgical History:  Procedure Laterality Date  . COLONOSCOPY WITH PROPOFOL N/A 04/14/2015   Procedure: COLONOSCOPY WITH PROPOFOL;  Surgeon: Arta Silence, MD;  Location: [facility];  Service: Endoscopy;  Laterality: N/A;  . HERNIA REPAIR    . STOMACH SURGERY     20 years       Home Medications    Prior to Admission medications   Medication Sig Start Date End Date Taking? Authorizing Provider  amoxicillin (AMOXIL) 500 MG capsule Take 1 capsule (500  mg total) by mouth 3 (three) times daily. 03/21/16   Larene Pickett, PA-C  amoxicillin-clavulanate (AUGMENTIN) 875-125 MG tablet Take 1 tablet by mouth 2 (two) times daily. 03/28/16   Frederica Kuster, PA-C    Family History Family History  Problem Relation Age of Onset  . Hypertension Mother   . Anxiety disorder Mother     Social History Social History  Substance Use Topics  . Smoking status: Current Every Day Smoker    Packs/day: 0.50    Years: 30.00    Types: Cigarettes    Last attempt to quit: 08/04/2013  . Smokeless tobacco: Never Used  . Alcohol use Yes     Allergies   Review of patient's allergies indicates no known allergies.   Review of Systems Review of Systems   Physical Exam Updated Vital Signs There were no vitals taken for this visit.  Physical Exam  Constitutional: He appears well-developed and well-nourished.  HENT:  Mouth/Throat:    Large Mass on the Left side of the throat in the region of the left tonsil. Appers to be attache to some  Lymphadenopathy:       Head (left side): Tonsillar (firm, nontender, irregularly shaped tonsillar lymph node measuring about 7 cm.) adenopathy present.       Left: Supraclavicular (shotty singular firm node in the lateral portion of the clavicle.) adenopathy present.  Nursing note and vitals reviewed.    ED Treatments / Results  Labs (all labs ordered are listed, but only abnormal results are displayed) Labs Reviewed - No data to display  EKG  EKG Interpretation None       Radiology No results found.  Procedures Procedures (including critical care time)  Medications Ordered in ED Medications - No data to display   Initial Impression / Assessment and Plan / ED Course  I have reviewed the triage vital signs and the nursing notes.  Pertinent labs & imaging results that were available during my care of the patient were reviewed by me and considered in my medical decision making (see chart for  details).  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 Suspicious level 5/supraclavicular nodes on the left.  I have spoken with Dr. Osker Mason  who states that the patient will need follow-up with oncology. After ENT consult and biopsy. He has given the patient's information to their nurse navigator he will schedule his follow-up appointments.   Margarita Mail, PA-C 08/01 1054  DG Chest 2 View (Reviewed) Margarita Mail, PA-C 08/01 1057  DG Chest 2 View (Reviewed) Margarita Mail, PA-C 08/01 1058   Patient's chest x-ray shows a spiculated mass in the right lung apex. Thoracic been seen on previous x-rays and is stable. He may need follow-up chest CT. MRI of the brain is without abnormality. Margarita Mail, PA-C 08/01 1058   I have spoken with Dr. Erik Obey who with see the patient in the office at 1:00 pm/ I have discussed the findings with the patient who is aware and will follow up today at 1:00 pm. The patient is HDS and afebrile. Appropriate for DC at this time. Discussed return precautions Margarita Mail, PA-C 08/01 1130      Final Clinical Impressions(s) / ED Diagnoses   Final diagnoses:  None    New Prescriptions New Prescriptions   No medications on file     Margarita Mail, PA-C 04/04/16 1533    Merryl Hacker, MD 04/04/16 2252

## 2016-04-04 NOTE — ED Notes (Signed)
CT to patient's room to take him for scan, CT advised patient is in MRI at this time.

## 2016-04-10 ENCOUNTER — Telehealth: Payer: Self-pay | Admitting: *Deleted

## 2016-04-10 NOTE — Telephone Encounter (Signed)
  Oncology Nurse Navigator Documentation  Spoke with Mr. [name] [name], introduced myself, noted his recent ED visit. He indicated he had seen Dr. Erik Obey on [phone], had biopsy of his L tonsil, is awaiting further contact from Dr. Noreene Filbert office.  I noted referrals to see physicians at [facility] are likely when the biopsy results are known. I provided him my contact information, indicated I will contact him when referrals are made. He voiced understanding, expressed appreciation for my call.  Gayleen Orem, RN, BSN, [name] at [facility] [phone]   Navigator Location: [facility] (04/10/16 1250) Navigator Encounter Type: Introductory phone call (04/10/16 1250)                                          Time Spent with Patient: 30 (04/10/16 1250)

## 2016-04-12 ENCOUNTER — Other Ambulatory Visit (HOSPITAL_COMMUNITY): Payer: Self-pay | Admitting: Otolaryngology

## 2016-04-12 ENCOUNTER — Telehealth: Payer: Self-pay | Admitting: *Deleted

## 2016-04-12 DIAGNOSIS — D490 Neoplasm of unspecified behavior of digestive system: Secondary | ICD-10-CM

## 2016-04-12 NOTE — Telephone Encounter (Signed)
  Oncology Nurse Navigator Documentation  Received call from [name] [name], addressed his concerns re diagnosis, scheduling of appts.  I explained that Dr. Noreene Filbert office is arranging for PET scan, that when scan is scheduled, I will arrange for him to see physicians at [facility].  He voiced understanding.  Gayleen Orem, RN, BSN, [place] at [facility] [phone]   Navigator Location: [place] [phone]/09/17 1419) Navigator Encounter Type: Telephone (04/12/16 1419) Telephone: Incoming Call (04/12/16 1419)                                        Time Spent with Patient: 15 (04/12/16 1419)

## 2016-04-14 ENCOUNTER — Telehealth: Payer: Self-pay | Admitting: *Deleted

## 2016-04-14 NOTE — Telephone Encounter (Signed)
  Oncology Nurse Navigator Documentation  Spoke with Mr. Mulkins, informed him of 8/23 0730 Nurse Eval and 0800 Dr. Isidore Moos appts.  Explained appt with Dr. Marga Hoots is being arranged, that I will call him with that information.    He confirmed understanding of 8/18 0830 arrival for PET at [facility] Radiology, NPO status after midnight.  Gayleen Orem, RN, BSN, [place] at [facility] [phone]  Navigator Location: [place] [id number]/11/17 1313) Navigator Encounter Type: Telephone (04/14/16 1313) Telephone: Jerilee Hoh Confirmation/Clarification (04/14/16 1313)           Treatment Phase: Pre-Tx/Tx Discussion (04/14/16 1313)                            Time Spent with Patient: 15 (04/14/16 1313)

## 2016-04-19 NOTE — Progress Notes (Signed)
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   Patient presented with symptoms of: On a couple of occasions, he has felt like there was pressure across his mid face on both sides. No nasal congestion or drainage. 3 days ago, while shaving, he noticed a lump in his left neck. No actual pain. No difficulty breathing or swallowing. No change in voice. No prior history of cancer. He has a 40-pack-year smoking history of occasional breaks, and also drank substantially over the years. He has stopped both of these now.   Biopsies revealed:  RECEIVED: 04/05/2016 ORDERING PHYSICIAN:KAROL THADDEUS Erik Obey , MD PATIENT NAME:[name] [name] SURGICAL PATHOLOGY REPORT  FINAL PATHOLOGIC DIAGNOSIS MICROSCOPIC EXAMINATION AND DIAGNOSIS   Left tonsil, biopsy: Invasive squamous cell carcinoma, moderately-differentiated, with basaloid and papillary features.  Nutrition Status Yes No Comments  Weight changes? []  [x]  He tells me he is maintaining his weight, but has noticed a decreased appetite.   Swallowing concerns? []  [x]    PEG? []  [x]     Referrals Yes No Comments  Social Work? []  [x]    Dentistry? []  [x]    Swallowing therapy? []  [x]    Nutrition? []  [x]    Med/Onc? []  [x]  Dr. Alvy Bimler 04/26/16   Safety Issues Yes No Comments  Prior radiation? []  [x]    Pacemaker/ICD? []  [x]    Possible current pregnancy? []  [x]    Is the patient on methotrexate? []  [x]     Tobacco/Marijuana/Snuff/ETOH use: 40 pack history of smoking. He also reports drinking heavily. He tells me he drank mostly beer. He tells me he quit 6 months ago drinking and smoking. He has had periods where he quit for a long time in his lifetime.  Past/Anticipated interventions by otolaryngology, if any:  Dr. Erik Obey [id number] Oral Biopsy Diagnosis: Left tonsil neoplasm Biopsy Site: left tonsil    Past/Anticipated  interventions by medical oncology, if any: He has an appointment with Dr. Lurlean Leyden today at 9:30     Current Complaints / other details:   PET 04/21/16 IMPRESSION: Asymmetric hypermetabolic soft tissue prominence in left palatine tonsil, consistent with known primary left tonsillar squamous cell carcinoma.  Left level 2 cervical lymphadenopathy, consistent with metastatic disease.  No evidence of metastatic disease within the chest, abdomen, or pelvis.  Stable 14 mm ground-glass and part solid nodular opacity in right lung apex compared to previous CT in 2016. This shows low-grade metabolic activity. Low-grade bronchogenic adenocarcinoma cannot be excluded. Consider surgical resection versus continued followup by CT in 12 months. This recommendation follows the consensus statement: Guidelines for Management of Small Pulmonary Nodules Detected on CT Images: From the Fleischner Society 2017; published online before print (10.1148/radiol.IJ:2314499).  He tells me he has occasional pain across his chest that comes and goes. It goes across his shoulders and back.  He will take ibuprofen which he tells me helps him a "little bit"  BP (!) 136/104   Pulse 68   Temp 98.4 F (36.9 C)   Ht 6\' 1"  (1.854 m)   Wt 162 lb 6.4 oz (73.7 kg)   SpO2 100% Comment: room air  BMI 21.43 kg/m    Wt Readings from Last 3 Encounters:  04/26/16 162 lb 6.4 oz (73.7 kg)  11/18/15 154 lb (69.9 kg)  08/31/15 153 lb 2 oz (69.5 kg)

## 2016-04-21 ENCOUNTER — Ambulatory Visit (HOSPITAL_COMMUNITY)
Admission: RE | Admit: 2016-04-21 | Discharge: 2016-04-21 | Disposition: A | Discharge status: Home / Self Care | Payer: Medicaid Other | Source: Ambulatory Visit | Attending: Otolaryngology | Admitting: Otolaryngology

## 2016-04-21 DIAGNOSIS — D3705 Neoplasm of uncertain behavior of pharynx: Secondary | ICD-10-CM | POA: Insufficient documentation

## 2016-04-21 DIAGNOSIS — R918 Other nonspecific abnormal finding of lung field: Secondary | ICD-10-CM | POA: Insufficient documentation

## 2016-04-21 DIAGNOSIS — D490 Neoplasm of unspecified behavior of digestive system: Secondary | ICD-10-CM

## 2016-04-21 DIAGNOSIS — R590 Localized enlarged lymph nodes: Secondary | ICD-10-CM | POA: Insufficient documentation

## 2016-04-21 LAB — GLUCOSE, CAPILLARY: Glucose-Capillary: 103 mg/dL — ABNORMAL HIGH (ref 65–99)

## 2016-04-21 MED ORDER — FLUDEOXYGLUCOSE F - 18 (FDG) INJECTION: 7.7300 | Freq: Once | INTRAVENOUS | Status: DC | PRN

## 2016-04-24 ENCOUNTER — Encounter: Payer: Self-pay | Admitting: Hematology and Oncology

## 2016-04-24 DIAGNOSIS — Z8619 Personal history of other infectious and parasitic diseases: Secondary | ICD-10-CM | POA: Insufficient documentation

## 2016-04-24 DIAGNOSIS — C090 Malignant neoplasm of tonsillar fossa: Secondary | ICD-10-CM | POA: Insufficient documentation

## 2016-04-25 ENCOUNTER — Telehealth: Payer: Self-pay | Admitting: *Deleted

## 2016-04-25 NOTE — Telephone Encounter (Signed)
  Oncology Nurse Navigator Documentation  Called Mr. Hoar, confirmed his understanding of appointments tomorrow morning with Drs. Isidore Moos and Ada, arrival and registration procedures.  Gayleen Orem, RN, BSN, [place] at [facility] [phone]   Navigator Location: [place] [phone]/22/17 1435) Navigator Encounter Type: Telephone (04/25/16 1435) Telephone: Lahoma Crocker Call;Appt Confirmation/Clarification (04/25/16 1435)                                        Time Spent with Patient: 15 (04/25/16 1435)

## 2016-04-26 ENCOUNTER — Encounter: Payer: Self-pay | Admitting: Hematology and Oncology

## 2016-04-26 ENCOUNTER — Ambulatory Visit
Admission: RE | Admit: 2016-04-26 | Discharge: 2016-04-26 | Disposition: A | Discharge status: Home / Self Care | Payer: Medicaid Other | Source: Ambulatory Visit | Attending: Radiation Oncology | Admitting: Radiation Oncology

## 2016-04-26 ENCOUNTER — Encounter: Payer: Self-pay | Admitting: Radiation Oncology

## 2016-04-26 ENCOUNTER — Telehealth: Payer: Self-pay | Admitting: *Deleted

## 2016-04-26 ENCOUNTER — Encounter: Payer: Self-pay | Admitting: *Deleted

## 2016-04-26 ENCOUNTER — Ambulatory Visit (HOSPITAL_BASED_OUTPATIENT_CLINIC_OR_DEPARTMENT_OTHER): Payer: Self-pay | Admitting: Hematology and Oncology

## 2016-04-26 VITALS — BP 136/104 | HR 68 | Temp 98.4°F | Ht 73.0 in | Wt 162.4 lb

## 2016-04-26 DIAGNOSIS — J449 Chronic obstructive pulmonary disease, unspecified: Secondary | ICD-10-CM | POA: Diagnosis not present

## 2016-04-26 DIAGNOSIS — Z510 Encounter for antineoplastic radiation therapy: Secondary | ICD-10-CM | POA: Insufficient documentation

## 2016-04-26 DIAGNOSIS — J984 Other disorders of lung: Secondary | ICD-10-CM

## 2016-04-26 DIAGNOSIS — R911 Solitary pulmonary nodule: Secondary | ICD-10-CM

## 2016-04-26 DIAGNOSIS — Z87891 Personal history of nicotine dependence: Secondary | ICD-10-CM | POA: Diagnosis not present

## 2016-04-26 DIAGNOSIS — C090 Malignant neoplasm of tonsillar fossa: Secondary | ICD-10-CM

## 2016-04-26 DIAGNOSIS — C099 Malignant neoplasm of tonsil, unspecified: Secondary | ICD-10-CM

## 2016-04-26 DIAGNOSIS — Z8249 Family history of ischemic heart disease and other diseases of the circulatory system: Secondary | ICD-10-CM | POA: Diagnosis not present

## 2016-04-26 DIAGNOSIS — F419 Anxiety disorder, unspecified: Secondary | ICD-10-CM | POA: Diagnosis not present

## 2016-04-26 DIAGNOSIS — R5381 Other malaise: Secondary | ICD-10-CM

## 2016-04-26 NOTE — Addendum Note (Signed)
Encounter addended by: Ernst Spell, RN on: 04/26/2016  9:15 AM<BR>    Actions taken: Charge Capture section accepted

## 2016-04-26 NOTE — Progress Notes (Signed)
  Oncology Nurse Navigator Documentation  Met with [name] [name] during initial consult with Dr. Alvy Bimler following his just completed consult with Dr. Isidore Moos. 1. Provided and discussed educational handout for PAC. 2. He voiced understanding of Dr. Calton Dach recommendation for Cisplatin chemotherapy, that I will be contacting [facility] ENT Audiology to facilitate baseline hearing test. 3. Showed him location of Dr. Ritta Slot office.  While there he received an 0800 appt for tomorrow.  He understands to come directly to Dental Medicine for appointments.   4. They verbalized understanding of information provided. He understands to call me with questions/concerns.  Gayleen Orem, RN, BSN, [place] at [facility] [phone]   Navigator Location: [facility]Med Onc (04/26/16 0900) Navigator Encounter Type: Initial MedOnc (04/26/16 0900)             Treatment Phase: Pre-Tx/Tx Discussion (04/26/16 0900)                            Time Spent with Patient: 45 (04/26/16 0900)

## 2016-04-26 NOTE — Progress Notes (Signed)
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 male who presented with presented to the ED on 03/21/16 for sinusitis and right upper abdominal pain. He was given amoxicillin during his encounter. He presented back to the ED on 03/28/16 for no relief of the sinusitis and now with pain going to his ears. He was switched from amoxicillin to amoxicillin-clavulanic acid. He presented back to the ED on 04/04/16 for the unresolved sinusitis and now with noticeable swelling in the left tonsillar node region, a palpable node in the left supraclavicular region, and some difficulty swallowing. During that encounter, CT of the soft tissue of the neck with contrast revealed a 1.9 x 2.4 cm left tonsillar mass, a level 2 nodal mass on the left measuring 3.3 x 1.9 x 2.1 cm, and enlarged supraclavicular/level 5 nodes on the left measuring up to 1.1 cm. MRI of the brain neuro protocol revealed no intracranial findings.  On 04/05/16, biopsy of the left tonsil, by Dr. Erik Obey, revealed invasive squamous cell carcinoma, moderately-differentiated, with basaloid and papillary features. I've asked Gayleen Orem, RN, our Head and Neck Oncology Navigator to see if p16 testing is possible.  Pertinent imaging thus far includes a PET scan performed on 04/21/16 revealing an hypermetabolic asymmetrical soft tissue prominence in the left palatine tonsil with SUV max of 29.9, left level 2 lymphadenopathy measuring 1.5 cm with SUV of 22.9, and a stable ground-glass and part solid nodular opacity in  the right lung apex measuring 1.4 cm with SUV max of 1.5.  The patient presents today to discuss the role of radiation in the management of his disease. Also present during this encounter was Gayleen Orem, RN, our Head and Neck Oncology Navigator.  Swallowing issues, if any: He denies swallowing difficulties at this time.  Weight Changes: He did not notice any weight loss (usually weights 158-160 lbs), but does report some decrease in appetite.  Pain status: He has some right upper back and b/l shoulder pain.  Other symptoms: Lymphadenopathy in his left neck. He reports having an abdominal hernia, but this was not noted in the PET scan.  Tobacco history, if any: 15 pack year history (with about 0.5 packs/day for over 30 years. Quit about 3-6 months ago. Never used smokeless tobacco.  ETOH abuse, if any: He reports being a heavy drinker in the past. He has quit about 6 months ago.  Prior cancers, if any: N/A  PREVIOUS RADIATION THERAPY: No  PAST MEDICAL HISTORY:  has a past medical history of Anxiety; COPD (chronic obstructive pulmonary disease) ([place]); Hepatitis; Hepatitis C; Sinus congestion; and Stomach ulcer.    PAST SURGICAL HISTORY: Past Surgical History:  Procedure Laterality Date  . COLONOSCOPY WITH PROPOFOL N/A 04/14/2015   Procedure: COLONOSCOPY WITH PROPOFOL;  Surgeon: Arta Silence, MD;  Location: [facility] ENDOSCOPY;  Service: Endoscopy;  Laterality: N/A;  . HERNIA REPAIR    . STOMACH SURGERY     20 years    FAMILY HISTORY: family history includes Anxiety disorder in his mother; Hypertension in his mother.  SOCIAL HISTORY:  reports that he  quit smoking about 3 months ago. His smoking use included Cigarettes. He has a 15.00 pack-year smoking history. He has never used smokeless tobacco. He reports that he does not drink alcohol or use drugs.  ALLERGIES: Review of patient's allergies indicates no known allergies.  MEDICATIONS:  Current Outpatient Prescriptions  Medication Sig  Dispense Refill  . ibuprofen (ADVIL,MOTRIN) 200 MG tablet Take 200 mg by mouth every 6 (six) hours as needed.     No current facility-administered medications for this encounter.    Facility-Administered Medications Ordered in Other Encounters  Medication Dose Route Frequency Provider Last Rate Last Dose  . fludeoxyglucose F - 18 (FDG) injection Q000111Q millicurie  Q000111Q millicurie Intravenous Once PRN David A Martinique, MD        REVIEW OF SYSTEMS:  Notable for that above.   PHYSICAL EXAM:  height is 6\' 1"  (1.854 m) and weight is 162 lb 6.4 oz (73.7 kg). His temperature is 98.4 F (36.9 C). His blood pressure is 136/104 (abnormal) and his pulse is 68. His oxygen saturation is 100%.   General: Alert and oriented, in no acute distress HEENT: Head is normocephalic. Extraocular movements are intact. Oropharynx is notable for some missing teeth. In the left tonsillar region there is a submucosal tumor measuringabout 2.5 cm in greatest dimension. Neck: In the left upper neck, there is a bilobed mass that I would estimate to be about 3.5 cm in greatest total dimension. No other masses appreciated in the neck. Heart: Regular in rate and rhythm with no murmurs, rubs, or gallops. Chest: Clear to auscultation bilaterally, with no rhonchi, wheezes, or rales. Abdomen: Soft, nontender, nondistended, with no rigidity or guarding. Extremities: No cyanosis or edema. Lymphatics: see Neck Exam Skin: No concerning lesions. Musculoskeletal: symmetric strength and muscle tone throughout. Neurologic: Cranial nerves II through XII are grossly intact. No obvious focalities. Speech is fluent. Coordination is intact. Psychiatric: Judgment and insight are intact. Affect is appropriate.   ECOG = 1  0 - Asymptomatic (Fully active, able to carry on all predisease activities without restriction)  1 - Symptomatic but completely ambulatory (Restricted in physically strenuous activity but ambulatory and able to carry out  work of a light or sedentary nature. For example, light housework, office work)  2 - Symptomatic, <50% in bed during the day (Ambulatory and capable of all self care but unable to carry out any work activities. Up and about more than 50% of waking hours)  3 - Symptomatic, >50% in bed, but not bedbound (Capable of only limited self-care, confined to bed or chair 50% or more of waking hours)  4 - Bedbound (Completely disabled. Cannot carry on any self-care. Totally confined to bed or chair)  5 - Death   Eustace Pen MM, Creech RH, Tormey DC, et al. [phone]). "Toxicity and response criteria of the [facility]". [place] Oncol. 5 (6): 649-55   LABORATORY DATA:  Lab Results  Component Value Date   WBC 8.6 03/21/2016   HGB 15.3 04/04/2016   HCT 45.0 04/04/2016   MCV 93.3 03/21/2016   PLT 245 03/21/2016   CMP     Component Value Date/Time   NA 142 04/04/2016 0655   K 4.0 04/04/2016 0655   CL 104 04/04/2016 0655   CO2 27 03/21/2016 0828   GLUCOSE 96 04/04/2016 0655   BUN 14 04/04/2016 0655   CREATININE 0.70 04/04/2016 0655   CALCIUM 9.9 03/21/2016 0828   PROT 7.2 03/21/2016 0828   ALBUMIN 4.2  03/21/2016 0828   AST 25 03/21/2016 0828   ALT 18 03/21/2016 0828   ALKPHOS 47 03/21/2016 0828   BILITOT 0.7 03/21/2016 0828   GFRNONAA >60 03/21/2016 0828   GFRAA >60 03/21/2016 0828         RADIOGRAPHY: Dg Chest 2 View  Result Date: 04/04/2016 CLINICAL DATA:  Left-sided pharyngeal mass with lymphadenopathy EXAM: CHEST  2 VIEW COMPARISON:  Chest radiograph November 18, 2015 and chest CT October 06, 2014 FINDINGS: There is a small area of spiculation in the right apex measuring approximately 1 cm in size which is stable compared to prior studies. There is no edema or consolidation. Heart size and pulmonary vascularity are normal. No adenopathy. There is an old healed fracture of the posterior left sixth rib. No blastic or lytic bone lesions are evident. IMPRESSION: Small area  spiculation in the right apex remains stable. No new opacity. No edema or consolidation. Stable cardiac silhouette. No adenopathy evident. Electronically Signed   By: Lowella Grip III M.D.   On: 04/04/2016 07:04   Ct Soft Tissue Neck W Contrast  Result Date: 04/04/2016 CLINICAL DATA:  Swelling of the face and neck over the last month. Focal prominence of the left side of the neck. EXAM: CT NECK WITH CONTRAST TECHNIQUE: Multidetector CT imaging of the neck was performed using the standard protocol following the bolus administration of intravenous contrast. CONTRAST:  6mL ISOVUE-300 IOPAMIDOL (ISOVUE-300) INJECTION 61% COMPARISON:  None. FINDINGS: Pharynx and larynx: There is a 1.9 x 2.4 cm left tonsillar mass. No other mucosal or submucosal lesion is seen. Salivary glands: Submandibular and parotid glands are normal. Thyroid: Normal Lymph nodes: Level 2 nodal mass on the left measuring 3.3 by 1.9 by 2.1 cm consistent with metastatic disease. No abnormal nodes by size criteria and level 3 or level 4. Enlarged supraclavicular/ level 5 nodes on the left measuring up to short axis dimension 11 mm. Vascular: Normal Limited intracranial: Normal Visualized orbits: Normal Mastoids and visualized paranasal sinuses: Clear Skeleton: Ordinary cervical spondylosis Upper chest: Focal density at the right lung apex consistent with scarring, but with apparent growth in that region since 8 chest CT dated 10/06/2014. Dominant nodular focus measures 13 mm, but appears to contain more tissue volume. Complete chest CT evaluation suggested. This may require further evaluation with PET. IMPRESSION: Left tonsillar mass with diameter of 19 x 24 mm consistent with 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 necrosis. Suspicious level 5/supraclavicular nodes on the left. Scar type density in the right upper lobe is more prominent than was seen on a CT scan of the chest 10/06/2014. Therefore this is viewed with  some suspicion and malignancy in this location is possible. Complete chest CT and possible PET for further evaluation. Electronically Signed   By: Nelson Chimes M.D.   On: 04/04/2016 09:27   Mr Brain Wo Contrast (neuro Protocol)  Result Date: 04/04/2016 CLINICAL DATA:  Swelling of the left neck and dizziness, several days duration. EXAM: MRI HEAD WITHOUT CONTRAST TECHNIQUE: Multiplanar, multiecho pulse sequences of the brain and surrounding structures were obtained without intravenous contrast. COMPARISON:  CT neck same day.  Previous head MRI 05/27/2015 FINDINGS: Diffusion imaging does not show any acute or subacute infarction. Restricted diffusion lesion is seen in the left tonsil, further described on the neck CT. Brainstem and cerebellum are normal. Cerebral hemispheres show changes of chronic small vessel disease within the white matter, similar to the previous study. No large vessel territory infarction.  No mass lesion, hemorrhage, hydrocephalus or extra-axial collection. No pituitary mass. No inflammatory sinus disease. No skull or skullbase lesion. IMPRESSION: No acute intracranial finding. Chronic small-vessel ischemic changes of the cerebral hemispheric white matter. Restricted diffusion left tonsillar mass, completely described at neck CT. Electronically Signed   By: Nelson Chimes M.D.   On: 04/04/2016 09:34   Nm Pet Image Initial (pi) Skull Base To Thigh  Result Date: 04/21/2016 CLINICAL DATA:  Initial treatment strategy for left tonsillar squamous cell carcinoma. EXAM: NUCLEAR MEDICINE PET SKULL BASE TO THIGH TECHNIQUE: 7.7 mCi F-18 FDG was injected intravenously. Full-ring PET imaging was performed from the skull base to thigh after the radiotracer. CT data was obtained and used for attenuation correction and anatomic localization. FASTING BLOOD GLUCOSE:  Value: 103 mg/dl COMPARISON:  Chest CT on 10/06/2014 FINDINGS: NECK Asymmetric soft tissue prominence in the left palatine tonsil shows hyper  metabolism, with SUV max of 29.9. This is consistent with known primary left tonsillar squamous cell carcinoma. Left level 2 lymphadenopathy is seen measuring 1.5 cm on image 25/4, which is hypermetabolic with SUV max of [id number]. This is consistent with lymph node metastasis. No other hypermetabolic cervical lymphadenopathy identified. CHEST No hypermetabolic lymph nodes within the thorax. Ground-glass and part solid nodular opacity in the right lung apex measures 14 mm on image 8/series 8. This is unchanged compared to previous CT on 10/06/2014, however this shows mild hypermetabolic activity with SUV max of 1.5. No other pulmonary nodules or masses identified. No evidence of pleural effusion. ABDOMEN/PELVIS No abnormal hypermetabolic activity within the liver, pancreas, adrenal glands, or spleen. No hypermetabolic lymph nodes in the abdomen or pelvis. SKELETON No focal hypermetabolic activity to suggest skeletal metastasis. IMPRESSION: Asymmetric hypermetabolic soft tissue prominence in left palatine tonsil, consistent with known primary left tonsillar squamous cell carcinoma. Left level 2 cervical lymphadenopathy, consistent with metastatic disease. No evidence of metastatic disease within the chest, abdomen, or pelvis. Stable 14 mm ground-glass and part solid nodular opacity in right lung apex compared to previous CT in 2016. This shows low-grade metabolic activity. Low-grade bronchogenic adenocarcinoma cannot be excluded. Consider surgical resection versus continued followup by CT in 12 months. This recommendation follows the consensus statement: Guidelines for Management of Small Pulmonary Nodules Detected on CT Images: From the Fleischner Society 2017; published online before print (10.1148/radiol.SG:5268862). Electronically Signed   By: Earle Gell M.D.   On: 04/21/2016 12:27      IMPRESSION/PLAN:  This is a delightful patient with L tonsil head and neck cancer. I do recommend radiotherapy for this  patient.  I spoke to the patient about the role of surgery for his disease. I told him that if he had surgery, he would likely still require adjuvant radiation. Although I offered referal to [facility] for an opinion regarding TORS surgery, I think chemoradiotherapy would still likely be his best option. He would like to proceed with chemoradiotherapy here.  We discussed the potential risks, benefits, and side effects of radiotherapy. We talked in detail about acute and late effects. We discussed that some of the most bothersome acute effects may be mucositis, dysgeusia, salivary changes, skin irritation, hair loss, dehydration, weight loss and fatigue. We talked about late effects which include but are not necessarily limited to dysphagia, hypothyroidism, nerve injury, spinal cord injury, xerostomia, trismus, and neck edema. No guarantees of treatment were given. A consent form was signed and placed in the patient's medical record. The patient is enthusiastic about proceeding with treatment. I  look forward to participating in the patient's care.  Simulation (treatment planning) will take place after clearance from dentistry.  We also discussed that the treatment of head and neck cancer is a multidisciplinary process to maximize treatment outcomes and quality of life. For this reasons the following referrals have been or will be made:  Medical oncology to discuss chemotherapy. (He has an appointment with Dr. Alvy Bimler today at 9:30AM.)  Dentistry for dental evaluation, possible extractions in the radiation fields, and /or advice on reducing risk of cavities, osteoradionecrosis, or other oral issues.  Nutritionist for nutrition support during and after treatment. (Prophylactic PEG tube should be placed - defer to med/onc to order.)  Speech language pathology for swallowing and/or speech therapy.  Social work for social support. (The patient uses the bus for transportation. He would also need support  to not relapse towards smoking and drinking again.)  Physical therapy due to risk of lymphedema in neck and deconditioning. (Could also help with his right upper back and right should pain.)  Baseline labs including TSH.  Lung lesion appears relatively indolent - recommend C-T surgery referral after restaging scans, post definitive treatment for tonsil cancer. __________________________________________   Eppie Gibson, MD  This document serves as a record of services personally performed by Eppie Gibson, MD. It was created on her behalf by Darcus Austin, a trained medical scribe. The creation of this record is based on the scribe's personal observations and the provider's statements to them. This document has been checked and approved by the attending provider.

## 2016-04-26 NOTE — Assessment & Plan Note (Signed)
He is not symptomatic. Recommend close observation. The patient has quit smoking and I congratulated his effort

## 2016-04-26 NOTE — Progress Notes (Signed)
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 male is here because of newly diagnosed left tonsil cancer According to the patient, the first initial presentation was due to palpable lump on the left side of his neck. I review his records extensively and summarized as follows:   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[place])   04/04/2016 Initial Diagnosis    He saw ENT and had laryngoscopy and biopsy of left tonsil      04/04/2016 Pathology Results    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 Chronic small-vessel ischemic changes of the cerebral hemispheric white matter.       04/04/2016 Imaging    CT neck showed left tonsillar mass with diameter of 19 x 24 mm consistent with 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 necrosis. Suspicious level 5/supraclavicular nodes on the left. Scar type density in the right upper lobe is more prominent than was seen on a CT scan of the chest 10/06/2014      04/21/2016 PET scan    Asymmetric hypermetabolic soft tissue prominence in left palatine tonsil, consistent with known primary left tonsillar squamous cell Carcinoma. Left level 2 cervical lymphadenopathy, consistent with metastatic disease. No evidence of metastatic disease within the chest, abdomen, or pelvis. Stable 14 mm ground-glass and part solid nodular opacity in right lung apex compared to previous CT in 2016. This shows low-grade metabolic activity. Low-grade bronchogenic adenocarcinoma cannot be excluded. Consider surgical resection versus continued followup by CT in 12 months       he denies any hearing deficit,  difficulties with chewing food, swallowing difficulties, painful swallowing, changes in the quality of his voice or abnormal weight loss. The patient had poor social circumstances. He has a disabled son who lives with his ex-girlfriend's family. 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 The patient does not have reliable transportation. His risk factors included strong smoking history and prior alcohol intake. The patient had been abstinent from both in the last few months  MEDICAL HISTORY:  Past Medical History:  Diagnosis Date  . Anxiety   . COPD (chronic obstructive pulmonary disease) ([place])   . Hepatitis   . Hepatitis C    Treatment x 8 weeks 6-7 months ago [facility]  . Sinus congestion   . Stomach ulcer     SURGICAL HISTORY: Past Surgical History:  Procedure Laterality Date  . COLONOSCOPY WITH PROPOFOL N/A 04/14/2015   Procedure: COLONOSCOPY WITH PROPOFOL;  Surgeon: Arta Silence, MD;  Location: [facility];  Service: Endoscopy;  Laterality: N/A;  . HERNIA REPAIR    . STOMACH SURGERY     20 years    SOCIAL HISTORY: Social History   Social History  . Marital status: Single    Spouse name: N/A  . Number of children: 3  . Years of education: N/A   Occupational History  . disabled    Social History Main Topics  . Smoking status: Former Smoker    Packs/day: 0.50    Years: 30.00    Types: Cigarettes    Quit date: 01/03/2016  . Smokeless tobacco: Never Used  . Alcohol use  No     Comment: quit about 3 months ago, drank beer (he tells me a lot of beer)  . Drug use: No  . Sexual activity: Yes   Other Topics Concern  . Not on file   Social History Narrative   Lives with mother.    Mother is disabled with mental illness. He is the principal care giver   Has 3 children.   [name] is an important contact   He does not drive. Takes a bus. Ride a bike.    FAMILY HISTORY: Family History  Problem Relation Age of Onset  .  Hypertension Mother   . Anxiety disorder Mother     ALLERGIES:  has No Known Allergies.  MEDICATIONS:  Current Outpatient Prescriptions  Medication Sig Dispense Refill  . ibuprofen (ADVIL,MOTRIN) 200 MG tablet Take 200 mg by mouth every 6 (six) hours as needed.     No current facility-administered medications for this visit.    Facility-Administered Medications Ordered in Other Visits  Medication Dose Route Frequency Provider Last Rate Last Dose  . fludeoxyglucose F - 18 (FDG) injection Q000111Q millicurie  Q000111Q millicurie Intravenous Once PRN David A Martinique, MD        REVIEW OF SYSTEMS:   Constitutional: Denies fevers, chills or abnormal night sweats Eyes: Denies blurriness of vision, double vision or watery eyes Ears, nose, mouth, throat, and face: Denies mucositis or sore throat Respiratory: Denies cough, dyspnea or wheezes Cardiovascular: Denies palpitation, chest discomfort or lower extremity swelling Gastrointestinal:  Denies nausea, heartburn or change in bowel habits Skin: Denies abnormal skin rashes Neurological:Denies numbness, tingling or new weaknesses Behavioral/Psych: Mood is stable, no new changes  All other systems were reviewed with the patient and are negative.  PHYSICAL EXAMINATION: ECOG PERFORMANCE STATUS: 1 - Symptomatic but completely ambulatory  Vitals:   04/26/16 0909  BP: 132/87  Pulse: 60  Resp: 18  Temp: 98.2 F (36.8 C)   Filed Weights   04/26/16 0909  Weight: 162 lb 8 oz (73.7 kg)    GENERAL:alert, no distress and comfortable SKIN: skin color, texture, turgor are normal, no rashes or significant lesions EYES: normal, conjunctiva are pink and non-injected, sclera clear OROPHARYNX:He has large tonsillar mass on the left side NECK: supple, thyroid normal size, non-tender, without nodularity LYMPH:  He has palpable regional lymphadenopathy in the left neck LUNGS: clear to auscultation and percussion with normal breathing effort HEART: regular  rate & rhythm and no murmurs and no lower extremity edema ABDOMEN:abdomen soft, non-tender and normal bowel sounds Musculoskeletal:no cyanosis of digits and no clubbing  PSYCH: alert & oriented x 3 with fluent speech NEURO: no focal motor/sensory deficits  LABORATORY DATA:  I have reviewed the data as listed Lab Results  Component Value Date   WBC 8.6 03/21/2016   HGB 15.3 04/04/2016   HCT 45.0 04/04/2016   MCV 93.3 03/21/2016   PLT 245 03/21/2016   Lab Results  Component Value Date   NA 142 04/04/2016   K 4.0 04/04/2016   CL 104 04/04/2016   CO2 27 03/21/2016    RADIOGRAPHIC STUDIES: I have personally reviewed the radiological images as listed and agreed with the findings in the report. Dg Chest 2 View  Result Date: 04/04/2016 CLINICAL DATA:  Left-sided pharyngeal mass with lymphadenopathy EXAM: CHEST  2 VIEW COMPARISON:  Chest radiograph November 18, 2015 and chest CT October 06, 2014 FINDINGS: There is a small area of spiculation in the right apex measuring approximately 1 cm  in size which is stable compared to prior studies. There is no edema or consolidation. Heart size and pulmonary vascularity are normal. No adenopathy. There is an old healed fracture of the posterior left sixth rib. No blastic or lytic bone lesions are evident. IMPRESSION: Small area spiculation in the right apex remains stable. No new opacity. No edema or consolidation. Stable cardiac silhouette. No adenopathy evident. Electronically Signed   By: Lowella Grip III M.D.   On: 04/04/2016 07:04   Ct Soft Tissue Neck W Contrast  Result Date: 04/04/2016 CLINICAL DATA:  Swelling of the face and neck over the last month. Focal prominence of the left side of the neck. EXAM: CT NECK WITH CONTRAST TECHNIQUE: Multidetector CT imaging of the neck was performed using the standard protocol following the bolus administration of intravenous contrast. CONTRAST:  58mL ISOVUE-300 IOPAMIDOL (ISOVUE-300) INJECTION 61% COMPARISON:   None. FINDINGS: Pharynx and larynx: There is a 1.9 x 2.4 cm left tonsillar mass. No other mucosal or submucosal lesion is seen. Salivary glands: Submandibular and parotid glands are normal. Thyroid: Normal Lymph nodes: Level 2 nodal mass on the left measuring 3.3 by 1.9 by 2.1 cm consistent with metastatic disease. No abnormal nodes by size criteria and level 3 or level 4. Enlarged supraclavicular/ level 5 nodes on the left measuring up to short axis dimension 11 mm. Vascular: Normal Limited intracranial: Normal Visualized orbits: Normal Mastoids and visualized paranasal sinuses: Clear Skeleton: Ordinary cervical spondylosis Upper chest: Focal density at the right lung apex consistent with scarring, but with apparent growth in that region since 8 chest CT dated 10/06/2014. Dominant nodular focus measures 13 mm, but appears to contain more tissue volume. Complete chest CT evaluation suggested. This may require further evaluation with PET. IMPRESSION: Left tonsillar mass with diameter of 19 x 24 mm consistent with 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 necrosis. Suspicious level 5/supraclavicular nodes on the left. Scar type density in the right upper lobe is more prominent than was seen on a CT scan of the chest 10/06/2014. Therefore this is viewed with some suspicion and malignancy in this location is possible. Complete chest CT and possible PET for further evaluation. Electronically Signed   By: Nelson Chimes M.D.   On: 04/04/2016 09:27   Mr Brain Wo Contrast (neuro Protocol)  Result Date: 04/04/2016 CLINICAL DATA:  Swelling of the left neck and dizziness, several days duration. EXAM: MRI HEAD WITHOUT CONTRAST TECHNIQUE: Multiplanar, multiecho pulse sequences of the brain and surrounding structures were obtained without intravenous contrast. COMPARISON:  CT neck same day.  Previous head MRI 05/27/2015 FINDINGS: Diffusion imaging does not show any acute or subacute infarction. Restricted  diffusion lesion is seen in the left tonsil, further described on the neck CT. Brainstem and cerebellum are normal. Cerebral hemispheres show changes of chronic small vessel disease within the white matter, similar to the previous study. No large vessel territory infarction. No mass lesion, hemorrhage, hydrocephalus or extra-axial collection. No pituitary mass. No inflammatory sinus disease. No skull or skullbase lesion. IMPRESSION: No acute intracranial finding. Chronic small-vessel ischemic changes of the cerebral hemispheric white matter. Restricted diffusion left tonsillar mass, completely described at neck CT. Electronically Signed   By: Nelson Chimes M.D.   On: 04/04/2016 09:34   Nm Pet Image Initial (pi) Skull Base To Thigh  Result Date: 04/21/2016 CLINICAL DATA:  Initial treatment strategy for left tonsillar squamous cell carcinoma. EXAM: NUCLEAR MEDICINE PET SKULL BASE TO THIGH TECHNIQUE: 7.7 mCi  F-18 FDG was injected intravenously. Full-ring PET imaging was performed from the skull base to thigh after the radiotracer. CT data was obtained and used for attenuation correction and anatomic localization. FASTING BLOOD GLUCOSE:  Value: 103 mg/dl COMPARISON:  Chest CT on 10/06/2014 FINDINGS: NECK Asymmetric soft tissue prominence in the left palatine tonsil shows hyper metabolism, with SUV max of 29.9. This is consistent with known primary left tonsillar squamous cell carcinoma. Left level 2 lymphadenopathy is seen measuring 1.5 cm on image 25/4, which is hypermetabolic with SUV max of [id number]. This is consistent with lymph node metastasis. No other hypermetabolic cervical lymphadenopathy identified. CHEST No hypermetabolic lymph nodes within the thorax. Ground-glass and part solid nodular opacity in the right lung apex measures 14 mm on image 8/series 8. This is unchanged compared to previous CT on 10/06/2014, however this shows mild hypermetabolic activity with SUV max of 1.5. No other pulmonary nodules or  masses identified. No evidence of pleural effusion. ABDOMEN/PELVIS No abnormal hypermetabolic activity within the liver, pancreas, adrenal glands, or spleen. No hypermetabolic lymph nodes in the abdomen or pelvis. SKELETON No focal hypermetabolic activity to suggest skeletal metastasis. IMPRESSION: Asymmetric hypermetabolic soft tissue prominence in left palatine tonsil, consistent with known primary left tonsillar squamous cell carcinoma. Left level 2 cervical lymphadenopathy, consistent with metastatic disease. No evidence of metastatic disease within the chest, abdomen, or pelvis. Stable 14 mm ground-glass and part solid nodular opacity in right lung apex compared to previous CT in 2016. This shows low-grade metabolic activity. Low-grade bronchogenic adenocarcinoma cannot be excluded. Consider surgical resection versus continued followup by CT in 12 months. This recommendation follows the consensus statement: Guidelines for Management of Small Pulmonary Nodules Detected on CT Images: From the Fleischner Society 2017; published online before print (10.1148/radiol.IJ:2314499). Electronically Signed   By: Earle Gell M.D.   On: 04/21/2016 12:27    ASSESSMENT:  Newly diagnosed squamous cell carcinoma of the Head & Neck, HPV N/A  PLAN:  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[facility]) The patient has locally advanced tonsillar cancer. He also has suspicious mildly hypermetabolic activity in the right upper lobe. The location is too difficult for biopsy. The patient is young without major comorbidities. To give it the benefit of the doubt, I am ready to offer the patient curative intent chemotherapy to be given along with concurrent radiation treatment. My plan would be to give him high dose cisplatin therapy on days 1, 22 and 42  In preparation for treatment, the patient will need the following tests or referrals, to be arranged: - Referral to dentist for full dental evaluation and possible dental extraction   - Referral for feeding tube placement.  - Infusaport placement - Referral to Speech Pathologist  - Referral to Nutritionist  - Referral to Social Worker - Referral to chemotherapy class to learn about practical tips while on treatment.  - Blood work  - Baseline hearing test  I will see him back for chemotherapy consent in the near future.   COPD GOLD II He is not symptomatic. Recommend close observation. The patient has quit smoking and I congratulated his effort  Lesion of right lung There is a suspicious right upper lung lesion noted on recent PET scan. It is mildly hypermetabolic. Differential diagnosis could be related to primary lung cancer, metastatic cancer from the tonsil to the lung, infectious etiology or inflammatory lesion. The location is difficult for biopsy. There is risk of pneumothorax I have discussed his case with the radiation oncologist. To give  it the benefit of the doubt, we will proceed with curative intent concurrent chemoradiation therapy and follow on the lung lesion closely with repeat imaging study in the near future    All questions were answered. The patient knows to call the clinic with any problems, questions or concerns. I spent 55 minutes counseling the patient face to face. The total time spent in the appointment was 60 minutes and more than 50% was on counseling.     [place], [place], MD 04/26/16 9:57 AM

## 2016-04-26 NOTE — Assessment & Plan Note (Signed)
There is a suspicious right upper lung lesion noted on recent PET scan. It is mildly hypermetabolic. Differential diagnosis could be related to primary lung cancer, metastatic cancer from the tonsil to the lung, infectious etiology or inflammatory lesion. The location is difficult for biopsy. There is risk of pneumothorax I have discussed his case with the radiation oncologist. To give it the benefit of the doubt, we will proceed with curative intent concurrent chemoradiation therapy and follow on the lung lesion closely with repeat imaging study in the near future

## 2016-04-26 NOTE — Progress Notes (Addendum)
  Oncology Nurse Navigator Documentation  Met with [name] [name] during initial consult with Dr. Isidore Moos.  He was unaccompanied.    1. Further introduced myself as his Navigator, explained my role as a member of the Care Team.   2. Provided New Patient Information packet, discussed contents:  Contact information for physician(s), myself, other members of the Care Team.  Advance Directive information ([place] blue pamphlet with LCSW contact info)  Fall Prevention Patient [place] sheet  Gross campus map with highlight of [place] 3. Provided introductory explanation of radiation treatment including SIM planning and purpose of Aquaplast head and shoulder mask, showed them example.   4. Provided and discussed education handouts for PEG.    5. 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  I indicated I will arrange a Financial Counseling appointment for him.  Lives with his mother, is her primary caregiver as she is disabled.  No longer smokes or drinks.  Travels by bus because he had his driver's license revoked. We discussed option for SCAT services. 6. I discussed patient mentoring program with him , he indicated interest. 7. We discussed his attendance at the 9/12 [facility] [place]. 8. Provided a tour of SIM and [place] areas, explained treatment and arrival procedures. 9. I encouraged him to contact me with questions/concerns as treatments/procedures begin.  He verbalized understanding of information provided.    Gayleen Orem, RN, BSN, [place] at [facility] [phone]   Navigator Location: [facility] (04/26/16 0800) Navigator Encounter Type: Initial RadOnc (04/26/16 0800)             Treatment Phase: Pre-Tx/Tx Discussion (04/26/16 0800) Barriers/Navigation Needs: Transportation;Financial (04/26/16 0800)                Acuity:  Level 2 (04/26/16 0800)   Acuity Level 2: Initial guidance, education and coordination as needed;Educational needs;Ongoing guidance and education throughout treatment as needed (04/26/16 0800)     Time Spent with Patient: 60 (04/26/16 0800)

## 2016-04-26 NOTE — Assessment & Plan Note (Signed)
The patient has locally advanced tonsillar cancer. He also has suspicious mildly hypermetabolic activity in the right upper lobe. The location is too difficult for biopsy. The patient is young without major comorbidities. To give it the benefit of the doubt, I am ready to offer the patient curative intent chemotherapy to be given along with concurrent radiation treatment. My plan would be to give him high dose cisplatin therapy on days 1, 22 and 42  In preparation for treatment, the patient will need the following tests or referrals, to be arranged: - Referral to dentist for full dental evaluation and possible dental extraction  - Referral for feeding tube placement.  - Infusaport placement - Referral to Speech Pathologist  - Referral to Nutritionist  - Referral to Social Worker - Referral to chemotherapy class to learn about practical tips while on treatment.  - Blood work  - Baseline hearing test  I will see him back for chemotherapy consent in the near future.

## 2016-04-27 ENCOUNTER — Other Ambulatory Visit: Payer: No Typology Code available for payment source

## 2016-04-27 ENCOUNTER — Ambulatory Visit
Admission: RE | Admit: 2016-04-27 | Discharge: 2016-04-27 | Disposition: A | Discharge status: Home / Self Care | Payer: Medicaid Other | Source: Ambulatory Visit | Attending: Radiation Oncology | Admitting: Radiation Oncology

## 2016-04-27 ENCOUNTER — Encounter (HOSPITAL_COMMUNITY): Payer: Self-pay | Admitting: Dentistry

## 2016-04-27 ENCOUNTER — Other Ambulatory Visit (HOSPITAL_COMMUNITY): Payer: Self-pay | Admitting: *Deleted

## 2016-04-27 ENCOUNTER — Ambulatory Visit (HOSPITAL_COMMUNITY): Payer: Self-pay | Admitting: Dentistry

## 2016-04-27 ENCOUNTER — Encounter (HOSPITAL_COMMUNITY)
Admission: RE | Admit: 2016-04-27 | Discharge: 2016-04-27 | Disposition: A | Discharge status: Home / Self Care | Payer: Medicaid Other | Source: Ambulatory Visit | Attending: Dentistry | Admitting: Dentistry

## 2016-04-27 ENCOUNTER — Encounter: Payer: Self-pay | Admitting: Radiation Oncology

## 2016-04-27 ENCOUNTER — Encounter (HOSPITAL_COMMUNITY): Payer: Self-pay

## 2016-04-27 ENCOUNTER — Encounter (INDEPENDENT_AMBULATORY_CARE_PROVIDER_SITE_OTHER): Payer: Self-pay

## 2016-04-27 VITALS — BP 115/75 | HR 62 | Temp 98.2°F

## 2016-04-27 DIAGNOSIS — IMO0002 Reserved for concepts with insufficient information to code with codable children: Secondary | ICD-10-CM

## 2016-04-27 DIAGNOSIS — K029 Dental caries, unspecified: Secondary | ICD-10-CM | POA: Insufficient documentation

## 2016-04-27 DIAGNOSIS — K036 Deposits [accretions] on teeth: Secondary | ICD-10-CM | POA: Insufficient documentation

## 2016-04-27 DIAGNOSIS — Z01818 Encounter for other preprocedural examination: Secondary | ICD-10-CM

## 2016-04-27 DIAGNOSIS — K08409 Partial loss of teeth, unspecified cause, unspecified class: Secondary | ICD-10-CM

## 2016-04-27 DIAGNOSIS — K045 Chronic apical periodontitis: Secondary | ICD-10-CM | POA: Insufficient documentation

## 2016-04-27 DIAGNOSIS — K030 Excessive attrition of teeth: Secondary | ICD-10-CM

## 2016-04-27 DIAGNOSIS — C090 Malignant neoplasm of tonsillar fossa: Secondary | ICD-10-CM

## 2016-04-27 DIAGNOSIS — M264 Malocclusion, unspecified: Secondary | ICD-10-CM | POA: Insufficient documentation

## 2016-04-27 DIAGNOSIS — K0889 Other specified disorders of teeth and supporting structures: Secondary | ICD-10-CM

## 2016-04-27 DIAGNOSIS — K0530 Chronic periodontitis, unspecified: Secondary | ICD-10-CM | POA: Insufficient documentation

## 2016-04-27 DIAGNOSIS — Z01810 Encounter for preprocedural cardiovascular examination: Secondary | ICD-10-CM | POA: Diagnosis present

## 2016-04-27 DIAGNOSIS — R9431 Abnormal electrocardiogram [ECG] [EKG]: Secondary | ICD-10-CM | POA: Diagnosis not present

## 2016-04-27 DIAGNOSIS — Z01812 Encounter for preprocedural laboratory examination: Secondary | ICD-10-CM | POA: Diagnosis not present

## 2016-04-27 HISTORY — DX: Malignant (primary) neoplasm, unspecified: C80.1

## 2016-04-27 LAB — CBC
HEMATOCRIT: 44.6 % (ref 39.0–52.0)
HEMOGLOBIN: 15.3 g/dL (ref 13.0–17.0)
MCH: 31.2 pg (ref 26.0–34.0)
MCHC: 34.3 g/dL (ref 30.0–36.0)
MCV: 91 fL (ref 78.0–100.0)
Platelets: 235 10*3/uL (ref 150–400)
RBC: 4.9 MIL/uL (ref 4.22–5.81)
RDW: 13.3 % (ref 11.5–15.5)
WBC: 8.7 10*3/uL (ref 4.0–10.5)

## 2016-04-27 LAB — COMPREHENSIVE METABOLIC PANEL
ALK PHOS: 52 U/L (ref 38–126)
ALT: 21 U/L (ref 17–63)
AST: 27 U/L (ref 15–41)
Albumin: 4.9 g/dL (ref 3.5–5.0)
Anion gap: 5 (ref 5–15)
BILIRUBIN TOTAL: 0.7 mg/dL (ref 0.3–1.2)
BUN: 12 mg/dL (ref 6–20)
CALCIUM: 9.9 mg/dL (ref 8.9–10.3)
CO2: 28 mmol/L (ref 22–32)
CREATININE: 0.74 mg/dL (ref 0.61–1.24)
Chloride: 107 mmol/L (ref 101–111)
GFR calc Af Amer: >60 mL/min (ref >60)
Glucose, Bld: 90 mg/dL (ref 65–99)
POTASSIUM: 4.4 mmol/L (ref 3.5–5.1)
Sodium: 140 mmol/L (ref 135–145)
TOTAL PROTEIN: 8.3 g/dL — AB (ref 6.5–8.1)

## 2016-04-27 LAB — SURGICAL PCR SCREEN
MRSA, PCR: NEGATIVE
Staphylococcus aureus: NEGATIVE

## 2016-04-27 LAB — TSH: TSH: 0.539 m[IU]/L (ref 0.320–4.118)

## 2016-04-27 NOTE — Progress Notes (Signed)
Financial Counseling--spoke with patient today--he is going to bring in a letter of support from mom--will evaluate for [place] once I receive letter of support

## 2016-04-27 NOTE — Progress Notes (Signed)
ekg 11-19-15 abnormal epic, will repeat with pre op today, echo 09-24-14 epic Chest xray 04-04-16

## 2016-04-27 NOTE — Telephone Encounter (Signed)
  Oncology Nurse Navigator Documentation  Received call from [place], [facility], confirming baseline hearing test has been scheduled for 05/02/16.  Dr. Alvy Bimler notified.  Gayleen Orem, RN, BSN, [place] at [facility] [phone]   Navigator Location: [place] (04/26/16 1515) Navigator Encounter Type: Telephone (04/26/16 1515) Telephone: Incoming Call;Appt Confirmation/Clarification (04/26/16 1515)             Barriers/Navigation Needs: Coordination of Care (04/26/16 1515)                          Time Spent with Patient: 15 (04/26/16 1515)

## 2016-04-27 NOTE — Patient Instructions (Signed)

## 2016-04-27 NOTE — Patient Instructions (Signed)
DENIEL BLAKEMAN  04/27/2016   Your procedure is scheduled on: 05-03-16  Report to [facility] Main  Entrance take [facility]  elevators to 3rd floor to  [place] at 525 AM.  Call this number if you have problems the morning of surgery [phone]   Remember: ONLY 1 PERSON MAY GO WITH YOU TO SHORT STAY TO GET  READY MORNING OF [place].  Do not eat food or drink liquids :After Midnight.     Take these medicines the morning of surgery with A SIP OF WATER: none                               You may not have any metal on your body including hair pins and              piercings  Do not wear jewelry, make-up, lotions, powders or perfumes, deodorant             Do not wear nail polish.  Do not shave  48 hours prior to surgery.              Men may shave face and neck.   Do not bring valuables to the hospital. [place].  Contacts, dentures or bridgework may not be worn into surgery.  Leave suitcase in the car. After surgery it may be brought to your room.     Patients discharged the day of surgery will not be allowed to drive home.  Name and phone number of your driver:  Special Instructions: N/A              Please read over the following fact sheets you were given: _____________________________________________________________________             [facility] - Preparing for Surgery Before surgery, you can play an important role.  Because skin is not sterile, your skin needs to be as free of germs as possible.  You can reduce the number of germs on your skin by washing with CHG (chlorahexidine gluconate) soap before surgery.  CHG is an antiseptic cleaner which kills germs and bonds with the skin to continue killing germs even after washing. Please DO NOT use if you have an allergy to CHG or antibacterial soaps.  If your skin becomes reddened/irritated stop using the CHG and inform your nurse when you arrive  at Short Stay. Do not shave (including legs and underarms) for at least 48 hours prior to the first CHG shower.  You may shave your face/neck. Please follow these instructions carefully:  1.  Shower with CHG Soap the night before surgery and the  morning of Surgery.  2.  If you choose to wash your hair, wash your hair first as usual with your  normal  shampoo.  3.  After you shampoo, rinse your hair and body thoroughly to remove the  shampoo.                           4.  Use CHG as you would any other liquid soap.  You can apply chg directly  to the skin and wash  Gently with a scrungie or clean washcloth.  5.  Apply the CHG Soap to your body ONLY FROM THE NECK DOWN.   Do not use on face/ open                           Wound or open sores. Avoid contact with eyes, ears mouth and genitals (private parts).                       Wash face,  Genitals (private parts) with your normal soap.             6.  Wash thoroughly, paying special attention to the area where your surgery  will be performed.  7.  Thoroughly rinse your body with warm water from the neck down.  8.  DO NOT shower/wash with your normal soap after using and rinsing off  the CHG Soap.                9.  Pat yourself dry with a clean towel.            10.  Wear clean pajamas.            11.  Place clean sheets on your bed the night of your first shower and do not  sleep with pets. Day of Surgery : Do not apply any lotions/deodorants the morning of surgery.  Please wear clean clothes to the hospital/surgery center.  FAILURE TO FOLLOW THESE INSTRUCTIONS MAY RESULT IN THE CANCELLATION OF YOUR SURGERY PATIENT SIGNATURE_________________________________  NURSE SIGNATURE__________________________________  ________________________________________________________________________

## 2016-04-27 NOTE — Progress Notes (Signed)
DENTAL CONSULTATION  Date of Consultation:  04/27/2016 Patient Name:   [name] [name] Date of Birth:   May 21, 1956 Medical Record Number: [id number]  VITALS: BP 115/75 (BP Location: Left Arm)   Pulse 62   Temp 98.2 F (36.8 C) (Oral)   CHIEF COMPLAINT: Patient referred by Dr. Erik Obey and Dr. Isidore Moos for a dental consultation.  HPI: [name] [name] is a 60 year old male recently diagnosed with squamous cell carcinoma of the left tonsil. Patient with anticipated chemoradiation therapy. Patient is now seen as part of a medically necessary pre-chemoradiation therapy dental protocol examination.  The patient currently denies to toothaches, swellings, or abscesses. Patient indicates that he broke off a maxillary tooth  (#7 ) approximately 2 months ago, but denies acute pain. Patient has not seen a dentist " in quite a while". Patient last saw Dr. Dahlia Byes approximately 10 years ago. Patient does not have any partial dentures. Patient denies having dental phobia.  PROBLEM LIST: Patient Active Problem List   Diagnosis Date Noted  . 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[place]) 04/24/2016    Priority: High  . Lesion of right lung 04/26/2016  . History of hepatitis C 04/24/2016  . Chest pain 09/17/2014  . Anxiety   . Dyspnea 03/25/2014  . COPD GOLD II 07/25/2013  . Sinusitis, chronic 07/25/2013    PMH: Past Medical History:  Diagnosis Date  . Anxiety   . COPD (chronic obstructive pulmonary disease) ([place])   . Hepatitis   . Hepatitis C    Treatment x 8 weeks 6-7 months ago [facility]  . Sinus congestion   . Stomach ulcer     PSH: Past Surgical History:  Procedure Laterality Date  . COLONOSCOPY WITH PROPOFOL N/A 04/14/2015   Procedure: COLONOSCOPY WITH PROPOFOL;  Surgeon: Arta Silence, MD;  Location: [facility];  Service: Endoscopy;  Laterality: N/A;  . HERNIA REPAIR    . STOMACH SURGERY     20 years    ALLERGIES: No Known Allergies  MEDICATIONS: Current Outpatient  Prescriptions  Medication Sig Dispense Refill  . ibuprofen (ADVIL,MOTRIN) 200 MG tablet Take 200 mg by mouth every 6 (six) hours as needed.     No current facility-administered medications for this visit.     LABS: Lab Results  Component Value Date   WBC 8.6 03/21/2016   HGB 15.3 04/04/2016   HCT 45.0 04/04/2016   MCV 93.3 03/21/2016   PLT 245 03/21/2016      Component Value Date/Time   NA 142 04/04/2016 0655   K 4.0 04/04/2016 0655   CL 104 04/04/2016 0655   CO2 27 03/21/2016 0828   GLUCOSE 96 04/04/2016 0655   BUN 14 04/04/2016 0655   CREATININE 0.70 04/04/2016 0655   CALCIUM 9.9 03/21/2016 0828   GFRNONAA >60 03/21/2016 0828   GFRAA >60 03/21/2016 0828   Lab Results  Component Value Date   INR 1.04 05/27/2015   No results found for: PTT  SOCIAL HISTORY: Social History   Social History  . Marital status: Single    Spouse name: N/A  . Number of children: 3  . Years of education: N/A   Occupational History  . disabled    Social History Main Topics  . Smoking status: Former Smoker    Packs/day: 0.50    Years: 30.00    Types: Cigarettes    Quit date: 01/03/2016  . Smokeless tobacco: Never Used  . Alcohol use No     Comment: quit about 3 months ago, drank beer (  he tells me a lot of beer)  . Drug use: No  . Sexual activity: Yes   Other Topics Concern  . Not on file   Social History Narrative   Lives with mother.    Mother is disabled with mental illness. He is the principal care giver   Has 3 children.   [name] is an important contact   He does not drive. Takes a bus. Ride a bike.    FAMILY HISTORY: Family History  Problem Relation Age of Onset  . Hypertension Mother   . Anxiety disorder Mother     REVIEW OF SYSTEMS: Reviewed with patient as per history of present illness. Psych: Patient denies dental phobia.  DENTAL HISTORY: CHIEF COMPLAINT: Patient referred by Dr. Erik Obey and Dr. Isidore Moos for a dental consultation.  HPI: [name] [name] is a 60 year old male recently diagnosed with squamous cell carcinoma of the left tonsil. Patient with anticipated chemoradiation therapy. Patient is now seen as part of a medically necessary pre-chemoradiation therapy dental protocol examination.  The patient currently denies to toothaches, swellings, or abscesses. Patient indicates that he broke off a maxillary tooth  (#7 ) approximately 2 months ago, but denies acute pain. Patient has not seen a dentist " in quite a while". Patient last saw Dr. Dahlia Byes approximately 10 years ago. Patient does not have any partial dentures. Patient denies having dental phobia.  DENTAL EXAMINATION: GENERAL: The patient is a well-developed, well-nourished male in no acute distress. HEAD AND NECK: Thre is left neck lymphadenopathy noted. No right neck lymphadenopathy is palpated. Patient denies acute TMJ symptoms. Patient has maximum interincisal opening of 45 mm. INTRAORAL EXAM: Patient has normal saliva. There is no evidence of oral abscess formation. DENTITION: Patient is missing tooth numbers 3, 5,12-15, 17,19, and 30-32. Tooth #7 is present as a retained root segment. PERIODONTAL: Patient has chronic periodontitis with plaque and calculus accumulations, gingival recession, and tooth mobility as charted. There is moderate horizontal and vertical bone loss noted. Radiographic calculus is noted. DENTAL CARIES/SUBOPTIMAL RESTORATIONS: Multiple dental caries are noted per dental charting form. There is evidence of maxillary and mandibular anterior incisal attrition. ENDODONTIC: Patient currently denies acute pulpitis symptoms. Patient has periapical pathology and radiolucency associated with the apex of tooth #7. CROWN AND BRIDGE: There are no crown or bridge restorations. PROSTHODONTIC: Patient denies having partial dentures. OCCLUSION: Patient has a poor occlusal scheme secondary to multiple missing teeth, supra-eruption and drifting of the unopposed teeth  into the edentulous areas, and lack of replacement of missing teeth with dental prostheses.  RADIOGRAPHIC INTERPRETATION: An orthopantogram was taken and supplemented with 12 periapical radiographs. There are multiple missing teeth. There is a retained root segment #7. There is moderate horizontal and vertical bone loss. There is supra-eruption and drifting of the unopposed teeth into the edentulous areas. Dental caries are noted. There is evidence of maxillary and mandibular incisal attrition. There is a radiolucency at the apex of tooth #7. Radiographic calculus is noted.   ASSESSMENTS: 1. Squamous cell carcinoma of the left tonsil. 2. Prechemoradiation therapy dental protocol 3. Chronic apical periodontitis 4. Dental caries 5. Chronic periodontitis with bone loss 6. Accretions 7. Gingival recession 8. Tooth mobility 9. Multiple missing teeth 10. Supra-eruption and drifting of the unopposed teeth into the edentulous areas 11. No partial dentures 12. Poor occlusal scheme and malocclusion 13. Maxillary and mandibular anterior incisal attrition   PLAN/RECOMMENDATIONS: 1. I discussed the risks, benefits, and complications of various treatment options with the patient  in relationship to his medical and dental conditions, anticipated chemoradiation therapy, and chemoradiation therapy side effects to include xerostomia, radiation caries, trismus, mucositis, taste changes, gum and jawbone changes, and risk for infection, bleeding,  and osteoradionecrosis.   We discussed various treatment options to include no treatment, multiple extractions with alveoloplasty, pre-prosthetic surgery as indicated, periodontal therapy, dental restorations, root canal therapy, crown and bridge therapy, implant therapy, and replacement of missing teeth as indicated. We also discussed fabrication of fluoride trays. The patient currently wishes to proceed with multiple extractions with alveoloplasty, pre-prosthetic  surgery as needed, and gross debridement of remaining dentition in the operating room with general anesthesia. This has been scheduled for 05/03/2016 at 7:30 AM at [facility]. Patient did agree to proceed with impressions today for the fabrication of fluoride trays. Patient tolerated the procedure well.  Patient understands the need for follow-up with a new primary dentist after the radiation therapy to establish periodontal recall and evaluation for other dental treatment needs at that time to include dental restorations and evaluation for replacement of missing teeth as indicated.  2. Discussion of findings with medical team and coordination of future medical and dental care as needed.  I spent in excess of  120 minutes during the conduct of this consultation and >50% of this time involved direct face-to-face encounter for counseling and/or coordination of the patient's care.    Lenn Cal, DDS

## 2016-04-28 ENCOUNTER — Encounter: Payer: Self-pay | Admitting: Radiation Oncology

## 2016-04-28 NOTE — Progress Notes (Signed)
Financial Counseling--patient brought in letter of support--qualifies for 400.00 [place] grant--information scanned into share point

## 2016-05-02 ENCOUNTER — Telehealth: Payer: Self-pay | Admitting: *Deleted

## 2016-05-02 NOTE — Telephone Encounter (Signed)
  Oncology Nurse Navigator Documentation  Spoke with [name] [name], informed him [name] [name] has received voucher for his transport to [facility] tomorrow morning for dental extractions.  He voiced understanding of a 5:00 AM pick-up for a 5:30 arrival; where he is to report upon arrival.  He stated he has made arrangements with a friend to take him home after the procedure.  Gayleen Orem, RN, BSN, [place] at [facility] [phone]   Navigator Location: [place] (05/02/16 1549) Navigator Encounter Type: Letter/Fax/Email;Telephone (05/02/16 1549) Telephone: Outgoing Call (05/02/16 1549)             Barriers/Navigation Needs: Coordination of Care (05/02/16 1549)                          Time Spent with Patient: 30 (05/02/16 1549)

## 2016-05-02 NOTE — Anesthesia Preprocedure Evaluation (Addendum)
Anesthesia Evaluation  Patient identified by MRN, date of birth, ID band Patient awake    Reviewed: Allergy & Precautions, NPO status , Patient's Chart, lab work & pertinent test results  Airway Mallampati: I  TM Distance: >3 FB Neck ROM: Full    Dental  (+) Poor Dentition, Chipped, Dental Advisory Given   Pulmonary COPD, former smoker,    breath sounds clear to auscultation       Cardiovascular negative cardio ROS   Rhythm:Regular Rate:Normal     Neuro/Psych PSYCHIATRIC DISORDERS Anxiety negative neurological ROS     GI/Hepatic PUD, (+) Hepatitis -, C  Endo/Other  negative endocrine ROS  Renal/GU negative Renal ROS  negative genitourinary   Musculoskeletal negative musculoskeletal ROS (+)   Abdominal Normal abdominal exam  (+)   Peds negative pediatric ROS (+)  Hematology negative hematology ROS (+)   Anesthesia Other Findings   Reproductive/Obstetrics negative OB ROS                            Lab Results  Component Value Date   WBC 8.7 04/27/2016   HGB 15.3 04/27/2016   HCT 44.6 04/27/2016   MCV 91.0 04/27/2016   PLT 235 04/27/2016   Lab Results  Component Value Date   CREATININE 0.74 04/27/2016   BUN 12 04/27/2016   NA 140 04/27/2016   K 4.4 04/27/2016   CL 107 04/27/2016   CO2 28 04/27/2016   Lab Results  Component Value Date   INR 1.04 05/27/2015   04/2016 EKG: sinus bradycardia.  Stress Echo: Normal  Anesthesia Physical Anesthesia Plan  ASA: III  Anesthesia Plan: General   Post-op Pain Management:    Induction: Intravenous  Airway Management Planned: Nasal ETT  Additional Equipment:   Intra-op Plan:   Post-operative Plan: Extubation in OR  Informed Consent: I have reviewed the patients History and Physical, chart, labs and discussed the procedure including the risks, benefits and alternatives for the proposed anesthesia with the patient or  authorized representative who has indicated his/her understanding and acceptance.   Dental advisory given  Plan Discussed with: CRNA  Anesthesia Plan Comments: (Liver fx stable, plt count good, will proceed with Nasal intubation. Will order INR if bleeding noted. )       Anesthesia Quick Evaluation

## 2016-05-03 ENCOUNTER — Ambulatory Visit (HOSPITAL_COMMUNITY)
Admission: RE | Admit: 2016-05-03 | Discharge: 2016-05-03 | Disposition: A | Discharge status: Home / Self Care | Payer: Medicaid Other | Source: Ambulatory Visit | Attending: Dentistry | Admitting: Dentistry

## 2016-05-03 ENCOUNTER — Encounter (HOSPITAL_COMMUNITY): Payer: Self-pay | Admitting: *Deleted

## 2016-05-03 ENCOUNTER — Encounter (HOSPITAL_COMMUNITY)
Admission: RE | Disposition: A | Discharge status: Home / Self Care | Payer: Self-pay | Source: Ambulatory Visit | Attending: Dentistry

## 2016-05-03 ENCOUNTER — Other Ambulatory Visit: Payer: Self-pay | Admitting: Hematology and Oncology

## 2016-05-03 ENCOUNTER — Ambulatory Visit (HOSPITAL_COMMUNITY): Payer: Medicaid Other | Admitting: Anesthesiology

## 2016-05-03 DIAGNOSIS — K045 Chronic apical periodontitis: Secondary | ICD-10-CM

## 2016-05-03 DIAGNOSIS — K036 Deposits [accretions] on teeth: Secondary | ICD-10-CM | POA: Diagnosis not present

## 2016-05-03 DIAGNOSIS — C090 Malignant neoplasm of tonsillar fossa: Secondary | ICD-10-CM

## 2016-05-03 DIAGNOSIS — K083 Retained dental root: Secondary | ICD-10-CM | POA: Insufficient documentation

## 2016-05-03 DIAGNOSIS — Z87891 Personal history of nicotine dependence: Secondary | ICD-10-CM | POA: Diagnosis not present

## 2016-05-03 DIAGNOSIS — M264 Malocclusion, unspecified: Secondary | ICD-10-CM

## 2016-05-03 DIAGNOSIS — K029 Dental caries, unspecified: Secondary | ICD-10-CM

## 2016-05-03 DIAGNOSIS — C099 Malignant neoplasm of tonsil, unspecified: Secondary | ICD-10-CM | POA: Diagnosis not present

## 2016-05-03 DIAGNOSIS — J449 Chronic obstructive pulmonary disease, unspecified: Secondary | ICD-10-CM | POA: Diagnosis not present

## 2016-05-03 DIAGNOSIS — K0530 Chronic periodontitis, unspecified: Secondary | ICD-10-CM

## 2016-05-03 HISTORY — PX: MULTIPLE EXTRACTIONS WITH ALVEOLOPLASTY: SHX5342

## 2016-05-03 SURGERY — MULTIPLE EXTRACTION WITH ALVEOLOPLASTY
Anesthesia: General

## 2016-05-03 MED ORDER — BUPIVACAINE-EPINEPHRINE (PF) 0.5% -1:200000 IJ SOLN
INTRAMUSCULAR | Status: AC
  Filled 2016-05-03: qty 3.6

## 2016-05-03 MED ORDER — OXYMETAZOLINE HCL 0.05 % NA SOLN
NASAL | Status: DC | PRN
  Administered 2016-05-03: 2 via NASAL

## 2016-05-03 MED ORDER — ONDANSETRON HCL 4 MG/2ML IJ SOLN
INTRAMUSCULAR | Status: AC
  Filled 2016-05-03: qty 2

## 2016-05-03 MED ORDER — LIDOCAINE-EPINEPHRINE 2 %-1:100000 IJ SOLN
INTRAMUSCULAR | Status: AC
  Filled 2016-05-03: qty 10.2

## 2016-05-03 MED ORDER — LIDOCAINE 2% (20 MG/ML) 5 ML SYRINGE
INTRAMUSCULAR | Status: AC
  Filled 2016-05-03: qty 20

## 2016-05-03 MED ORDER — ISOPROPYL ALCOHOL 70 % SOLN
Status: AC
  Filled 2016-05-03: qty 480

## 2016-05-03 MED ORDER — MIDAZOLAM HCL 2 MG/2ML IJ SOLN
INTRAMUSCULAR | Status: AC
  Filled 2016-05-03: qty 2

## 2016-05-03 MED ORDER — LACTATED RINGERS IV SOLN
INTRAVENOUS | Status: DC
  Administered 2016-05-03 (×2): via INTRAVENOUS

## 2016-05-03 MED ORDER — PROMETHAZINE HCL 25 MG/ML IJ SOLN: 6.2500 mg | INTRAMUSCULAR | Status: DC | PRN

## 2016-05-03 MED ORDER — ROCURONIUM BROMIDE 100 MG/10ML IV SOLN
INTRAVENOUS | Status: DC | PRN
  Administered 2016-05-03: 60 mg via INTRAVENOUS

## 2016-05-03 MED ORDER — CEFAZOLIN SODIUM-DEXTROSE 2-4 GM/100ML-% IV SOLN
2.0000 g | Freq: Once | INTRAVENOUS | Status: AC
Start: 2016-05-03 — End: 2016-05-03
  Administered 2016-05-03: 2 g via INTRAVENOUS

## 2016-05-03 MED ORDER — MEPERIDINE HCL 50 MG/ML IJ SOLN: 6.2500 mg | INTRAMUSCULAR | Status: DC | PRN

## 2016-05-03 MED ORDER — ROCURONIUM BROMIDE 10 MG/ML (PF) SYRINGE
PREFILLED_SYRINGE | INTRAVENOUS | Status: AC
  Filled 2016-05-03: qty 20

## 2016-05-03 MED ORDER — LIDOCAINE HCL (CARDIAC) 20 MG/ML IV SOLN
INTRAVENOUS | Status: DC | PRN
  Administered 2016-05-03: 50 mg via INTRAVENOUS

## 2016-05-03 MED ORDER — SUGAMMADEX SODIUM 200 MG/2ML IV SOLN
INTRAVENOUS | Status: AC
  Filled 2016-05-03: qty 2

## 2016-05-03 MED ORDER — HYDROMORPHONE HCL 1 MG/ML IJ SOLN: 0.2500 mg | INTRAMUSCULAR | Status: DC | PRN

## 2016-05-03 MED ORDER — PHENYLEPHRINE 40 MCG/ML (10ML) SYRINGE FOR IV PUSH (FOR BLOOD PRESSURE SUPPORT)
PREFILLED_SYRINGE | INTRAVENOUS | Status: DC | PRN
  Administered 2016-05-03: 80 ug via INTRAVENOUS

## 2016-05-03 MED ORDER — CEFAZOLIN SODIUM-DEXTROSE 2-4 GM/100ML-% IV SOLN
INTRAVENOUS | Status: AC
  Filled 2016-05-03: qty 100

## 2016-05-03 MED ORDER — DEXAMETHASONE SODIUM PHOSPHATE 10 MG/ML IJ SOLN
INTRAMUSCULAR | Status: DC | PRN
  Administered 2016-05-03: 10 mg via INTRAVENOUS

## 2016-05-03 MED ORDER — HYDROMORPHONE HCL 1 MG/ML IJ SOLN
0.2500 mg | INTRAMUSCULAR | Status: DC | PRN
  Administered 2016-05-03: 0.5 mg via INTRAVENOUS

## 2016-05-03 MED ORDER — LACTATED RINGERS IV SOLN: INTRAVENOUS | Status: DC

## 2016-05-03 MED ORDER — FENTANYL CITRATE (PF) 100 MCG/2ML IJ SOLN
INTRAMUSCULAR | Status: AC
  Filled 2016-05-03: qty 2

## 2016-05-03 MED ORDER — PHENYLEPHRINE 40 MCG/ML (10ML) SYRINGE FOR IV PUSH (FOR BLOOD PRESSURE SUPPORT)
PREFILLED_SYRINGE | INTRAVENOUS | Status: AC
  Filled 2016-05-03: qty 10

## 2016-05-03 MED ORDER — PROPOFOL 10 MG/ML IV BOLUS
INTRAVENOUS | Status: AC
  Filled 2016-05-03: qty 40

## 2016-05-03 MED ORDER — ONDANSETRON HCL 4 MG/2ML IJ SOLN
INTRAMUSCULAR | Status: DC | PRN
  Administered 2016-05-03: 4 mg via INTRAVENOUS

## 2016-05-03 MED ORDER — PROPOFOL 10 MG/ML IV BOLUS
INTRAVENOUS | Status: DC | PRN
  Administered 2016-05-03: 200 mg via INTRAVENOUS

## 2016-05-03 MED ORDER — OXYMETAZOLINE HCL 0.05 % NA SOLN
NASAL | Status: AC
  Filled 2016-05-03: qty 15

## 2016-05-03 MED ORDER — SUGAMMADEX SODIUM 200 MG/2ML IV SOLN
INTRAVENOUS | Status: DC | PRN
  Administered 2016-05-03: 200 mg via INTRAVENOUS

## 2016-05-03 MED ORDER — HYDROCODONE-ACETAMINOPHEN 5-325 MG PO TABS: 1.0000 | ORAL_TABLET | Freq: Four times a day (QID) | ORAL | 0 refills | Status: DC | PRN

## 2016-05-03 MED ORDER — HYDROMORPHONE HCL 1 MG/ML IJ SOLN
INTRAMUSCULAR | Status: AC
  Administered 2016-05-03: 0.5 mg via INTRAVENOUS
  Filled 2016-05-03: qty 1

## 2016-05-03 MED ORDER — DEXAMETHASONE SODIUM PHOSPHATE 10 MG/ML IJ SOLN
INTRAMUSCULAR | Status: AC
  Filled 2016-05-03: qty 1

## 2016-05-03 MED ORDER — FENTANYL CITRATE (PF) 100 MCG/2ML IJ SOLN
INTRAMUSCULAR | Status: DC | PRN
  Administered 2016-05-03 (×2): 50 ug via INTRAVENOUS
  Administered 2016-05-03: 100 ug via INTRAVENOUS

## 2016-05-03 MED ORDER — MIDAZOLAM HCL 5 MG/5ML IJ SOLN
INTRAMUSCULAR | Status: DC | PRN
  Administered 2016-05-03: 2 mg via INTRAVENOUS

## 2016-05-03 MED ORDER — FENTANYL CITRATE (PF) 100 MCG/2ML IJ SOLN
INTRAMUSCULAR | Status: AC
Start: 2016-05-03 — End: 2016-05-03
  Filled 2016-05-03: qty 2

## 2016-05-03 MED FILL — HYDROCODON-APAP 5-325: 5-325 | 4 days supply | Qty: 32 | Fill #0

## 2016-05-03 SURGICAL SUPPLY — 30 items
ATTRACTOMAT 16X20 MAGNETIC DRP (DRAPES) ×3 IMPLANT
BAG ZIPLOCK 12X15 (MISCELLANEOUS) IMPLANT
BANDAGE EYE OVAL (MISCELLANEOUS) ×6 IMPLANT
BLADE SURG 15 STRL LF DISP TIS (BLADE) ×2 IMPLANT
BLADE SURG 15 STRL SS (BLADE) ×4
CANNULA VESSEL W/WING WO/VALVE (CANNULA) ×3 IMPLANT
GAUZE SPONGE 4X4 12PLY STRL (GAUZE/BANDAGES/DRESSINGS) ×3 IMPLANT
GAUZE SPONGE 4X4 16PLY XRAY LF (GAUZE/BANDAGES/DRESSINGS) ×3 IMPLANT
GLOVE BIOGEL PI IND STRL 6 (GLOVE) ×1 IMPLANT
GLOVE BIOGEL PI INDICATOR 6 (GLOVE) ×2
GLOVE SURG ORTHO 8.0 STRL STRW (GLOVE) ×3 IMPLANT
GLOVE SURG SS PI 6.0 STRL IVOR (GLOVE) ×3 IMPLANT
GOWN STRL REUS W/TWL 2XL LVL3 (GOWN DISPOSABLE) ×3 IMPLANT
GOWN STRL REUS W/TWL LRG LVL3 (GOWN DISPOSABLE) ×3 IMPLANT
KIT BASIN OR (CUSTOM PROCEDURE TRAY) ×3 IMPLANT
NEEDLE DENTAL RB 25GX1.25 (NEEDLE) IMPLANT
NS IRRIG 1000ML POUR BTL (IV SOLUTION) ×3 IMPLANT
PACK EENT SPLIT (PACKS) ×3 IMPLANT
PACKING VAGINAL (PACKING) ×3 IMPLANT
SPONGE SURGIFOAM ABS GEL 100 (HEMOSTASIS) ×3 IMPLANT
SUCTION FRAZIER HANDLE 12FR (TUBING)
SUCTION TUBE FRAZIER 12FR DISP (TUBING) IMPLANT
SUT CHROMIC 3 0 PS 2 (SUTURE) ×9 IMPLANT
SUT CHROMIC 4 0 P 3 18 (SUTURE) IMPLANT
SYR 50ML LL SCALE MARK (SYRINGE) ×3 IMPLANT
TOWEL OR 17X26 10 PK STRL BLUE (TOWEL DISPOSABLE) ×3 IMPLANT
TUBING CONNECTING 10 (TUBING) ×2 IMPLANT
TUBING CONNECTING 10' (TUBING) ×1
WATER STERILE IRR 1500ML POUR (IV SOLUTION) ×3 IMPLANT
YANKAUER SUCT BULB TIP NO VENT (SUCTIONS) ×3 IMPLANT

## 2016-05-03 NOTE — Anesthesia Procedure Notes (Signed)
Procedure Name: Intubation Date/Time: 05/03/2016 8:01 AM Performed by: Deazia Lampi, Virgel Gess Pre-anesthesia Checklist: Patient identified, Emergency Drugs available, Suction available and Patient being monitored Patient Re-evaluated:Patient Re-evaluated prior to inductionOxygen Delivery Method: Circle System Utilized Preoxygenation: Pre-oxygenation with 100% oxygen Intubation Type: IV induction Ventilation: Mask ventilation without difficulty Laryngoscope Size: Glidescope Grade View: Grade I Nasal Tubes: Nasal Rae and Nasal prep performed Number of attempts: 1 Placement Confirmation: ETT inserted through vocal cords under direct vision,  positive ETCO2,  breath sounds checked- equal and bilateral and CO2 detector Secured at: 29 cm Tube secured with: Tape Dental Injury: Teeth and Oropharynx as per pre-operative assessment

## 2016-05-03 NOTE — Progress Notes (Signed)
ED Cm discussed this with the pt  Reviewed the [facility] card program and his availability to go to get his rx filled via this program at [facility] given

## 2016-05-03 NOTE — Op Note (Signed)
OPERATIVE REPORT  Patient:            MADDOXX PORTNOY Date of Birth:  May 13, 1956 MRN:                [id number]   DATE OF PROCEDURE:  05/03/2016  PREOPERATIVE DIAGNOSES: 1. Squamous cell carcinoma of the left tonsil 2. Pre-chemoradiation therapy dental protocol 3. Chronic apical periodontitis 4. Dental caries 5. Retained root segment 6. Malocclusion 7. Chronic periodontitis 8. Accretions  POSTOPERATIVE DIAGNOSES: 1. Squamous cell carcinoma of the left tonsil 2. Pre-chemoradiation therapy dental protocol 3. Chronic apical periodontitis 4. Dental caries 5. Retained root segment 6. Malocclusion 7. Chronic periodontitis 8. Accretions  OPERATIONS: 1. Multiple extraction of tooth numbers 1, 2, 7, 16, and 18 2. 3 Quadrants of alveoloplasty 3. Gross debridement of remaining dentition   SURGEON: Lenn Cal, DDS  ASSISTANT: Camie Patience, (dental assistant)  ANESTHESIA: General anesthesia via nasoendotracheal tube.  MEDICATIONS: 1. Ancef 2 g IV prior to invasive dental procedures. 2. Local anesthesia with a total utilization of 4 carpules each containing 34 mg of lidocaine with 0.017 mg of epinephrine as well as 2 carpules each containing 9 mg of bupivacaine with 0.009 mg of epinephrine.  SPECIMENS: There are 5 teeth that were discarded.  DRAINS: None  CULTURES: None  COMPLICATIONS: None   ESTIMATED BLOOD LOSS: 100 mLs.  INTRAVENOUS FLUIDS: 1000 mLs of Lactated ringers solution.  INDICATIONS: The patient was recently diagnosed with squamous cell carcinoma of the left tonsil.  A medically necessary dental consultation was then requested to evaluate poor dentition.  The patient was examined and treatment planned for multiple extractions with alveoloplasty and gross debridement of remaining dentition in the operating room with general anesthesia.  This treatment plan was formulated to decrease the risks and complications associated with dental infection from  affecting the patient's systemic health and to prevent future complications such as infection and osteoradionecrosis  OPERATIVE FINDINGS: Patient was examined operating room number 2.  The teeth were identified for extraction. The patient was noted be affected by chronic periodontitis, chronic apical periodontitis, dental caries, retained root segment, accretions, and malocclusion.   DESCRIPTION OF PROCEDURE: Patient was brought to the main operating room number 2. Patient was then placed in the supine position on the operating table. General anesthesia was then induced per the anesthesia team. The patient was then prepped and draped in the usual manner for dental medicine procedure. A timeout was performed. The patient was identified and procedures were verified. A throat pack was placed at this time. The oral cavity was then thoroughly examined with the findings noted above. The patient was then ready for dental medicine procedure as follows:  Local anesthesia was then administered sequentially with a total utilization of 4 carpules each containing 34 mg of lidocaine with 0.017 mg of epinephrine as well as 2 carpules  each containing 9 mg bupivacaine with 0.009 mg of epinephrine.  The Maxillary left and right quadrants first approached. Anesthesia was then delivered utilizing infiltration with lidocaine with epinephrine. A #15 blade incision was then made from the maxillary right tuberosity and extended to the mesial of #4.  A  surgical flap was then carefully reflected. Appropriate amounts of buccal and interseptal bone were then removed around tooth numbers 1 and 2 utilizing a surgical handpiece and bur and copious amounts of sterile water.  The teeth were then subluxated with a series of straight elevators. Tooth numbers 1 and 2 were then removed with a 53R  forceps without complications.Alveoloplasty was then performed utilizing a ronguers and bone file to assist in obtaining primary closure. A  series of 15 blade incisions were then made to remove the redundant soft tissue. The surgical site was then irrigated with copious amounts of sterile saline. The tissues were approximated and trimmed appropriately. A piece of Surgifoam was then placed in the extraction sockets of tooth numbers 1 and 2. The surgical site was then closed from the maxillary right tuberosity and extended to the distal of #4 utilizing 3-0 chromic gut suture in a continuous interrupted suture technique 1.  At this point time tooth #7 was approached. A 15 blade incision was used to make sulcular incisions on the buccal and palatal aspects of tooth #7. A flap was then reflected appropriately. Tooth numbers 7 with then subluxated with a series straight elevators and removed with a 150 forceps without complications. Alveoloplasty was performed utilizing a Rongeurs and bone file to assist in obtaining primary closure. The surgical site was then irrigated with copious amounts sterile saline. A piece of Surgifoam was placed in the extraction socket. Surgical site was then closed from the mesial of #6 and extended to the distal of #8 utilizing 3-0 chromic gut suture in a continuous interrupted suture technique 1.   At this point time the maxillary left quadrant was approached. A 15 blade incision was made from the maxillary left tuberosity and extended to the mesial of #14. A surgical flap was then carefully reflected. Appropriate amounts of buccal and interseptal bone was then removed around tooth #16 utilizing a surgical handpiece and bur and copious amounts sterile water. Tooth #16 was then subluxated and removed with a 53L forceps without complications. Alveoloplasty was then performed with a rongeur and bone file to help achieve primary closure. A series of 15 blade incisions were made to remove redundant tissue. The surgical site was irrigated with copious amounts sterile saline. A piece of Surgifoam was then placed extraction socket  #16. Surgical site was then closed from the maxillary left tuberosity and extended the mesial #14 utilizing 3-0 chromic gut suture in a continuous interrupted suture technique 1.  At this point time, the mandibular quadrants were approached. The patient was given bilateral inferior alveolar nerve blocks and long buccal nerve blocks utilizing the bupivacaine with epinephrine. Further infiltration was then achieved utilizing the lidocaine with epinephrine. A 15 blade incision was then made from the distal of number 17 and extended to the mesial of #20.  A surgical flap was then carefully reflected. Appropriate amounts of buccal and interseptal bone was removed with a surgical handpiece and bur and copious amounts of sterile water around tooth number 18.  The coronal aspect of tooth #18 was then removed with a 23 forceps leaving the roots remaining. Further bone was then removed with a surgical handpiece and bur and copious amounts of sterile water around retained root segments of 18. The roots were then elevated out with a series of cryers elevators without complications. Alveoloplasty was then performed utilizing a rongeur and bone file to assist in achieving primary closure. The surgical site was then irrigated with copious postural saline. A piece of Surgifoam was placed in the extraction socket #18. The surgical site was then closed from the distal of #17 and extended to the distal of #20 utilizing 3-0 chromic gut suture in a continuous interrupted suture technique 1. 2 additional interrupted sutures are then placed further closed surgical site. An interproximal suture was then placed between tooth  numbers 20 and 21 with 3-0 chromic gut material.  At this point time the gross debridement procedure was achieved utilizing a sonic scaler. A series of hand curettes were then used to further remove accretions. A sonic scaler was then again used to further refine removal of accretions.  At this point time, the  entire mouth was irrigated with copious amounts of sterile saline. The patient was examined for complications, seeing none, the dental medicine procedure was deemed to be complete. The throat pack was removed at this time. An oral airway was then placed at the request of the anesthesia team. A series of 4 x 4 gauze were placed in the mouth to aid hemostasis. The patient was then handed over to the anesthesia team for final disposition. After an appropriate amount of time, the patient was extubated and taken to the postanesthsia care unit in good condition. All counts were correct for the dental medicine procedure. The patient will be seen in 7-10 days for evaluation for suture removal. Chemoradiation therapy should be able to start in approximately 2 weeks at the discretion of Drs. Alvy Bimler and Isidore Moos.   Lenn Cal, DDS.

## 2016-05-03 NOTE — Progress Notes (Signed)
PRE-OPERATIVE NOTE:  05/03/2016 [name] [name] [id number]  VITALS: BP (!) 129/91   Pulse 86   Temp 98.2 F (36.8 C) (Oral)   Resp 18   Ht 6' 1.5" (1.867 m)   Wt 157 lb (71.2 kg)   SpO2 100%   BMI 20.43 kg/m   Lab Results  Component Value Date   WBC 8.7 04/27/2016   HGB 15.3 04/27/2016   HCT 44.6 04/27/2016   MCV 91.0 04/27/2016   PLT 235 04/27/2016   BMET    Component Value Date/Time   NA 140 04/27/2016 1155   K 4.4 04/27/2016 1155   CL 107 04/27/2016 1155   CO2 28 04/27/2016 1155   GLUCOSE 90 04/27/2016 1155   BUN 12 04/27/2016 1155   CREATININE 0.74 04/27/2016 1155   CALCIUM 9.9 04/27/2016 1155   GFRNONAA >60 04/27/2016 1155   GFRAA >60 04/27/2016 1155    Lab Results  Component Value Date   INR 1.04 05/27/2015   No results found for: PTT   [name] [name] presents for multiple dental extractions with alveoloplasty, pre-prosthetic surgery as needed, and gross debridement of remaining dentition in the operating room.   SUBJECTIVE: The patient denies any acute medical or dental changes and agrees to proceed with treatment as planned.  EXAM: No sign of acute dental changes.  ASSESSMENT: Patient is affected by chronic apical periodontitis, dental caries, chronic periodontitis, accretions, and malocclusion.   PLAN: Patient agrees to proceed with treatment as planned in the operating room as previously discussed and accepts the risks, benefits, and complications of the proposed treatment. Patient is aware of the risk for bleeding, bruising, swelling, infection, pain, nerve damage,  soft tissue damage, damage to adjacent teeth, sinus involvement, root tip fracture, mandible fracture, and the risks of complications associated with the anesthesia. Patient also is aware of the potential for other complications not mentioned above.   Lenn Cal, DDS

## 2016-05-03 NOTE — Anesthesia Postprocedure Evaluation (Signed)
Anesthesia Post Note  Patient: [name] [name]  Procedure(s) Performed: Procedure(s) (LRB): Multiple extractions with alveoloplasty and gross debridement of remaining teeth. (N/A)  Patient location during evaluation: PACU Anesthesia Type: General Level of consciousness: awake and alert Pain management: pain level controlled Vital Signs Assessment: post-procedure vital signs reviewed and stable Respiratory status: spontaneous breathing, nonlabored ventilation, respiratory function stable and patient connected to nasal cannula oxygen Cardiovascular status: blood pressure returned to baseline and stable Postop Assessment: no signs of nausea or vomiting Anesthetic complications: no    Last Vitals:  Vitals:   05/03/16 1035 05/03/16 1054  BP: (!) 134/106 130/90  Pulse: 62   Resp: 16   Temp: 36.7 C     Last Pain:  Vitals:   05/03/16 1054  TempSrc:   PainSc: 2                  Effie Berkshire

## 2016-05-03 NOTE — Progress Notes (Addendum)
ED CM consulted by PACU to assist this self pay/uninsured pt with medications assistance   CM reviewed EPIC notes and chart review information CM spoke with the pt about [facility] MATCH program ($3 co pay for each Rx through [facility] program, does not include refills, 7 day expiration of MATCH letter and choice of pharmacies) Pt agreed to receive assistance from program    Pt is eligible for [facility] MATCH program (unable to find pt listed in PDMI per cardholder name inquiry)  PDMI information entered. [place] letter completed but found out pt did not need this letter   CM spoke with pt who confirms self pay [facility] resident with no pcp. CM discussed and provided written information for self pay pcps, importance of pcp for f/u care, www.needymeds.org, discounted pharmacies and other [employer] such as financial assistance, DSS and  health department  Reviewed resources for [employer] self pay pcps like Jinny Blossom, family medicine at [address], [facility], general medical clinics, [facility] urgent care plus others, [facility] out patient pharmacies, housing, and other resources in [employer]. Pt voiced understanding and appreciation of resources provided

## 2016-05-03 NOTE — H&P (Signed)
05/03/2016   Patient:            [name] [name] Date of Birth:  01/22/1956 MRN:                [id number]   BP (!) 129/91   Pulse 86   Temp 98.2 F (36.8 C) (Oral)   Resp 18   Ht 6' 1.5" (1.867 m)   Wt 157 lb (71.2 kg)   SpO2 100%   BMI 20.43 kg/m    RAHMEEK BARTOLOTTI is a 60 year old male with history of left tonsil cancer. Patient with anticipated chemoradiation therapy. Patient now presents for multiple extractions with alveoloplasty, pre-prosthetic surgery as needed, and gross debridement of remaining dentition the operating room with general anesthesia. Patient denies any acute medical or dental changes. Please see Dr. Alvy Bimler note of 04/26/16 to use as the H&P for the dental operating room procedure.  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 male is here because of newly diagnosed left tonsil cancer According to the patient, the first initial presentation was due to palpable lump on the left side of his neck. I review his records extensively and summarized as follows:       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[place])   04/04/2016 Initial Diagnosis    He saw ENT and had laryngoscopy and biopsy of left tonsil     04/04/2016 Pathology Results    [id number] biopsy showed invasive moderately differentiated squamous cell carcinoma     04/04/2016 Imaging    MR brain showed no acute intracranial finding. Chronic small-vessel ischemic changes of the cerebral hemispheric white matter.      04/04/2016 Imaging    CT neck showed left tonsillar mass with diameter of 19 x 24 mm consistent with 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 necrosis. Suspicious level 5/supraclavicular nodes on  the left. Scar type density in the right upper lobe is more prominent than was seen on a CT scan of the chest 10/06/2014     04/21/2016 PET scan    Asymmetric hypermetabolic soft tissue prominence in left palatine tonsil, consistent with known primary left tonsillar squamous cell Carcinoma. Left level 2 cervical lymphadenopathy, consistent with metastatic disease. No evidence of metastatic disease within the chest, abdomen, or pelvis. Stable 14 mm ground-glass and part solid nodular opacity in right lung apex compared to previous CT in 2016. This shows low-grade metabolic activity. Low-grade bronchogenic adenocarcinoma cannot be excluded. Consider surgical resection versus continued followup by CT in 12 months      he denies any hearing deficit, difficulties with chewing food, swallowing difficulties, painful swallowing, changes in the quality of his voice or abnormal weight loss. The patient had poor social circumstances. He has a disabled son who lives with his ex-girlfriend's family. 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 The patient does not have reliable transportation. His risk factors included strong smoking history and prior alcohol intake. The patient had been abstinent from both in the last few months  MEDICAL HISTORY:      Past Medical History:  Diagnosis Date  . Anxiety   . COPD (chronic obstructive pulmonary disease) ([place])   . Hepatitis   .  Hepatitis C    Treatment x 8 weeks 6-7 months ago Har[facility]  . Sinus congestion   . Stomach ulcer     SURGICAL HISTORY:      Past Surgical History:  Procedure Laterality Date  . COLONOSCOPY WITH PROPOFOL N/A 04/14/2015   Procedure: COLONOSCOPY WITH PROPOFOL;  Surgeon: Arta Silence, MD;  Location: [facility];  Service: Endoscopy;  Laterality: N/A;  . HERNIA REPAIR    . STOMACH SURGERY     20 years    SOCIAL HISTORY: Social History        Social History  .  Marital status: Single    Spouse name: N/A  . Number of children: 3  . Years of education: N/A       Occupational History  . disabled          Social History Main Topics  . Smoking status: Former Smoker    Packs/day: 0.50    Years: 30.00    Types: Cigarettes    Quit date: 01/03/2016  . Smokeless tobacco: Never Used  . Alcohol use No     Comment: quit about 3 months ago, drank beer (he tells me a lot of beer)  . Drug use: No  . Sexual activity: Yes       Other Topics Concern  . Not on file      Social History Narrative   Lives with mother.    Mother is disabled with mental illness. He is the principal care giver   Has 3 children.   [name] is an important contact   He does not drive. Takes a bus. Ride a bike.    FAMILY HISTORY:      Family History  Problem Relation Age of Onset  . Hypertension Mother   . Anxiety disorder Mother     ALLERGIES:  has No Known Allergies.  MEDICATIONS:        Current Outpatient Prescriptions  Medication Sig Dispense Refill  . ibuprofen (ADVIL,MOTRIN) 200 MG tablet Take 200 mg by mouth every 6 (six) hours as needed.     No current facility-administered medications for this visit.             Facility-Administered Medications Ordered in Other Visits  Medication Dose Route Frequency Provider Last Rate Last Dose  . fludeoxyglucose F - 18 (FDG) injection Q000111Q millicurie  Q000111Q millicurie Intravenous Once PRN David A Martinique, MD        REVIEW OF SYSTEMS:   Constitutional: Denies fevers, chills or abnormal night sweats Eyes: Denies blurriness of vision, double vision or watery eyes Ears, nose, mouth, throat, and face: Denies mucositis or sore throat Respiratory: Denies cough, dyspnea or wheezes Cardiovascular: Denies palpitation, chest discomfort or lower extremity swelling Gastrointestinal:  Denies nausea, heartburn or change in bowel habits Skin: Denies abnormal skin  rashes Neurological:Denies numbness, tingling or new weaknesses Behavioral/Psych: Mood is stable, no new changes  All other systems were reviewed with the patient and are negative.  PHYSICAL EXAMINATION: ECOG PERFORMANCE STATUS: 1 - Symptomatic but completely ambulatory     Vitals:   04/26/16 0909  BP: 132/87  Pulse: 60  Resp: 18  Temp: 98.2 F (36.8 C)      Filed Weights   04/26/16 0909  Weight: 162 lb 8 oz (73.7 kg)    GENERAL:alert, no distress and comfortable SKIN: skin color, texture, turgor are normal, no rashes or significant lesions EYES: normal, conjunctiva are pink and non-injected, sclera clear OROPHARYNX:He has large tonsillar mass  on the left side NECK: supple, thyroid normal size, non-tender, without nodularity LYMPH:  He has palpable regional lymphadenopathy in the left neck LUNGS: clear to auscultation and percussion with normal breathing effort HEART: regular rate & rhythm and no murmurs and no lower extremity edema ABDOMEN:abdomen soft, non-tender and normal bowel sounds Musculoskeletal:no cyanosis of digits and no clubbing  PSYCH: alert & oriented x 3 with fluent speech NEURO: no focal motor/sensory deficits  LABORATORY DATA:  I have reviewed the data as listed RecentLabs       Lab Results  Component Value Date   WBC 8.6 03/21/2016   HGB 15.3 04/04/2016   HCT 45.0 04/04/2016   MCV 93.3 03/21/2016   PLT 245 03/21/2016     RecentLabs       Lab Results  Component Value Date   NA 142 04/04/2016   K 4.0 04/04/2016   CL 104 04/04/2016   CO2 27 03/21/2016      RADIOGRAPHIC STUDIES: I have personally reviewed the radiological images as listed and agreed with the findings in the report.  ImagingResults  Dg Chest 2 View  Result Date: 04/04/2016 CLINICAL DATA:  Left-sided pharyngeal mass with lymphadenopathy EXAM: CHEST  2 VIEW COMPARISON:  Chest radiograph November 18, 2015 and chest CT October 06, 2014 FINDINGS: There is  a small area of spiculation in the right apex measuring approximately 1 cm in size which is stable compared to prior studies. There is no edema or consolidation. Heart size and pulmonary vascularity are normal. No adenopathy. There is an old healed fracture of the posterior left sixth rib. No blastic or lytic bone lesions are evident. IMPRESSION: Small area spiculation in the right apex remains stable. No new opacity. No edema or consolidation. Stable cardiac silhouette. No adenopathy evident. Electronically Signed   By: Lowella Grip III M.D.   On: 04/04/2016 07:04   Ct Soft Tissue Neck W Contrast  Result Date: 04/04/2016 CLINICAL DATA:  Swelling of the face and neck over the last month. Focal prominence of the left side of the neck. EXAM: CT NECK WITH CONTRAST TECHNIQUE: Multidetector CT imaging of the neck was performed using the standard protocol following the bolus administration of intravenous contrast. CONTRAST:  53mL ISOVUE-300 IOPAMIDOL (ISOVUE-300) INJECTION 61% COMPARISON:  None. FINDINGS: Pharynx and larynx: There is a 1.9 x 2.4 cm left tonsillar mass. No other mucosal or submucosal lesion is seen. Salivary glands: Submandibular and parotid glands are normal. Thyroid: Normal Lymph nodes: Level 2 nodal mass on the left measuring 3.3 by 1.9 by 2.1 cm consistent with metastatic disease. No abnormal nodes by size criteria and level 3 or level 4. Enlarged supraclavicular/ level 5 nodes on the left measuring up to short axis dimension 11 mm. Vascular: Normal Limited intracranial: Normal Visualized orbits: Normal Mastoids and visualized paranasal sinuses: Clear Skeleton: Ordinary cervical spondylosis Upper chest: Focal density at the right lung apex consistent with scarring, but with apparent growth in that region since 8 chest CT dated 10/06/2014. Dominant nodular focus measures 13 mm, but appears to contain more tissue volume. Complete chest CT evaluation suggested. This may require further  evaluation with PET. IMPRESSION: Left tonsillar mass with diameter of 19 x 24 mm consistent with 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 necrosis. Suspicious level 5/supraclavicular nodes on the left. Scar type density in the right upper lobe is more prominent than was seen on a CT scan of the chest 10/06/2014. Therefore this is viewed with some suspicion  and malignancy in this location is possible. Complete chest CT and possible PET for further evaluation. Electronically Signed   By: Nelson Chimes M.D.   On: 04/04/2016 09:27   Mr Brain Wo Contrast (neuro Protocol)  Result Date: 04/04/2016 CLINICAL DATA:  Swelling of the left neck and dizziness, several days duration. EXAM: MRI HEAD WITHOUT CONTRAST TECHNIQUE: Multiplanar, multiecho pulse sequences of the brain and surrounding structures were obtained without intravenous contrast. COMPARISON:  CT neck same day.  Previous head MRI 05/27/2015 FINDINGS: Diffusion imaging does not show any acute or subacute infarction. Restricted diffusion lesion is seen in the left tonsil, further described on the neck CT. Brainstem and cerebellum are normal. Cerebral hemispheres show changes of chronic small vessel disease within the white matter, similar to the previous study. No large vessel territory infarction. No mass lesion, hemorrhage, hydrocephalus or extra-axial collection. No pituitary mass. No inflammatory sinus disease. No skull or skullbase lesion. IMPRESSION: No acute intracranial finding. Chronic small-vessel ischemic changes of the cerebral hemispheric white matter. Restricted diffusion left tonsillar mass, completely described at neck CT. Electronically Signed   By: Nelson Chimes M.D.   On: 04/04/2016 09:34   Nm Pet Image Initial (pi) Skull Base To Thigh  Result Date: 04/21/2016 CLINICAL DATA:  Initial treatment strategy for left tonsillar squamous cell carcinoma. EXAM: NUCLEAR MEDICINE PET SKULL BASE TO THIGH TECHNIQUE: 7.7 mCi F-18  FDG was injected intravenously. Full-ring PET imaging was performed from the skull base to thigh after the radiotracer. CT data was obtained and used for attenuation correction and anatomic localization. FASTING BLOOD GLUCOSE:  Value: 103 mg/dl COMPARISON:  Chest CT on 10/06/2014 FINDINGS: NECK Asymmetric soft tissue prominence in the left palatine tonsil shows hyper metabolism, with SUV max of 29.9. This is consistent with known primary left tonsillar squamous cell carcinoma. Left level 2 lymphadenopathy is seen measuring 1.5 cm on image 25/4, which is hypermetabolic with SUV max of [id number]. This is consistent with lymph node metastasis. No other hypermetabolic cervical lymphadenopathy identified. CHEST No hypermetabolic lymph nodes within the thorax. Ground-glass and part solid nodular opacity in the right lung apex measures 14 mm on image 8/series 8. This is unchanged compared to previous CT on 10/06/2014, however this shows mild hypermetabolic activity with SUV max of 1.5. No other pulmonary nodules or masses identified. No evidence of pleural effusion. ABDOMEN/PELVIS No abnormal hypermetabolic activity within the liver, pancreas, adrenal glands, or spleen. No hypermetabolic lymph nodes in the abdomen or pelvis. SKELETON No focal hypermetabolic activity to suggest skeletal metastasis. IMPRESSION: Asymmetric hypermetabolic soft tissue prominence in left palatine tonsil, consistent with known primary left tonsillar squamous cell carcinoma. Left level 2 cervical lymphadenopathy, consistent with metastatic disease. No evidence of metastatic disease within the chest, abdomen, or pelvis. Stable 14 mm ground-glass and part solid nodular opacity in right lung apex compared to previous CT in 2016. This shows low-grade metabolic activity. Low-grade bronchogenic adenocarcinoma cannot be excluded. Consider surgical resection versus continued followup by CT in 12 months. This recommendation follows the consensus statement:  Guidelines for Management of Small Pulmonary Nodules Detected on CT Images: From the Fleischner Society 2017; published online before print (10.1148/radiol.IJ:2314499). Electronically Signed   By: Earle Gell M.D.   On: 04/21/2016 12:27     ASSESSMENT:  Newly diagnosed squamous cell carcinoma of the Head & Neck, HPV N/A  PLAN:  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HCC) The patient has locally advanced tonsillar cancer. He also has suspicious mildly hypermetabolic activity in the  right upper lobe. The location is too difficult for biopsy. The patient is young without major comorbidities. To give it the benefit of the doubt, I am ready to offer the patient curative intent chemotherapy to be given along with concurrent radiation treatment. My plan would be to give him high dose cisplatin therapy on days 1, 22 and 42  In preparation for treatment, the patient will need the following tests or referrals, to be arranged: - Referral to dentist for full dental evaluation and possible dental extraction  - Referral for feeding tube placement.  - Infusaport placement - Referral to Speech Pathologist  - Referral to Nutritionist  - Referral to Social Worker - Referral to chemotherapy class to learn about practical tips while on treatment.  - Blood work  - Baseline hearing test  I will see him back for chemotherapy consent in the near future.   COPD GOLD II He is not symptomatic. Recommend close observation. The patient has quit smoking and I congratulated his effort  Lesion of right lung There is a suspicious right upper lung lesion noted on recent PET scan. It is mildly hypermetabolic. Differential diagnosis could be related to primary lung cancer, metastatic cancer from the tonsil to the lung, infectious etiology or inflammatory lesion. The location is difficult for biopsy. There is risk of pneumothorax I have discussed his case with the radiation oncologist. To give it the  benefit of the doubt, we will proceed with curative intent concurrent chemoradiation therapy and follow on the lung lesion closely with repeat imaging study in the near future    All questions were answered. The patient knows to call the clinic with any problems, questions or concerns. I spent 55 minutes counseling the patient face to face. The total time spent in the appointment was 60 minutes and more than 50% was on counseling.     Stokes, [place], MD 04/26/16 9:57 AM

## 2016-05-03 NOTE — Progress Notes (Signed)
ED CM called [facility] outpatient pharmacy and spoke with Adonis Brook who confirms pt is listed with $382 for the green card program via [facility] cancer center set up by lenise Pt and Short stay RN, dawn updated

## 2016-05-03 NOTE — Transfer of Care (Signed)
Immediate Anesthesia Transfer of Care Note  Patient: [name] [name]  Procedure(s) Performed: Procedure(s): Multiple extractions with alveoloplasty and gross debridement of remaining teeth. (N/A)  Patient Location: PACU  Anesthesia Type:General  Level of Consciousness:  sedated, patient cooperative and responds to stimulation  Airway & Oxygen Therapy:Patient Spontanous Breathing and Patient connected to face mask oxgen  Post-op Assessment:  Report given to PACU RN and Post -op Vital signs reviewed and stable  Post vital signs:  Reviewed and stable  Last Vitals:  Vitals:   05/03/16 0530  BP: (!) 129/91  Pulse: 86  Resp: 18  Temp: Q000111Q C    Complications: No apparent anesthesia complications

## 2016-05-03 NOTE — Discharge Instructions (Signed)

## 2016-05-03 NOTE — Progress Notes (Signed)
Pt has miminal bleeding. Gauze intact

## 2016-05-05 ENCOUNTER — Telehealth: Payer: Self-pay | Admitting: Hematology and Oncology

## 2016-05-05 ENCOUNTER — Ambulatory Visit: Payer: Medicaid Other | Admitting: Radiation Oncology

## 2016-05-05 NOTE — Telephone Encounter (Signed)
Spoke with pt to confirm 9/14 appts per LOS

## 2016-05-09 ENCOUNTER — Other Ambulatory Visit: Payer: Self-pay | Admitting: *Deleted

## 2016-05-09 MED ORDER — MONTELUKAST SODIUM 10 MG PO TABS: 10.0000 mg | ORAL_TABLET | Freq: Every day | ORAL | 3 refills | Status: DC

## 2016-05-09 MED FILL — MONTELUKAST SOD 10 MG TAB: 10 | 30 days supply | Qty: 30 | Fill #0

## 2016-05-10 ENCOUNTER — Ambulatory Visit: Payer: Medicaid Other | Admitting: Radiation Oncology

## 2016-05-10 ENCOUNTER — Ambulatory Visit
Admission: RE | Admit: 2016-05-10 | Discharge: 2016-05-10 | Disposition: A | Discharge status: Home / Self Care | Payer: Medicaid Other | Source: Ambulatory Visit | Attending: Radiation Oncology | Admitting: Radiation Oncology

## 2016-05-10 ENCOUNTER — Other Ambulatory Visit: Payer: Self-pay | Admitting: Hematology and Oncology

## 2016-05-10 ENCOUNTER — Encounter: Payer: Self-pay | Admitting: *Deleted

## 2016-05-10 VITALS — BP 119/78 | HR 69 | Temp 98.6°F | Ht 73.5 in | Wt 158.7 lb

## 2016-05-10 DIAGNOSIS — Z510 Encounter for antineoplastic radiation therapy: Secondary | ICD-10-CM | POA: Diagnosis not present

## 2016-05-10 DIAGNOSIS — C090 Malignant neoplasm of tonsillar fossa: Secondary | ICD-10-CM

## 2016-05-10 MED ORDER — SODIUM CHLORIDE 0.9% FLUSH
10.0000 mL | Freq: Once | INTRAVENOUS | Status: AC
  Administered 2016-05-10: 10 mL via INTRAVENOUS

## 2016-05-10 NOTE — Progress Notes (Signed)
.  nurse

## 2016-05-10 NOTE — Progress Notes (Signed)
Head and Neck Cancer Simulation, IMRT treatment planning, and Special treatment procedure note   outpatient  Diagnosis:    ICD-9-CM ICD-10-CM   1. 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HCC) 146.1 C09.0     The patient was taken to the CT simulator and laid in the supine position on the table. An Aquaplast head and shoulder mask was custom fitted to the patient's anatomy. High-resolution CT axial imaging was obtained of the head and neck with contrast. I verified that the quality of the imaging is good for treatment planning. 1 Medically Necessary Treatment Device was fabricated and supervised by me: Aquaplast mask.   Treatment planning note I plan to treat the patient with IMRT. I plan to treat the patient's tumor and bilateral neck nodes. I plan to treat to a total dose of 70 Gray in 35  fractions. Dose calculation was ordered from dosimetry.  IMRT planning Note  IMRT is an important modality to deliver adequate dose to the patient's at risk tissues while sparing the patient's normal structures, including the: esophagus, parotid tissue, mandible, brain stem, spinal cord, oral cavity, brachial plexus.  This justifies the use of IMRT in the patient's treatment.   Special Treatment Procedure Note:  The patient will be receiving chemotherapy concurrently. Chemotherapy heightens the risk of side effects. I have considered this during the patient's treatment planning process and will monitor the patient accordingly for side effects on a weekly basis. Concurrent chemotherapy increases the complexity of this patient's treatment and therefore this constitutes a special treatment procedure.  -----------------------------------  Eppie Gibson, MD

## 2016-05-12 ENCOUNTER — Other Ambulatory Visit: Payer: Self-pay | Admitting: Hematology and Oncology

## 2016-05-12 NOTE — Progress Notes (Signed)
Oncology Nurse Navigator Documentation  To provide support, encouragement and care continuity, met with Mr. Daughtridge during CT SIM. He tolerated procedure well though with some anxiety.   He stated afterwards he thinks "I'll be OK" during treatment. I showed him [place] treatment area, explained registration, arrival, preparation and treatment procedures. He voiced understanding of information provided.  Gayleen Orem, RN, BSN, [place] [facility] [phone]

## 2016-05-15 ENCOUNTER — Encounter (HOSPITAL_COMMUNITY): Payer: Self-pay | Admitting: Dentistry

## 2016-05-15 ENCOUNTER — Encounter (INDEPENDENT_AMBULATORY_CARE_PROVIDER_SITE_OTHER): Payer: Self-pay

## 2016-05-15 ENCOUNTER — Telehealth: Payer: Self-pay | Admitting: *Deleted

## 2016-05-15 ENCOUNTER — Ambulatory Visit (HOSPITAL_COMMUNITY): Payer: No Typology Code available for payment source | Admitting: Dentistry

## 2016-05-15 VITALS — BP 117/77 | HR 52 | Temp 98.3°F

## 2016-05-15 DIAGNOSIS — C090 Malignant neoplasm of tonsillar fossa: Secondary | ICD-10-CM

## 2016-05-15 DIAGNOSIS — Z463 Encounter for fitting and adjustment of dental prosthetic device: Secondary | ICD-10-CM | POA: Diagnosis not present

## 2016-05-15 DIAGNOSIS — K08409 Partial loss of teeth, unspecified cause, unspecified class: Secondary | ICD-10-CM

## 2016-05-15 DIAGNOSIS — Z01818 Encounter for other preprocedural examination: Secondary | ICD-10-CM

## 2016-05-15 MED ORDER — SODIUM FLUORIDE 1.1 % DT GEL: DENTAL | 99 refills | Status: DC

## 2016-05-15 MED FILL — FLUORISHIELD 1.1% GEL: 1.1 % | 30 days supply | Qty: 114 | Fill #0

## 2016-05-15 NOTE — Patient Instructions (Addendum)
PLAN: 1. Brush after meals and at bedtime. Brush tongue daily. 2. Use salt water rinses as needed to aid healing. 3. Use fluoride in fluoride trays at bedtime as instructed. Prescription for FluoriSHIELD was sent to [facility] outpatient pharmacy. PRN refills. 4. Multiple sips of water as needed. 5. Return to clinic in approximately 2-3 weeks for oral examination during chemoradiation therapy. 6. Call if problems arise before then.   Lenn Cal, DDS    FLUORIDE TRAYS PATIENT INSTRUCTIONS    Obtain prescription from the pharmacy.  Don't be surprised if it needs to be ordered.  Be sure to let the pharmacy know when you are close to needing a new refill for them to have it ready for you without interruption of Fluoride use.  The best time to use your Fluoride is before bed time.  You must brush your teeth very well and floss before using the Fluoride in order to get the best use out of the Fluoride treatments.  Place 1 drop of Fluoride gel per tooth in the tray.  Place the tray on your lower teeth and your upper teeth.  Make sure the trays are seated all the way.  Remember, they only fit one way on your teeth.  Insert for 5 full minutes.  At the end of the 5 minutes, take the trays out.  SPIT OUT excess. .  Do NOT rinse your mouth!  Do NOT eat or drink after treatments for at least 30 minutes.  This is why the best time for your treatments is before bedtime.  Clean the inside of your Fluoride trays using COLD WATER and a toothbrush.  In order to keep your Trays from discoloring and free from odors, soak them overnight in denture cleaners such as Efferdent.  Do not use bleach or non denture products.  Store the trays in a safe dry place AWAY from any heat until your next treatment.  If anything happens to your Fluoride trays, or they don't fit as well after any dental work, please let us know as soon as possible.

## 2016-05-15 NOTE — Progress Notes (Signed)
POST OPERATIVE NOTE:  05/15/2016 MANLEY TUNKS [id number]  VITALS: BP 117/77 (BP Location: Left Arm)   Pulse (!) 52   Temp 98.3 F (36.8 C) (Oral)   LABS:  Lab Results  Component Value Date   WBC 8.7 04/27/2016   HGB 15.3 04/27/2016   HCT 44.6 04/27/2016   MCV 91.0 04/27/2016   PLT 235 04/27/2016   BMET    Component Value Date/Time   NA 140 04/27/2016 1155   K 4.4 04/27/2016 1155   CL 107 04/27/2016 1155   CO2 28 04/27/2016 1155   GLUCOSE 90 04/27/2016 1155   BUN 12 04/27/2016 1155   CREATININE 0.74 04/27/2016 1155   CALCIUM 9.9 04/27/2016 1155   GFRNONAA >60 04/27/2016 1155   GFRAA >60 04/27/2016 1155    Lab Results  Component Value Date   INR 1.04 05/27/2015   No results found for: PTT   [name] is status post Multiple extractions with alveoloplasty and gross drain remaining dentition on 05/03/2016.  The patient presents for evaluation of healing and suture removal as well as insertion of upper lower fluoride trays.  SUBJECTIVE: Patient with minimal complaints from dental extraction sites. Patient indicates his some stitches have fallen out., Some are still present.  EXAM: There is no sign of infection, heme, or ooze. Some sutures remain. Patient is healing in by generalized primary closure.  PROCEDURE: The patient was given a chlorhexidine gluconate rinse for 30 seconds. Sutures were then removed without complication. Patient tolerated the procedure well. Appliances were tried in and adjusted as needed. [place] Trismus device was previously fabricated at 50 mm using 25 sticks. Postop instructions were provided and a written and verbal format concerning the use and care of appliances. All questions were answered. Patient to return to clinic for periodic oral examination in approximately 2-3 weeks during radiation therapy. Patient to call if questions or problems arise before then.  ASSESSMENT: Post operative course is consistent with dental  procedures performed in the OR. Patient is cleared to start chemoradiation therapy on 05/22/2016.  PLAN: 1. Brush after meals and at bedtime. Brush tongue daily. 2. Use salt water rinses as needed to aid healing. 3. Use fluoride in fluoride trays at bedtime as instructed. Prescription for FluoriSHIELD was sent to [facility] outpatient pharmacy. PRN refills. 4. Multiple sips of water as needed. 5. Return to clinic in approximately 2-3 weeks for oral examination during chemoradiation therapy. 6. Call if problems arise before then.   Lenn Cal, DDS

## 2016-05-15 NOTE — Telephone Encounter (Signed)
Per staff message and LOS I have scheduled appts. Patient will receive scheduler in chemo class

## 2016-05-16 ENCOUNTER — Encounter: Payer: Self-pay | Admitting: *Deleted

## 2016-05-16 ENCOUNTER — Ambulatory Visit: Payer: Medicaid Other | Attending: Radiation Oncology | Admitting: Physical Therapy

## 2016-05-16 ENCOUNTER — Ambulatory Visit: Payer: Medicaid Other

## 2016-05-16 ENCOUNTER — Ambulatory Visit: Payer: No Typology Code available for payment source | Admitting: Nutrition

## 2016-05-16 ENCOUNTER — Other Ambulatory Visit: Payer: Self-pay | Admitting: General Surgery

## 2016-05-16 ENCOUNTER — Ambulatory Visit
Admission: RE | Admit: 2016-05-16 | Discharge: 2016-05-16 | Disposition: A | Discharge status: Home / Self Care | Payer: Medicaid Other | Source: Ambulatory Visit | Attending: Radiation Oncology | Admitting: Radiation Oncology

## 2016-05-16 VITALS — BP 134/91 | HR 65 | Temp 98.6°F | Wt 161.5 lb

## 2016-05-16 DIAGNOSIS — R29898 Other symptoms and signs involving the musculoskeletal system: Secondary | ICD-10-CM | POA: Diagnosis present

## 2016-05-16 DIAGNOSIS — R1310 Dysphagia, unspecified: Secondary | ICD-10-CM | POA: Diagnosis present

## 2016-05-16 DIAGNOSIS — C090 Malignant neoplasm of tonsillar fossa: Secondary | ICD-10-CM

## 2016-05-16 NOTE — Progress Notes (Signed)
Patient was seen in head and neck clinic.  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  He is a patient of Dr. Alvy Bimler and Dr. Isidore Moos. He will receive cisplatin and radiation therapy.  Past medical history includes alcohol use, anxiety, COPD, hepatitis C.  Medications include hydrocodone.  Labs include albumin 4.9.  Height: 6 feet, 1-1/2 inches. Weight: 161.5 pounds. Usual body weight: 140-160 pounds. BMI: 21.02.  Estimated nutrition needs: 2460-2660 calories, 95-110 grams protein, 2.6 L fluid.  Patient reports he has always been thin and has difficulty gaining weight. Reports recent weight gain is a result of him quitting tobacco. Patient very interested in information on eating and gaining weight. Patient and mother live together and provide care for each other.  Patient enjoys cooking. Feeding tube is scheduled to be placed on Wednesday.  Nutrition diagnosis:  Predicted suboptimal energy intake related to tonsil cancer and associated treatments as evidenced by the history or condition for which research shows inadequate energy intake.  Intervention: Patient was educated to consume high-calorie, high-protein foods in 6 small meals daily. Provided fact sheets on increasing calories and protein. Provided samples of oral nutrition supplements. Brief education provided on tube feeding. Educated patient on eating with nausea and vomiting and provided fact sheet. Questions were answered.  Teach back method used.  Contact information given.  Monitoring, evaluation, goals:  Patient will work to increase calories and protein to minimize weight loss throughout treatment.  Next visit: September 18, during infusion.   **Disclaimer: This note was dictated with voice recognition software. Similar sounding words can inadvertently be transcribed and this note may contain transcription errors which may not have been corrected upon publication of note.**

## 2016-05-16 NOTE — Progress Notes (Signed)
Pt here for patient teaching.  Pt given Radiation and You booklet, Managing Acute Radiation Side Effects for Head and Neck Cancer handout, skin care instructions and Sonafine. Pt reports they have not watched the Radiation Therapy Education video, but were given the link to watch at home  Reviewed areas of pertinence such as fatigue, hair loss, skin changes, throat changes and taste changes . Pt able to give teach back of to pat skin, use unscented/gentle soap and drink plenty of water,apply Sonafine bid, avoid applying anything to skin within 4 hours of treatment and to use an electric razor if they must shave. Pt verbalizes understanding of information given and will contact nursing with any questions or concerns.     Http://rtanswers.org/treatmentinformation/whattoexpect/index  Managing Acute Radiation Side Effects for Head and Neck Cancer  Skin irritation:  . Sonafine  Topical Emulsion: First-line topical cream to help soothe skin irritation.  Apply to skin in radiation fields at least 4 hours before radiotherapy, or any time after treatments during the rest of the day.  . Triple Antibiotic Ointment (Neosporin): Apply to areas of skin with moist breakdown to prevent infection.  . 1% hydrocortisone cream: Apply to areas of skin that are itching, up to three times a day.  Arnetha Massy (Silver Sulfadiazine): Used in select cases if large patches of skin develop moist breakdown (let physician or nurse know if you have a "sulfa" drug allergy)  Soreness in mouth or throat: . Baking Soda Rinse: a home remedy to soothe/cleanse mouth and loosen thick saliva.  Mix 1/2 teaspoon salt, 1/2 teaspoon baking soda, 1 pint water (16 oz or two cups).  Swish, gargle and spit as needed to soothe/cleanse mouth. Use as often as you want.  . Sucralfate (Carafate): coats throat to soothe it before meals or any time of day. Crush 1 tablet in 10 mL H20 and swallow up to four times a day.  . 2% viscous Lidocaine  (Magic Mouth Wash): Soothes mouth and/or throat by numbing your mucous membranes. Mix 1 part 2% viscous lidocaine (Magic Mouth Wash), 1 part H20. Swish and/or swallow 11mL of this mixture, 58min before meals and at bedtime, up to four times a day. Alternate with Sucralfate (Carafate).  . Narcotics: Various short acting and long acting narcotics can be prescribed.  Often, medical oncology will prescribe these if you are receiving chemotherapy concurrently. Narcotics may cause constipation. It may be helpful to take a stool softener (Docusate Sodium) or gentle laxative (ie Senna or Polyethylene Glycol) to prevent constipation.  Having food in your stomach before ingesting a narcotic may reduce risk of stomach upset.  Thick Saliva: . Baking Soda Rinse: a home remedy to soothe/cleanse mouth and loosen thick saliva.  Mix 1/2 teaspoon salt, 1/2 teaspoon baking soda, 1 pint water (16 oz or two cups).  Swish, gargle, and spit as needed to soothe/cleanse mouth. Use as often as you want.  . Some patients find Diet Ginger Ale or Papaya Juice to be helpful.  . In extreme cases, your physician may consider prescribing a Scopolamine transdermal patch which dries up your saliva.     Poor taste, or lack of taste:   . There are no well-established medications to combat taste bud changes from radiotherapy.  It often takes weeks to months to regain taste function.  Eating bland foods and drinking nutritional shakes  may help you maintain your weight when food is not enjoyable.  Some patients supplement their oral intake with a feeding tube.  Fatigue  and weakness: . There is not a well-established safe and effective medication to combat radiation-induced fatigue.  However, if you are able to perform light exercise (such as a daily walk, yoga, recumbent stationary bicycling), this may combat fatigue and help you maintain muscle mass during treatment.  . Maintaining hydration and nutrition are also important.  If you  have not been referred to a nutritionist and would like a referral, please let your nurse or physician know.  . Try to get at least 8 hours of sleep each night. You may need a daily nap, but try not to nap so late that it interferes with your nightly sleep schedule.

## 2016-05-16 NOTE — Therapy (Signed)
[facility] [address] [place], [place], [address] Phone: [phone]   Fax:  [phone]  Physical Therapy Evaluation  Patient Details  Name: [name] [name] MRN: [id number] Date of Birth: 29-May-1956 Referring Provider: Dr. Lonie PeakSarah Squire  Encounter Date: 05/16/2016      PT End of Session - 05/16/16 0928    Visit Number 1   Number of Visits 1   PT Start Time 0846   PT Stop Time 0914   PT Time Calculation (min) 28 min   Activity Tolerance Patient tolerated treatment well   Behavior [facility] for tasks assessed/performed      Past Medical History:  Diagnosis Date  . Anxiety   . Cancer (HCC) 04/04/2016   cancer of left tonsil  . COPD (chronic obstructive pulmonary disease) (HCC)   . Hepatitis   . Hepatitis C    Treatment x 8 weeks 6-7 months ago Harvoni  . Sinus congestion   . Stomach ulcer     Past Surgical History:  Procedure Laterality Date  . COLONOSCOPY WITH PROPOFOL N/A 04/14/2015   Procedure: COLONOSCOPY WITH PROPOFOL;  Surgeon: Willis ModenaWilliam Outlaw, MD;  Location: [facility];  Service: Endoscopy;  Laterality: N/A;  . HERNIA REPAIR    . MULTIPLE EXTRACTIONS WITH ALVEOLOPLASTY N/A 05/03/2016   Procedure: Multiple extractions with alveoloplasty and gross debridement of remaining teeth.;  Surgeon: Charlynne Panderonald F Kulinski, DDS;  Location: [facility];  Service: Oral Surgery;  Laterality: N/A;  . STOMACH SURGERY     20 years    There were no vitals filed for this visit.       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 Current plan is to start chemotherapy and radiation on 05/22/16. He is having his PEG tube and port placed tomorrow. He has a history of significant alcohol use and Hepatitis C.   Currently in  Pain? No/denies            [facility] PT Assessment - 05/16/16 0001      Assessment   Medical Diagnosis left tonsil carcinoma   Referring Provider Dr. Lonie PeakSarah Squire     Precautions   Precautions Other (comment)   Precaution Comments cancer precautions     Restrictions   Weight Bearing Restrictions No     Balance Screen   Has the patient fallen in the past 6 months No   Has the patient had a decrease in activity level because of a fear of falling?  No   Is the patient reluctant to leave their home because of a fear of falling?  No     Home Environment   Living Environment Private residence   Living Arrangements Parent   Type of Home Apartment   Home Access Stairs to enter   Entrance Stairs-Number of Steps 1-2     Prior Function   Level of Independence Independent   Leisure rides bicycle for transportation about 20 minutes     Cognition   Overall Cognitive Status Within Functional Limits for tasks assessed     Posture/Postural Control   Posture Comments sligthly rounded shoulders     ROM / Strength   AROM / PROM / Strength AROM     AROM   Overall AROM Comments bilateral shoulder AROM WFL; neck range of  motion [facility] for extension and flexion, but 25% limited for bilateral rotation and 50% limited for bilateral sidebending     Transfers   Transfers Sit to Stand   Sit to Stand 7: Independent           LYMPHEDEMA/ONCOLOGY QUESTIONNAIRE - 05/16/16 1053      Lymphedema Assessments   Lymphedema Assessments Head and Neck     Head and Neck   4 cm superior to sternal notch around neck 35.7 cm   6 cm superior to sternal notch around neck 35.1 cm   8 cm superior to sternal notch around neck 35 cm   Other 10 cm superior to sternal notch 34.8 cm                        PT Education - 05/16/16 0927    Education provided Yes   Education Details educated on neck ROM exercises, walking program, Cure article, lymphedema, physical therapy information brochure    Person(s) Educated Patient   Methods Explanation;Demonstration;Handout   Comprehension Verbalized understanding                 Head and Neck Clinic Goals - 05/16/16 1016      Patient will be able to verbalize understanding of a home exercise program for cervical range of motion, posture, and walking.    Status Achieved     Patient will be able to verbalize understanding of proper sitting and standing posture.    Status Achieved     Patient will be able to verbalize understanding of lymphedema risk and availability of treatment for this condition.    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 He demonstrates limited cervical rotation and sidebending AROM and was unable to complete the 30 second sit to stand test due to chronic low back pain. He rides a bicycle as his primary means of transportation.    Rehab Potential Good   PT Treatment/Interventions ADLs/Self Care Home Management;Patient/family education   PT Next Visit Plan Currently has no therapy needs      Patient will benefit from skilled therapeutic intervention in order to improve the following deficits and impairments:  Decreased range of motion  Visit Diagnosis: Other symptoms and signs involving the musculoskeletal system     Problem List Patient Active Problem List   Diagnosis Date Noted  . Lesion of right lung 04/26/2016  . 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[place]) 04/24/2016  . History of hepatitis C 04/24/2016  . Chest pain 09/17/2014  . Anxiety   . Dyspnea 03/25/2014  . COPD GOLD II 07/25/2013  . Sinusitis, chronic 07/25/2013    Mellody Life,  SPT 05/16/2016, 10:55 AM  [place], [place], [address] Phone: [phone]   Fax:  [phone]  Name: [name] [name] MRN: [id number] Date of Birth: 1955-10-24  This entire session was guided, instructed, and directly supervised by Serafina Royals, PT.  Read, reviewed, edited and agree with student's findings and recommendations.   Serafina Royals, PT 05/16/16 11:12 AM

## 2016-05-17 ENCOUNTER — Other Ambulatory Visit: Payer: Self-pay | Admitting: Hematology and Oncology

## 2016-05-17 ENCOUNTER — Ambulatory Visit (HOSPITAL_COMMUNITY)
Admission: RE | Admit: 2016-05-17 | Discharge: 2016-05-17 | Disposition: A | Discharge status: Home / Self Care | Payer: Medicaid Other | Source: Ambulatory Visit | Attending: Hematology and Oncology | Admitting: Hematology and Oncology

## 2016-05-17 ENCOUNTER — Encounter (HOSPITAL_COMMUNITY): Payer: Self-pay

## 2016-05-17 ENCOUNTER — Other Ambulatory Visit: Payer: Self-pay

## 2016-05-17 ENCOUNTER — Other Ambulatory Visit: Payer: Self-pay | Admitting: Radiation Oncology

## 2016-05-17 DIAGNOSIS — Z87891 Personal history of nicotine dependence: Secondary | ICD-10-CM | POA: Diagnosis not present

## 2016-05-17 DIAGNOSIS — C090 Malignant neoplasm of tonsillar fossa: Secondary | ICD-10-CM

## 2016-05-17 DIAGNOSIS — Z8249 Family history of ischemic heart disease and other diseases of the circulatory system: Secondary | ICD-10-CM | POA: Insufficient documentation

## 2016-05-17 DIAGNOSIS — Z8719 Personal history of other diseases of the digestive system: Secondary | ICD-10-CM | POA: Insufficient documentation

## 2016-05-17 DIAGNOSIS — F419 Anxiety disorder, unspecified: Secondary | ICD-10-CM | POA: Diagnosis not present

## 2016-05-17 DIAGNOSIS — C099 Malignant neoplasm of tonsil, unspecified: Secondary | ICD-10-CM | POA: Diagnosis present

## 2016-05-17 DIAGNOSIS — B1920 Unspecified viral hepatitis C without hepatic coma: Secondary | ICD-10-CM | POA: Insufficient documentation

## 2016-05-17 DIAGNOSIS — Z833 Family history of diabetes mellitus: Secondary | ICD-10-CM | POA: Diagnosis not present

## 2016-05-17 DIAGNOSIS — C779 Secondary and unspecified malignant neoplasm of lymph node, unspecified: Secondary | ICD-10-CM | POA: Insufficient documentation

## 2016-05-17 DIAGNOSIS — J449 Chronic obstructive pulmonary disease, unspecified: Secondary | ICD-10-CM | POA: Diagnosis not present

## 2016-05-17 DIAGNOSIS — Z510 Encounter for antineoplastic radiation therapy: Secondary | ICD-10-CM | POA: Diagnosis not present

## 2016-05-17 HISTORY — PX: IR GENERIC HISTORICAL: IMG1180011

## 2016-05-17 LAB — CBC WITH DIFFERENTIAL/PLATELET
Basophils Absolute: 0 10*3/uL (ref 0.0–0.1)
Basophils Relative: 0 %
EOS PCT: 2 %
Eosinophils Absolute: 0.2 10*3/uL (ref 0.0–0.7)
HCT: 43.3 % (ref 39.0–52.0)
HEMOGLOBIN: 15.2 g/dL (ref 13.0–17.0)
LYMPHS ABS: 3.3 10*3/uL (ref 0.7–4.0)
LYMPHS PCT: 36 %
MCH: 31.8 pg (ref 26.0–34.0)
MCHC: 35.1 g/dL (ref 30.0–36.0)
MCV: 90.6 fL (ref 78.0–100.0)
Monocytes Absolute: 0.6 10*3/uL (ref 0.1–1.0)
Monocytes Relative: 7 %
Neutro Abs: 5 10*3/uL (ref 1.7–7.7)
Neutrophils Relative %: 55 %
PLATELETS: 273 10*3/uL (ref 150–400)
RBC: 4.78 MIL/uL (ref 4.22–5.81)
RDW: 13 % (ref 11.5–15.5)
WBC: 9.1 10*3/uL (ref 4.0–10.5)

## 2016-05-17 LAB — BASIC METABOLIC PANEL
Anion gap: 8 (ref 5–15)
BUN: 14 mg/dL (ref 6–20)
CHLORIDE: 105 mmol/L (ref 101–111)
CO2: 25 mmol/L (ref 22–32)
Calcium: 9.7 mg/dL (ref 8.9–10.3)
Creatinine, Ser: 0.72 mg/dL (ref 0.61–1.24)
GFR calc Af Amer: >60 mL/min (ref >60)
GFR calc non Af Amer: >60 mL/min (ref >60)
Glucose, Bld: 86 mg/dL (ref 65–99)
POTASSIUM: 4.3 mmol/L (ref 3.5–5.1)
SODIUM: 138 mmol/L (ref 135–145)

## 2016-05-17 LAB — PROTIME-INR
INR: 0.95
Prothrombin Time: 12.6 seconds (ref 11.4–15.2)

## 2016-05-17 LAB — APTT: aPTT: 33 seconds (ref 24–36)

## 2016-05-17 MED ORDER — GLUCAGON HCL RDNA (DIAGNOSTIC) 1 MG IJ SOLR
INTRAMUSCULAR | Status: AC
  Filled 2016-05-17: qty 1

## 2016-05-17 MED ORDER — FENTANYL CITRATE (PF) 100 MCG/2ML IJ SOLN
INTRAMUSCULAR | Status: AC | PRN
  Administered 2016-05-17 (×2): 50 ug via INTRAVENOUS

## 2016-05-17 MED ORDER — IOPAMIDOL (ISOVUE-300) INJECTION 61%
50.0000 mL | Freq: Once | INTRAVENOUS | Status: AC | PRN
  Administered 2016-05-17: 10 mL via INTRAVENOUS

## 2016-05-17 MED ORDER — GLUCAGON HCL RDNA (DIAGNOSTIC) 1 MG IJ SOLR
INTRAMUSCULAR | Status: AC | PRN
  Administered 2016-05-17: 1 mg via INTRAVENOUS

## 2016-05-17 MED ORDER — HEPARIN SOD (PORK) LOCK FLUSH 100 UNIT/ML IV SOLN
INTRAVENOUS | Status: AC | PRN
  Administered 2016-05-17: 500 [IU] via INTRAVENOUS

## 2016-05-17 MED ORDER — FENTANYL CITRATE (PF) 100 MCG/2ML IJ SOLN
INTRAMUSCULAR | Status: AC | PRN
  Administered 2016-05-17: 25 ug via INTRAVENOUS

## 2016-05-17 MED ORDER — LORAZEPAM 0.5 MG PO TABS: ORAL_TABLET | ORAL | 0 refills | Status: DC

## 2016-05-17 MED ORDER — CEFAZOLIN SODIUM-DEXTROSE 2-4 GM/100ML-% IV SOLN
2.0000 g | INTRAVENOUS | Status: AC
  Administered 2016-05-17: 2 g via INTRAVENOUS
  Filled 2016-05-17 (×2): qty 100

## 2016-05-17 MED ORDER — HYDROMORPHONE HCL 1 MG/ML IJ SOLN: 1.0000 mg | INTRAMUSCULAR | Status: DC | PRN

## 2016-05-17 MED ORDER — ONDANSETRON HCL 4 MG/2ML IJ SOLN: 4.0000 mg | INTRAMUSCULAR | Status: DC | PRN

## 2016-05-17 MED ORDER — HEPARIN SOD (PORK) LOCK FLUSH 100 UNIT/ML IV SOLN
INTRAVENOUS | Status: AC
  Filled 2016-05-17: qty 5

## 2016-05-17 MED ORDER — MIDAZOLAM HCL 2 MG/2ML IJ SOLN
INTRAMUSCULAR | Status: AC
  Filled 2016-05-17: qty 6

## 2016-05-17 MED ORDER — SODIUM CHLORIDE 0.9 % IV SOLN
INTRAVENOUS | Status: DC
  Administered 2016-05-17: 13:00:00 via INTRAVENOUS

## 2016-05-17 MED ORDER — MIDAZOLAM HCL 2 MG/2ML IJ SOLN
INTRAMUSCULAR | Status: AC | PRN
  Administered 2016-05-17 (×2): 1 mg via INTRAVENOUS

## 2016-05-17 MED ORDER — FENTANYL CITRATE (PF) 100 MCG/2ML IJ SOLN
INTRAMUSCULAR | Status: AC
  Filled 2016-05-17: qty 6

## 2016-05-17 MED ORDER — HYDROCODONE-ACETAMINOPHEN 5-325 MG PO TABS
1.0000 | ORAL_TABLET | ORAL | Status: DC | PRN
  Administered 2016-05-17: 1 via ORAL
  Filled 2016-05-17: qty 1

## 2016-05-17 MED ORDER — LIDOCAINE-EPINEPHRINE (PF) 2 %-1:200000 IJ SOLN
INTRAMUSCULAR | Status: AC
  Filled 2016-05-17: qty 20

## 2016-05-17 MED ORDER — LIDOCAINE-EPINEPHRINE (PF) 2 %-1:200000 IJ SOLN
INTRAMUSCULAR | Status: DC | PRN
  Administered 2016-05-17: 10 mL

## 2016-05-17 MED ORDER — LIDOCAINE HCL 1 % IJ SOLN
INTRAMUSCULAR | Status: AC
  Filled 2016-05-17: qty 20

## 2016-05-17 MED ORDER — MIDAZOLAM HCL 2 MG/2ML IJ SOLN
INTRAMUSCULAR | Status: AC | PRN
  Administered 2016-05-17: 1 mg via INTRAVENOUS

## 2016-05-17 MED FILL — LORazepam 0.5 MG TABS: 0.5 | 25 days supply | Qty: 50 | Fill #0

## 2016-05-17 NOTE — Sedation Documentation (Signed)
28mcg fentanyl IV given for pain per dr Laurence Ferrari

## 2016-05-17 NOTE — Procedures (Signed)
Interventional Radiology Procedure Note  Procedure:  1.) Placement of a right IJ approach single lumen PowerPort.  Tip is positioned at the superior cavoatrial junction and catheter is ready for immediate use.  2.) Placement of percutaneous 36F pull-through gastrostomy tube.  Complications: No immediate  Recommendations:  - Ok to shower tomorrow - Do not submerge for 7 days - Routine line care  - NPO except for sips and chips remainder of today and overnight - Maintain G-tube to LWS until tomorrow morning  - May advance diet as tolerated and begin using tube tomorrow morning  Signed,  Criselda Peaches, MD

## 2016-05-17 NOTE — Sedation Documentation (Signed)
Port a cath placement procedure in progress

## 2016-05-17 NOTE — Sedation Documentation (Signed)
Patient is resting comfortably. No complaints at this time 

## 2016-05-17 NOTE — Sedation Documentation (Signed)
MD at bedside. Vitals stable.

## 2016-05-17 NOTE — Discharge Instructions (Signed)
PEG Tube Home Guide A PEG tube is used to put food and fluids into the stomach. Before you leave the hospital, make sure that you know:  How to care for your PEG tube.  How to care for the opening (stoma) in your belly.  How to give yourself feedings and medicines.  When to call your doctor for help. HOW DO I CARE FOR MY PEG TUBE? Check your PEG tube every day. Make sure:  It is not too tight.  It is in the right place. There is a mark on the tube that shows when the tube is in the right place. Adjust the tube if you need to. HOW DO I CARE FOR MY STOMA? Clean your stoma every day. Follow these steps: 1. Wash your hands with soap and water. 2. Wash the stoma gently with warm, soapy water. 3. Rinse the stoma with warm water. 4. Pat the stoma area dry. Check your stoma for:  Redness.  Leaking.  Skin irritation. HOW DO I GIVE A FEEDING? Your doctor will tell you:  How much nutrition and fluid you will need for each feeding.  How often to have a feeding.  Whether you should take medicine in the tube by itself or with a feeding. To give yourself a feeding, follow these steps. 1. Lay out all of the things that you will need. 2. Make sure that the nutritional formula is at room temperature. 3. Wash your hands with soap and water. 4. Sit up or stand up straight. You will need to stay straight while you give yourself a feeding. 5. Make sure the syringe plunger is pushed in. Place the tip of the syringe in clear water, and slowly pull the plunger to bring (draw up) the water into the syringe. 6. Remove the clamp and the cap from the PEG tube. 7. Push the water out of the syringe to clean (flush) the tube. 8. If the tube is clear, draw up the formula into the syringe. Make sure to use the right amount for each feeding. Add water if you need to. 9. Slowly push the formula from the syringe through the tube. 10. After the feeding, flush the tube with water. 11. Put the clamp and the  cap on the tube. 12. Stay sitting up or standing up straight for at least 30 minutes. HOW DO I GIVE MEDICINE? To give yourself medicine, follow these steps: 1. Lay out all of the things that you will need. 2. If your medicine is in tablet form, crush it and dissolve it in water. 3. Wash your hands with soap and water. 4. Sit up or stand up straight. You will need to stay straight while you give yourself medicine. 5. Make sure the syringe plunger is pushed in. Place the tip of the syringe in clear water, and slowly pull the plunger to bring (draw up) the water into the syringe. 6. Remove the clamp and the cap from the PEG tube. 7. Push the water out of the syringe to clean (flush) the tube. 8. If the tube is clear, draw up the medicine into the syringe. 9. Slowly push the medicine from the syringe through the tube. 10. Flush the tube with water. 11. Put the clamp and the cap on the tube. 12. Stay sitting up or standing up straight for at least 30 minutes. You should not take sustained release (SR) medicines through your tube. If you are not sure if your medicine is a SR medicine, ask your  doctor or pharmacist. GET HELP IF:  The area around your stoma is sore, irritated, or red.  You have belly pain or bloating while you are feeding or afterward.  You have a feeling of being sick to your stomach (nausea) that does not go away.  You cannot poop (constipated) or you have watery poop (diarrhea) for a long time.  You have a fever.  You have problems with your PEG tube. GET HELP RIGHT AWAY IF:  Your tube is blocked.  Your tube falls out.  You have pain around your tube.  You are bleeding from your tube.  Your tube is leaking.  You choke or you have trouble breathing while you are feeding or afterward.   This information is not intended to replace advice given to you by your health care provider. Make sure you discuss any questions you have with your health care provider.     Document Released: 09/23/2010 Document Revised: 01/05/2015 Document Reviewed: 08/26/2014 Elsevier Interactive Patient Education 2016 [place] of a Feeding Tube Feeding tubes are often given to those who have trouble swallowing or cannot take food or medicine. A feeding tube can:   Go into the nose and down to the stomach.  Go through the skin in the belly (abdomen) and into the stomach or small bowel. SUPPLIES NEEDED TO CARE FOR THE TUBE SITE  Clean gloves.  Clean wash cloth, gauze pads, or soft paper towel.  Cotton swabs.  Skin barrier ointment or cream.  Soap and water.  Precut foam pads or gauze (that go around the tube).  Tube tape. TUBE SITE CARE 5. Have all supplies ready. 6. Wash hands well. 7.  Put on clean gloves. 8. Remove dirty foam pads or gauze near the tube site, if present. 9. Check the skin around the tube site for redness, rash, puffiness (swelling), leaking fluid, or extra tissue growth. Call your doctor if you see any of these. 10. Wet the gauze and cotton swabs with water and soap. 11. Wipe the area closest to the tube with cotton swabs. Wipe the surrounding skin with moistened gauze. Rinse with water. 12. Dry the skin and tube site with a dry gauze pad or soft paper towel. Do not use antibiotic ointments at the tube site. 13. If the skin is red, apply petroleum jelly in a circular motion, using a cotton swab. Your doctor may suggest a different cream or ointment. Use what the doctor suggests. 14. Apply a new pre-cut foam pad or gauze around the tube. Tape the edges down. Foam pads or gauze may be left off if there is no fluid at the tube site. 15. Use tape or a device that will attach your feeding tube to your skin or do as directed. Rotate where you tape the tube. 16. Sit the person up. 50. Throw away used supplies. 18. Remove gloves. 19. Wash hands. SUPPLIES NEEDED TO FLUSH A FEEDING TUBE  Clean gloves.  60 mL syringe (that connects to  feeding tube).  Towel.  Water. FLUSHING A FEEDING TUBE  1. Have all supplies ready. 2. Wash hands well. 3. Put on clean gloves. 4. Pull 30 mL of water into the syringe. 5. Bend (kink) the feeding tube while disconnecting it from the feeding-bag tubing or while removing the plug at the end of the tube. 6. Insert the tip of the syringe into the end of the feeding tube. Stop bending the tube. Slowly inject the water. 7. If you cannot inject the  water, the person with the feeding tube should lay on their left side. 8. After injecting the water, remove the syringe. 9. Always flush the tube before giving the first medicine, between medicines, and after the final medicine before starting a feeding. 10. Throw away used supplies. 11. Remove gloves. 12. Wash hands.   This information is not intended to replace advice given to you by your health care provider. Make sure you discuss any questions you have with your health care provider.   Document Released: 05/15/2012 Document Reviewed: 05/15/2012 Elsevier Interactive Patient Education 2016 [place] An implanted port is a type of central line that is placed under the skin. Central lines are used to provide IV access when treatment or nutrition needs to be given through a person's veins. Implanted ports are used for long-term IV access. An implanted port may be placed because:   You need IV medicine that would be irritating to the small veins in your hands or arms.   You need long-term IV medicines, such as antibiotics.   You need IV nutrition for a long period.   You need frequent blood draws for lab tests.   You need dialysis.  Implanted ports are usually placed in the chest area, but they can also be placed in the upper arm, the abdomen, or the leg. An implanted port has two main parts:   Reservoir. The reservoir is round and will appear as a small, raised area under your skin. The reservoir is the part  where a needle is inserted to give medicines or draw blood.   Catheter. The catheter is a thin, flexible tube that extends from the reservoir. The catheter is placed into a large vein. Medicine that is inserted into the reservoir goes into the catheter and then into the vein.  HOW WILL I CARE FOR MY INCISION SITE? Do not get the incision site wet. Bathe or shower as directed by your health care provider.  HOW IS MY PORT ACCESSED? Special steps must be taken to access the port:   Before the port is accessed, a numbing cream can be placed on the skin. This helps numb the skin over the port site.   Your health care provider uses a sterile technique to access the port.  Your health care provider must put on a mask and sterile gloves.  The skin over your port is cleaned carefully with an antiseptic and allowed to dry.  The port is gently pinched between sterile gloves, and a needle is inserted into the port.  Only "non-coring" port needles should be used to access the port. Once the port is accessed, a blood return should be checked. This helps ensure that the port is in the vein and is not clogged.   If your port needs to remain accessed for a constant infusion, a clear (transparent) bandage will be placed over the needle site. The bandage and needle will need to be changed every week, or as directed by your health care provider.   Keep the bandage covering the needle clean and dry. Do not get it wet. Follow your health care provider's instructions on how to take a shower or bath while the port is accessed.   If your port does not need to stay accessed, no bandage is needed over the port.  WHAT IS FLUSHING? Flushing helps keep the port from getting clogged. Follow your health care provider's instructions on how and when to flush the port. Ports are  usually flushed with saline solution or a medicine called heparin. The need for flushing will depend on how the port is used.   If the port  is used for intermittent medicines or blood draws, the port will need to be flushed:   After medicines have been given.   After blood has been drawn.   As part of routine maintenance.   If a constant infusion is running, the port may not need to be flushed.  HOW LONG WILL MY PORT STAY IMPLANTED? The port can stay in for as long as your health care provider thinks it is needed. When it is time for the port to come out, surgery will be done to remove it. The procedure is similar to the one performed when the port was put in.  WHEN SHOULD I SEEK IMMEDIATE MEDICAL CARE? When you have an implanted port, you should seek immediate medical care if:   You notice a bad smell coming from the incision site.   You have swelling, redness, or drainage at the incision site.   You have more swelling or pain at the port site or the surrounding area.   You have a fever that is not controlled with medicine.   This information is not intended to replace advice given to you by your health care provider. Make sure you discuss any questions you have with your health care provider.   Document Released: 08/21/2005 Document Revised: 06/11/2013 Document Reviewed: 04/28/2013 Elsevier Interactive Patient Education 2016 [place] Insertion, Care After Refer to this sheet in the next few weeks. These instructions provide you with information on caring for yourself after your procedure. Your health care provider may also give you more specific instructions. Your treatment has been planned according to current medical practices, but problems sometimes occur. Call your health care provider if you have any problems or questions after your procedure. WHAT TO EXPECT AFTER THE PROCEDURE After your procedure, it is typical to have the following:   Discomfort at the port insertion site. Ice packs to the area will help.  Bruising on the skin over the port. This will subside in 3-4 days. HOME CARE  INSTRUCTIONS  After your port is placed, you will get a manufacturer's information card. The card has information about your port. Keep this card with you at all times.   Know what kind of port you have. There are many types of ports available.   Wear a medical alert bracelet in case of an emergency. This can help alert health care workers that you have a port.   The port can stay in for as long as your health care provider believes it is necessary.   A home health care nurse may give medicines and take care of the port.   You or a family member can get special training and directions for giving medicine and taking care of the port at home.  SEEK MEDICAL CARE IF:   Your port does not flush or you are unable to get a blood return.   You have a fever or chills. SEEK IMMEDIATE MEDICAL CARE IF:  You have new fluid or pus coming from your incision.   You notice a bad smell coming from your incision site.   You have swelling, pain, or more redness at the incision or port site.   You have chest pain or shortness of breath.   This information is not intended to replace advice given to you by your health care  provider. Make sure you discuss any questions you have with your health care provider.   Document Released: 06/11/2013 Document Revised: 08/26/2013 Document Reviewed: 06/11/2013 Elsevier Interactive Patient Education 2016 Elsevier Inc[employer] Moderate Conscious Sedation, Adult, Care After Refer to this sheet in the next few weeks. These instructions provide you with information on caring for yourself after your procedure. Your health care provider may also give you more specific instructions. Your treatment has been planned according to current medical practices, but problems sometimes occur. Call your health care provider if you have any problems or questions after your procedure. WHAT TO EXPECT AFTER THE PROCEDURE  After your procedure:  You may feel sleepy, clumsy, and have  poor balance for several hours.  Vomiting may occur if you eat too soon after the procedure. HOME CARE INSTRUCTIONS  Do not participate in any activities where you could become injured for at least 24 hours. Do not:  Drive.  Swim.  Ride a bicycle.  Operate heavy machinery.  Cook.  Use power tools.  Climb ladders.  Work from a high place.  Do not make important decisions or sign legal documents until you are improved.  If you vomit, drink water, juice, or soup when you can drink without vomiting. Make sure you have little or no nausea before eating solid foods.  Only take over-the-counter or prescription medicines for pain, discomfort, or fever as directed by your health care provider.  Make sure you and your family fully understand everything about the medicines given to you, including what side effects may occur.  You should not drink alcohol, take sleeping pills, or take medicines that cause drowsiness for at least 24 hours.  If you smoke, do not smoke without supervision.  If you are feeling better, you may resume normal activities 24 hours after you were sedated.  Keep all appointments with your health care provider. SEEK MEDICAL CARE IF:  Your skin is pale or bluish in color.  You continue to feel nauseous or vomit.  Your pain is getting worse and is not helped by medicine.  You have bleeding or swelling.  You are still sleepy or feeling clumsy after 24 hours. SEEK IMMEDIATE MEDICAL CARE IF:  You develop a rash.  You have difficulty breathing.  You develop any type of allergic problem.  You have a fever. MAKE SURE YOU:  Understand these instructions.  Will watch your condition.  Will get help right away if you are not doing well or get worse.   This information is not intended to replace advice given to you by your health care provider. Make sure you discuss any questions you have with your health care provider.   Document Released: 06/11/2013  Document Revised: 09/11/2014 Document Reviewed: 06/11/2013 Elsevier Interactive Patient Education [employer].

## 2016-05-17 NOTE — Consult Note (Signed)
Chief Complaint: Patient was seen in consultation today for Port-A-Cath and percutaneous gastrostomy tube placements  Referring Physician(s): [place]  Supervising Physician: Jacqulynn Cadet  Patient Status: Outpatient  History of Present Illness: [name] [name] is a 60 y.o. 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 He presents today for Port-A-Cath and percutaneous gastrostomy tube placements prior to planned chemoradiation.  Past Medical History:  Diagnosis Date  . Anxiety   . Cancer ([place]) 04/04/2016   cancer of left tonsil  . COPD (chronic obstructive pulmonary disease) ([place])   . Hepatitis   . Hepatitis C    Treatment x 8 weeks 6-7 months ago Har[facility]  . Sinus congestion   . Stomach ulcer     Past Surgical History:  Procedure Laterality Date  . COLONOSCOPY WITH PROPOFOL N/A 04/14/2015   Procedure: COLONOSCOPY WITH PROPOFOL;  Surgeon: Arta Silence, MD;  Location: [facility]COPY;  Service: Endoscopy;  Laterality: N/A;  . HERNIA REPAIR    . MULTIPLE EXTRACTIONS WITH ALVEOLOPLASTY N/A 05/03/2016   Procedure: Multiple extractions with alveoloplasty and gross debridement of remaining teeth.;  Surgeon: Lenn Cal, DDS;  Location: [facility] ORS;  Service: Oral Surgery;  Laterality: N/A;  . STOMACH SURGERY     20 years    Allergies: Review of patient's allergies indicates no known allergies.  Medications: Prior to Admission medications   Medication Sig Start Date End Date Taking? Authorizing Provider  HYDROcodone-acetaminophen (NORCO) 5-325 MG tablet Take 1-2 tablets by mouth every 6 (six) hours as needed for moderate pain or severe pain. 05/03/16   Lenn Cal, DDS  ibuprofen (ADVIL,MOTRIN) 200 MG tablet Take 400 mg by mouth every 6 (six) hours as needed for fever, headache, mild pain, moderate pain or cramping.     Historical Provider, MD  LORazepam  (ATIVAN) 0.5 MG tablet Take 1 tablet up to BID PRN nausea or anxiety.  Can be taken 30 min before radiotherapy for anxiety. 05/17/16   Eppie Gibson, MD  montelukast (SINGULAIR) 10 MG tablet Take 1 tablet (10 mg total) by mouth at bedtime. 05/09/16   Heath Lark, MD  sodium fluoride (FLUORISHIELD) 1.1 % GEL dental gel Instill one drop of gel per tooth space of fluoride tray. Place over teeth for 5 minutes. Remove. Spit out excess. Repeat nightly. 05/15/16   Lenn Cal, DDS     Family History  Problem Relation Age of Onset  . Hypertension Mother   . Anxiety disorder Mother   . Diabetes Mother     Social History   Social History  . Marital status: Single    Spouse name: N/A  . Number of children: 3  . Years of education: N/A   Occupational History  . disabled    Social History Main Topics  . Smoking status: Former Smoker    Packs/day: 0.50    Years: 30.00    Types: Cigarettes    Quit date: 01/03/2016  . Smokeless tobacco: Never Used  . Alcohol use No     Comment: quit about 3 months ago, drank beer (he tells me a lot of beer)  . Drug use: No  . Sexual activity: Yes   Other Topics Concern  . Not on file   Social History Narrative   Lives with mother.    Mother is disabled with mental illness. He is the principal care giver   Has 3 children.  [name] is an important contact   He does not drive. Takes a bus. Ride a bike.      Review of Systems currently denies fever, dyspnea, dysphagia, cough, abdominal/back pain, nausea, vomiting or abnormal bleeding. He does have occasional headaches and intermittent left upper chest discomfort.  Vital Signs: Blood pressure 119/77, temp 99.2, heart rate 68, O2 sat 100% room air  Physical Exam patient awake, alert. Chest clear to auscultation bilaterally. Heart with regular rate and rhythm. Abdomen soft, positive bowel sounds, nontender; lower extremities with no edema; left tonsillar mass and palpable nontender left cervical  adenopathy  Mallampati Score:     Imaging: Nm Pet Image Initial (pi) Skull Base To Thigh  Result Date: 04/21/2016 CLINICAL DATA:  Initial treatment strategy for left tonsillar squamous cell carcinoma. EXAM: NUCLEAR MEDICINE PET SKULL BASE TO THIGH TECHNIQUE: 7.7 mCi F-18 FDG was injected intravenously. Full-ring PET imaging was performed from the skull base to thigh after the radiotracer. CT data was obtained and used for attenuation correction and anatomic localization. FASTING BLOOD GLUCOSE:  Value: 103 mg/dl COMPARISON:  Chest CT on 10/06/2014 FINDINGS: NECK Asymmetric soft tissue prominence in the left palatine tonsil shows hyper metabolism, with SUV max of 29.9. This is consistent with known primary left tonsillar squamous cell carcinoma. Left level 2 lymphadenopathy is seen measuring 1.5 cm on image 25/4, which is hypermetabolic with SUV max of [id number]. This is consistent with lymph node metastasis. No other hypermetabolic cervical lymphadenopathy identified. CHEST No hypermetabolic lymph nodes within the thorax. Ground-glass and part solid nodular opacity in the right lung apex measures 14 mm on image 8/series 8. This is unchanged compared to previous CT on 10/06/2014, however this shows mild hypermetabolic activity with SUV max of 1.5. No other pulmonary nodules or masses identified. No evidence of pleural effusion. ABDOMEN/PELVIS No abnormal hypermetabolic activity within the liver, pancreas, adrenal glands, or spleen. No hypermetabolic lymph nodes in the abdomen or pelvis. SKELETON No focal hypermetabolic activity to suggest skeletal metastasis. IMPRESSION: Asymmetric hypermetabolic soft tissue prominence in left palatine tonsil, consistent with known primary left tonsillar squamous cell carcinoma. Left level 2 cervical lymphadenopathy, consistent with metastatic disease. No evidence of metastatic disease within the chest, abdomen, or pelvis. Stable 14 mm ground-glass and part solid nodular opacity  in right lung apex compared to previous CT in 2016. This shows low-grade metabolic activity. Low-grade bronchogenic adenocarcinoma cannot be excluded. Consider surgical resection versus continued followup by CT in 12 months. This recommendation follows the consensus statement: Guidelines for Management of Small Pulmonary Nodules Detected on CT Images: From the Fleischner Society 2017; published online before print (10.1148/radiol.IJ:2314499). Electronically Signed   By: Earle Gell M.D.   On: 04/21/2016 12:27    Labs:  CBC:  Recent Labs  11/18/15 0500 03/21/16 0828 04/04/16 0655 04/27/16 1155 05/17/16 1250  WBC 9.1 8.6  --  8.7 9.1  HGB 15.6 16.2 15.3 15.3 15.2  HCT 44.5 47.0 45.0 44.6 43.3  PLT 210 245  --  235 273    COAGS:  Recent Labs  05/27/15 1932  INR 1.04  APTT 25    BMP:  Recent Labs  08/31/15 1934 11/18/15 0500 03/21/16 0828 04/04/16 0655 04/27/16 1155  NA 139 138 138 142 140  K 4.3 4.9 4.9 4.0 4.4  CL 103 103 104 104 107  CO2 25 24 27   --  28  GLUCOSE 80 80 96 96 90  BUN 7 18 8 14 12   CALCIUM 9.5 9.0  9.9  --  9.9  CREATININE 0.84 0.93 0.84 0.70 0.74  GFRNONAA >60 >60 >60  --  >60  GFRAA >60 >60 >60  --  >60    LIVER FUNCTION TESTS:  Recent Labs  05/27/15 1932 08/31/15 1934 03/21/16 0828 04/27/16 1155  BILITOT 1.0 1.4* 0.7 0.7  AST 35 50* 25 27  ALT 26 27 18 21   ALKPHOS 51 52 47 52  PROT 7.7 6.8 7.2 8.3*  ALBUMIN 4.4 4.0 4.2 4.9    TUMOR MARKERS: No results for input(s): AFPTM, CEA, CA199, CHROMGRNA in the last 8760 hours.  Assessment and Plan: 60 y.o. 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 He presents today for Port-A-Cath and percutaneous gastrostomy tube placements prior to planned chemoradiation. Details/risks of procedures, including but not limited to, internal bleeding, venous thrombosis, infection, injury to adjacent  structures discussed with patient with his understanding and consent.   Thank you for this interesting consult.  I greatly enjoyed meeting [name] [name] and look forward to participating in their care.  A copy of this report was sent to the requesting provider on this date.  Electronically Signed: D. Rowe Robert 05/17/2016, 1:00 PM   I spent a total of 30 minutes face to face with patient counseling/coordinating care for Port-A-Cath and gastrostomy tube placements

## 2016-05-17 NOTE — Sedation Documentation (Signed)
Patient is resting comfortably. No complaints at this time, MD at bedside

## 2016-05-17 NOTE — Sedation Documentation (Signed)
Pt sedated for previous procedure

## 2016-05-17 NOTE — Sedation Documentation (Signed)
Patient is resting comfortably. 

## 2016-05-17 NOTE — Sedation Documentation (Signed)
Pt tolerated both procedures well.

## 2016-05-17 NOTE — Sedation Documentation (Addendum)
Pt tolerated procedure well. Will continue to monitor pt for g tube placement

## 2016-05-17 NOTE — Progress Notes (Signed)
Patient ID: [name] [name], male   DOB: 03-29-1956, 60 y.o.   MRN: [id number] Prescription given to patient for Percocet 5/325, #15, no refills, 1-2 tablets every 4-6 hours for moderate to severe pain post-gastrostomy tube placement

## 2016-05-17 NOTE — Sedation Documentation (Signed)
Vital signs stable. No complaints from pt at this time.  

## 2016-05-17 NOTE — Therapy (Signed)
[facility] [address] [place], [place], [address] Phone: [phone]   Fax:  [phone]  Speech Language Pathology Evaluation  Patient Details  Name: [name] [name] MRN: [id number] Date of Birth: 15-Dec-1955 Referring Provider: Eppie Gibson, M.D.  Encounter Date: 05/16/2016      End of Session - 05/16/16 1426    Visit Number 1   Number of Visits 6   Date for SLP Re-Evaluation 11/14/16   SLP Start Time [id number]   SLP Stop Time  1057   SLP Time Calculation (min) 42 min   Activity Tolerance Patient tolerated treatment well      Past Medical History:  Diagnosis Date  . Anxiety   . Cancer ([place]) 04/04/2016   cancer of left tonsil  . COPD (chronic obstructive pulmonary disease) ([facility])   . Hepatitis   . Hepatitis C    Treatment x 8 weeks 6-7 months ago [facility]  . Sinus congestion   . Stomach ulcer     Past Surgical History:  Procedure Laterality Date  . COLONOSCOPY WITH PROPOFOL N/A 04/14/2015   Procedure: COLONOSCOPY WITH PROPOFOL;  Surgeon: Arta Silence, MD;  Location: [facility];  Service: Endoscopy;  Laterality: N/A;  . HERNIA REPAIR    . MULTIPLE EXTRACTIONS WITH ALVEOLOPLASTY N/A 05/03/2016   Procedure: Multiple extractions with alveoloplasty and gross debridement of remaining teeth.;  Surgeon: Lenn Cal, DDS;  Location: [facility];  Service: Oral Surgery;  Laterality: N/A;  . STOMACH SURGERY     20 years    There were no vitals filed for this visit.      Subjective Assessment - 05/16/16 1028    Subjective "I just want to learn (about the treatment)."   Currently in Pain? No/denies            SLP Evaluation [facility] - 05/16/16 1028      SLP Visit Information   SLP Received On 05/16/16   Referring Provider Eppie Gibson, M.D.   Medical Diagnosis Tonsilar CA     General Information   HPI Pt with chemo and rad beginning 05-22-16, PEG placed tomorrow.      Prior Functional Status    Cognitive/Linguistic Baseline Within functional limits     Cognition   Overall Cognitive Status Within Functional Limits for tasks assessed     Auditory Comprehension   Overall Auditory Comprehension Appears within functional limits for tasks assessed     Verbal Expression   Overall Verbal Expression Appears within functional limits for tasks assessed     Oral Motor/Sensory Function   Overall Oral Motor/Sensory Function Appears within functional limits for tasks assessed     Motor Speech   Overall Motor Speech Appears within functional limits for tasks assessed     Pt currently tolerates regular diet and thin liquids. He coughs and clears throat WNL. Dentition is adequate. POs: Pt ate [place] sandwich and drank H2O without overt s/s aspiration. Thyroid elevation appeared WNL, and swallows appeared timely. Oral residue noted as WNL/minimal. Pt's swallow deemed WNL at this time.   Because data states the risk for dysphagia during and after radiation treatment is high due to undergoing radiation tx, SLP taught pt about the possibility of reduced/limited ability for PO intake during rad tx. SLP encouraged pt to continue swallowing POs as far into rad tx as possible, even ingesting POs and/or completing HEP shortly after administration of pain meds.   SLP educated pt re: changes to swallowing musculature after rad tx,  and why adherence to dysphagia HEP provided today and PO consumption was necessary to inhibit muscular disuse atrophy and to reduce muscle fibrosis following rad tx. Pt demonstrated understanding of these things to SLP.   SLP then developed a HEP for pt and pt was instructed how to perform exercises involving lingual, vocal, and pharyngeal strengthening. SLP performed each exercise and pt return demonstrated each exercise. SLP ensured pt performance was correct prior to moving on to next exercise. Pt was instructed to complete this program 2-3 times a day,until 6 months after their  last rad tx, then x2 a week after that.                       SLP Short Term Goals - 05/16/16 1428      SLP SHORT TERM GOAL #1   Title pt will complete HEP for dysphagia with rare min A   Baseline total A   Time 2   Period --  therapy visits (3 total sessions, all STGs)   Status New     SLP SHORT TERM GOAL #2   Title pt will tell SLP why he is completing HEP   Baseline total A   Time 2   Period --  visits   Status New     SLP SHORT TERM GOAL #3   Title pt will tell s/s to indicate possible difficulty with aspiration   Baseline not provided yet   Time 2   Period --  visits   Status New          SLP Long Term Goals - 05/16/16 1430      SLP LONG TERM GOAL #1   Title pt will complete HEP with independence over two sessions   Baseline total A   Time 4   Period --  therapy visits (5 total sessions)   Status New     SLP LONG TERM GOAL #2   Title pt will tell SLP why a food journal can A pt in returning to regular diet after radiation tx   Baseline not provided yet   Time 4   Period --  therapy visits   Status New          Plan - 05/16/16 1426    Clinical Impression Statement Pt presents with swallowing WNL. The incidence of dysphagia is high with pt receiving head/neck radiation however, so pt would benefit from skilled ST to assess safety with POs, and proper procedure with HEP provided today.   Speech Therapy Frequency --  once approx every four weeks   Duration --  6 total therapy visits   Treatment/Interventions Aspiration precaution training;Pharyngeal strengthening exercises;Diet toleration management by SLP;Compensatory techniques;Internal/external aids;SLP instruction and feedback;Patient/family education;Trials of upgraded texture/liquids   Potential to Achieve Goals Good      Patient will benefit from skilled therapeutic intervention in order to improve the following deficits and impairments:   Dysphagia    Problem  List Patient Active Problem List   Diagnosis Date Noted  . Lesion of right lung 04/26/2016  . 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[facility]) 04/24/2016  . History of hepatitis C 04/24/2016  . Chest pain 09/17/2014  . Anxiety   . Dyspnea 03/25/2014  . COPD GOLD II 07/25/2013  . Sinusitis, chronic 07/25/2013    [facility] 05/17/2016, 2:33 PM  [facility] [address] [place], [place], [address] Phone: [phone]   Fax:  [phone]  Name: [name] Olcott  [name] MRN: [id number] Date of Birth: 11/26/1955

## 2016-05-17 NOTE — Sedation Documentation (Signed)
Patient is resting comfortably. Vitals stable. 

## 2016-05-17 NOTE — Sedation Documentation (Signed)
Vital signs stable. 

## 2016-05-18 ENCOUNTER — Encounter: Payer: Self-pay | Admitting: *Deleted

## 2016-05-18 ENCOUNTER — Telehealth: Payer: Self-pay | Admitting: Hematology and Oncology

## 2016-05-18 ENCOUNTER — Ambulatory Visit (HOSPITAL_BASED_OUTPATIENT_CLINIC_OR_DEPARTMENT_OTHER): Payer: No Typology Code available for payment source | Admitting: Hematology and Oncology

## 2016-05-18 ENCOUNTER — Other Ambulatory Visit: Payer: No Typology Code available for payment source

## 2016-05-18 VITALS — BP 111/84 | HR 75 | Temp 98.5°F | Resp 18 | Wt 156.7 lb

## 2016-05-18 DIAGNOSIS — Z931 Gastrostomy status: Secondary | ICD-10-CM | POA: Insufficient documentation

## 2016-05-18 DIAGNOSIS — G893 Neoplasm related pain (acute) (chronic): Secondary | ICD-10-CM

## 2016-05-18 DIAGNOSIS — R070 Pain in throat: Secondary | ICD-10-CM | POA: Insufficient documentation

## 2016-05-18 DIAGNOSIS — Z23 Encounter for immunization: Secondary | ICD-10-CM

## 2016-05-18 DIAGNOSIS — C090 Malignant neoplasm of tonsillar fossa: Secondary | ICD-10-CM

## 2016-05-18 MED ORDER — OXYCODONE-ACETAMINOPHEN 5-325 MG PO TABS
2.0000 | ORAL_TABLET | Freq: Once | ORAL | Status: AC
  Administered 2016-05-18: 2 via ORAL

## 2016-05-18 MED ORDER — ONDANSETRON HCL 8 MG PO TABS: 8.0000 mg | ORAL_TABLET | Freq: Three times a day (TID) | ORAL | 1 refills | Status: DC | PRN

## 2016-05-18 MED ORDER — PROCHLORPERAZINE MALEATE 10 MG PO TABS: 10.0000 mg | ORAL_TABLET | Freq: Four times a day (QID) | ORAL | 1 refills | Status: DC | PRN

## 2016-05-18 MED ORDER — OXYCODONE-ACETAMINOPHEN 5-325 MG PO TABS
ORAL_TABLET | ORAL | Status: AC
  Filled 2016-05-18: qty 2

## 2016-05-18 MED ORDER — FENTANYL 25 MCG/HR TD PT72: 25.0000 ug | MEDICATED_PATCH | TRANSDERMAL | 0 refills | Status: DC

## 2016-05-18 MED ORDER — MORPHINE SULFATE (CONCENTRATE) 10 MG /0.5 ML PO SOLN: 10.0000 mg | ORAL | 0 refills | Status: DC | PRN

## 2016-05-18 MED ORDER — LIDOCAINE-PRILOCAINE 2.5-2.5 % EX CREA: TOPICAL_CREAM | CUTANEOUS | 3 refills | Status: DC

## 2016-05-18 MED ORDER — INFLUENZA VAC SPLIT QUAD 0.5 ML IM SUSY
0.5000 mL | PREFILLED_SYRINGE | Freq: Once | INTRAMUSCULAR | Status: AC
  Administered 2016-05-18: 0.5 mL via INTRAMUSCULAR
  Filled 2016-05-18: qty 0.5

## 2016-05-18 MED FILL — fentaNYL 25 MCG/HR PT72: 25 | 15 days supply | Qty: 5 | Fill #0

## 2016-05-18 MED FILL — PROCHLORPERAZINE 10 MG TAB: 10 | 15 days supply | Qty: 60 | Fill #0

## 2016-05-18 MED FILL — MORPHINE SULF 100 MG/5 ML S: 100 | 40 days supply | Qty: 240 | Fill #0

## 2016-05-18 MED FILL — LIDOCAINE-PRILOCAINE CREAM: 2.5-2.5 | 30 days supply | Qty: 30 | Fill #0

## 2016-05-18 MED FILL — ONDANSETRON HCL 8 MG TABLET: 8 | 20 days supply | Qty: 60 | Fill #0

## 2016-05-18 NOTE — Assessment & Plan Note (Signed)
The patient has mild pain and nausea after procedure. Unfortunately, he was not able to fill his prescription yesterday. We discussed liquid diet, pain management and medication for nausea

## 2016-05-18 NOTE — Assessment & Plan Note (Signed)
The patient has mild pain after recent procedure and I anticipate he would need pain medicine in the future. We discussed narcotic refill policy. I want him potential side effects of pain medication. He will start liquid pain medicine this weekend for local pain control

## 2016-05-18 NOTE — Assessment & Plan Note (Signed)
We discussed the role of chemotherapy. The intent is for cure.  We discussed some of the risks, benefits, side-effects of cisplatin. Some of the short term side-effects included, though not limited to, including weight loss, life threatening infections, risk of allergic reactions, need for transfusions of blood products, nausea, vomiting, change in bowel habits, loss of hair, admission to hospital for various reasons, and risks of death.   Long term side-effects are also discussed including risks of infertility, permanent damage to nerve function, hearing loss, chronic fatigue, kidney damage with possibility needing hemodialysis, and rare secondary malignancy including bone marrow disorders.  The patient is aware that the response rates discussed earlier is not guaranteed.  After a long discussion, patient made an informed decision to proceed with the prescribed plan of care and went ahead to sign the consent form today.   Patient education material was dispensed.  Treatment decision is based on NCCN guidelines, 100 mg/m2 every 3 weeks for total of 3 cycles with concurrent radiation treatment. 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 Burt Knack, M.D., Lucille Passy. Georjean Mode, Ph.D., Arlene A. Jasper Loser, M.D., Blase Mess, M.D., Darrick Penna. Megan Salon, M.D., [place] Jonna Munro, M.D., Domingo Cocking. Lyndon Code, M.D., Sallye Lat, M.D., Luciano Cutter, M.D., Polly Cobia, M.D., Minerva Ends, M.D., Sandre Kitty, M.D., [place]. Mariane Duval, M.D., Trinidad Curet, M.D., Meryl Dare, M.D., and Danae Orleans. Fu, M.D. for the [facility] [employer] 2004; [id number], 2004DOI: [id number]  The patient will get 2 additional days after each cisplatin dose along with IV dexamethasone as a preemptive treatment against risk of excessive dehydration and renal failure He will be  seeing you on a weekly basis of supportive care I provided him prescription for anti-emetics, prescription for pain medicine as well as prescription EMLA cream

## 2016-05-18 NOTE — Progress Notes (Signed)
Pt in lobby prior to chemo class w/ c/o pain 7/10 at PEG tube insertion site.  He was unable to get his Rx filled due to he could not afford it and Rx needs to be from [facility] to be covered by his [facility].   Dr. Alvy Bimler gave order to give pt 2 percocet now before his chemo class.  She will assess his pain and give him a Rx when he comes in for office visit after his chemo class.

## 2016-05-18 NOTE — Progress Notes (Signed)
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 Chronic small-vessel ischemic changes of the cerebral hemispheric white matter.       04/04/2016 Imaging    CT neck showed left tonsillar mass with diameter of 19 x 24 mm consistent with 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 necrosis. Suspicious level 5/supraclavicular nodes on the left. Scar type density in the right upper lobe is more prominent than was seen on a CT scan of the chest 10/06/2014      04/21/2016 PET scan    Asymmetric hypermetabolic soft tissue prominence in left palatine tonsil, consistent with known primary left tonsillar squamous cell Carcinoma. Left level 2 cervical lymphadenopathy, consistent with metastatic disease. No evidence of metastatic disease within the chest, abdomen, or pelvis. Stable 14 mm ground-glass and part solid nodular opacity in right lung apex compared to previous CT in 2016. This shows low-grade metabolic activity. Low-grade bronchogenic adenocarcinoma cannot be excluded. Consider surgical resection versus continued followup by CT in 12 months      05/17/2016 Procedure    Successful placement of a 20 French pull  through gastrostomy tube & right IJ approach Power [place].       INTERVAL HISTORY: Please see below for problem oriented charting. He returns today for chemotherapy consent and for pain management. Unfortunately, he was not able to fill his prescription for pain medicine yesterday and had a lot of incisional pain along with some nausea.  REVIEW OF SYSTEMS:   Constitutional: Denies fevers, chills or abnormal weight loss Eyes: Denies blurriness of vision Ears, nose, mouth, throat, and face: Denies mucositis or sore throat Respiratory: Denies cough, dyspnea or wheezes Cardiovascular: Denies palpitation, chest discomfort or lower extremity swelling Gastrointestinal:  Denies nausea, heartburn or change in bowel habits Skin: Denies abnormal skin rashes Lymphatics: Denies new lymphadenopathy or easy bruising Neurological:Denies numbness, tingling or new weaknesses Behavioral/Psych: Mood is stable, no new changes  All other systems were reviewed with the patient and are negative.  I have reviewed the past medical history, past surgical history, social history and family history with the patient and they are unchanged from previous note.  ALLERGIES:  has No Known Allergies.  MEDICATIONS:  Current Outpatient Prescriptions  Medication Sig Dispense Refill  . LORazepam (ATIVAN) 0.5 MG tablet Take 1 tablet up to BID PRN nausea or anxiety.  Can be taken 30 min before radiotherapy for anxiety. 50 tablet 0  . sodium fluoride (FLUORISHIELD) 1.1 % GEL dental gel Instill one drop of gel per tooth space of fluoride tray. Place over teeth for 5 minutes. Remove. Spit out excess. Repeat nightly. 120 mL prn  . fentaNYL (DURAGESIC - DOSED MCG/HR) 25 MCG/HR patch Place 1 patch (25 mcg total) onto the skin  every 3 (three) days. 5 patch 0  . lidocaine-prilocaine (EMLA) cream Apply to affected area once 30 g 3  . Morphine Sulfate (MORPHINE CONCENTRATE) 10 mg / 0.5 ml concentrated solution Take 0.5 mLs (10 mg  total) by mouth every 2 (two) hours as needed for severe pain. 240 mL 0  . ondansetron (ZOFRAN) 8 MG tablet Take 1 tablet (8 mg total) by mouth every 8 (eight) hours as needed. Start on the third day after chemotherapy. 60 tablet 1  . prochlorperazine (COMPAZINE) 10 MG tablet Take 1 tablet (10 mg total) by mouth every 6 (six) hours as needed (Nausea or vomiting). 60 tablet 1   No current facility-administered medications for this visit.     PHYSICAL EXAMINATION: ECOG PERFORMANCE STATUS: 1 - Symptomatic but completely ambulatory  Vitals:   05/18/16 1157  BP: 111/84  Pulse: 75  Resp: 18  Temp: 98.5 F (36.9 C)   Filed Weights   05/18/16 1157  Weight: 156 lb 11.2 oz (71.1 kg)    GENERAL:alert, no distress and comfortable SKIN: skin color, texture, turgor are normal, no rashes or significant lesions EYES: normal, Conjunctiva are pink and non-injected, sclera clear OROPHARYNX:no exudate, no erythema and lips, buccal mucosa, and tongue normal  NECK: supple, thyroid normal size, non-tender, without nodularity LYMPH:  no palpable lymphadenopathy in the cervical, axillary or inguinal LUNGS: clear to auscultation and percussion with normal breathing effort HEART: regular rate & rhythm and no murmurs and no lower extremity edema ABDOMEN:abdomen soft, non-tender and normal bowel sounds. Feeding tube site looks okay Musculoskeletal:no cyanosis of digits and no clubbing  NEURO: alert & oriented x 3 with fluent speech, no focal motor/sensory deficits  LABORATORY DATA:  I have reviewed the data as listed    Component Value Date/Time   NA 138 05/17/2016 1250   K 4.3 05/17/2016 1250   CL 105 05/17/2016 1250   CO2 25 05/17/2016 1250   GLUCOSE 86 05/17/2016 1250   BUN 14 05/17/2016 1250   CREATININE 0.72 05/17/2016 1250   CALCIUM 9.7 05/17/2016 1250   PROT 8.3 (H) 04/27/2016 1155   ALBUMIN 4.9 04/27/2016 1155   AST 27 04/27/2016 1155   ALT 21 04/27/2016 1155   ALKPHOS 52 04/27/2016  1155   BILITOT 0.7 04/27/2016 1155   GFRNONAA >60 05/17/2016 1250   GFRAA >60 05/17/2016 1250    No results found for: SPEP, UPEP  Lab Results  Component Value Date   WBC 9.1 05/17/2016   NEUTROABS 5.0 05/17/2016   HGB 15.2 05/17/2016   HCT 43.3 05/17/2016   MCV 90.6 05/17/2016   PLT 273 05/17/2016      Chemistry      Component Value Date/Time   NA 138 05/17/2016 1250   K 4.3 05/17/2016 1250   CL 105 05/17/2016 1250   CO2 25 05/17/2016 1250   BUN 14 05/17/2016 1250   CREATININE 0.72 05/17/2016 1250      Component Value Date/Time   CALCIUM 9.7 05/17/2016 1250   ALKPHOS 52 04/27/2016 1155   AST 27 04/27/2016 1155   ALT 21 04/27/2016 1155   BILITOT 0.7 04/27/2016 1155       RADIOGRAPHIC STUDIES: I have personally reviewed the radiological images as listed and agreed with the findings in the report. Ir Gastrostomy Tube  Result Date: 05/17/2016 INDICATION: 60 year old male with a left tonsillar squamous cell carcinoma. He requires placement of a port catheter for durable central venous access as well as placement of a percutaneous gastrostomy  tube to prepare for possible enteral feeding during radiation therapy. EXAM: Fluoroscopically guided placement of percutaneous pull-through gastrostomy tube Interventional Radiologist:  Criselda Peaches, MD MEDICATIONS: 2 g Ancef; Antibiotics were administered within 1 hour of the procedure. ANESTHESIA/SEDATION: Versed 1 mg IV; Fentanyl 25 mcg IV Moderate Sedation Time:  10 minutes The patient was continuously monitored during the procedure by the interventional radiology nurse under my direct supervision. CONTRAST:  10 mL Isovue 370 FLUOROSCOPY TIME:  Fluoroscopy Time: 2 minutes 12 seconds (10 mGy). COMPLICATIONS: None immediate. PROCEDURE: Informed written consent was obtained from the patient after a thorough discussion of the procedural risks, benefits and alternatives. All questions were addressed. Maximal Sterile Barrier Technique  was utilized including caps, mask, sterile gowns, sterile gloves, sterile drape, hand hygiene and skin antiseptic. A timeout was performed prior to the initiation of the procedure. Maximal barrier sterile technique utilized including caps, mask, sterile gowns, sterile gloves, large sterile drape, hand hygiene, and chlorhexadine skin prep. An angled catheter was advanced over a wire under fluoroscopic guidance through the nose, down the esophagus and into the body of the stomach. The stomach was then insufflated with several 100 ml of air. Fluoroscopy confirmed location of the gastric bubble, as well as inferior displacement of the barium stained colon. Under direct fluoroscopic guidance, a single T-tack was placed, and the anterior gastric wall drawn up against the anterior abdominal wall. Percutaneous access was then obtained into the mid gastric body with an 18 gauge sheath needle. Aspiration of air, and injection of contrast material under fluoroscopy confirmed needle placement. An Amplatz wire was advanced in the gastric body and the access needle exchanged for a 9-French vascular sheath. A snare device was advanced through the vascular sheath and an Amplatz wire advanced through the angled catheter. The Amplatz wire was successfully snared and this was pulled up through the esophagus and out the mouth. A 20-French Alinda Dooms MIC-PEG tube was then connected to the snare and pulled through the mouth, down the esophagus, into the stomach and out to the anterior abdominal wall. Hand injection of contrast material confirmed intragastric location. The T-tack retention suture was then cut. The pull through peg tube was then secured with the external bumper and capped. The patient will be observed for several hours with the newly placed tube on low wall suction to evaluate for any post procedure complication. The patient tolerated the procedure well, there is no immediate complication. IMPRESSION: Successful  placement of a 20 French pull through gastrostomy tube. Electronically Signed   By: Jacqulynn Cadet M.D.   On: 05/17/2016 16:48   Ir US Guide Vasc Access Right  Result Date: 05/17/2016 INDICATION: 60 year old male with a left tonsillar squamous cell carcinoma. He requires placement of a port catheter for durable central venous access as well as placement of a percutaneous gastrostomy tube to prepare for possible enteral feeding during radiation therapy. EXAM: IMPLANTED PORT A CATH PLACEMENT WITH ULTRASOUND AND FLUOROSCOPIC GUIDANCE MEDICATIONS: 2 g Ancef; The antibiotic was administered within an appropriate time interval prior to skin puncture. ANESTHESIA/SEDATION: Versed 2 mg IV; Fentanyl 100 mcg IV; Moderate Sedation Time:  20 minutes The patient was continuously monitored during the procedure by the interventional radiology nurse under my direct supervision. FLUOROSCOPY TIME:  0 minutes, 18 seconds (2 mGy) COMPLICATIONS: None immediate. PROCEDURE: The right neck and chest was prepped with chlorhexidine, and draped in the usual sterile fashion using maximum barrier technique (cap and mask, sterile gown, sterile gloves, large sterile sheet,  hand hygiene and cutaneous antiseptic). Antibiotic prophylaxis was provided with 2g Ancef administered IV one hour prior to skin incision. Local anesthesia was attained by infiltration with 1% lidocaine with epinephrine. Ultrasound demonstrated patency of the right internal jugular vein, and this was documented with an image. Under real-time ultrasound guidance, this vein was accessed with a 21 gauge micropuncture needle and image documentation was performed. A small dermatotomy was made at the access site with an 11 scalpel. A 0.018" wire was advanced into the SVC and the access needle exchanged for a 43F micropuncture vascular sheath. The 0.018" wire was then removed and a 0.035" wire advanced into the IVC. An appropriate location for the subcutaneous reservoir was  selected below the clavicle and an incision was made through the skin and underlying soft tissues. The subcutaneous tissues were then dissected using a combination of blunt and sharp surgical technique and a pocket was formed. A single lumen power injectable portacatheter was then tunneled through the subcutaneous tissues from the pocket to the dermatotomy and the port reservoir placed within the subcutaneous pocket. The venous access site was then serially dilated and a peel away vascular sheath placed over the wire. The wire was removed and the port catheter advanced into position under fluoroscopic guidance. The catheter tip is positioned in the superior cavoatrial junction. This was documented with a spot image. The portacatheter was then tested and found to flush and aspirate well. The port was flushed with saline followed by 100 units/mL heparinized saline. The pocket was then closed in two layers using first subdermal inverted interrupted absorbable sutures followed by a running subcuticular suture. The epidermis was then sealed with Dermabond. The dermatotomy at the venous access site was also closed with a single inverted subdermal suture and the epidermis sealed with Dermabond. IMPRESSION: Successful placement of a right IJ approach Power Port with ultrasound and fluoroscopic guidance. The catheter is ready for use. Electronically Signed   By: Jacqulynn Cadet M.D.   On: 05/17/2016 16:47   Ir Fluoro Guide Port Insertion Right  Result Date: 05/17/2016 INDICATION: 60 year old male with a left tonsillar squamous cell carcinoma. He requires placement of a port catheter for durable central venous access as well as placement of a percutaneous gastrostomy tube to prepare for possible enteral feeding during radiation therapy. EXAM: IMPLANTED PORT A CATH PLACEMENT WITH ULTRASOUND AND FLUOROSCOPIC GUIDANCE MEDICATIONS: 2 g Ancef; The antibiotic was administered within an appropriate time interval prior to skin  puncture. ANESTHESIA/SEDATION: Versed 2 mg IV; Fentanyl 100 mcg IV; Moderate Sedation Time:  20 minutes The patient was continuously monitored during the procedure by the interventional radiology nurse under my direct supervision. FLUOROSCOPY TIME:  0 minutes, 18 seconds (2 mGy) COMPLICATIONS: None immediate. PROCEDURE: The right neck and chest was prepped with chlorhexidine, and draped in the usual sterile fashion using maximum barrier technique (cap and mask, sterile gown, sterile gloves, large sterile sheet, hand hygiene and cutaneous antiseptic). Antibiotic prophylaxis was provided with 2g Ancef administered IV one hour prior to skin incision. Local anesthesia was attained by infiltration with 1% lidocaine with epinephrine. Ultrasound demonstrated patency of the right internal jugular vein, and this was documented with an image. Under real-time ultrasound guidance, this vein was accessed with a 21 gauge micropuncture needle and image documentation was performed. A small dermatotomy was made at the access site with an 11 scalpel. A 0.018" wire was advanced into the SVC and the access needle exchanged for a 43F micropuncture vascular sheath. The 0.018" wire  was then removed and a 0.035" wire advanced into the IVC. An appropriate location for the subcutaneous reservoir was selected below the clavicle and an incision was made through the skin and underlying soft tissues. The subcutaneous tissues were then dissected using a combination of blunt and sharp surgical technique and a pocket was formed. A single lumen power injectable portacatheter was then tunneled through the subcutaneous tissues from the pocket to the dermatotomy and the port reservoir placed within the subcutaneous pocket. The venous access site was then serially dilated and a peel away vascular sheath placed over the wire. The wire was removed and the port catheter advanced into position under fluoroscopic guidance. The catheter tip is positioned in  the superior cavoatrial junction. This was documented with a spot image. The portacatheter was then tested and found to flush and aspirate well. The port was flushed with saline followed by 100 units/mL heparinized saline. The pocket was then closed in two layers using first subdermal inverted interrupted absorbable sutures followed by a running subcuticular suture. The epidermis was then sealed with Dermabond. The dermatotomy at the venous access site was also closed with a single inverted subdermal suture and the epidermis sealed with Dermabond. IMPRESSION: Successful placement of a right IJ approach Power Port with ultrasound and fluoroscopic guidance. The catheter is ready for use. Electronically Signed   By: Jacqulynn Cadet M.D.   On: 05/17/2016 16:47     ASSESSMENT & PLAN:  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[facility]) We discussed the role of chemotherapy. The intent is for cure.  We discussed some of the risks, benefits, side-effects of cisplatin. Some of the short term side-effects included, though not limited to, including weight loss, life threatening infections, risk of allergic reactions, need for transfusions of blood products, nausea, vomiting, change in bowel habits, loss of hair, admission to hospital for various reasons, and risks of death.   Long term side-effects are also discussed including risks of infertility, permanent damage to nerve function, hearing loss, chronic fatigue, kidney damage with possibility needing hemodialysis, and rare secondary malignancy including bone marrow disorders.  The patient is aware that the response rates discussed earlier is not guaranteed.  After a long discussion, patient made an informed decision to proceed with the prescribed plan of care and went ahead to sign the consent form today.   Patient education material was dispensed.  Treatment decision is based on NCCN guidelines, 100 mg/m2 every 3 weeks for total of 3 cycles with concurrent  radiation treatment. 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 Burt Knack, M.D., Lucille Passy. Georjean Mode, Ph.D., Arlene A. Jasper Loser, M.D., Blase Mess, M.D., Darrick Penna. Megan Salon, M.D., [place] Jonna Munro, M.D., Domingo Cocking. Lyndon Code, M.D., Sallye Lat, M.D., Luciano Cutter, M.D., Polly Cobia, M.D., Minerva Ends, M.D., Sandre Kitty, M.D., [place].S. Mariane Duval, M.D., Trinidad Curet, M.D., Meryl Dare, M.D., and Danae Orleans. Fu, M.D. for the [facility] [address] [employer] [facility] 2004; [phone], 2004DOI: [id number]  The patient will get 2 additional days after each cisplatin dose along with IV dexamethasone as a preemptive treatment against risk of excessive dehydration and renal failure He will be seeing you on a weekly basis of supportive care I provided him prescription for anti-emetics, prescription for pain medicine as well as prescription EMLA cream   S/P gastrostomy ([place]) The patient has mild pain and nausea after procedure. Unfortunately, he was not able  to fill his prescription yesterday. We discussed liquid diet, pain management and medication for nausea  Cancer associated pain The patient has mild pain after recent procedure and I anticipate he would need pain medicine in the future. We discussed narcotic refill policy. I want him potential side effects of pain medication. He will start liquid pain medicine this weekend for local pain control  We discussed the importance of preventive care and reviewed the vaccination programs. He does not have any prior allergic reactions to influenza vaccination. He agrees to proceed with influenza vaccination today and we will administer it today at the clinic.  All questions were answered. The patient knows to call the clinic with any problems, questions or concerns. No barriers to learning was detected. I spent  40 minutes counseling the patient face to face. The total time spent in the appointment was 55 minutes and more than 50% was on counseling and review of test results     [facility], [place], MD 05/18/2016 1:58 PM

## 2016-05-18 NOTE — Telephone Encounter (Signed)
Gave patient avs report and appointments for September thru November. Scheduled as per 9/14 los.

## 2016-05-19 ENCOUNTER — Encounter: Payer: Self-pay | Admitting: *Deleted

## 2016-05-19 ENCOUNTER — Telehealth: Payer: Self-pay | Admitting: *Deleted

## 2016-05-19 NOTE — Telephone Encounter (Signed)
Pt in our lobby and asked to see nurse.  I brought pt back to exam room to discuss his concerns.  He states he felt a little dizzy and light headed earlier today along w/ feeling like his heart was racing.  He asks if it is a reaction to the flu shot he got yesterday? He had no rash/ redness or swelling at injection site.   Asked pt if he took any pain medication today.  He showed me his roxanol bottle and the syringe. He showed that he took 0.4 ml which is a little less than 10 mg of morphine.   Informed pt that morphine can cause some dizziness and light headedness,  Although less likely to cause heart palpitations.  Instructed pt to take only 0.25 ml (5 mg) of morphine next time.  He can repeat dose in one hour if it does not help his pain. This may avoid side effect of dizziness.  He verbalized understanding and will use lower dose next time. He says his pain is better now. He says Gayleen Orem helped him with how to flush his PEG tube and he has no questions about this.  His PAC site looked good w/o any signs of infection.    Pt said he used BP cuff at home and the "heart light" was beeping but he could not tell nurse what his heart rate or his BP was.  He was concerned because of the beeping alarm noise, worried it meant his heart rate was too fast or his BP was too high.  Instructed pt to write down his BP and HR next time, so nurse can let him know if it is abnormal.  He can bring the monitor in with him if he wants nurse to review how to read/ use it with him.  VS in office;  BP 119/84, HR 80, O2 sat 99% and temp 98.7 F.   He brought his digital thermometer from home and I observed him using it correctly.   Pt says he feels ok right now and only has mild pain at PEG tube site.  PAC site is just a little sore.  He says he is a little anxious and just wanted to make sure he is ok.   Assured pt that his VS are stable.  Encouraged him to take less morphine next time.  Encouraged him to drink a lot of  fluids this weekend.  He could be a little dehydrated.  Also informed pt he can take ativan as needed for anxiety although it was prescribed to be taken before Radiation treatment.  He can also take for anxiety.    Pt said he felt better after talking to nurse.  He plans to "relax" this weekend before starting his treatment on Monday.  He says he knows to call us if any further concerns.

## 2016-05-19 NOTE — Progress Notes (Signed)
Head & Neck Multidisciplinary Clinic Clinical Social Work  Clinical Social Work met with patient at head & neck multidisciplinary clinic to offer support and assess for psychosocial needs.  [name] [name] shared he is coping adequately with diagnosis and is appreciative of all the information he's received from the healthcare team to understand "what to expect" with treatment.  Mr. Ardito lives with his mother and has a limited support system. The patient shared he had anxiety when wearing the radiation mask- his PCP prescribed him anxiety medication however, patient, hesitant to take medications. CSW encouraged patient to utilize anxiety medication prior to treatments to address anxiety and normalized anxiety as a part of the treatment process.  CSW provided inforamtion on breathing and meditation practices.  Patient was interested in meditation CD as another tool to cope with anxiety.  The patient is signed up for [employer] transportation services.  He plans to use the bus for transportation while he's "feeling good" and start [employer] services once he experiences more fatigue.  CSW provided patient with 10 ride [employer] pass.  Clinical Social Work briefly discussed Clinical Social Work role and [employer] support programs/services.  Clinical Social Work encouraged patient to call with any additional questions or concerns.  ONCBCN DISTRESS SCREENING 04/26/2016  Screening Type Initial Screening  Distress experienced in past week (1-10) 8  Practical problem type Housing  Emotional problem type Depression;Nervousness/Anxiety;Adjusting to illness  Information Concerns Type Lack of info about diagnosis  Physical Problem type Pain  Physician notified of physical symptoms Yes    Polo Riley, MSW, LCSW, OSW-C Clinical Social Worker [place] [phone]

## 2016-05-19 NOTE — Progress Notes (Signed)
Met with [name] [name] during [facility] [place].  He was unaccompanied.  Provided verbal and written overview of [place], the clinicians who will be seeing him, encouraged him to ask questions during his time with them.  He was seen by Nutrition, SLP, PT and SW. Using  PEG teaching device   and Teach Back, provided education for bolus administration of daily water flushes, nutritional supplement, fluids and medications.  He   provided correct return demonstration.  He understands I will provide additional education Wednesday when he arrives for PEG/PAC placement.  Spoke with him at end of [facility], addressed questions. He understands I can be contacted with needs/concerns.  [name] Orem, RN, BSN, [place] at [place] [phone]

## 2016-05-19 NOTE — Progress Notes (Signed)
Oncology Nurse Navigator Documentation  Received call from [name] [name] who was at [facility].  He stated "my tube isn't working, can you take a look at it?".  I asked him to come to Radiation Waiting to see me. I arrived him to exam room where he demonstrated correct gravity flush technique, PEG flushed without difficulty.  After discussion, it was apparent he had not removed the cap on the tip of the syringe before inserting into PEG. He stated he has changed dressing a couple off times without difficulty.  Stated he has seen a minimal amount of blood on dsg. We discussed his start of RT and chemotherapy on Monday.  He correctly explained procedure/time for applying Emla cream to PAC prior to chemo treatment. I removed occlusive dressing still on PAC, he voiced understanding to let liquid bandage come off on its own.   Gayleen Orem, RN, BSN, [place] at [place] [phone]

## 2016-05-19 NOTE — Progress Notes (Signed)
Oncology Nurse Navigator Documentation  Using  PEG teaching device   and Teach Back, provided education for PEG care, including: hand hygiene, care of tube insertion site including daily dressing change and cleaning; S&S of infection.  Mr. Saetern correctly verbalized dressing change and cleaning procedures.  I reviewed gravity administration of water/nutrition supplement discussed earlier this week at [facility] [place].  I discussed content of PEG 'starter kit' delivered by [place]. He voiced understanding of education provided.  He understands I will be available for ongoing PEG support.  Gayleen Orem, RN, BSN, [place] at [place] [phone]

## 2016-05-22 ENCOUNTER — Encounter: Payer: Self-pay | Admitting: *Deleted

## 2016-05-22 ENCOUNTER — Ambulatory Visit: Payer: No Typology Code available for payment source | Admitting: Nutrition

## 2016-05-22 ENCOUNTER — Ambulatory Visit (HOSPITAL_BASED_OUTPATIENT_CLINIC_OR_DEPARTMENT_OTHER): Payer: No Typology Code available for payment source | Admitting: Nurse Practitioner

## 2016-05-22 ENCOUNTER — Ambulatory Visit (HOSPITAL_BASED_OUTPATIENT_CLINIC_OR_DEPARTMENT_OTHER): Payer: No Typology Code available for payment source

## 2016-05-22 ENCOUNTER — Ambulatory Visit: Payer: Medicaid Other | Admitting: Radiation Oncology

## 2016-05-22 ENCOUNTER — Ambulatory Visit
Admission: RE | Admit: 2016-05-22 | Payer: No Typology Code available for payment source | Source: Ambulatory Visit | Admitting: Radiation Oncology

## 2016-05-22 VITALS — BP 125/79 | HR 65 | Temp 98.2°F | Resp 16

## 2016-05-22 DIAGNOSIS — Z5111 Encounter for antineoplastic chemotherapy: Secondary | ICD-10-CM

## 2016-05-22 DIAGNOSIS — R0602 Shortness of breath: Secondary | ICD-10-CM

## 2016-05-22 DIAGNOSIS — T7840XA Allergy, unspecified, initial encounter: Secondary | ICD-10-CM

## 2016-05-22 DIAGNOSIS — C090 Malignant neoplasm of tonsillar fossa: Secondary | ICD-10-CM

## 2016-05-22 MED ORDER — POTASSIUM CHLORIDE 2 MEQ/ML IV SOLN
Freq: Once | INTRAVENOUS | Status: AC
  Administered 2016-05-22: 09:00:00 via INTRAVENOUS
  Filled 2016-05-22: qty 10

## 2016-05-22 MED ORDER — PALONOSETRON HCL INJECTION 0.25 MG/5ML
INTRAVENOUS | Status: AC
  Filled 2016-05-22: qty 5

## 2016-05-22 MED ORDER — HEPARIN SOD (PORK) LOCK FLUSH 100 UNIT/ML IV SOLN
500.0000 [IU] | Freq: Once | INTRAVENOUS | Status: AC | PRN
  Administered 2016-05-22: 500 [IU]
  Filled 2016-05-22: qty 5

## 2016-05-22 MED ORDER — FAMOTIDINE IN NACL 20-0.9 MG/50ML-% IV SOLN
20.0000 mg | Freq: Two times a day (BID) | INTRAVENOUS | Status: DC
  Administered 2016-05-22: 20 mg via INTRAVENOUS

## 2016-05-22 MED ORDER — SODIUM CHLORIDE 0.9 % IV SOLN
100.0000 mg/m2 | Freq: Once | INTRAVENOUS | Status: AC
  Administered 2016-05-22: 192 mg via INTRAVENOUS
  Filled 2016-05-22: qty 192

## 2016-05-22 MED ORDER — SODIUM CHLORIDE 0.9% FLUSH
10.0000 mL | INTRAVENOUS | Status: DC | PRN
  Administered 2016-05-22: 10 mL
  Filled 2016-05-22: qty 10

## 2016-05-22 MED ORDER — PALONOSETRON HCL INJECTION 0.25 MG/5ML
0.2500 mg | Freq: Once | INTRAVENOUS | Status: AC
  Administered 2016-05-22: 0.25 mg via INTRAVENOUS

## 2016-05-22 MED ORDER — SODIUM CHLORIDE 0.9 % IV SOLN
Freq: Once | INTRAVENOUS | Status: AC
  Administered 2016-05-22: 13:00:00 via INTRAVENOUS
  Filled 2016-05-22: qty 5

## 2016-05-22 MED ORDER — DIPHENHYDRAMINE HCL 50 MG/ML IJ SOLN
25.0000 mg | Freq: Once | INTRAMUSCULAR | Status: AC
  Administered 2016-05-22: 25 mg via INTRAVENOUS

## 2016-05-22 NOTE — Progress Notes (Signed)
Pt was finishing hydration fluids after Cisplatin with no complaints when he mentioned his throat "feeling funny", "like I have something stuck in there making me short of breath". Patient denies pain, VSS (see flowsheets). Selena Lesser NP at bedside. Benadryl and Pepcid given (see MAR). Patient stable. Patient has no further complaints at this time.

## 2016-05-22 NOTE — Patient Instructions (Addendum)
[facility] Discharge Instructions for Patients Receiving Chemotherapy  Today you received the following chemotherapy agents Cisplatin  To help prevent nausea and vomiting after your treatment, we encourage you to take your nausea medication as directed. No Zofran for 3 days, take Compazine instead.   If you develop nausea and vomiting that is not controlled by your nausea medication, call the clinic.   BELOW ARE SYMPTOMS THAT SHOULD BE REPORTED IMMEDIATELY:  *FEVER GREATER THAN 100.5 F  *CHILLS WITH OR WITHOUT FEVER  NAUSEA AND VOMITING THAT IS NOT CONTROLLED WITH YOUR NAUSEA MEDICATION  *UNUSUAL SHORTNESS OF BREATH  *UNUSUAL BRUISING OR BLEEDING  TENDERNESS IN MOUTH AND THROAT WITH OR WITHOUT PRESENCE OF ULCERS  *URINARY PROBLEMS  *BOWEL PROBLEMS  UNUSUAL RASH Items with * indicate a potential emergency and should be followed up as soon as possible.  Feel free to call the clinic you have any questions or concerns. The clinic phone number is [phone].  Please show the [place] at check-in to the Emergency Department and triage nurse.   Cisplatin injection What is this medicine? CISPLATIN (SIS pla tin) is a chemotherapy drug. It targets fast dividing cells, like cancer cells, and causes these cells to die. This medicine is used to treat many types of cancer like bladder, ovarian, and testicular cancers. This medicine may be used for other purposes; ask your health care provider or pharmacist if you have questions. What should I tell my health care provider before I take this medicine? They need to know if you have any of these conditions: -blood disorders -hearing problems -kidney disease -recent or ongoing radiation therapy -an unusual or allergic reaction to cisplatin, carboplatin, other chemotherapy, other medicines, foods, dyes, or preservatives -pregnant or trying to get pregnant -breast-feeding How should I use this medicine? This  drug is given as an infusion into a vein. It is administered in a hospital or clinic by a specially trained health care professional. Talk to your pediatrician regarding the use of this medicine in children. Special care may be needed. Overdosage: If you think you have taken too much of this medicine contact a poison control center or emergency room at once. NOTE: This medicine is only for you. Do not share this medicine with others. What if I miss a dose? It is important not to miss a dose. Call your doctor or health care professional if you are unable to keep an appointment. What may interact with this medicine? -dofetilide -foscarnet -medicines for seizures -medicines to increase blood counts like filgrastim, pegfilgrastim, sargramostim -probenecid -pyridoxine used with altretamine -rituximab -some antibiotics like amikacin, gentamicin, neomycin, polymyxin B, streptomycin, tobramycin -sulfinpyrazone -vaccines -zalcitabine Talk to your doctor or health care professional before taking any of these medicines: -acetaminophen -aspirin -ibuprofen -ketoprofen -naproxen This list may not describe all possible interactions. Give your health care provider a list of all the medicines, herbs, non-prescription drugs, or dietary supplements you use. Also tell them if you smoke, drink alcohol, or use illegal drugs. Some items may interact with your medicine. What should I watch for while using this medicine? Your condition will be monitored carefully while you are receiving this medicine. You will need important blood work done while you are taking this medicine. This drug may make you feel generally unwell. This is not uncommon, as chemotherapy can affect healthy cells as well as cancer cells. Report any side effects. Continue your course of treatment even though you feel ill unless your doctor tells you  to stop. In some cases, you may be given additional medicines to help with side effects. Follow  all directions for their use. Call your doctor or health care professional for advice if you get a fever, chills or sore throat, or other symptoms of a cold or flu. Do not treat yourself. This drug decreases your body's ability to fight infections. Try to avoid being around people who are sick. This medicine may increase your risk to bruise or bleed. Call your doctor or health care professional if you notice any unusual bleeding. Be careful brushing and flossing your teeth or using a toothpick because you may get an infection or bleed more easily. If you have any dental work done, tell your dentist you are receiving this medicine. Avoid taking products that contain aspirin, acetaminophen, ibuprofen, naproxen, or ketoprofen unless instructed by your doctor. These medicines may hide a fever. Do not become pregnant while taking this medicine. Women should inform their doctor if they wish to become pregnant or think they might be pregnant. There is a potential for serious side effects to an unborn child. Talk to your health care professional or pharmacist for more information. Do not breast-feed an infant while taking this medicine. Drink fluids as directed while you are taking this medicine. This will help protect your kidneys. Call your doctor or health care professional if you get diarrhea. Do not treat yourself. What side effects may I notice from receiving this medicine? Side effects that you should report to your doctor or health care professional as soon as possible: -allergic reactions like skin rash, itching or hives, swelling of the face, lips, or tongue -signs of infection - fever or chills, cough, sore throat, pain or difficulty passing urine -signs of decreased platelets or bleeding - bruising, pinpoint red spots on the skin, black, tarry stools, nosebleeds -signs of decreased red blood cells - unusually weak or tired, fainting spells, lightheadedness -breathing problems -changes in  hearing -gout pain -low blood counts - This drug may decrease the number of white blood cells, red blood cells and platelets. You may be at increased risk for infections and bleeding. -nausea and vomiting -pain, swelling, redness or irritation at the injection site -pain, tingling, numbness in the hands or feet -problems with balance, movement -trouble passing urine or change in the amount of urine Side effects that usually do not require medical attention (report to your doctor or health care professional if they continue or are bothersome): -changes in vision -loss of appetite -metallic taste in the mouth or changes in taste This list may not describe all possible side effects. Call your doctor for medical advice about side effects. You may report side effects to FDA at 1-800-FDA-1088. Where should I keep my medicine? This drug is given in a hospital or clinic and will not be stored at home. NOTE: This sheet is a summary. It may not cover all possible information. If you have questions about this medicine, talk to your doctor, pharmacist, or health care provider.    2016, Elsevier/Gold Standard. (2007-11-26 14:40:54)

## 2016-05-22 NOTE — Progress Notes (Signed)
Nutrition follow-up completed with patient during infusion for tonsil cancer. Patient is status post feeding tube placement on September 13. Weight has decreased and was documented as 156.7 pounds September 14, down from 161.5 pounds. Patient reports weight loss reflects hospitalization and having to remain nothing by mouth for procedures. He states he has a good appetite and enjoys Ensure Plus. Reports he is flushing his feeding tube daily without difficulty.  Nutrition diagnosis: Predicted suboptimal energy intake continues.  Intervention: I enforced importance of increased calories and protein. Recommended Ensure Plus 2-3 times daily. Provided one complementary case of Ensure Plus. Questions were answered.  Teach back method used.  Monitoring, evaluation, goals: Patient will work to increase calories and protein to minimize further weight loss.  Next visit: Monday, October 9, during infusion or sooner if needed.  **Disclaimer: This note was dictated with voice recognition software. Similar sounding words can inadvertently be transcribed and this note may contain transcription errors which may not have been corrected upon publication of note.**

## 2016-05-23 ENCOUNTER — Encounter: Payer: Self-pay | Admitting: Nurse Practitioner

## 2016-05-23 ENCOUNTER — Ambulatory Visit: Payer: Medicaid Other

## 2016-05-23 ENCOUNTER — Ambulatory Visit
Admission: RE | Admit: 2016-05-23 | Discharge: 2016-05-23 | Disposition: A | Discharge status: Home / Self Care | Payer: Medicaid Other | Source: Ambulatory Visit | Attending: Radiation Oncology | Admitting: Radiation Oncology

## 2016-05-23 ENCOUNTER — Ambulatory Visit (HOSPITAL_BASED_OUTPATIENT_CLINIC_OR_DEPARTMENT_OTHER): Payer: No Typology Code available for payment source

## 2016-05-23 VITALS — BP 118/80 | HR 60 | Temp 97.9°F | Resp 16

## 2016-05-23 DIAGNOSIS — Z5189 Encounter for other specified aftercare: Secondary | ICD-10-CM

## 2016-05-23 DIAGNOSIS — Z510 Encounter for antineoplastic radiation therapy: Secondary | ICD-10-CM | POA: Diagnosis not present

## 2016-05-23 DIAGNOSIS — T7840XA Allergy, unspecified, initial encounter: Secondary | ICD-10-CM | POA: Insufficient documentation

## 2016-05-23 DIAGNOSIS — C090 Malignant neoplasm of tonsillar fossa: Secondary | ICD-10-CM

## 2016-05-23 MED ORDER — HEPARIN SOD (PORK) LOCK FLUSH 100 UNIT/ML IV SOLN
500.0000 [IU] | Freq: Once | INTRAVENOUS | Status: AC | PRN
  Administered 2016-05-23: 500 [IU]
  Filled 2016-05-23: qty 5

## 2016-05-23 MED ORDER — SODIUM CHLORIDE 0.9 % IV SOLN
10.0000 mg | Freq: Once | INTRAVENOUS | Status: AC
  Administered 2016-05-23: 10 mg via INTRAVENOUS
  Filled 2016-05-23: qty 1

## 2016-05-23 MED ORDER — SODIUM CHLORIDE 0.9 % IV SOLN
INTRAVENOUS | Status: AC
  Administered 2016-05-23: 13:00:00 via INTRAVENOUS

## 2016-05-23 MED ORDER — SODIUM CHLORIDE 0.9% FLUSH
10.0000 mL | INTRAVENOUS | Status: DC | PRN
  Administered 2016-05-23: 10 mL
  Filled 2016-05-23: qty 10

## 2016-05-23 MED FILL — BANOPHEN 25 MG CAPSULE: 25 | 25 days supply | Qty: 100 | Fill #0

## 2016-05-23 MED FILL — HEARTBURN RELIEF TABLET: 10 | 7 days supply | Qty: 30 | Fill #0

## 2016-05-23 NOTE — Assessment & Plan Note (Signed)
Patient presented to the [facility] today to receive cycle one of his cisplatin chemotherapy regimen.  He is scheduled to start his radiation treatments tomorrow 05/23/2016.  He will also return tomorrow 05/23/2016 and Wednesday, 05/24/2016 for additional IV fluid rehydration.

## 2016-05-23 NOTE — Progress Notes (Signed)
SYMPTOM MANAGEMENT CLINIC    Chief Complaint: Hypersensitivity reaction  HPI:  [name] [name] 60 y.o. 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  Currently, and when cisplatin chemotherapy and plans to initiate radiation treatments tomorrow.     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 Chronic small-vessel ischemic changes of the cerebral hemispheric white matter.       04/04/2016 Imaging    CT neck showed left tonsillar mass with diameter of 19 x 24 mm consistent with 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 necrosis. Suspicious level 5/supraclavicular nodes on the left. Scar type density in the right upper lobe is more prominent than was seen on a CT scan of the chest 10/06/2014      04/21/2016 PET scan    Asymmetric hypermetabolic soft tissue prominence in left palatine tonsil, consistent with known primary left tonsillar squamous cell Carcinoma. Left level 2 cervical lymphadenopathy, consistent with metastatic disease. No evidence of metastatic disease within the chest, abdomen, or pelvis. Stable 14 mm ground-glass and part solid nodular opacity in right lung apex compared to previous CT in 2016. This shows low-grade metabolic activity. Low-grade bronchogenic adenocarcinoma cannot be excluded. Consider surgical resection versus continued followup by CT in 12 months      05/17/2016 Procedure    Successful placement of a 20 French pull through gastrostomy tube & right IJ approach [facility] [place].       Review of Systems  Respiratory: Positive for shortness of breath.   All other systems reviewed and are negative.   Past Medical History:  Diagnosis Date  . Anxiety   . Cancer  ([place]) 04/04/2016   cancer of left tonsil  . COPD (chronic obstructive pulmonary disease) ([place])   . Hepatitis   . Hepatitis C    Treatment x 8 weeks 6-7 months ago [facility]  . Sinus congestion   . Stomach ulcer     Past Surgical History:  Procedure Laterality Date  . COLONOSCOPY WITH PROPOFOL N/A 04/14/2015   Procedure: COLONOSCOPY WITH PROPOFOL;  Surgeon: Arta Silence, MD;  Location: [facility];  Service: Endoscopy;  Laterality: N/A;  . HERNIA REPAIR    . IR GENERIC HISTORICAL  05/17/2016   IR FLUORO GUIDE PORT INSERTION RIGHT 05/17/2016 Jacqulynn Cadet, MD [facility]  . IR GENERIC HISTORICAL  05/17/2016   IR US GUIDE VASC ACCESS RIGHT 05/17/2016 Jacqulynn Cadet, MD [facility]  . IR GENERIC HISTORICAL  05/17/2016   IR GASTROSTOMY TUBE MOD SED 05/17/2016 Jacqulynn Cadet, MD [facility]  . MULTIPLE EXTRACTIONS WITH ALVEOLOPLASTY N/A 05/03/2016   Procedure: Multiple extractions with alveoloplasty and gross debridement of remaining teeth.;  Surgeon: Lenn Cal, DDS;  Location: [facility];  Service: Oral Surgery;  Laterality: N/A;  . 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    has No Known Allergies.    Medication List       Accurate as of 05/22/16 11:59 PM.  Always use your most recent med list.          fentaNYL 25 MCG/HR patch Commonly known as:  DURAGESIC - dosed mcg/hr Place 1 patch (25 mcg total) onto the skin every 3 (three) days.   lidocaine-prilocaine cream Commonly known as:  EMLA Apply to affected area once   LORazepam 0.5 MG tablet Commonly known as:  ATIVAN Take 1 tablet up to BID PRN nausea or anxiety.  Can be taken 30 min before radiotherapy for anxiety.   morphine CONCENTRATE 10 mg / 0.5 ml concentrated  solution Take 0.5 mLs (10 mg total) by mouth every 2 (two) hours as needed for severe pain.   ondansetron 8 MG tablet Commonly known as:  ZOFRAN Take 1 tablet (8 mg total) by mouth every 8 (eight) hours as needed. Start on the third day after chemotherapy.   prochlorperazine 10 MG tablet Commonly known as:  COMPAZINE Take 1 tablet (10 mg total) by mouth every 6 (six) hours as needed (Nausea or vomiting).   sodium fluoride 1.1 % Gel dental gel Commonly known as:  FLUORISHIELD Instill one drop of gel per tooth space of fluoride tray. Place over teeth for 5 minutes. Remove. Spit out excess. Repeat nightly.        PHYSICAL EXAMINATION  Oncology Vitals 05/22/2016 05/22/2016  Height - -  Weight - -  Weight (lbs) - -  BMI (kg/m2) - -  Temp 98.2 99.1  Pulse 65 56  Resp 16 16  SpO2 100 100  BSA (m2) - -   BP Readings from Last 2 Encounters:  05/22/16 125/79  05/18/16 111/84    Physical Exam  Constitutional: He is oriented to person, place, and time and well-developed, well-nourished, and in no distress.  HENT:  Head: Normocephalic and atraumatic.  Mouth/Throat: Oropharynx is clear and moist.  Eyes: Conjunctivae and EOM are normal. Pupils are equal, round, and reactive to light. Right eye exhibits no discharge. Left eye exhibits no discharge. No scleral icterus.  Neck: Normal range of motion. Neck supple. No JVD present. No tracheal deviation present. No thyromegaly present.  Cardiovascular: Normal rate, regular rhythm, normal heart sounds and intact distal pulses.   Pulmonary/Chest: Effort normal and breath sounds normal. No stridor. No respiratory distress. He has no wheezes. He has no rales. He exhibits no tenderness.  Abdominal: Soft. Bowel sounds are normal. He exhibits no distension and no mass. There is no tenderness. There is no rebound and no guarding.  Musculoskeletal: Normal range of motion.  Lymphadenopathy:    He has no cervical adenopathy.  Neurological: He is alert  and oriented to person, place, and time.  Skin: Skin is warm and dry. No rash noted. No erythema. No pallor.  Psychiatric: Affect normal.  Nursing note and vitals reviewed.   LABORATORY DATA:. No visits with results within 3 Day(s) from this visit.  Latest known visit with results is:  Hospital Outpatient Visit on 05/17/2016  Component Date Value Ref Range Status  . aPTT 05/17/2016 33  24 - 36 seconds Final  . Sodium 05/17/2016 138  135 - 145 mmol/L Final  . Potassium 05/17/2016 4.3  3.5 - 5.1 mmol/L Final  . Chloride 05/17/2016 105  101 - 111 mmol/L Final  . CO2 05/17/2016 25  22 - 32 mmol/L Final  . Glucose, Bld 05/17/2016 86  65 - 99 mg/dL Final  . BUN 89/91/0685 14  6 - 20 mg/dL Final  . Creatinine, Ser 05/17/2016 0.72  0.61 - 1.24  mg/dL Final  . Calcium 43/54/3561 9.7  8.9 - 10.3 mg/dL Final  . GFR calc non Af Amer 05/17/2016 >60  >60 mL/min Final  . GFR calc Af Amer 05/17/2016 >60  >60 mL/min Final   Comment: (NOTE) The eGFR has been calculated using the CKD EPI equation. This calculation has not been validated in all clinical situations. eGFR's persistently <60 mL/min signify possible Chronic Kidney Disease.   . Anion gap 05/17/2016 8  5 - 15 Final  . WBC 05/17/2016 9.1  4.0 - 10.5 K/uL Final  . RBC 05/17/2016 4.78  4.22 - 5.81 MIL/uL Final  . Hemoglobin 05/17/2016 15.2  13.0 - 17.0 g/dL Final  . HCT 43/37/0899 43.3  39.0 - 52.0 % Final  . MCV 05/17/2016 90.6  78.0 - 100.0 fL Final  . MCH 05/17/2016 31.8  26.0 - 34.0 pg Final  . MCHC 05/17/2016 35.1  30.0 - 36.0 g/dL Final  . RDW 99/35/5957 13.0  11.5 - 15.5 % Final  . Platelets 05/17/2016 273  150 - 400 K/uL Final  . Neutrophils Relative % 05/17/2016 55  % Final  . Neutro Abs 05/17/2016 5.0  1.7 - 7.7 K/uL Final  . Lymphocytes Relative 05/17/2016 36  % Final  . Lymphs Abs 05/17/2016 3.3  0.7 - 4.0 K/uL Final  . Monocytes Relative 05/17/2016 7  % Final  . Monocytes Absolute 05/17/2016 0.6  0.1 - 1.0 K/uL Final  .  Eosinophils Relative 05/17/2016 2  % Final  . Eosinophils Absolute 05/17/2016 0.2  0.0 - 0.7 K/uL Final  . Basophils Relative 05/17/2016 0  % Final  . Basophils Absolute 05/17/2016 0.0  0.0 - 0.1 K/uL Final  . Prothrombin Time 05/17/2016 12.6  11.4 - 15.2 seconds Final  . INR 05/17/2016 0.95   Final    RADIOGRAPHIC STUDIES: No results found.  ASSESSMENT/PLAN:    Malignant neoplasm of tonsillar fossa [facility]) Patient presented to the cancer Center today to receive cycle one of his cisplatin chemotherapy regimen.  He is scheduled to start his radiation treatments tomorrow 05/23/2016.  He will also return tomorrow 05/23/2016 and Wednesday, 05/24/2016 for additional IV fluid rehydration.  Hypersensitivity reaction Patient presented to the cancer Center today to receive cycle one of his cisplatin chemotherapy regimen.  He is scheduled to start his radiation treatments tomorrow 05/23/2016.  Patient had just completed all of the cisplatin infusion; when he complained of some mild shortness of breath and discomfort to the right upper chest region.  He denies any actual chest pain, chest pressure, or pain with inspiration.  Exam today revealed breath sounds clear bilaterally.  In no acute distress.  Vital signs were stable and O2 sat was within normal limits.  Patient was given Benadryl and Pepcid per protocol.  All symptoms essentially resolved; and patient was able to be discharged home with family.  He was encouraged to call/return or go directly to the emergency department for any worsening symptoms whatsoever.  Mild shortness of breath and discomfort could very well be related to patient's disease; instead of an actual hypersensitivity reaction.  He will also return tomorrow 05/23/2016 and Wednesday, 05/24/2016 for additional IV fluid rehydration.   Patient stated understanding of all instructions; and was in agreement with this plan of care. The patient knows to call the clinic with any  problems, questions or concerns.   Total time spent with patient was 25 minutes;  with greater than 75 percent of that time spent in face to face counseling regarding  patient's symptoms,  and coordination of care and follow up.  Disclaimer:This dictation was prepared with Dragon/digital dictation along with [employer]. Any transcriptional errors that result from this process are unintentional.  Drue Second, NP 05/23/2016

## 2016-05-23 NOTE — Assessment & Plan Note (Signed)
Patient presented to the [facility] today to receive cycle one of his cisplatin chemotherapy regimen.  He is scheduled to start his radiation treatments tomorrow 05/23/2016.  Patient had just completed all of the cisplatin infusion; when he complained of some mild shortness of breath and discomfort to the right upper chest region.  He denies any actual chest pain, chest pressure, or pain with inspiration.  Exam today revealed breath sounds clear bilaterally.  In no acute distress.  Vital signs were stable and O2 sat was within normal limits.  Patient was given Benadryl and Pepcid per protocol.  All symptoms essentially resolved; and patient was able to be discharged home with family.  He was encouraged to call/return or go directly to the emergency department for any worsening symptoms whatsoever.  Mild shortness of breath and discomfort could very well be related to patient's disease; instead of an actual hypersensitivity reaction.  He will also return tomorrow 05/23/2016 and Wednesday, 05/24/2016 for additional IV fluid rehydration.

## 2016-05-23 NOTE — Patient Instructions (Signed)

## 2016-05-24 ENCOUNTER — Telehealth: Payer: Self-pay | Admitting: Nurse Practitioner

## 2016-05-24 ENCOUNTER — Ambulatory Visit (HOSPITAL_BASED_OUTPATIENT_CLINIC_OR_DEPARTMENT_OTHER): Payer: No Typology Code available for payment source

## 2016-05-24 ENCOUNTER — Ambulatory Visit
Admission: RE | Admit: 2016-05-24 | Discharge: 2016-05-24 | Disposition: A | Discharge status: Home / Self Care | Payer: Medicaid Other | Source: Ambulatory Visit | Attending: Radiation Oncology | Admitting: Radiation Oncology

## 2016-05-24 VITALS — BP 107/74 | HR 48 | Temp 98.7°F | Resp 17

## 2016-05-24 DIAGNOSIS — Z5189 Encounter for other specified aftercare: Secondary | ICD-10-CM

## 2016-05-24 DIAGNOSIS — C090 Malignant neoplasm of tonsillar fossa: Secondary | ICD-10-CM

## 2016-05-24 DIAGNOSIS — Z510 Encounter for antineoplastic radiation therapy: Secondary | ICD-10-CM | POA: Diagnosis not present

## 2016-05-24 MED ORDER — SODIUM CHLORIDE 0.9 % IV SOLN
10.0000 mg | Freq: Once | INTRAVENOUS | Status: AC
  Administered 2016-05-24: 10 mg via INTRAVENOUS
  Filled 2016-05-24: qty 1

## 2016-05-24 MED ORDER — SODIUM CHLORIDE 0.9% FLUSH
10.0000 mL | INTRAVENOUS | Status: DC | PRN
  Administered 2016-05-24: 10 mL
  Filled 2016-05-24: qty 10

## 2016-05-24 MED ORDER — HEPARIN SOD (PORK) LOCK FLUSH 100 UNIT/ML IV SOLN
500.0000 [IU] | Freq: Once | INTRAVENOUS | Status: AC | PRN
  Administered 2016-05-24: 500 [IU]
  Filled 2016-05-24: qty 5

## 2016-05-24 MED ORDER — SODIUM CHLORIDE 0.9 % IV SOLN
INTRAVENOUS | Status: AC
  Administered 2016-05-24: 14:00:00 via INTRAVENOUS

## 2016-05-24 NOTE — Progress Notes (Signed)
IMRT Device Note outpatient   ICD-9-CM ICD-10-CM   1. 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HCC) 146.1 C09.0     10.5 delivered field widths represent one set of IMRT treatment devices. The code is [phone].  -----------------------------------  Eppie Gibson, MD

## 2016-05-24 NOTE — Telephone Encounter (Signed)
This provider was stopped in the infusion area by the patient, requesting prescriptions for both Benadryl and Pepcid at the [facility] outpatient states that he already has a grant for the outpatient pharmacy; and has no money to purchase these.  2.  Over-the-counter medications.  Benadryl and Pepcid were called in to the [facility] per patient request.  Also, patient states that all of his mild shortness of breath and chest discomfort to the right upper chest area has completely resolved.

## 2016-05-24 NOTE — Patient Instructions (Signed)
[facility] Discharge Instructions for Patients Receiving Chemotherapy  Today you received the following chemotherapy agents:  Only IV fluids given today  To help prevent nausea and vomiting after your treatment, we encourage you to take your nausea medication as prescribed.   If you develop nausea and vomiting that is not controlled by your nausea medication, call the clinic.   BELOW ARE SYMPTOMS THAT SHOULD BE REPORTED IMMEDIATELY:  *FEVER GREATER THAN 100.5 F  *CHILLS WITH OR WITHOUT FEVER  NAUSEA AND VOMITING THAT IS NOT CONTROLLED WITH YOUR NAUSEA MEDICATION  *UNUSUAL SHORTNESS OF BREATH  *UNUSUAL BRUISING OR BLEEDING  TENDERNESS IN MOUTH AND THROAT WITH OR WITHOUT PRESENCE OF ULCERS  *URINARY PROBLEMS  *BOWEL PROBLEMS  UNUSUAL RASH Items with * indicate a potential emergency and should be followed up as soon as possible.  Feel free to call the clinic you have any questions or concerns. The clinic phone number is [phone].  Please show the [place] at check-in to the Emergency Department and triage nurse.

## 2016-05-25 ENCOUNTER — Ambulatory Visit
Admission: RE | Admit: 2016-05-25 | Discharge: 2016-05-25 | Disposition: A | Discharge status: Home / Self Care | Payer: Medicaid Other | Source: Ambulatory Visit | Attending: Radiation Oncology | Admitting: Radiation Oncology

## 2016-05-25 ENCOUNTER — Telehealth: Payer: Self-pay | Admitting: *Deleted

## 2016-05-25 ENCOUNTER — Ambulatory Visit: Payer: Medicaid Other

## 2016-05-25 DIAGNOSIS — Z510 Encounter for antineoplastic radiation therapy: Secondary | ICD-10-CM | POA: Diagnosis not present

## 2016-05-25 NOTE — Progress Notes (Signed)
Oncology Nurse Navigator Documentation  Met with [name] [name] in Infusion during his initial chemotherapy. He indicated he was tolerating procedure without difficulty. He denied issues with PEG, minimal discomfort, conducting daily flushes without difficulty. He understands he will have initial RT later this afternoon.  Gayleen Orem, RN, BSN, [place] at [place] [phone]

## 2016-05-25 NOTE — Telephone Encounter (Signed)
On 05-25-16 fax medical records to disability determination services, it was consult note, sim and planning note.

## 2016-05-26 ENCOUNTER — Ambulatory Visit: Payer: Medicaid Other

## 2016-05-26 ENCOUNTER — Ambulatory Visit
Admission: RE | Admit: 2016-05-26 | Discharge: 2016-05-26 | Disposition: A | Discharge status: Home / Self Care | Payer: Medicaid Other | Source: Ambulatory Visit | Attending: Radiation Oncology | Admitting: Radiation Oncology

## 2016-05-26 DIAGNOSIS — Z510 Encounter for antineoplastic radiation therapy: Secondary | ICD-10-CM | POA: Diagnosis not present

## 2016-05-29 ENCOUNTER — Ambulatory Visit
Admission: RE | Admit: 2016-05-29 | Discharge: 2016-05-29 | Disposition: A | Discharge status: Home / Self Care | Payer: Medicaid Other | Source: Ambulatory Visit | Attending: Radiation Oncology | Admitting: Radiation Oncology

## 2016-05-29 ENCOUNTER — Encounter: Payer: Self-pay | Admitting: Radiation Oncology

## 2016-05-29 VITALS — BP 117/78 | HR 69 | Temp 98.0°F | Resp 20 | Wt 157.8 lb

## 2016-05-29 DIAGNOSIS — Z510 Encounter for antineoplastic radiation therapy: Secondary | ICD-10-CM | POA: Diagnosis not present

## 2016-05-29 DIAGNOSIS — C090 Malignant neoplasm of tonsillar fossa: Secondary | ICD-10-CM

## 2016-05-29 MED ORDER — LIDOCAINE VISCOUS 2 % MT SOLN: OROMUCOSAL | 5 refills | Status: DC

## 2016-05-29 MED ORDER — SUCRALFATE 1 G PO TABS: ORAL_TABLET | ORAL | 5 refills | Status: DC

## 2016-05-29 MED FILL — LIDOCAINE 2% VISCOUS SOLN: 2 | 5 days supply | Qty: 100 | Fill #0

## 2016-05-29 MED FILL — SUCRALFATE 1 GM TABLET: 1 | 10 days supply | Qty: 40 | Fill #0

## 2016-05-29 NOTE — Progress Notes (Signed)
Weekly rad tx head/neck, bilateral,  No skin changes on neck, uses sonafine daily after  Radiation, no difficulty swallowing, just getting nauseated, takes zofran/comapazine prn, appetite good,eating solid foods, drinking water, and ensures 2 daily,  Peg site good, flushes with water daily no c/opain at present 8:03 AM BP 117/78 (BP Location: Left Arm, Patient Position: Sitting, Cuff Size: Normal)   Pulse 69   Temp 98 F (36.7 C) (Oral)   Resp 20   Wt 157 lb 12.8 oz (71.6 kg)   SpO2 100% Comment: room air  BMI 20.54 kg/m   Wt Readings from Last 3 Encounters:  05/29/16 157 lb 12.8 oz (71.6 kg)  05/18/16 156 lb 11.2 oz (71.1 kg)  05/16/16 161 lb 8 oz (73.3 kg)

## 2016-05-29 NOTE — Progress Notes (Signed)
   Weekly Management Note:  Outpatient    ICD-9-CM ICD-10-CM   1. 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  CBCT/MVCT images/Port film x-rays were reviewed.  The chart was checked. Nausea helped by anti emetics from med onc.  Anxiety at treatment helped by Ativan.  Mild soreness in throat.  Physical Findings:  Wt Readings from Last 3 Encounters:  05/29/16 157 lb 12.8 oz (71.6 kg)  05/18/16 156 lb 11.2 oz (71.1 kg)  05/16/16 161 lb 8 oz (73.3 kg)    weight is 157 lb 12.8 oz (71.6 kg). His oral temperature is 98 F (36.7 C). His blood pressure is 117/78 and his pulse is 69. His respiration is 20 and oxygen saturation is 100%.  NAD, swelling of left tonsil.  Left upper neck level II mass palpated.  CBC    Component Value Date/Time   WBC 9.1 05/17/2016 1250   RBC 4.78 05/17/2016 1250   HGB 15.2 05/17/2016 1250   HCT 43.3 05/17/2016 1250   PLT 273 05/17/2016 1250   MCV 90.6 05/17/2016 1250   MCH 31.8 05/17/2016 1250   MCHC 35.1 05/17/2016 1250   RDW 13.0 05/17/2016 1250   LYMPHSABS 3.3 05/17/2016 1250   MONOABS 0.6 05/17/2016 1250   EOSABS 0.2 05/17/2016 1250   BASOSABS 0.0 05/17/2016 1250     CMP     Component Value Date/Time   NA 138 05/17/2016 1250   K 4.3 05/17/2016 1250   CL 105 05/17/2016 1250   CO2 25 05/17/2016 1250   GLUCOSE 86 05/17/2016 1250   BUN 14 05/17/2016 1250   CREATININE 0.72 05/17/2016 1250   CALCIUM 9.7 05/17/2016 1250   PROT 8.3 (H) 04/27/2016 1155   ALBUMIN 4.9 04/27/2016 1155   AST 27 04/27/2016 1155   ALT 21 04/27/2016 1155   ALKPHOS 52 04/27/2016 1155   BILITOT 0.7 04/27/2016 1155   GFRNONAA >60 05/17/2016 1250   GFRAA >60 05/17/2016 1250     Impression:  The patient is tolerating radiotherapy.   Plan:  Continue radiotherapy  as planned.  Rx as above for throat soreness.  -----------------------------------  Eppie Gibson, MD

## 2016-05-30 ENCOUNTER — Ambulatory Visit (HOSPITAL_BASED_OUTPATIENT_CLINIC_OR_DEPARTMENT_OTHER): Payer: No Typology Code available for payment source | Admitting: Hematology and Oncology

## 2016-05-30 ENCOUNTER — Encounter: Payer: Self-pay | Admitting: Hematology and Oncology

## 2016-05-30 ENCOUNTER — Ambulatory Visit
Admission: RE | Admit: 2016-05-30 | Discharge: 2016-05-30 | Disposition: A | Discharge status: Home / Self Care | Payer: Medicaid Other | Source: Ambulatory Visit | Attending: Radiation Oncology | Admitting: Radiation Oncology

## 2016-05-30 DIAGNOSIS — H9313 Tinnitus, bilateral: Secondary | ICD-10-CM

## 2016-05-30 DIAGNOSIS — T451X5A Adverse effect of antineoplastic and immunosuppressive drugs, initial encounter: Secondary | ICD-10-CM

## 2016-05-30 DIAGNOSIS — Z510 Encounter for antineoplastic radiation therapy: Secondary | ICD-10-CM | POA: Diagnosis not present

## 2016-05-30 DIAGNOSIS — R634 Abnormal weight loss: Secondary | ICD-10-CM

## 2016-05-30 DIAGNOSIS — C090 Malignant neoplasm of tonsillar fossa: Secondary | ICD-10-CM

## 2016-05-30 DIAGNOSIS — G893 Neoplasm related pain (acute) (chronic): Secondary | ICD-10-CM

## 2016-05-30 DIAGNOSIS — R110 Nausea: Secondary | ICD-10-CM

## 2016-05-30 DIAGNOSIS — K5900 Constipation, unspecified: Secondary | ICD-10-CM

## 2016-05-30 NOTE — Assessment & Plan Note (Signed)
He has progressive recent weight loss related to nausea. I recommend him to start using the feeding tube and increase dietary supplement in between meals He will continue regular visits with dietitian

## 2016-05-30 NOTE — Assessment & Plan Note (Signed)
This is an expected side effects from cisplatin. I will observe only. If it is still persistent or worsening next week, I might have to reduce the dose of his chemotherapy

## 2016-05-30 NOTE — Assessment & Plan Note (Signed)
He has some mild mucositis, expected side effects of treatment. I recommend he takes liquid morphine sulfate as needed for pain

## 2016-05-30 NOTE — Assessment & Plan Note (Signed)
He had mild nausea but no vomiting. I recommend increase anti-emetics use as needed

## 2016-05-30 NOTE — Progress Notes (Signed)
[facility] OFFICE PROGRESS NOTE  Patient Care Team: Rogers Blocker, MD as PCP - General (Internal Medicine) Heath Lark, MD as Consulting Physician (Hematology and Oncology) Jodi Marble, MD as Consulting Physician (Otolaryngology) Leota Sauers, RN as Oncology Nurse Navigator Eppie Gibson, MD as Attending Physician (Radiation Oncology) Lenn Cal, DDS as Consulting Physician (Dentistry) Karie Mainland, RD as Dietitian (Nutrition)  SUMMARY OF ONCOLOGIC HISTORY:   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[place])   04/04/2016 Initial Diagnosis    He saw ENT and had laryngoscopy and biopsy of left tonsil      04/04/2016 Pathology Results    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 Chronic small-vessel ischemic changes of the cerebral hemispheric white matter.       04/04/2016 Imaging    CT neck showed left tonsillar mass with diameter of 19 x 24 mm consistent with 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 necrosis. Suspicious level 5/supraclavicular nodes on the left. Scar type density in the right upper lobe is more prominent than was seen on a CT scan of the chest 10/06/2014      04/21/2016 PET scan    Asymmetric hypermetabolic soft tissue prominence in left palatine tonsil, consistent with known primary left tonsillar squamous cell Carcinoma. Left level 2 cervical lymphadenopathy, consistent with metastatic disease. No evidence of metastatic disease within the chest, abdomen, or pelvis. Stable 14 mm ground-glass and part solid nodular opacity in right lung apex compared to previous CT in 2016. This shows low-grade metabolic activity. Low-grade bronchogenic adenocarcinoma cannot be excluded. Consider surgical resection versus continued followup by CT in 12 months      05/17/2016 Procedure    Successful placement of a 20 French pull  through gastrostomy tube & right IJ approach [facility] [place].      05/22/2016 -  Chemotherapy    The patient had palonosetron (ALOXI) injection 0.25 mg, 0.25 mg, Intravenous,  Once, 1 of 3 cycles  CISplatin (PLATINOL) 192 mg in sodium chloride 0.9 % 500 mL chemo infusion, 100 mg/m2 = 192 mg, Intravenous,  Once, 1 of 3 cycles  fosaprepitant (EMEND) 150 mg, dexamethasone (DECADRON) 12 mg in sodium chloride 0.9 % 145 mL IVPB, , Intravenous,  Once, 1 of 3 cycles  for chemotherapy treatment.        05/23/2016 -  Radiation Therapy    He received concurrent radiation therapy       INTERVAL HISTORY: Please see below for problem oriented charting. He is seen as part of his weekly supportive care visit. He has lost 6 pounds in 2 weeks due to reduced appetite. He has bilateral tinnitus intermittently since chemotherapy. He has some nausea but no vomiting. He had recent constipation, resolved with laxative. He had mild mucositis pain but is not using pain medicine regularly.  REVIEW OF SYSTEMS:   Constitutional: Denies fevers, chills Eyes: Denies blurriness of vision Respiratory: Denies cough, dyspnea or wheezes Cardiovascular: Denies palpitation, chest discomfort or lower extremity swelling Skin: Denies abnormal skin rashes Lymphatics: Denies new lymphadenopathy or easy bruising Behavioral/Psych: Mood is stable, no new changes  All other systems were reviewed with the patient and are negative.  I have reviewed the past medical history, past surgical history, social history and family history with the patient and they are unchanged from previous note.  ALLERGIES:  has No Known Allergies.  MEDICATIONS:  Current Outpatient Prescriptions  Medication Sig Dispense Refill  . fentaNYL (DURAGESIC - DOSED MCG/HR) 25 MCG/HR patch Place 1 patch (25 mcg total) onto the skin every 3 (three) days. 5 patch 0  . lidocaine (XYLOCAINE) 2 % solution Patient: Mix 1part 2% viscous lidocaine, 1part H20. Swallow  44mL of this mixture, 21min before meals and at bedtime, up to QID PRN soreness 100 mL 5  . lidocaine-prilocaine (EMLA) cream Apply to affected area once 30 g 3  . LORazepam (ATIVAN) 0.5 MG tablet Take 1 tablet up to BID PRN nausea or anxiety.  Can be taken 30 min before radiotherapy for anxiety. 50 tablet 0  . Morphine Sulfate (MORPHINE CONCENTRATE) 10 mg / 0.5 ml concentrated solution Take 0.5 mLs (10 mg total) by mouth every 2 (two) hours as needed for severe pain. 240 mL 0  . ondansetron (ZOFRAN) 8 MG tablet Take 1 tablet (8 mg total) by mouth every 8 (eight) hours as needed. Start on the third day after chemotherapy. 60 tablet 1  . prochlorperazine (COMPAZINE) 10 MG tablet Take 1 tablet (10 mg total) by mouth every 6 (six) hours as needed (Nausea or vomiting). 60 tablet 1  . sodium fluoride (FLUORISHIELD) 1.1 % GEL dental gel Instill one drop of gel per tooth space of fluoride tray. Place over teeth for 5 minutes. Remove. Spit out excess. Repeat nightly. 120 mL prn  . sucralfate (CARAFATE) 1 g tablet Dissolve 1 tablet in 10 mL H20 and swallow up to QID PRN sore throat 40 tablet 5   No current facility-administered medications for this visit.    Facility-Administered Medications Ordered in Other Visits  Medication Dose Route Frequency Provider Last Rate Last Dose  . sodium chloride flush (NS) 0.9 % injection 10 mL  10 mL Intracatheter PRN Heath Lark, MD   10 mL at 05/24/16 1615    PHYSICAL EXAMINATION: ECOG PERFORMANCE STATUS: 1 - Symptomatic but completely ambulatory  Vitals:   05/30/16 0900  BP: 110/74  Pulse: (!) 54  Resp: 18  Temp: 98.1 F (36.7 C)   Filed Weights   05/30/16 0900  Weight: 156 lb 9.6 oz (71 kg)    GENERAL:alert, no distress and comfortable SKIN: skin color, texture, turgor are normal, no rashes or significant lesions EYES: normal, Conjunctiva are pink and non-injected, sclera clear OROPHARYNX:no exudate, no erythema and lips, buccal mucosa, and tongue normal   NECK: supple, thyroid normal size, non-tender, without nodularity LYMPH:  Lymphadenopathy on his neck is smaller LUNGS: clear to auscultation and percussion with normal breathing effort HEART: regular rate & rhythm and no murmurs and no lower extremity edema ABDOMEN:abdomen soft, non-tender and normal bowel sounds. Feeding tube site looks okay Musculoskeletal:no cyanosis of digits and no clubbing  NEURO: alert & oriented x 3 with fluent speech, no focal motor/sensory deficits  LABORATORY DATA:  I have reviewed the data as listed    Component Value Date/Time   NA 138 05/17/2016 1250   K 4.3 05/17/2016 1250   CL 105 05/17/2016 1250   CO2 25 05/17/2016 1250   GLUCOSE 86 05/17/2016 1250   BUN 14 05/17/2016 1250   CREATININE 0.72 05/17/2016 1250   CALCIUM 9.7 05/17/2016 1250   PROT 8.3 (H) 04/27/2016 1155   ALBUMIN 4.9 04/27/2016 1155   AST 27 04/27/2016 1155   ALT 21 04/27/2016 1155   ALKPHOS 52 04/27/2016 1155   BILITOT 0.7 04/27/2016 1155   GFRNONAA >60 05/17/2016 1250   GFRAA >60 05/17/2016 1250  No results found for: SPEP, UPEP  Lab Results  Component Value Date   WBC 9.1 05/17/2016   NEUTROABS 5.0 05/17/2016   HGB 15.2 05/17/2016   HCT 43.3 05/17/2016   MCV 90.6 05/17/2016   PLT 273 05/17/2016      Chemistry      Component Value Date/Time   NA 138 05/17/2016 1250   K 4.3 05/17/2016 1250   CL 105 05/17/2016 1250   CO2 25 05/17/2016 1250   BUN 14 05/17/2016 1250   CREATININE 0.72 05/17/2016 1250      Component Value Date/Time   CALCIUM 9.7 05/17/2016 1250   ALKPHOS 52 04/27/2016 1155   AST 27 04/27/2016 1155   ALT 21 04/27/2016 1155   BILITOT 0.7 04/27/2016 1155      ASSESSMENT & PLAN:  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HCC) He tolerated treatment well with mild expected side effects. He will continue to be seen on a weekly basis for supportive care  Bilateral tinnitus This is an expected side effects from cisplatin. I will observe only. If  it is still persistent or worsening next week, I might have to reduce the dose of his chemotherapy  Cancer associated pain He has some mild mucositis, expected side effects of treatment. I recommend he takes liquid morphine sulfate as needed for pain  Chemotherapy-induced nausea He had mild nausea but no vomiting. I recommend increase anti-emetics use as needed  Constipation, acute He has new onset of acute constipation recently. This can contribute to reduced appetite. I recommend regular use of laxatives  Weight loss He has progressive recent weight loss related to nausea. I recommend him to start using the feeding tube and increase dietary supplement in between meals He will continue regular visits with dietitian   No orders of the defined types were placed in this encounter.  All questions were answered. The patient knows to call the clinic with any problems, questions or concerns. No barriers to learning was detected. I spent 25 minutes counseling the patient face to face. The total time spent in the appointment was 30 minutes and more than 50% was on counseling and review of test results     Heath Lark, MD 05/30/2016 9:19 AM

## 2016-05-30 NOTE — Assessment & Plan Note (Signed)
He has new onset of acute constipation recently. This can contribute to reduced appetite. I recommend regular use of laxatives

## 2016-05-30 NOTE — Assessment & Plan Note (Signed)
He tolerated treatment well with mild expected side effects. He will continue to be seen on a weekly basis for supportive care

## 2016-05-31 ENCOUNTER — Ambulatory Visit
Admission: RE | Admit: 2016-05-31 | Discharge: 2016-05-31 | Disposition: A | Discharge status: Home / Self Care | Payer: Medicaid Other | Source: Ambulatory Visit | Attending: Radiation Oncology | Admitting: Radiation Oncology

## 2016-05-31 DIAGNOSIS — Z510 Encounter for antineoplastic radiation therapy: Secondary | ICD-10-CM | POA: Diagnosis not present

## 2016-06-01 ENCOUNTER — Encounter: Payer: Self-pay | Admitting: *Deleted

## 2016-06-01 ENCOUNTER — Telehealth: Payer: Self-pay | Admitting: *Deleted

## 2016-06-01 ENCOUNTER — Ambulatory Visit
Admission: RE | Admit: 2016-06-01 | Discharge: 2016-06-01 | Disposition: A | Discharge status: Home / Self Care | Payer: Medicaid Other | Source: Ambulatory Visit | Attending: Radiation Oncology | Admitting: Radiation Oncology

## 2016-06-01 DIAGNOSIS — Z510 Encounter for antineoplastic radiation therapy: Secondary | ICD-10-CM | POA: Diagnosis not present

## 2016-06-01 NOTE — Progress Notes (Signed)
Oncology Nurse Navigator Documentation  Met with Mr. Rindfleisch during scheduled [facility] appointment.   He reported:  Tolerating RT without issues.  Applying Sonafine BID - following treatment when he goes home and before bed.  Conducting swallowing exercises BID per Carl's instructions.  Using fluoride trays prior to bedtime.  Using Trismus device daily.  Experiencing minor throat pain, using Carafate PRN.  Using city bus for transport to and from [facility] at this time, plans to use [facility] later into treatment. I commended him for his compliance with activities.  "You all gave me the tools, I got to use them." I provided him an current Epic appt calendar. He understands he can contact me with needs/concerns.  Gayleen Orem, RN, BSN, [place] at [place] [phone]

## 2016-06-01 NOTE — Telephone Encounter (Signed)
Oncology Nurse Navigator Documentation  Called Mr. Sia to inform 1) 10/16 appt with SLP Glendell Docker has been changed from [place] location to [employer], 2) RT 8:00 RT had been changed to 2:40.  He voiced understanding.  Gayleen Orem, RN, BSN, [place] at [place] [phone]

## 2016-06-02 ENCOUNTER — Ambulatory Visit
Admission: RE | Admit: 2016-06-02 | Discharge: 2016-06-02 | Disposition: A | Discharge status: Home / Self Care | Payer: Medicaid Other | Source: Ambulatory Visit | Attending: Radiation Oncology | Admitting: Radiation Oncology

## 2016-06-02 DIAGNOSIS — Z510 Encounter for antineoplastic radiation therapy: Secondary | ICD-10-CM | POA: Diagnosis not present

## 2016-06-05 ENCOUNTER — Ambulatory Visit
Admission: RE | Admit: 2016-06-05 | Discharge: 2016-06-05 | Disposition: A | Discharge status: Home / Self Care | Payer: Medicaid Other | Source: Ambulatory Visit | Attending: Radiation Oncology | Admitting: Radiation Oncology

## 2016-06-05 ENCOUNTER — Encounter: Payer: Self-pay | Admitting: Radiation Oncology

## 2016-06-05 VITALS — BP 117/95 | HR 61 | Temp 98.2°F | Ht 73.5 in | Wt 158.8 lb

## 2016-06-05 DIAGNOSIS — C090 Malignant neoplasm of tonsillar fossa: Secondary | ICD-10-CM

## 2016-06-05 DIAGNOSIS — Z510 Encounter for antineoplastic radiation therapy: Secondary | ICD-10-CM | POA: Diagnosis not present

## 2016-06-05 NOTE — Progress Notes (Signed)
[name] [name] has completed 10 fractions to his left tonsil.  He reports having a slight headache today.  He also reports having a slight sore throat and is not using any pain medication now except for occasional lidocaine.  He reports having thick saliva and is using a mouthwash that is helping.  He reports his taste buds are changing.  He is able to eat what he wants without limitation.  He has a feeding tube and is flushing it.  He denies having any redness around the insertion site.  He will have chemotherapy next week.  He reports the "knot" in his left neck is shrinking.  The skin on his left neck is intact.  He is using sonafine.  BP (!) 117/95 (BP Location: Right Arm, Patient Position: Standing)   Pulse 61   Temp 98.2 F (36.8 C) (Oral)   Ht 6' 1.5" (1.867 m)   Wt 158 lb 12.8 oz (72 kg)   SpO2 100%   BMI 20.67 kg/m    Wt Readings from Last 3 Encounters:  06/05/16 158 lb 12.8 oz (72 kg)  05/30/16 156 lb 9.6 oz (71 kg)  05/29/16 157 lb 12.8 oz (71.6 kg)

## 2016-06-05 NOTE — Progress Notes (Signed)
   Weekly Management Note:  Outpatient    ICD-9-CM ICD-10-CM   1. 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  CBCT/MVCT images/Port film x-rays were reviewed.  The chart was checked. Nausea helped by anti emetics from med onc.  Doing well.  Eating well.  Thick saliva and taste changes.  Slight HA and sore throat today.  Physical Findings:  Wt Readings from Last 3 Encounters:  06/05/16 158 lb 12.8 oz (72 kg)  05/30/16 156 lb 9.6 oz (71 kg)  05/29/16 157 lb 12.8 oz (71.6 kg)    height is 6' 1.5" (1.867 m) and weight is 158 lb 12.8 oz (72 kg). His oral temperature is 98.2 F (36.8 C). His blood pressure is 117/95 (abnormal) and his pulse is 61. His oxygen saturation is 100%.  NAD, very mild swelling of left tonsil.  Left upper neck level II mass palpated, smaller than last week.  CBC    Component Value Date/Time   WBC 9.1 05/17/2016 1250   RBC 4.78 05/17/2016 1250   HGB 15.2 05/17/2016 1250   HCT 43.3 05/17/2016 1250   PLT 273 05/17/2016 1250   MCV 90.6 05/17/2016 1250   MCH 31.8 05/17/2016 1250   MCHC 35.1 05/17/2016 1250   RDW 13.0 05/17/2016 1250   LYMPHSABS 3.3 05/17/2016 1250   MONOABS 0.6 05/17/2016 1250   EOSABS 0.2 05/17/2016 1250   BASOSABS 0.0 05/17/2016 1250     CMP     Component Value Date/Time   NA 138 05/17/2016 1250   K 4.3 05/17/2016 1250   CL 105 05/17/2016 1250   CO2 25 05/17/2016 1250   GLUCOSE 86 05/17/2016 1250   BUN 14 05/17/2016 1250   CREATININE 0.72 05/17/2016 1250   CALCIUM 9.7 05/17/2016 1250   PROT 8.3 (H) 04/27/2016 1155   ALBUMIN 4.9 04/27/2016 1155   AST 27 04/27/2016 1155   ALT 21 04/27/2016 1155   ALKPHOS 52 04/27/2016 1155   BILITOT 0.7 04/27/2016 1155   GFRNONAA >60 05/17/2016 1250   GFRAA >60 05/17/2016 1250     Impression:  The patient is tolerating radiotherapy.   Plan:  Continue  radiotherapy as planned.  Discussed prior Rx's for throat soreness.  -----------------------------------  Eppie Gibson, MD

## 2016-06-06 ENCOUNTER — Ambulatory Visit (HOSPITAL_COMMUNITY): Payer: No Typology Code available for payment source | Admitting: Dentistry

## 2016-06-06 ENCOUNTER — Encounter (HOSPITAL_COMMUNITY): Payer: Self-pay | Admitting: Dentistry

## 2016-06-06 ENCOUNTER — Telehealth: Payer: Self-pay | Admitting: Hematology and Oncology

## 2016-06-06 ENCOUNTER — Ambulatory Visit
Admission: RE | Admit: 2016-06-06 | Discharge: 2016-06-06 | Disposition: A | Discharge status: Home / Self Care | Payer: Medicaid Other | Source: Ambulatory Visit | Attending: Radiation Oncology | Admitting: Radiation Oncology

## 2016-06-06 ENCOUNTER — Ambulatory Visit (HOSPITAL_BASED_OUTPATIENT_CLINIC_OR_DEPARTMENT_OTHER): Payer: No Typology Code available for payment source | Admitting: Hematology and Oncology

## 2016-06-06 ENCOUNTER — Encounter: Payer: Self-pay | Admitting: Hematology and Oncology

## 2016-06-06 VITALS — BP 116/89 | HR 57 | Temp 98.8°F | Wt 159.0 lb

## 2016-06-06 DIAGNOSIS — T451X5A Adverse effect of antineoplastic and immunosuppressive drugs, initial encounter: Secondary | ICD-10-CM

## 2016-06-06 DIAGNOSIS — Z931 Gastrostomy status: Secondary | ICD-10-CM

## 2016-06-06 DIAGNOSIS — R432 Parageusia: Secondary | ICD-10-CM

## 2016-06-06 DIAGNOSIS — R1310 Dysphagia, unspecified: Secondary | ICD-10-CM | POA: Diagnosis not present

## 2016-06-06 DIAGNOSIS — R682 Dry mouth, unspecified: Secondary | ICD-10-CM

## 2016-06-06 DIAGNOSIS — C090 Malignant neoplasm of tonsillar fossa: Secondary | ICD-10-CM

## 2016-06-06 DIAGNOSIS — R110 Nausea: Secondary | ICD-10-CM

## 2016-06-06 DIAGNOSIS — K117 Disturbances of salivary secretion: Secondary | ICD-10-CM

## 2016-06-06 DIAGNOSIS — Z510 Encounter for antineoplastic radiation therapy: Secondary | ICD-10-CM | POA: Diagnosis not present

## 2016-06-06 DIAGNOSIS — K08409 Partial loss of teeth, unspecified cause, unspecified class: Secondary | ICD-10-CM

## 2016-06-06 DIAGNOSIS — H9313 Tinnitus, bilateral: Secondary | ICD-10-CM

## 2016-06-06 DIAGNOSIS — K030 Excessive attrition of teeth: Secondary | ICD-10-CM

## 2016-06-06 NOTE — Progress Notes (Signed)
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 Chronic small-vessel ischemic changes of the cerebral hemispheric white matter.       04/04/2016 Imaging    CT neck showed left tonsillar mass with diameter of 19 x 24 mm consistent with 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 necrosis. Suspicious level 5/supraclavicular nodes on the left. Scar type density in the right upper lobe is more prominent than was seen on a CT scan of the chest 10/06/2014      04/21/2016 PET scan    Asymmetric hypermetabolic soft tissue prominence in left palatine tonsil, consistent with known primary left tonsillar squamous cell Carcinoma. Left level 2 cervical lymphadenopathy, consistent with metastatic disease. No evidence of metastatic disease within the chest, abdomen, or pelvis. Stable 14 mm ground-glass and part solid nodular opacity in right lung apex compared to previous CT in 2016. This shows low-grade metabolic activity. Low-grade bronchogenic adenocarcinoma cannot be excluded. Consider surgical resection versus continued followup by CT in 12 months      05/17/2016 Procedure    Successful placement of a 20 French pull  through gastrostomy tube & right IJ approach [facility] [place].      05/22/2016 -  Chemotherapy    The patient had palonosetron (ALOXI) injection 0.25 mg, 0.25 mg, Intravenous,  Once, 1 of 3 cycles  CISplatin (PLATINOL) 192 mg in sodium chloride 0.9 % 500 mL chemo infusion, 100 mg/m2 = 192 mg, Intravenous,  Once, 1 of 3 cycles  fosaprepitant (EMEND) 150 mg, dexamethasone (DECADRON) 12 mg in sodium chloride 0.9 % 145 mL IVPB, , Intravenous,  Once, 1 of 3 cycles  for chemotherapy treatment.        05/23/2016 -  Radiation Therapy    He received concurrent radiation therapy       INTERVAL HISTORY: Please see below for problem oriented charting. He is seen as part of his weekly supportive care visit. He has gained some weight since I saw him  He has some nausea but no vomiting. He had recent constipation, resolved with laxative. He had mild mucositis pain but is not using pain medicine regularly.  REVIEW OF SYSTEMS:   Constitutional: Denies fevers, chills or abnormal weight loss Eyes: Denies blurriness of vision Respiratory: Denies cough, dyspnea or wheezes Cardiovascular: Denies palpitation, chest discomfort or lower extremity swelling Skin: Denies abnormal skin rashes Lymphatics: Denies new lymphadenopathy or easy bruising Neurological:Denies numbness, tingling or new weaknesses Behavioral/Psych: Mood is stable, no new changes  All other systems were reviewed with the patient and are negative.  I have reviewed the past medical history, past surgical history, social history and family history with the patient and they are unchanged from previous note.  ALLERGIES:  has No Known Allergies.  MEDICATIONS:  Current Outpatient Prescriptions  Medication Sig Dispense Refill  . lidocaine (XYLOCAINE) 2 % solution Patient: Mix 1part 2% viscous lidocaine, 1part H20. Swallow 83mL of this mixture, 45min before meals and at bedtime, up to QID PRN soreness 100 mL 5  . lidocaine-prilocaine (EMLA)  cream Apply to affected area once 30 g 3  . LORazepam (ATIVAN) 0.5 MG tablet Take 1 tablet up to BID PRN nausea or anxiety.  Can be taken 30 min before radiotherapy for anxiety. 50 tablet 0  . ondansetron (ZOFRAN) 8 MG tablet Take 1 tablet (8 mg total) by mouth every 8 (eight) hours as needed. Start on the third day after chemotherapy. 60 tablet 1  . sodium fluoride (FLUORISHIELD) 1.1 % GEL dental gel Instill one drop of gel per tooth space of fluoride tray. Place over teeth for 5 minutes. Remove. Spit out excess. Repeat nightly. 120 mL prn  . sucralfate (CARAFATE) 1 g tablet Dissolve 1 tablet in 10 mL H20 and swallow up to QID PRN sore throat 40 tablet 5  . fentaNYL (DURAGESIC - DOSED MCG/HR) 25 MCG/HR patch Place 1 patch (25 mcg total) onto the skin every 3 (three) days. (Patient not taking: Reported on 06/06/2016) 5 patch 0  . Morphine Sulfate (MORPHINE CONCENTRATE) 10 mg / 0.5 ml concentrated solution Take 0.5 mLs (10 mg total) by mouth every 2 (two) hours as needed for severe pain. (Patient not taking: Reported on 06/06/2016) 240 mL 0  . prochlorperazine (COMPAZINE) 10 MG tablet Take 1 tablet (10 mg total) by mouth every 6 (six) hours as needed (Nausea or vomiting). (Patient not taking: Reported on 06/06/2016) 60 tablet 1   No current facility-administered medications for this visit.    Facility-Administered Medications Ordered in Other Visits  Medication Dose Route Frequency Provider Last Rate Last Dose  . sodium chloride flush (NS) 0.9 % injection 10 mL  10 mL Intracatheter PRN Heath Lark, MD   10 mL at 05/24/16 1615    PHYSICAL EXAMINATION: ECOG PERFORMANCE STATUS: 0 - Asymptomatic  Vitals:   06/06/16 0848  BP: 112/80  Pulse: (!) 59  Resp: 18  Temp: 97.9 F (36.6 C)   Filed Weights   06/06/16 0848  Weight: 159 lb (72.1 kg)    GENERAL:alert, no distress and comfortable SKIN: skin color, texture, turgor are normal, no rashes or significant lesions EYES: normal, Conjunctiva are  pink and non-injected, sclera clear OROPHARYNX:no exudate, no erythema and lips, buccal mucosa, and tongue normal  NECK: supple, thyroid normal size, non-tender, without nodularity LYMPH:  Previously palpable lymphadenopathy has reduced in size  LUNGS: clear to auscultation and percussion with normal breathing effort HEART: regular rate & rhythm and no murmurs and no lower extremity edema ABDOMEN:abdomen soft, non-tender and normal bowel sounds. Feeding tube site looks okay Musculoskeletal:no cyanosis of digits and no clubbing  NEURO: alert & oriented x 3 with fluent speech, no focal motor/sensory deficits  LABORATORY DATA:  I have reviewed the data as listed    Component Value Date/Time   NA 138 05/17/2016 1250   K 4.3 05/17/2016 1250   CL 105 05/17/2016 1250   CO2 25 05/17/2016 1250   GLUCOSE 86 05/17/2016 1250   BUN 14 05/17/2016 1250   CREATININE 0.72 05/17/2016 1250   CALCIUM 9.7 05/17/2016 1250   PROT 8.3 (H) 04/27/2016 1155   ALBUMIN 4.9 04/27/2016 1155   AST 27 04/27/2016 1155   ALT 21 04/27/2016 1155   ALKPHOS 52 04/27/2016 1155   BILITOT  0.7 04/27/2016 1155   GFRNONAA >60 05/17/2016 1250   GFRAA >60 05/17/2016 1250    No results found for: SPEP, UPEP  Lab Results  Component Value Date   WBC 9.1 05/17/2016   NEUTROABS 5.0 05/17/2016   HGB 15.2 05/17/2016   HCT 43.3 05/17/2016   MCV 90.6 05/17/2016   PLT 273 05/17/2016      Chemistry      Component Value Date/Time   NA 138 05/17/2016 1250   K 4.3 05/17/2016 1250   CL 105 05/17/2016 1250   CO2 25 05/17/2016 1250   BUN 14 05/17/2016 1250   CREATININE 0.72 05/17/2016 1250      Component Value Date/Time   CALCIUM 9.7 05/17/2016 1250   ALKPHOS 52 04/27/2016 1155   AST 27 04/27/2016 1155   ALT 21 04/27/2016 1155   BILITOT 0.7 04/27/2016 1155    ASSESSMENT & PLAN:  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[facility]) He tolerated treatment well with mild expected side effects. He will continue to be seen on a  weekly basis for supportive care  Bilateral tinnitus This is an expected side effects from cisplatin. I will observe only.  S/P gastrostomy ([place]) The feeding tube site looks clean without signs of infection. He is eating well and has gained some weight since I saw him. He will continue water flushes for now  Chemotherapy-induced nausea He had mild nausea but no vomiting. I recommend increase anti-emetics use as needed   No orders of the defined types were placed in this encounter.  All questions were answered. The patient knows to call the clinic with any problems, questions or concerns. No barriers to learning was detected. I spent 15 minutes counseling the patient face to face. The total time spent in the appointment was 20 minutes and more than 50% was on counseling and review of test results     Heath Lark, MD 06/06/2016 9:32 AM

## 2016-06-06 NOTE — Patient Instructions (Signed)
RECOMMENDATIONS: 1. Brush after meals and at bedtime.  Use fluoride at bedtime. 2. Use trismus exercises as directed. 3. Use Biotene Rinse or salt water/baking soda rinses. 4. Multiple sips of water as needed. 5. Return to clinic in two months for oral exam after chemoradiation therapy. Call if problems before then.  Ronald F. Kulinski, DDS  

## 2016-06-06 NOTE — Assessment & Plan Note (Signed)
He tolerated treatment well with mild expected side effects. He will continue to be seen on a weekly basis for supportive care

## 2016-06-06 NOTE — Assessment & Plan Note (Signed)
This is an expected side effects from cisplatin. I will observe only.

## 2016-06-06 NOTE — Progress Notes (Signed)
06/06/2016  Patient Name:   [name] [name] Date of Birth:   August 26, 1956 Medical Record Number: [id number]  BP 116/89 (BP Location: Left Arm)   Pulse (!) 57   Temp 98.8 F (37.1 C) (Oral)   Wt 159 lb (72.1 kg)   BMI 20.69 kg/m   [name] presents for oral examination during chemoradiation therapy. Patient has completed 12/35 radiation treatments. Patient has had one chemotherapy treatment.  REVIEW OF CHIEF COMPLAINTS:  DRY MOUTH: Yes HARD TO SWALLOW: Yes, at times.  HURT TO SWALLOW: Yes, at times TASTE CHANGES: Patient is losing taste SORES IN MOUTH: No TRISMUS: No problems with trismus symptoms WEIGHT: 159 pounds  HOME OH REGIMEN:  BRUSHING: Twice a day FLOSSING: Once a day RINSING: Using Biotene rinses FLUORIDE: Using fluoride at bedtime TRISMUS EXERCISES:  Maximum interincisal opening: 50 mm   DENTAL EXAM:  Oral Hygiene:(PLAQUE): Good oral hygiene noted. LOCATION OF MUCOSITIS: None noted DESCRIPTION OF SALIVA: Decreased and foamy saliva. ANY EXPOSED BONE: None noted OTHER WATCHED AREAS: Previous extraction sites, incisal attrition DX : Xerostomia, Dysgeusia, Dysphagia and Odynophagia  RECOMMENDATIONS: 1. Brush after meals and at bedtime.  Use fluoride at bedtime. 2. Use trismus exercises as directed. 3. Use Biotene Rinse or salt water/baking soda rinses. 4. Multiple sips of water as needed. 5. Return to clinic in two months for oral exam after chemoradiation therapy. Call if problems before then.   Lenn Cal, DDS

## 2016-06-06 NOTE — Assessment & Plan Note (Signed)
He had mild nausea but no vomiting. I recommend increase anti-emetics use as needed

## 2016-06-06 NOTE — Assessment & Plan Note (Signed)
The feeding tube site looks clean without signs of infection. He is eating well and has gained some weight since I saw him. He will continue water flushes for now

## 2016-06-06 NOTE — Telephone Encounter (Signed)
No 10/3 los/order/referrals

## 2016-06-07 ENCOUNTER — Ambulatory Visit
Admission: RE | Admit: 2016-06-07 | Discharge: 2016-06-07 | Disposition: A | Discharge status: Home / Self Care | Payer: Medicaid Other | Source: Ambulatory Visit | Attending: Radiation Oncology | Admitting: Radiation Oncology

## 2016-06-07 ENCOUNTER — Other Ambulatory Visit (HOSPITAL_COMMUNITY): Payer: Self-pay | Admitting: Dentistry

## 2016-06-07 DIAGNOSIS — Z510 Encounter for antineoplastic radiation therapy: Secondary | ICD-10-CM | POA: Diagnosis not present

## 2016-06-08 ENCOUNTER — Ambulatory Visit
Admission: RE | Admit: 2016-06-08 | Discharge: 2016-06-08 | Disposition: A | Discharge status: Home / Self Care | Payer: Medicaid Other | Source: Ambulatory Visit | Attending: Radiation Oncology | Admitting: Radiation Oncology

## 2016-06-08 DIAGNOSIS — Z510 Encounter for antineoplastic radiation therapy: Secondary | ICD-10-CM | POA: Diagnosis not present

## 2016-06-09 ENCOUNTER — Other Ambulatory Visit: Payer: Self-pay | Admitting: Hematology and Oncology

## 2016-06-09 ENCOUNTER — Other Ambulatory Visit (HOSPITAL_BASED_OUTPATIENT_CLINIC_OR_DEPARTMENT_OTHER): Payer: No Typology Code available for payment source

## 2016-06-09 ENCOUNTER — Ambulatory Visit
Admission: RE | Admit: 2016-06-09 | Discharge: 2016-06-09 | Disposition: A | Discharge status: Home / Self Care | Payer: Medicaid Other | Source: Ambulatory Visit | Attending: Radiation Oncology | Admitting: Radiation Oncology

## 2016-06-09 DIAGNOSIS — Z510 Encounter for antineoplastic radiation therapy: Secondary | ICD-10-CM | POA: Diagnosis not present

## 2016-06-09 DIAGNOSIS — C090 Malignant neoplasm of tonsillar fossa: Secondary | ICD-10-CM

## 2016-06-09 LAB — COMPREHENSIVE METABOLIC PANEL
ALT: 17 U/L (ref 0–55)
AST: 27 U/L (ref 5–34)
Albumin: 3.9 g/dL (ref 3.5–5.0)
Alkaline Phosphatase: 78 U/L (ref 40–150)
Anion Gap: 10 mEq/L (ref 3–11)
BUN: 16.6 mg/dL (ref 7.0–26.0)
CHLORIDE: 103 meq/L (ref 98–109)
CO2: 28 meq/L (ref 22–29)
Calcium: 9.9 mg/dL (ref 8.4–10.4)
Creatinine: 0.9 mg/dL (ref 0.7–1.3)
GLUCOSE: 77 mg/dL (ref 70–140)
POTASSIUM: 4.8 meq/L (ref 3.5–5.1)
SODIUM: 141 meq/L (ref 136–145)
Total Bilirubin: 0.56 mg/dL (ref 0.20–1.20)
Total Protein: 8 g/dL (ref 6.4–8.3)

## 2016-06-09 LAB — CBC WITH DIFFERENTIAL/PLATELET
BASO%: 0.3 % (ref 0.0–2.0)
Basophils Absolute: 0 10*3/uL (ref 0.0–0.1)
EOS ABS: 0.1 10*3/uL (ref 0.0–0.5)
EOS%: 1.8 % (ref 0.0–7.0)
HCT: 40.1 % (ref 38.4–49.9)
HEMOGLOBIN: 13.3 g/dL (ref 13.0–17.1)
LYMPH%: 16.5 % (ref 14.0–49.0)
MCH: 30.9 pg (ref 27.2–33.4)
MCHC: 33.3 g/dL (ref 32.0–36.0)
MCV: 92.9 fL (ref 79.3–98.0)
MONO#: 0.7 10*3/uL (ref 0.1–0.9)
MONO%: 13.6 % (ref 0.0–14.0)
NEUT%: 67.8 % (ref 39.0–75.0)
NEUTROS ABS: 3.3 10*3/uL (ref 1.5–6.5)
Platelets: 193 10*3/uL (ref 140–400)
RBC: 4.32 10*6/uL (ref 4.20–5.82)
RDW: 13.3 % (ref 11.0–14.6)
WBC: 4.8 10*3/uL (ref 4.0–10.3)
lymph#: 0.8 10*3/uL — ABNORMAL LOW (ref 0.9–3.3)

## 2016-06-09 LAB — MAGNESIUM: Magnesium: 2.7 mg/dl — ABNORMAL HIGH (ref 1.5–2.5)

## 2016-06-12 ENCOUNTER — Encounter: Payer: Self-pay | Admitting: *Deleted

## 2016-06-12 ENCOUNTER — Encounter: Payer: Self-pay | Admitting: Radiation Oncology

## 2016-06-12 ENCOUNTER — Ambulatory Visit
Admission: RE | Admit: 2016-06-12 | Discharge: 2016-06-12 | Disposition: A | Discharge status: Home / Self Care | Payer: Medicaid Other | Source: Ambulatory Visit | Attending: Radiation Oncology | Admitting: Radiation Oncology

## 2016-06-12 ENCOUNTER — Ambulatory Visit (HOSPITAL_BASED_OUTPATIENT_CLINIC_OR_DEPARTMENT_OTHER): Payer: No Typology Code available for payment source

## 2016-06-12 ENCOUNTER — Ambulatory Visit: Payer: No Typology Code available for payment source | Admitting: Nutrition

## 2016-06-12 VITALS — BP 115/89 | HR 63 | Temp 98.1°F | Ht 73.5 in | Wt 157.2 lb

## 2016-06-12 VITALS — BP 104/82 | HR 62 | Temp 97.9°F | Resp 16

## 2016-06-12 DIAGNOSIS — C090 Malignant neoplasm of tonsillar fossa: Secondary | ICD-10-CM

## 2016-06-12 DIAGNOSIS — Z5111 Encounter for antineoplastic chemotherapy: Secondary | ICD-10-CM

## 2016-06-12 DIAGNOSIS — Z510 Encounter for antineoplastic radiation therapy: Secondary | ICD-10-CM | POA: Diagnosis not present

## 2016-06-12 MED ORDER — POTASSIUM CHLORIDE 2 MEQ/ML IV SOLN
Freq: Once | INTRAVENOUS | Status: AC
  Administered 2016-06-12: 10:00:00 via INTRAVENOUS
  Filled 2016-06-12: qty 10

## 2016-06-12 MED ORDER — HEPARIN SOD (PORK) LOCK FLUSH 100 UNIT/ML IV SOLN
500.0000 [IU] | Freq: Once | INTRAVENOUS | Status: AC | PRN
  Administered 2016-06-12: 500 [IU]
  Filled 2016-06-12: qty 5

## 2016-06-12 MED ORDER — FOSAPREPITANT DIMEGLUMINE INJECTION 150 MG
Freq: Once | INTRAVENOUS | Status: AC
  Administered 2016-06-12: 12:00:00 via INTRAVENOUS
  Filled 2016-06-12: qty 5

## 2016-06-12 MED ORDER — SODIUM CHLORIDE 0.9% FLUSH
10.0000 mL | INTRAVENOUS | Status: DC | PRN
  Administered 2016-06-12: 10 mL
  Filled 2016-06-12: qty 10

## 2016-06-12 MED ORDER — FAMOTIDINE IN NACL 20-0.9 MG/50ML-% IV SOLN
20.0000 mg | Freq: Once | INTRAVENOUS | Status: AC
  Administered 2016-06-12: 20 mg via INTRAVENOUS

## 2016-06-12 MED ORDER — ETHYL ALCOHOL (SKIN CLEANSER) 65 % EX FOAM: CUTANEOUS | 5 refills | Status: DC

## 2016-06-12 MED ORDER — SODIUM CHLORIDE 0.9 % IV SOLN
100.0000 mg/m2 | Freq: Once | INTRAVENOUS | Status: AC
  Administered 2016-06-12: 192 mg via INTRAVENOUS
  Filled 2016-06-12: qty 192

## 2016-06-12 MED ORDER — DIPHENHYDRAMINE HCL 50 MG/ML IJ SOLN
25.0000 mg | Freq: Once | INTRAMUSCULAR | Status: AC
  Administered 2016-06-12: 25 mg via INTRAVENOUS

## 2016-06-12 MED ORDER — FAMOTIDINE IN NACL 20-0.9 MG/50ML-% IV SOLN
INTRAVENOUS | Status: AC
  Filled 2016-06-12: qty 50

## 2016-06-12 MED ORDER — PALONOSETRON HCL INJECTION 0.25 MG/5ML
0.2500 mg | Freq: Once | INTRAVENOUS | Status: AC
  Administered 2016-06-12: 0.25 mg via INTRAVENOUS

## 2016-06-12 MED ORDER — DIPHENHYDRAMINE HCL 50 MG/ML IJ SOLN
INTRAMUSCULAR | Status: AC
  Filled 2016-06-12: qty 1

## 2016-06-12 MED ORDER — PALONOSETRON HCL INJECTION 0.25 MG/5ML
INTRAVENOUS | Status: AC
  Filled 2016-06-12: qty 5

## 2016-06-12 MED FILL — SUCRALFATE 1 GM TABLET: 1 | 10 days supply | Qty: 40 | Fill #1

## 2016-06-12 MED FILL — LIDOCAINE 2% VISCOUS SOLN: 2 | 5 days supply | Qty: 100 | Fill #1

## 2016-06-12 NOTE — Progress Notes (Signed)
   Weekly Management Note:  Outpatient    ICD-9-CM ICD-10-CM   1. 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[facility]) 146.1 C09.0 Ethyl Alcohol, Skin Cleanser, (PURELL INSTANT HAND SANITIZER) 65 % FOAM    Current Dose:  30Gy  Projected Dose: 70 Gy   Narrative:  The patient presents for routine under treatment assessment.  CBCT/MVCT images/Port film x-rays were reviewed.  The chart was checked. Nausea helped by anti emetics from med onc.  Doing well.  Eating well but taste is worse and burning noted over mucosa.  Not using PEG yet. Chemo today. Not orthostatic.  Physical Findings:  Wt Readings from Last 3 Encounters:  06/12/16 157 lb 3.2 oz (71.3 kg)  06/06/16 159 lb (72.1 kg)  06/06/16 159 lb (72.1 kg)    height is 6' 1.5" (1.867 m) and weight is 157 lb 3.2 oz (71.3 kg). His oral temperature is 98.1 F (36.7 C). His blood pressure is 115/89 and his pulse is 63. His oxygen saturation is 100%.  NAD,no tumor appreciated in left tonsil.  Erythema over oropharynx.  Left upper neck level II mass palpated, similar or smaller than last week.  CBC    Component Value Date/Time   WBC 4.8 06/09/2016 0831   WBC 9.1 05/17/2016 1250   RBC 4.32 06/09/2016 0831   RBC 4.78 05/17/2016 1250   HGB 13.3 06/09/2016 0831   HCT 40.1 06/09/2016 0831   PLT 193 06/09/2016 0831   MCV 92.9 06/09/2016 0831   MCH 30.9 06/09/2016 0831   MCH 31.8 05/17/2016 1250   MCHC 33.3 06/09/2016 0831   MCHC 35.1 05/17/2016 1250   RDW 13.3 06/09/2016 0831   LYMPHSABS 0.8 (L) 06/09/2016 0831   MONOABS 0.7 06/09/2016 0831   EOSABS 0.1 06/09/2016 0831   BASOSABS 0.0 06/09/2016 0831     CMP     Component Value Date/Time   NA 141 06/09/2016 0831   K 4.8 06/09/2016 0831   CL 105 05/17/2016 1250   CO2 28 06/09/2016 0831   GLUCOSE 77 06/09/2016 0831   BUN 16.6 06/09/2016 0831   CREATININE 0.9 06/09/2016 0831   CALCIUM 9.9 06/09/2016 0831   PROT 8.0 06/09/2016 0831   ALBUMIN 3.9 06/09/2016 0831   AST 27 06/09/2016 0831     ALT 17 06/09/2016 0831   ALKPHOS 78 06/09/2016 0831   BILITOT 0.56 06/09/2016 0831   GFRNONAA >60 05/17/2016 1250   GFRAA >60 05/17/2016 1250     Impression:  The patient is tolerating radiotherapy.   Plan:  Continue radiotherapy as planned.  Discussed prior Rx's for mouth/throat soreness and discussed nutrition and foods to try that are mild. Try using PEG for supplementation.   -----------------------------------  Eppie Gibson, MD

## 2016-06-12 NOTE — Patient Instructions (Signed)
[facility] Discharge Instructions for Patients Receiving Chemotherapy  Today you received the following chemotherapy agents Cisplatin  To help prevent nausea and vomiting after your treatment, we encourage you to take your nausea medication as directed. No Zofran for 3 days. Take Compazine instead.   If you develop nausea and vomiting that is not controlled by your nausea medication, call the clinic.   BELOW ARE SYMPTOMS THAT SHOULD BE REPORTED IMMEDIATELY:  *FEVER GREATER THAN 100.5 F  *CHILLS WITH OR WITHOUT FEVER  NAUSEA AND VOMITING THAT IS NOT CONTROLLED WITH YOUR NAUSEA MEDICATION  *UNUSUAL SHORTNESS OF BREATH  *UNUSUAL BRUISING OR BLEEDING  TENDERNESS IN MOUTH AND THROAT WITH OR WITHOUT PRESENCE OF ULCERS  *URINARY PROBLEMS  *BOWEL PROBLEMS  UNUSUAL RASH Items with * indicate a potential emergency and should be followed up as soon as possible.  Feel free to call the clinic you have any questions or concerns. The clinic phone number is [phone].  Please show the [facility] at check-in to the Emergency Department and triage nurse.

## 2016-06-12 NOTE — Progress Notes (Signed)
Nutrition follow-up completed with patient during infusion for tonsil cancer. Weight is relatively stable and documented as 157.2 pounds October 9 up slightly from 156.7 pounds September 14. Patient reports he has absolutely no taste but is forcing himself to eat. He reports his throat is getting sore. Reported nausea seems to be resolved with anti-emetics but is agreeable to begin using feeding tube for additional calories and protein.  Estimated nutrition needs: 2460-2660 calories, 95-110 grams protein, 2.6 L fluid.  Nutrition diagnosis:  Predicted suboptimal energy intake continues.  Intervention: Patient was educated to begin with one can Osmolite 1.5 via feeding tube with 70 cc free water before and after bolus feeding. Patient demonstrated bolus feeding with good technique and understanding. Explained gradual progression of tube feedings to goal rate of 7 cans a day to provide 2489 cal, 104 g protein, and 2547 mL free water.  Patient will drink or flush feeding tube with an additional 240 cc free water 3 times a day. Educated patient to continue to eat and drink food and liquids as long as possible. Enforced the importance of continuing swallowing exercises. Provided second complimentary case of Ensure Plus along with a complementary case of Osmolite 1.5 for feeding tube usage.  Questions were answered.  Teach back method was used.  Monitoring, evaluation, goals:  Patient will tolerate oral intake plus tube feeding to minimize weight loss.   Next visit: Wednesday, October 18 after radiation treatment.  **Disclaimer: This note was dictated with voice recognition software. Similar sounding words can inadvertently be transcribed and this note may contain transcription errors which may not have been corrected upon publication of note.**

## 2016-06-12 NOTE — Progress Notes (Signed)
[name] [name] has completed 15 fractions to his left tonsil/bilateral neck.  He reports pain in his throat at a 3/10.  He is not taking anything except lidocaine and occasional carafate.  He reports having a dry mouth and thick saliva.  He is using the baking soda/salt rinse.  He reports his taste buds are gone.  The roof of his mouth and back of his mouth is red.  He reports his mouth is sore.  He is eating softer foods. The skin on his neck is intact.  He is using sonafine.  He reports having slight fatigue.  He will have chemotherapy today.Orthostatic vitals signs taken: bp sitting 110/82, hr 68, bp standing 115/89, hr 63.  BP 115/89 (BP Location: Left Arm, Patient Position: Standing)   Pulse 63   Temp 98.1 F (36.7 C) (Oral)   Ht 6' 1.5" (1.867 m)   Wt 157 lb 3.2 oz (71.3 kg)   SpO2 100%   BMI 20.46 kg/m    Wt Readings from Last 3 Encounters:  06/12/16 157 lb 3.2 oz (71.3 kg)  06/06/16 159 lb (72.1 kg)  06/06/16 159 lb (72.1 kg)

## 2016-06-13 ENCOUNTER — Ambulatory Visit
Admission: RE | Admit: 2016-06-13 | Discharge: 2016-06-13 | Disposition: A | Discharge status: Home / Self Care | Payer: Medicaid Other | Source: Ambulatory Visit | Attending: Radiation Oncology | Admitting: Radiation Oncology

## 2016-06-13 ENCOUNTER — Ambulatory Visit (HOSPITAL_BASED_OUTPATIENT_CLINIC_OR_DEPARTMENT_OTHER): Payer: No Typology Code available for payment source | Admitting: Hematology and Oncology

## 2016-06-13 ENCOUNTER — Encounter: Payer: Self-pay | Admitting: Hematology and Oncology

## 2016-06-13 ENCOUNTER — Ambulatory Visit (HOSPITAL_BASED_OUTPATIENT_CLINIC_OR_DEPARTMENT_OTHER): Payer: No Typology Code available for payment source

## 2016-06-13 VITALS — BP 128/86 | HR 54 | Temp 97.9°F | Resp 16

## 2016-06-13 DIAGNOSIS — G893 Neoplasm related pain (acute) (chronic): Secondary | ICD-10-CM

## 2016-06-13 DIAGNOSIS — R110 Nausea: Secondary | ICD-10-CM

## 2016-06-13 DIAGNOSIS — C090 Malignant neoplasm of tonsillar fossa: Secondary | ICD-10-CM

## 2016-06-13 DIAGNOSIS — H9313 Tinnitus, bilateral: Secondary | ICD-10-CM

## 2016-06-13 DIAGNOSIS — Z510 Encounter for antineoplastic radiation therapy: Secondary | ICD-10-CM | POA: Diagnosis not present

## 2016-06-13 DIAGNOSIS — T451X5A Adverse effect of antineoplastic and immunosuppressive drugs, initial encounter: Secondary | ICD-10-CM

## 2016-06-13 DIAGNOSIS — R634 Abnormal weight loss: Secondary | ICD-10-CM

## 2016-06-13 DIAGNOSIS — Z931 Gastrostomy status: Secondary | ICD-10-CM

## 2016-06-13 MED ORDER — SODIUM CHLORIDE 0.9 % IJ SOLN
10.0000 mL | INTRAMUSCULAR | Status: AC | PRN
  Administered 2016-06-13: 10 mL
  Filled 2016-06-13: qty 10

## 2016-06-13 MED ORDER — SODIUM CHLORIDE 0.9 % IV SOLN
Freq: Once | INTRAVENOUS | Status: AC
  Administered 2016-06-13: 09:00:00 via INTRAVENOUS

## 2016-06-13 MED ORDER — HEPARIN SOD (PORK) LOCK FLUSH 100 UNIT/ML IV SOLN
500.0000 [IU] | INTRAVENOUS | Status: DC | PRN
  Administered 2016-06-13: 500 [IU]
  Filled 2016-06-13: qty 5

## 2016-06-13 MED FILL — HANDCLENS SANITIZER 1.75 OZ: 0.13 | 10 days supply | Qty: 2 | Fill #0

## 2016-06-13 NOTE — Assessment & Plan Note (Signed)
This is stable. He is using regular anti-emetics to prevent nausea

## 2016-06-13 NOTE — Progress Notes (Signed)
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 Chronic small-vessel ischemic changes of the cerebral hemispheric white matter.       04/04/2016 Imaging    CT neck showed left tonsillar mass with diameter of 19 x 24 mm consistent with 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 necrosis. Suspicious level 5/supraclavicular nodes on the left. Scar type density in the right upper lobe is more prominent than was seen on a CT scan of the chest 10/06/2014      04/21/2016 PET scan    Asymmetric hypermetabolic soft tissue prominence in left palatine tonsil, consistent with known primary left tonsillar squamous cell Carcinoma. Left level 2 cervical lymphadenopathy, consistent with metastatic disease. No evidence of metastatic disease within the chest, abdomen, or pelvis. Stable 14 mm ground-glass and part solid nodular opacity in right lung apex compared to previous CT in 2016. This shows low-grade metabolic activity. Low-grade bronchogenic adenocarcinoma cannot be excluded. Consider surgical resection versus continued followup by CT in 12 months      05/17/2016 Procedure    Successful placement of a 20 French pull  through gastrostomy tube & right IJ approach [facility] [place].      05/22/2016 -  Chemotherapy    The patient had palonosetron (ALOXI) injection 0.25 mg, 0.25 mg, Intravenous,  Once, 1 of 3 cycles  CISplatin (PLATINOL) 192 mg in sodium chloride 0.9 % 500 mL chemo infusion, 100 mg/m2 = 192 mg, Intravenous,  Once, 1 of 3 cycles  fosaprepitant (EMEND) 150 mg, dexamethasone (DECADRON) 12 mg in sodium chloride 0.9 % 145 mL IVPB, , Intravenous,  Once, 1 of 3 cycles  for chemotherapy treatment.        05/23/2016 -  Radiation Therapy    He received concurrent radiation therapy       INTERVAL HISTORY: Please see below for problem oriented charting. He is seen as part of his weekly supportive care visit. He has received cycle 2 of treatment yesterday. Denies significant tinnitus He has lost some weight since I saw him. He was seen by a dietitian and has started using his feeding tube He has some nausea but no vomiting. He had recent constipation, resolved with laxative. He had mild mucositis pain but is not using pain medicine regularly.  REVIEW OF SYSTEMS:   Constitutional: Denies fevers, chills  Eyes: Denies blurriness of vision Respiratory: Denies cough, dyspnea or wheezes Cardiovascular: Denies palpitation, chest discomfort or lower extremity swelling Skin: Denies abnormal skin rashes Lymphatics: Denies new lymphadenopathy or easy bruising Neurological:Denies numbness, tingling or new weaknesses Behavioral/Psych: Mood is stable, no new changes  All other systems were reviewed with the patient and are negative.  I have reviewed the past medical history, past surgical history, social history  and family history with the patient and they are unchanged from previous note.  ALLERGIES:  has No Known Allergies.  MEDICATIONS:  Current Outpatient Prescriptions  Medication Sig Dispense Refill  . Ethyl Alcohol, Skin Cleanser, (PURELL INSTANT HAND SANITIZER) 65 % FOAM Use as needed to sanitize  hands 160 mL 5  . fentaNYL (DURAGESIC - DOSED MCG/HR) 25 MCG/HR patch Place 1 patch (25 mcg total) onto the skin every 3 (three) days. (Patient not taking: Reported on 06/12/2016) 5 patch 0  . lidocaine (XYLOCAINE) 2 % solution Patient: Mix 1part 2% viscous lidocaine, 1part H20. Swallow 41mL of this mixture, 49min before meals and at bedtime, up to QID PRN soreness 100 mL 5  . lidocaine-prilocaine (EMLA) cream Apply to affected area once 30 g 3  . LORazepam (ATIVAN) 0.5 MG tablet Take 1 tablet up to BID PRN nausea or anxiety.  Can be taken 30 min before radiotherapy for anxiety. 50 tablet 0  . Morphine Sulfate (MORPHINE CONCENTRATE) 10 mg / 0.5 ml concentrated solution Take 0.5 mLs (10 mg total) by mouth every 2 (two) hours as needed for severe pain. (Patient not taking: Reported on 06/12/2016) 240 mL 0  . ondansetron (ZOFRAN) 8 MG tablet Take 1 tablet (8 mg total) by mouth every 8 (eight) hours as needed. Start on the third day after chemotherapy. 60 tablet 1  . prochlorperazine (COMPAZINE) 10 MG tablet Take 1 tablet (10 mg total) by mouth every 6 (six) hours as needed (Nausea or vomiting). (Patient not taking: Reported on 06/12/2016) 60 tablet 1  . sodium fluoride (FLUORISHIELD) 1.1 % GEL dental gel Instill one drop of gel per tooth space of fluoride tray. Place over teeth for 5 minutes. Remove. Spit out excess. Repeat nightly. 120 mL prn  . sucralfate (CARAFATE) 1 g tablet Dissolve 1 tablet in 10 mL H20 and swallow up to QID PRN sore throat 40 tablet 5   No current facility-administered medications for this visit.    Facility-Administered Medications Ordered in Other Visits  Medication Dose Route Frequency Provider Last Rate Last Dose  . 0.9 %  sodium chloride infusion   Intravenous Once Heath Lark, MD      . heparin lock flush 100 unit/mL  500 Units Intracatheter PRN Heath Lark, MD      . sodium chloride 0.9 % injection 10 mL  10 mL Intracatheter PRN Heath Lark, MD      . sodium chloride flush  (NS) 0.9 % injection 10 mL  10 mL Intracatheter PRN Heath Lark, MD   10 mL at 05/24/16 1615    PHYSICAL EXAMINATION: ECOG PERFORMANCE STATUS: 1 - Symptomatic but completely ambulatory  Vitals:   There were no vitals filed for this visit.  GENERAL:alert, no distress and comfortable SKIN: skin color, texture, turgor are normal, no rashes or significant lesions EYES: normal, Conjunctiva are pink and non-injected, sclera clear OROPHARYNX:no exudate, no erythema and lips, buccal mucosa, and tongue normal  NECK: supple, thyroid normal size, non-tender, without nodularity LYMPH:  The size of lymphadenopathy has regressed  LUNGS: clear to auscultation and percussion with normal breathing effort HEART: regular rate & rhythm and no murmurs and no lower extremity edema ABDOMEN:abdomen soft, non-tender and normal bowel sounds. Feeding tube site looks okay  Musculoskeletal:no cyanosis of digits and no clubbing  NEURO: alert & oriented x 3 with fluent speech, no focal motor/sensory deficits  LABORATORY DATA:  I have reviewed the data as listed    Component Value Date/Time   NA  141 06/09/2016 0831   K 4.8 06/09/2016 0831   CL 105 05/17/2016 1250   CO2 28 06/09/2016 0831   GLUCOSE 77 06/09/2016 0831   BUN 16.6 06/09/2016 0831   CREATININE 0.9 06/09/2016 0831   CALCIUM 9.9 06/09/2016 0831   PROT 8.0 06/09/2016 0831   ALBUMIN 3.9 06/09/2016 0831   AST 27 06/09/2016 0831   ALT 17 06/09/2016 0831   ALKPHOS 78 06/09/2016 0831   BILITOT 0.56 06/09/2016 0831   GFRNONAA >60 05/17/2016 1250   GFRAA >60 05/17/2016 1250    No results found for: SPEP, UPEP  Lab Results  Component Value Date   WBC 4.8 06/09/2016   NEUTROABS 3.3 06/09/2016   HGB 13.3 06/09/2016   HCT 40.1 06/09/2016   MCV 92.9 06/09/2016   PLT 193 06/09/2016      Chemistry      Component Value Date/Time   NA 141 06/09/2016 0831   K 4.8 06/09/2016 0831   CL 105 05/17/2016 1250   CO2 28 06/09/2016 0831   BUN 16.6  06/09/2016 0831   CREATININE 0.9 06/09/2016 0831      Component Value Date/Time   CALCIUM 9.9 06/09/2016 0831   ALKPHOS 78 06/09/2016 0831   AST 27 06/09/2016 0831   ALT 17 06/09/2016 0831   BILITOT 0.56 06/09/2016 0831      ASSESSMENT & PLAN:  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HCC) He tolerated treatment well with mild expected side effects. He will continue to be seen on a weekly basis for supportive care  Weight loss He has progressive recent weight loss related to nausea. I recommend him to start using the feeding tube and increase dietary supplement in between meals He will continue regular visits with dietitian  S/P gastrostomy ([place]) The feeding tube site looks clean without signs of infection.  Chemotherapy-induced nausea This is stable. He is using regular anti-emetics to prevent nausea  Bilateral tinnitus Tinnitus is not significant with recent treatment. Continue close observation.  Cancer associated pain He has some mild mucositis, expected side effects of treatment. I recommend he takes liquid morphine sulfate as needed for pain   No orders of the defined types were placed in this encounter.  All questions were answered. The patient knows to call the clinic with any problems, questions or concerns. No barriers to learning was detected. I spent 20 minutes counseling the patient face to face. The total time spent in the appointment was 25 minutes and more than 50% was on counseling and review of test results     Heath Lark, MD 06/13/2016 10:33 AM

## 2016-06-13 NOTE — Patient Instructions (Signed)

## 2016-06-13 NOTE — Assessment & Plan Note (Signed)
He has progressive recent weight loss related to nausea. I recommend him to start using the feeding tube and increase dietary supplement in between meals He will continue regular visits with dietitian

## 2016-06-13 NOTE — Progress Notes (Signed)
Oncology Nurse Navigator Documentation  Met with Mr. Squire in Infusion to check on his well being.  He was receiving next cycle of Cisplatin. He stated:  "Doing OK".  Throat soreness increasing but discomfort controlled with lidocaine rinse.  Applying Sonafine BID.  Conducting swallowing exercises.  Still eating/drinking orally but sense of taste challenged. He denied needs/concerns at this time, understands to contact me if needed.  Gayleen Orem, RN, BSN, [place] at [place] [phone]

## 2016-06-13 NOTE — Assessment & Plan Note (Signed)
Tinnitus is not significant with recent treatment. Continue close observation.

## 2016-06-13 NOTE — Assessment & Plan Note (Signed)
He has some mild mucositis, expected side effects of treatment. I recommend he takes liquid morphine sulfate as needed for pain

## 2016-06-13 NOTE — Assessment & Plan Note (Signed)
He tolerated treatment well with mild expected side effects. He will continue to be seen on a weekly basis for supportive care

## 2016-06-13 NOTE — Assessment & Plan Note (Signed)
The feeding tube site looks clean without signs of infection.

## 2016-06-14 ENCOUNTER — Ambulatory Visit (HOSPITAL_BASED_OUTPATIENT_CLINIC_OR_DEPARTMENT_OTHER): Payer: No Typology Code available for payment source

## 2016-06-14 ENCOUNTER — Ambulatory Visit
Admission: RE | Admit: 2016-06-14 | Discharge: 2016-06-14 | Disposition: A | Discharge status: Home / Self Care | Payer: Medicaid Other | Source: Ambulatory Visit | Attending: Radiation Oncology | Admitting: Radiation Oncology

## 2016-06-14 VITALS — BP 113/76 | HR 52 | Temp 98.1°F | Resp 17

## 2016-06-14 DIAGNOSIS — Z510 Encounter for antineoplastic radiation therapy: Secondary | ICD-10-CM | POA: Diagnosis not present

## 2016-06-14 DIAGNOSIS — C090 Malignant neoplasm of tonsillar fossa: Secondary | ICD-10-CM

## 2016-06-14 DIAGNOSIS — R110 Nausea: Secondary | ICD-10-CM

## 2016-06-14 MED ORDER — SODIUM CHLORIDE 0.9% FLUSH
10.0000 mL | INTRAVENOUS | Status: DC | PRN
  Administered 2016-06-14: 10 mL
  Filled 2016-06-14: qty 10

## 2016-06-14 MED ORDER — SODIUM CHLORIDE 0.9 % IV SOLN
10.0000 mg | Freq: Once | INTRAVENOUS | Status: AC
  Administered 2016-06-14: 10 mg via INTRAVENOUS
  Filled 2016-06-14: qty 1

## 2016-06-14 MED ORDER — SODIUM CHLORIDE 0.9 % IV SOLN
INTRAVENOUS | Status: AC
  Administered 2016-06-14: 09:00:00 via INTRAVENOUS

## 2016-06-14 MED ORDER — HEPARIN SOD (PORK) LOCK FLUSH 100 UNIT/ML IV SOLN
500.0000 [IU] | Freq: Once | INTRAVENOUS | Status: AC | PRN
  Administered 2016-06-14: 500 [IU]
  Filled 2016-06-14: qty 5

## 2016-06-14 NOTE — Patient Instructions (Signed)

## 2016-06-15 ENCOUNTER — Ambulatory Visit
Admission: RE | Admit: 2016-06-15 | Discharge: 2016-06-15 | Disposition: A | Discharge status: Home / Self Care | Payer: Medicaid Other | Source: Ambulatory Visit | Attending: Radiation Oncology | Admitting: Radiation Oncology

## 2016-06-15 DIAGNOSIS — Z510 Encounter for antineoplastic radiation therapy: Secondary | ICD-10-CM | POA: Diagnosis not present

## 2016-06-16 ENCOUNTER — Ambulatory Visit
Admission: RE | Admit: 2016-06-16 | Discharge: 2016-06-16 | Disposition: A | Discharge status: Home / Self Care | Payer: Medicaid Other | Source: Ambulatory Visit | Attending: Radiation Oncology | Admitting: Radiation Oncology

## 2016-06-16 DIAGNOSIS — Z510 Encounter for antineoplastic radiation therapy: Secondary | ICD-10-CM | POA: Diagnosis not present

## 2016-06-19 ENCOUNTER — Encounter: Payer: Self-pay | Admitting: *Deleted

## 2016-06-19 ENCOUNTER — Ambulatory Visit
Admission: RE | Admit: 2016-06-19 | Discharge: 2016-06-19 | Disposition: A | Discharge status: Home / Self Care | Payer: Medicaid Other | Source: Ambulatory Visit | Attending: Radiation Oncology | Admitting: Radiation Oncology

## 2016-06-19 ENCOUNTER — Telehealth: Payer: Self-pay | Admitting: *Deleted

## 2016-06-19 ENCOUNTER — Encounter: Payer: Self-pay | Admitting: Radiation Oncology

## 2016-06-19 ENCOUNTER — Ambulatory Visit: Payer: Medicaid Other | Attending: Radiation Oncology

## 2016-06-19 ENCOUNTER — Ambulatory Visit (HOSPITAL_BASED_OUTPATIENT_CLINIC_OR_DEPARTMENT_OTHER): Payer: No Typology Code available for payment source | Admitting: Nurse Practitioner

## 2016-06-19 ENCOUNTER — Ambulatory Visit: Payer: Self-pay

## 2016-06-19 VITALS — BP 110/75 | HR 75 | Temp 98.4°F | Resp 18 | Ht 73.5 in | Wt 152.6 lb

## 2016-06-19 VITALS — BP 113/89 | HR 79 | Temp 98.0°F | Resp 18 | Ht 73.5 in | Wt 154.4 lb

## 2016-06-19 DIAGNOSIS — R1310 Dysphagia, unspecified: Secondary | ICD-10-CM

## 2016-06-19 DIAGNOSIS — C090 Malignant neoplasm of tonsillar fossa: Secondary | ICD-10-CM

## 2016-06-19 DIAGNOSIS — E860 Dehydration: Secondary | ICD-10-CM

## 2016-06-19 DIAGNOSIS — Z510 Encounter for antineoplastic radiation therapy: Secondary | ICD-10-CM | POA: Diagnosis not present

## 2016-06-19 MED ORDER — SODIUM CHLORIDE 0.9 % IJ SOLN
10.0000 mL | INTRAMUSCULAR | Status: DC | PRN
  Administered 2016-06-19: 10 mL
  Filled 2016-06-19: qty 10

## 2016-06-19 MED ORDER — SODIUM CHLORIDE 0.9 % IV SOLN
Freq: Once | INTRAVENOUS | Status: AC
  Administered 2016-06-19: 11:00:00 via INTRAVENOUS

## 2016-06-19 MED ORDER — LORAZEPAM 0.5 MG PO TABS
0.5000 mg | ORAL_TABLET | ORAL | Status: AC
  Administered 2016-06-19: 0.5 mg via ORAL
  Filled 2016-06-19: qty 1

## 2016-06-19 MED ORDER — HEPARIN SOD (PORK) LOCK FLUSH 100 UNIT/ML IV SOLN
500.0000 [IU] | Freq: Once | INTRAVENOUS | Status: AC | PRN
  Administered 2016-06-19: 500 [IU]
  Filled 2016-06-19: qty 5

## 2016-06-19 MED ORDER — SODIUM CHLORIDE 0.9 % IV SOLN
Freq: Once | INTRAVENOUS | Status: AC
  Administered 2016-06-19: 12:00:00 via INTRAVENOUS
  Filled 2016-06-19: qty 4

## 2016-06-19 NOTE — Therapy (Signed)
[facility] [address] [place], [place], [address] Phone: [phone]   Fax:  [phone]  Speech Language Pathology Treatment  Patient Details  Name: [name] [name] MRN: [id number] Date of Birth: [birth year] Referring Provider: Eppie Gibson, M.D.  Encounter Date: 06/19/2016      End of Session - 06/19/16 1640    Visit Number 2   Number of Visits 6   Date for SLP Re-Evaluation 11/14/16   SLP Start Time 1555   SLP Stop Time  1625   SLP Time Calculation (min) 30 min      Past Medical History:  Diagnosis Date  . Anxiety   . Cancer ([place]) 04/04/2016   cancer of left tonsil  . COPD (chronic obstructive pulmonary disease) ([place])   . Hepatitis   . Hepatitis C    Treatment x 8 weeks 6-7 months ago [facility]  . Sinus congestion   . Stomach ulcer     Past Surgical History:  Procedure Laterality Date  . COLONOSCOPY WITH PROPOFOL N/A 04/14/2015   Procedure: COLONOSCOPY WITH PROPOFOL;  Surgeon: Arta Silence, MD;  Location: [facility];  Service: Endoscopy;  Laterality: N/A;  . HERNIA REPAIR    . IR GENERIC HISTORICAL  05/17/2016   IR FLUORO GUIDE PORT INSERTION RIGHT 05/17/2016 Jacqulynn Cadet, MD [facility]-INTERV RAD  . IR GENERIC HISTORICAL  05/17/2016   IR US GUIDE VASC ACCESS RIGHT 05/17/2016 Jacqulynn Cadet, MD [facility]  . IR GENERIC HISTORICAL  05/17/2016   IR GASTROSTOMY TUBE MOD SED 05/17/2016 Jacqulynn Cadet, MD [facility]INTER[facility]  . MULTIPLE EXTRACTIONS WITH ALVEOLOPLASTY N/A 05/03/2016   Procedure: Multiple extractions with alveoloplasty and gross debridement of remaining teeth.;  Surgeon: Lenn Cal, DDS;  Location: [facility] ORS;  Service: Oral Surgery;  Laterality: N/A;  . STOMACH SURGERY     20 years    There were no vitals filed for this visit.      Subjective Assessment - 06/19/16 1603    Subjective "(Dr. Isidore Moos) just said it a few minutes age - we wanna keep the muscles in the throat) working."                ADULT SLP TREATMENT - 06/19/16 1606      General Information   Behavior/Cognition Alert;Cooperative;Pleasant mood     Treatment Provided   Treatment provided Dysphagia     Dysphagia Treatment   Temperature Spikes Noted No   Patient observed directly with PO's Yes   Type of PO's observed Thin liquids   Oral Phase Signs & Symptoms --  none noted   Pharyngeal Phase Signs & Symptoms --  none noted   Other treatment/comments Pt reports food is making him forcefully swallow due to a strong desire to regurgitate, however, water passes unhindered.  Pt is taking 7 cans tube feeding at this time. He told SLP he was completing the HEP but didn't do it this past weekend. Pt told SLP he remembered [place] exercise. However, pt req'd mod A usually for HEP and read each exercise slowly to himself prior to attempting. Pt asked SLP for demonstration for one exercise. By session's end, pt had completed all necessary exercises appropirately. Pt told SLP why SLP prescribed exercises independently. SLP reminded pt of 3-6 month post-XRT as greatest chance of muscle fibrosis. SLP told pt if he should find swallowing difficult before the next ST visit to complete exercises that do not require swallowing until he is able ot complete full HEP again.  Pain Assessment   Pain Assessment 0-10   Pain Score 3    Pain Location throat/mouth   Pain Descriptors / Indicators Sore;Tender   Pain Intervention(s) Monitored during session;Limited activity within patient's tolerance     Dysphagia Recommendations   Diet recommendations Thin liquid  solids as tolerated     Progression Toward Goals   Progression toward goals Progressing toward goals          SLP Education - 06/19/16 1639    Education provided Yes   Education Details HEP, late effects head/neck radiation on swallowing, modifications for HEP over tx course   Person(s) Educated Patient   Methods Explanation;Demonstration;Verbal  cues;Handout   Comprehension Verbalized understanding;Returned demonstration;Verbal cues required          SLP Short Term Goals - 06/19/16 1642      SLP SHORT TERM GOAL #1   Title pt will complete HEP for dysphagia with rare min A   Baseline total A   Time 2   Period --  therapy visits (3 total sessions, all STGs)   Status On-going     SLP SHORT TERM GOAL #2   Title pt will tell SLP why he is completing HEP   Baseline total A   Status Achieved     SLP SHORT TERM GOAL #3   Title pt will tell s/s to indicate possible difficulty with aspiration   Baseline not provided yet   Time 2   Period --  visits   Status On-going          SLP Long Term Goals - 06/19/16 1642      SLP LONG TERM GOAL #1   Title pt will complete HEP with independence over two sessions   Baseline total A   Time 4   Period --  therapy visits (5 total sessions)   Status On-going     SLP LONG TERM GOAL #2   Title pt will tell SLP why a food journal can A pt in returning to regular diet after radiation tx   Baseline not provided yet   Time 4   Period --  therapy visits   Status On-going          Plan - 06/19/16 1641    Clinical Impression Statement Pt presents with swallowing of water as WNL, pt politely refused solid POs. The incidence of dysphagia remains high with pt receiving head/neck radiation however, so pt would benefit from skilled ST to assess safety with POs, and proper procedure with HEP provided today.   Speech Therapy Frequency --  approx once every 4 weeks   Duration --  5 more visits   Treatment/Interventions Aspiration precaution training;Pharyngeal strengthening exercises;Diet toleration management by SLP;Compensatory techniques;Internal/external aids;SLP instruction and feedback;Patient/family education;Trials of upgraded texture/liquids   Potential to Achieve Goals Good   Consulted and Agree with Plan of Care Patient      Patient will benefit from skilled therapeutic  intervention in order to improve the following deficits and impairments:   Dysphagia, unspecified type    Problem List Patient Active Problem List   Diagnosis Date Noted  . Bilateral tinnitus 05/30/2016  . Chemotherapy-induced nausea 05/30/2016  . Constipation, acute 05/30/2016  . Weight loss 05/30/2016  . Hypersensitivity reaction 05/23/2016  . S/P gastrostomy ([place]) 05/18/2016  . Cancer associated pain 05/18/2016  . Lesion of right lung 04/26/2016  . 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 History of hepatitis C 04/24/2016  . Chest pain 09/17/2014  .  Anxiety   . Dyspnea 03/25/2014  . COPD GOLD II 07/25/2013  . Sinusitis, chronic 07/25/2013    [facility] ,[place], CCC-SLP  06/19/2016, 4:43 PM  [facility] [address] [place], [place], [address] Phone: [phone]   Fax:  [phone]   Name: [name] [name] MRN: [id number] Date of Birth: 1956-07-17

## 2016-06-19 NOTE — Progress Notes (Signed)
   Weekly Management Note:  Outpatient    ICD-9-CM ICD-10-CM   1. 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HCC) 146.1 C09.0 LORazepam (ATIVAN) tablet 0.5 mg    Current Dose:  40Gy  Projected Dose: 70 Gy   Narrative:  The patient presents for routine under treatment assessment.  CBCT/MVCT images/Port film x-rays were reviewed.  The chart was checked. He reports pain in his throat at a 3/10.  He is not taking anything except lidocaine and occasional carafate.  He reports having a dry mouth and thick saliva.  He is using the baking soda/salt rinse.  He reports his taste buds are gone.  The roof of his mouth and back of his mouth is red with white coating roof of mouth.  He reports his mouth is sore.  He is eating softer foods less.  Using PEG 7 cans a day Osmolite per day, flushed with 60cc of water before and after feeding.  Peg tube site without signs of infection per nursing. Reported having nausea Friday afternoon and during the weekend.  Received IV fluids earlier today.  The skin on his neck is intact.  He is using sonafine.  He reports having slight fatigue. Orthostatic vitals signs taken: bp sitting 131/98 74 standing 113/89 79.  Physical Findings:  Wt Readings from Last 3 Encounters:  06/19/16 154 lb 6.4 oz (70 kg)  06/19/16 152 lb 9.6 oz (69.2 kg)  06/12/16 157 lb 3.2 oz (71.3 kg)    height is 6' 1.5" (1.867 m) and weight is 154 lb 6.4 oz (70 kg). His oral temperature is 98 F (36.7 C). His blood pressure is 113/89 and his pulse is 79. His respiration is 18 and oxygen saturation is 100%.  NAD,no tumor appreciated in left tonsil.  Confluent mucositis over oropharynx.  skin intact over neck  CBC    Component Value Date/Time   WBC 4.8 06/09/2016 0831   WBC 9.1 05/17/2016 1250   RBC 4.32 06/09/2016 0831   RBC 4.78 05/17/2016 1250   HGB 13.3 06/09/2016 0831   HCT 40.1 06/09/2016 0831   PLT 193 06/09/2016 0831   MCV 92.9 06/09/2016 0831   MCH 30.9 06/09/2016 0831   MCH 31.8  05/17/2016 1250   MCHC 33.3 06/09/2016 0831   MCHC 35.1 05/17/2016 1250   RDW 13.3 06/09/2016 0831   LYMPHSABS 0.8 (L) 06/09/2016 0831   MONOABS 0.7 06/09/2016 0831   EOSABS 0.1 06/09/2016 0831   BASOSABS 0.0 06/09/2016 0831     CMP     Component Value Date/Time   NA 141 06/09/2016 0831   K 4.8 06/09/2016 0831   CL 105 05/17/2016 1250   CO2 28 06/09/2016 0831   GLUCOSE 77 06/09/2016 0831   BUN 16.6 06/09/2016 0831   CREATININE 0.9 06/09/2016 0831   CALCIUM 9.9 06/09/2016 0831   PROT 8.0 06/09/2016 0831   ALBUMIN 3.9 06/09/2016 0831   AST 27 06/09/2016 0831   ALT 17 06/09/2016 0831   ALKPHOS 78 06/09/2016 0831   BILITOT 0.56 06/09/2016 0831   GFRNONAA >60 05/17/2016 1250   GFRAA >60 05/17/2016 1250     Impression:  The patient is tolerating radiotherapy.   Plan:  Continue radiotherapy as planned. Encouragement given.  Doing well overall. Discussed how to take compazine around the clock for nausea. -----------------------------------  Eppie Gibson, MD

## 2016-06-19 NOTE — Telephone Encounter (Signed)
Pt in lobby this morning c/o nausea.  States taking nausea medication as directed but still feels nauseated.  Added on for IVFs and IV Zofran per Dr. Alvy Bimler.  Pt to see Dr. Alvy Bimler as scheduled tomorrow am.  Reviewed w/ pt to take Zofran on schedule every 8 hrs and add Compazine q 6 hrs prn for any nausea.  Alternate both medications.  Bring nausea meds w/ him for office visit tomorrow so RN can review them with him.  He verbalized understanding.

## 2016-06-19 NOTE — Patient Instructions (Signed)
Complete exercises as prescribed. On days when you cannot complete all exercises due to swallowing pain, complete exercises that do not require swallowing.

## 2016-06-19 NOTE — Progress Notes (Signed)
Oncology Nurse Navigator Documentation  Notified by [facility] RN that Mr. Stash is in need of Osmolite 1.5, obtained case from [place], [place], delivered case to Mr. Scheid while he received IVF in [facility].  Gayleen Orem, RN, BSN, [place] at [place] [phone]

## 2016-06-19 NOTE — Patient Instructions (Signed)

## 2016-06-19 NOTE — Progress Notes (Addendum)
[name] [name] has completed 20 fractions to his left tonsil/bilateral neck.  He reports pain in his throat at a 3/10.  He is not taking anything except lidocaine and occasional carafate.  He reports having a dry mouth and thick saliva.  He is using the baking soda/salt rinse.  He reports his taste buds are gone.  The roof of his mouth and back of his mouth is red with white coating roof of mouth.  He reports his mouth is sore.  He is eating softer foods less.  Using PEG 7 cans a day Osmolite per day, flushed with 60cc of water before and after feeding.  Peg tube site without signs of infection. Reported having nausea Friday afternoon and during the weekend.  Received IV fluids earlier today.  The skin on his neck is intact.  He is using sonafine.  He reports having slight fatigue.  He will have chemotherapy today.Orthostatic vitals signs taken: bp sitting 131/98 74 standing 113/89 79. Wt Readings from Last 3 Encounters:  06/19/16 154 lb 6.4 oz (70 kg)  06/19/16 152 lb 9.6 oz (69.2 kg)  06/12/16 157 lb 3.2 oz (71.3 kg)  131/98  98  74  18 100%

## 2016-06-20 ENCOUNTER — Encounter: Payer: Self-pay | Admitting: Hematology and Oncology

## 2016-06-20 ENCOUNTER — Ambulatory Visit
Admission: RE | Admit: 2016-06-20 | Discharge: 2016-06-20 | Disposition: A | Discharge status: Home / Self Care | Payer: Medicaid Other | Source: Ambulatory Visit | Attending: Radiation Oncology | Admitting: Radiation Oncology

## 2016-06-20 ENCOUNTER — Telehealth: Payer: Self-pay | Admitting: Hematology and Oncology

## 2016-06-20 ENCOUNTER — Ambulatory Visit (HOSPITAL_BASED_OUTPATIENT_CLINIC_OR_DEPARTMENT_OTHER): Payer: No Typology Code available for payment source | Admitting: Hematology and Oncology

## 2016-06-20 DIAGNOSIS — C090 Malignant neoplasm of tonsillar fossa: Secondary | ICD-10-CM

## 2016-06-20 DIAGNOSIS — G893 Neoplasm related pain (acute) (chronic): Secondary | ICD-10-CM

## 2016-06-20 DIAGNOSIS — Z510 Encounter for antineoplastic radiation therapy: Secondary | ICD-10-CM | POA: Diagnosis not present

## 2016-06-20 DIAGNOSIS — K5900 Constipation, unspecified: Secondary | ICD-10-CM

## 2016-06-20 DIAGNOSIS — R110 Nausea: Secondary | ICD-10-CM

## 2016-06-20 DIAGNOSIS — R634 Abnormal weight loss: Secondary | ICD-10-CM

## 2016-06-20 DIAGNOSIS — T451X5A Adverse effect of antineoplastic and immunosuppressive drugs, initial encounter: Secondary | ICD-10-CM

## 2016-06-20 DIAGNOSIS — Z931 Gastrostomy status: Secondary | ICD-10-CM

## 2016-06-20 NOTE — Assessment & Plan Note (Signed)
He has some mild mucositis, expected side effects of treatment. I recommend he takes liquid morphine sulfate as needed for pain

## 2016-06-20 NOTE — Telephone Encounter (Signed)
GAVE PATIENT AVS REPORT AND APPOINTMENTS FOR October AND November.   SPOKE WITH CHANDRA AT [facility] RE APPOINTMENTS FOR 10/20, 10/23 AND 10/26 ALL AT 9 AM. APPOINTMENTS NOT YET VISIBLE ON PATIENT APPOINTMENT DESK, HOWEVER PATIENT IS AWARE THAT HE IS TO REPORT TO [facility] ON THE ABOVE DATES/TIMES WHICH HAVE ALSO BEEN INDICATED ON HIS CALENDAR.

## 2016-06-20 NOTE — Assessment & Plan Note (Signed)
He tolerated treatment well with mild expected side effects. He will continue to be seen on a weekly basis for supportive care

## 2016-06-20 NOTE — Assessment & Plan Note (Signed)
He has progressive recent weight loss related to nausea. I recommend him to start using the feeding tube and increase dietary supplement in between meals He will continue regular visits with dietitian

## 2016-06-20 NOTE — Assessment & Plan Note (Signed)
The patient felt much better with IV fluid resuscitation and IV anti-emetics We discussed potential use of daily IV fluids for additional hydration support and he agreed to proceed. Due to conflict with scheduling, he would like to start this coming Thursday.

## 2016-06-20 NOTE — Progress Notes (Signed)
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 Chronic small-vessel ischemic changes of the cerebral hemispheric white matter.       04/04/2016 Imaging    CT neck showed left tonsillar mass with diameter of 19 x 24 mm consistent with 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 necrosis. Suspicious level 5/supraclavicular nodes on the left. Scar type density in the right upper lobe is more prominent than was seen on a CT scan of the chest 10/06/2014      04/21/2016 PET scan    Asymmetric hypermetabolic soft tissue prominence in left palatine tonsil, consistent with known primary left tonsillar squamous cell Carcinoma. Left level 2 cervical lymphadenopathy, consistent with metastatic disease. No evidence of metastatic disease within the chest, abdomen, or pelvis. Stable 14 mm ground-glass and part solid nodular opacity in right lung apex compared to previous CT in 2016. This shows low-grade metabolic activity. Low-grade bronchogenic adenocarcinoma cannot be excluded. Consider surgical resection versus continued followup by CT in 12 months      05/17/2016 Procedure    Successful placement of a 20 French pull  through gastrostomy tube & right IJ approach [facility] [place].      05/22/2016 -  Chemotherapy    The patient had palonosetron (ALOXI) injection 0.25 mg, 0.25 mg, Intravenous,  Once, 1 of 3 cycles  CISplatin (PLATINOL) 192 mg in sodium chloride 0.9 % 500 mL chemo infusion, 100 mg/m2 = 192 mg, Intravenous,  Once, 1 of 3 cycles  fosaprepitant (EMEND) 150 mg, dexamethasone (DECADRON) 12 mg in sodium chloride 0.9 % 145 mL IVPB, , Intravenous,  Once, 1 of 3 cycles  for chemotherapy treatment.        05/23/2016 -  Radiation Therapy    He received concurrent radiation therapy       INTERVAL HISTORY: Please see below for problem oriented charting. He returns for follow-up. He felt better today but had recent nausea and dehydration. His symptoms improved with IV fluids. He has started to use his feeding tube due to dysgeusia and recent weight loss. He is able to tolerate 6-7 cans of nutritional supplement per day. He denies dysphagia. He had recent constipation Mucositis pain is under good control with current prescription pain medicine He denies tinnitus or hearing loss. No peripheral neuropathy  REVIEW OF SYSTEMS:   Constitutional: Denies fevers, chills  Eyes: Denies blurriness of vision Ears, nose, mouth, throat, and face: Denies mucositis or sore throat Respiratory: Denies cough, dyspnea or wheezes Cardiovascular: Denies palpitation, chest discomfort or lower extremity swelling Skin: Denies abnormal skin rashes Lymphatics: Denies new lymphadenopathy or easy bruising Neurological:Denies numbness, tingling or new weaknesses Behavioral/Psych: Mood is stable, no new changes  All other systems were reviewed with the patient and  are negative.  I have reviewed the past medical history, past surgical history, social history and family history with the patient and they are unchanged from previous note.  ALLERGIES:  has No Known Allergies.  MEDICATIONS:  Current Outpatient Prescriptions   Medication Sig Dispense Refill  . Ethyl Alcohol, Skin Cleanser, (PURELL INSTANT HAND SANITIZER) 65 % FOAM Use as needed to sanitize hands 160 mL 5  . lidocaine (XYLOCAINE) 2 % solution Patient: Mix 1part 2% viscous lidocaine, 1part H20. Swallow 72mL of this mixture, 35min before meals and at bedtime, up to QID PRN soreness 100 mL 5  . lidocaine-prilocaine (EMLA) cream Apply to affected area once 30 g 3  . LORazepam (ATIVAN) 0.5 MG tablet Take 1 tablet up to BID PRN nausea or anxiety.  Can be taken 30 min before radiotherapy for anxiety. 50 tablet 0  . ondansetron (ZOFRAN) 8 MG tablet Take 1 tablet (8 mg total) by mouth every 8 (eight) hours as needed. Start on the third day after chemotherapy. 60 tablet 1  . prochlorperazine (COMPAZINE) 10 MG tablet Take 1 tablet (10 mg total) by mouth every 6 (six) hours as needed (Nausea or vomiting). 60 tablet 1  . sodium fluoride (FLUORISHIELD) 1.1 % GEL dental gel Instill one drop of gel per tooth space of fluoride tray. Place over teeth for 5 minutes. Remove. Spit out excess. Repeat nightly. 120 mL prn  . sucralfate (CARAFATE) 1 g tablet Dissolve 1 tablet in 10 mL H20 and swallow up to QID PRN sore throat 40 tablet 5  . fentaNYL (DURAGESIC - DOSED MCG/HR) 25 MCG/HR patch Place 1 patch (25 mcg total) onto the skin every 3 (three) days. (Patient not taking: Reported on 06/20/2016) 5 patch 0  . Morphine Sulfate (MORPHINE CONCENTRATE) 10 mg / 0.5 ml concentrated solution Take 0.5 mLs (10 mg total) by mouth every 2 (two) hours as needed for severe pain. (Patient not taking: Reported on 06/20/2016) 240 mL 0   No current facility-administered medications for this visit.    Facility-Administered Medications Ordered in Other Visits  Medication Dose Route Frequency Provider Last Rate Last Dose  . sodium chloride flush (NS) 0.9 % injection 10 mL  10 mL Intracatheter PRN Heath Lark, MD   10 mL at 05/24/16 1615    PHYSICAL EXAMINATION: ECOG PERFORMANCE STATUS: 1 -  Symptomatic but completely ambulatory  Vitals:   06/20/16 0846  BP: 107/74  Pulse: 64  Resp: 17  Temp: 97.9 F (36.6 C)   Filed Weights   06/20/16 0846  Weight: 152 lb 3.2 oz (69 kg)    GENERAL:alert, no distress and comfortable. He looks thin SKIN: skin color, texture, turgor are normal, no rashes or significant lesions EYES: normal, Conjunctiva are pink and non-injected, sclera clear OROPHARYNX: Noted oropharyngeal mucositis. No thrush NECK: supple, thyroid normal size, non-tender, without nodularity LYMPH:  no palpable lymphadenopathy in the cervical, axillary or inguinal LUNGS: clear to auscultation and percussion with normal breathing effort HEART: regular rate & rhythm and no murmurs and no lower extremity edema ABDOMEN:abdomen soft, non-tender and normal bowel sounds. Feeding tube site looks okay Musculoskeletal:no cyanosis of digits and no clubbing  NEURO: alert & oriented x 3 with fluent speech, no focal motor/sensory deficits  LABORATORY DATA:  I have reviewed the data as listed    Component Value Date/Time   NA 141 06/09/2016 0831   K 4.8 06/09/2016 0831   CL 105 05/17/2016 1250   CO2 28 06/09/2016 0831   GLUCOSE 77  06/09/2016 0831   BUN 16.6 06/09/2016 0831   CREATININE 0.9 06/09/2016 0831   CALCIUM 9.9 06/09/2016 0831   PROT 8.0 06/09/2016 0831   ALBUMIN 3.9 06/09/2016 0831   AST 27 06/09/2016 0831   ALT 17 06/09/2016 0831   ALKPHOS 78 06/09/2016 0831   BILITOT 0.56 06/09/2016 0831   GFRNONAA >60 05/17/2016 1250   GFRAA >60 05/17/2016 1250    No results found for: SPEP, UPEP  Lab Results  Component Value Date   WBC 4.8 06/09/2016   NEUTROABS 3.3 06/09/2016   HGB 13.3 06/09/2016   HCT 40.1 06/09/2016   MCV 92.9 06/09/2016   PLT 193 06/09/2016      Chemistry      Component Value Date/Time   NA 141 06/09/2016 0831   K 4.8 06/09/2016 0831   CL 105 05/17/2016 1250   CO2 28 06/09/2016 0831   BUN 16.6 06/09/2016 0831   CREATININE 0.9  06/09/2016 0831      Component Value Date/Time   CALCIUM 9.9 06/09/2016 0831   ALKPHOS 78 06/09/2016 0831   AST 27 06/09/2016 0831   ALT 17 06/09/2016 0831   BILITOT 0.56 06/09/2016 0831     ASSESSMENT & PLAN:  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HCC) He tolerated treatment well with mild expected side effects. He will continue to be seen on a weekly basis for supportive care  Chemotherapy-induced nausea The patient felt much better with IV fluid resuscitation and IV anti-emetics We discussed potential use of daily IV fluids for additional hydration support and he agreed to proceed. Due to conflict with scheduling, he would like to start this coming Thursday.  Weight loss He has progressive recent weight loss related to nausea. I recommend him to start using the feeding tube and increase dietary supplement in between meals He will continue regular visits with dietitian  S/P gastrostomy ([place]) The feeding tube site looks clean without signs of infection.  Constipation, acute He has new onset of acute constipation recently. This can contribute to reduced appetite. I recommend regular use of laxatives  Cancer associated pain He has some mild mucositis, expected side effects of treatment. I recommend he takes liquid morphine sulfate as needed for pain   No orders of the defined types were placed in this encounter.  All questions were answered. The patient knows to call the clinic with any problems, questions or concerns. No barriers to learning was detected. I spent 20 minutes counseling the patient face to face. The total time spent in the appointment was 25 minutes and more than 50% was on counseling and review of test results     Heath Lark, MD 06/20/2016 9:52 AM

## 2016-06-20 NOTE — Assessment & Plan Note (Signed)
The feeding tube site looks clean without signs of infection.

## 2016-06-20 NOTE — Assessment & Plan Note (Signed)
He has new onset of acute constipation recently. This can contribute to reduced appetite. I recommend regular use of laxatives

## 2016-06-21 ENCOUNTER — Encounter: Payer: Self-pay | Admitting: Nutrition

## 2016-06-21 ENCOUNTER — Ambulatory Visit
Admission: RE | Admit: 2016-06-21 | Discharge: 2016-06-21 | Disposition: A | Discharge status: Home / Self Care | Payer: Medicaid Other | Source: Ambulatory Visit | Attending: Radiation Oncology | Admitting: Radiation Oncology

## 2016-06-21 DIAGNOSIS — Z510 Encounter for antineoplastic radiation therapy: Secondary | ICD-10-CM | POA: Diagnosis not present

## 2016-06-21 NOTE — Progress Notes (Signed)
Patient did not show up for scheduled nutrition follow-up.

## 2016-06-22 ENCOUNTER — Ambulatory Visit
Admission: RE | Admit: 2016-06-22 | Discharge: 2016-06-22 | Disposition: A | Discharge status: Home / Self Care | Payer: Medicaid Other | Source: Ambulatory Visit | Attending: Radiation Oncology | Admitting: Radiation Oncology

## 2016-06-22 ENCOUNTER — Ambulatory Visit (HOSPITAL_BASED_OUTPATIENT_CLINIC_OR_DEPARTMENT_OTHER): Payer: No Typology Code available for payment source | Admitting: Nurse Practitioner

## 2016-06-22 VITALS — BP 104/68 | HR 60 | Temp 98.1°F | Resp 17 | Ht 73.5 in | Wt 152.4 lb

## 2016-06-22 DIAGNOSIS — E860 Dehydration: Secondary | ICD-10-CM

## 2016-06-22 DIAGNOSIS — C090 Malignant neoplasm of tonsillar fossa: Secondary | ICD-10-CM

## 2016-06-22 DIAGNOSIS — Z510 Encounter for antineoplastic radiation therapy: Secondary | ICD-10-CM | POA: Diagnosis not present

## 2016-06-22 MED ORDER — ONDANSETRON 8 MG PO TBDP
8.0000 mg | ORAL_TABLET | Freq: Once | ORAL | Status: AC
  Administered 2016-06-22: 8 mg via ORAL
  Filled 2016-06-22: qty 1

## 2016-06-22 MED ORDER — ONDANSETRON HCL 8 MG PO TABS
8.0000 mg | ORAL_TABLET | Freq: Once | ORAL | Status: DC
Start: 2016-06-22 — End: 2016-06-22

## 2016-06-22 MED ORDER — SODIUM CHLORIDE 0.9 % IJ SOLN
10.0000 mL | INTRAMUSCULAR | Status: DC | PRN
  Administered 2016-06-22: 10 mL
  Filled 2016-06-22: qty 10

## 2016-06-22 MED ORDER — SODIUM CHLORIDE 0.9 % IV SOLN
Freq: Once | INTRAVENOUS | Status: AC
  Administered 2016-06-22: 09:00:00 via INTRAVENOUS

## 2016-06-22 MED ORDER — HEPARIN SOD (PORK) LOCK FLUSH 100 UNIT/ML IV SOLN
500.0000 [IU] | Freq: Once | INTRAVENOUS | Status: AC | PRN
  Administered 2016-06-22: 500 [IU]
  Filled 2016-06-22: qty 5

## 2016-06-22 MED ORDER — ONDANSETRON HCL 40 MG/20ML IJ SOLN
Freq: Once | INTRAMUSCULAR | Status: DC
  Filled 2016-06-22: qty 4

## 2016-06-22 NOTE — Patient Instructions (Signed)

## 2016-06-23 ENCOUNTER — Ambulatory Visit: Payer: Self-pay | Admitting: Nurse Practitioner

## 2016-06-23 ENCOUNTER — Ambulatory Visit
Admission: RE | Admit: 2016-06-23 | Discharge: 2016-06-23 | Disposition: A | Discharge status: Home / Self Care | Payer: Medicaid Other | Source: Ambulatory Visit | Attending: Radiation Oncology | Admitting: Radiation Oncology

## 2016-06-23 ENCOUNTER — Ambulatory Visit (HOSPITAL_BASED_OUTPATIENT_CLINIC_OR_DEPARTMENT_OTHER): Payer: Medicaid Other | Admitting: Nurse Practitioner

## 2016-06-23 ENCOUNTER — Encounter (HOSPITAL_COMMUNITY): Payer: Self-pay

## 2016-06-23 ENCOUNTER — Ambulatory Visit: Payer: Medicaid Other | Admitting: Nutrition

## 2016-06-23 VITALS — BP 120/81 | HR 54 | Temp 98.0°F | Resp 20 | Ht 73.5 in | Wt 154.7 lb

## 2016-06-23 DIAGNOSIS — C090 Malignant neoplasm of tonsillar fossa: Secondary | ICD-10-CM

## 2016-06-23 DIAGNOSIS — E860 Dehydration: Secondary | ICD-10-CM

## 2016-06-23 DIAGNOSIS — Z510 Encounter for antineoplastic radiation therapy: Secondary | ICD-10-CM | POA: Diagnosis not present

## 2016-06-23 MED ORDER — SODIUM CHLORIDE 0.9% FLUSH
10.0000 mL | INTRAVENOUS | Status: DC | PRN
  Administered 2016-06-23: 10 mL via INTRAVENOUS
  Filled 2016-06-23: qty 10

## 2016-06-23 MED ORDER — ONDANSETRON HCL 8 MG PO TABS
ORAL_TABLET | ORAL | Status: AC
  Filled 2016-06-23: qty 1

## 2016-06-23 MED ORDER — SODIUM CHLORIDE 0.9 % IV SOLN
Freq: Once | INTRAVENOUS | Status: AC
  Administered 2016-06-23: 09:00:00 via INTRAVENOUS

## 2016-06-23 MED ORDER — SODIUM CHLORIDE 0.9 % IJ SOLN
10.0000 mL | INTRAMUSCULAR | Status: AC | PRN
  Administered 2016-06-23: 10 mL
  Filled 2016-06-23: qty 10

## 2016-06-23 MED ORDER — OSMOLITE 1.5 CAL PO LIQD: ORAL | 0 refills | Status: DC

## 2016-06-23 MED ORDER — ONDANSETRON HCL 8 MG PO TABS
8.0000 mg | ORAL_TABLET | Freq: Once | ORAL | Status: AC
Start: 2016-06-23 — End: 2016-06-23
  Administered 2016-06-23: 8 mg via ORAL

## 2016-06-23 MED ORDER — HEPARIN SOD (PORK) LOCK FLUSH 100 UNIT/ML IV SOLN
500.0000 [IU] | Freq: Once | INTRAVENOUS | Status: AC
  Administered 2016-06-23: 500 [IU] via INTRAVENOUS
  Filled 2016-06-23: qty 5

## 2016-06-23 NOTE — Patient Instructions (Signed)
Implanted Port Home Guide An implanted port is a type of central line that is placed under the skin. Central lines are used to provide IV access when treatment or nutrition needs to be given through a person's veins. Implanted ports are used for long-term IV access. An implanted port may be placed because:   You need IV medicine that would be irritating to the small veins in your hands or arms.   You need long-term IV medicines, such as antibiotics.   You need IV nutrition for a long period.   You need frequent blood draws for lab tests.   You need dialysis.  Implanted ports are usually placed in the chest area, but they can also be placed in the upper arm, the abdomen, or the leg. An implanted port has two main parts:   Reservoir. The reservoir is round and will appear as a small, raised area under your skin. The reservoir is the part where a needle is inserted to give medicines or draw blood.   Catheter. The catheter is a thin, flexible tube that extends from the reservoir. The catheter is placed into a large vein. Medicine that is inserted into the reservoir goes into the catheter and then into the vein.  HOW WILL I CARE FOR MY INCISION SITE? Do not get the incision site wet. Bathe or shower as directed by your health care provider.  HOW IS MY PORT ACCESSED? Special steps must be taken to access the port:   Before the port is accessed, a numbing cream can be placed on the skin. This helps numb the skin over the port site.   Your health care provider uses a sterile technique to access the port.  Your health care provider must put on a mask and sterile gloves.  The skin over your port is cleaned carefully with an antiseptic and allowed to dry.  The port is gently pinched between sterile gloves, and a needle is inserted into the port.  Only "non-coring" port needles should be used to access the port. Once the port is accessed, a blood return should be checked. This helps  ensure that the port is in the vein and is not clogged.   If your port needs to remain accessed for a constant infusion, a clear (transparent) bandage will be placed over the needle site. The bandage and needle will need to be changed every week, or as directed by your health care provider.   Keep the bandage covering the needle clean and dry. Do not get it wet. Follow your health care provider's instructions on how to take a shower or bath while the port is accessed.   If your port does not need to stay accessed, no bandage is needed over the port.  WHAT IS FLUSHING? Flushing helps keep the port from getting clogged. Follow your health care provider's instructions on how and when to flush the port. Ports are usually flushed with saline solution or a medicine called heparin. The need for flushing will depend on how the port is used.   If the port is used for intermittent medicines or blood draws, the port will need to be flushed:   After medicines have been given.   After blood has been drawn.   As part of routine maintenance.   If a constant infusion is running, the port may not need to be flushed.  HOW LONG WILL MY PORT STAY IMPLANTED? The port can stay in for as long as your health care   provider thinks it is needed. When it is time for the port to come out, surgery will be done to remove it. The procedure is similar to the one performed when the port was put in.  WHEN SHOULD I SEEK IMMEDIATE MEDICAL CARE? When you have an implanted port, you should seek immediate medical care if:   You notice a bad smell coming from the incision site.   You have swelling, redness, or drainage at the incision site.   You have more swelling or pain at the port site or the surrounding area.   You have a fever that is not controlled with medicine.   This information is not intended to replace advice given to you by your health care provider. Make sure you discuss any questions you have with  your health care provider.   Document Released: 08/21/2005 Document Revised: 06/11/2013 Document Reviewed: 04/28/2013 Elsevier Interactive Patient Education 2016 [employer]  Dehydration, Adult Dehydration is a condition in which you do not have enough fluid or water in your body. It happens when you take in less fluid than you lose. Vital organs such as the kidneys, brain, and heart cannot function without a proper amount of fluids. Any loss of fluids from the body can cause dehydration.  Dehydration can range from mild to severe. This condition should be treated right away to help prevent it from becoming severe. CAUSES  This condition may be caused by:  Vomiting.  Diarrhea.  Excessive sweating, such as when exercising in hot or humid weather.  Not drinking enough fluid during strenuous exercise or during an illness.  Excessive urine output.  Fever.  Certain medicines. RISK FACTORS This condition is more likely to develop in:  People who are taking certain medicines that cause the body to lose excess fluid (diuretics).   People who have a chronic illness, such as diabetes, that may increase urination.  Older adults.   People who live at high altitudes.   People who participate in endurance sports.  SYMPTOMS  Mild Dehydration  Thirst.  Dry lips.  Slightly dry mouth.  Dry, warm skin. Moderate Dehydration  Very dry mouth.   Muscle cramps.   Dark urine and decreased urine production.   Decreased tear production.   Headache.   Light-headedness, especially when you stand up from a sitting position.  Severe Dehydration  Changes in skin.   Cold and clammy skin.   Skin does not spring back quickly when lightly pinched and released.   Changes in body fluids.   Extreme thirst.   No tears.   Not able to sweat when body temperature is high, such as in hot weather.   Minimal urine production.   Changes in vital signs.   Rapid,  weak pulse (more than 100 beats per minute when you are sitting still).   Rapid breathing.   Low blood pressure.   Other changes.   Sunken eyes.   Cold hands and feet.   Confusion.  Lethargy and difficulty being awakened.  Fainting (syncope).   Short-term weight loss.   Unconsciousness. DIAGNOSIS  This condition may be diagnosed based on your symptoms. You may also have tests to determine how severe your dehydration is. These tests may include:   Urine tests.   Blood tests.  TREATMENT  Treatment for this condition depends on the severity. Mild or moderate dehydration can often be treated at home. Treatment should be started right away. Do not wait until dehydration becomes severe. Severe dehydration needs to   be treated at the hospital. Treatment for Mild Dehydration  Drinking plenty of water to replace the fluid you have lost.   Replacing minerals in your blood (electrolytes) that you may have lost.  Treatment for Moderate Dehydration  Consuming oral rehydration solution (ORS). Treatment for Severe Dehydration  Receiving fluid through an IV tube.   Receiving electrolyte solution through a feeding tube that is passed through your nose and into your stomach (nasogastric tube or NG tube).  Correcting any abnormalities in electrolytes. HOME CARE INSTRUCTIONS   Drink enough fluid to keep your urine clear or pale yellow.   Drink water or fluid slowly by taking small sips. You can also try sucking on ice cubes.  Have food or beverages that contain electrolytes. Examples include bananas and sports drinks.  Take over-the-counter and prescription medicines only as told by your health care provider.   Prepare ORS according to the manufacturer's instructions. Take sips of ORS every 5 minutes until your urine returns to normal.  If you have vomiting or diarrhea, continue to try to drink water, ORS, or both.   If you have diarrhea, avoid:   Beverages  that contain caffeine.   Fruit juice.   Milk.   Carbonated soft drinks.  Do not take salt tablets. This can lead to the condition of having too much sodium in your body (hypernatremia).  SEEK MEDICAL CARE IF:  You cannot eat or drink without vomiting.  You have had moderate diarrhea during a period of more than 24 hours.  You have a fever. SEEK IMMEDIATE MEDICAL CARE IF:   You have extreme thirst.  You have severe diarrhea.  You have not urinated in 6-8 hours, or you have urinated only a small amount of very dark urine.  You have shriveled skin.  You are dizzy, confused, or both.   This information is not intended to replace advice given to you by your health care provider. Make sure you discuss any questions you have with your health care provider.   Document Released: 08/21/2005 Document Revised: 05/12/2015 Document Reviewed: 01/06/2015 Elsevier Interactive Patient Education 2016 Elsevier Inc.  

## 2016-06-23 NOTE — Progress Notes (Signed)
Nutrition follow-up completed with patient while receiving IV fluids for tonsil cancer. Weight decreased and documented as 154 pounds down from 157.2 pounds October 9. Patient reports he is unable to eat solid foods at this time. He drinks Ensure Plus occasionally. Reports he is tolerating one can Osmolite 1.5 every 2 hours to total 7 cans daily. He flushes feeding tube with 60 cc free water before and after each bolus feeding providing 840 cc free water. Patient is drinking water throughout the day.  Estimated nutrition needs: 2460-2660 calories, 95-110 grams protein, 2.6 L fluid.  Nutrition diagnosis: Predicted suboptimal energy intake has evolved into inadequate oral intake related to tonsil cancer as evidenced by need for using feeding tube for optimal nutrition.  Intervention: Educated patient to continue strategies for giving one can Osmolite 1.5 - 7 times a day with 60 cc free water before and after each feeding. Patient educated to consume additional 16 ounces of water by mouth. Tube feeding plus free water will provide 2489 cal, 104 g protein, 2587 mL free water. Notified [facility] home[facility] of tube feeding orders. Questions were answered.  Teach back method used.  Patient has been understanding.  Monitoring, evaluation, goals: Patient will tolerate tube feeding plus oral intake to meet greater than 90% of estimated nutrition needs.  Next visit: Wednesday, October 25.  **Disclaimer: This note was dictated with voice recognition software. Similar sounding words can inadvertently be transcribed and this note may contain transcription errors which may not have been corrected upon publication of note.**

## 2016-06-24 ENCOUNTER — Ambulatory Visit (HOSPITAL_BASED_OUTPATIENT_CLINIC_OR_DEPARTMENT_OTHER): Payer: Medicaid Other

## 2016-06-24 VITALS — BP 106/74 | HR 62 | Temp 98.2°F | Resp 18

## 2016-06-24 DIAGNOSIS — C090 Malignant neoplasm of tonsillar fossa: Secondary | ICD-10-CM

## 2016-06-24 MED ORDER — ONDANSETRON HCL 8 MG PO TABS
ORAL_TABLET | ORAL | Status: AC
  Filled 2016-06-24: qty 1

## 2016-06-24 MED ORDER — SODIUM CHLORIDE 0.9 % IV SOLN
Freq: Once | INTRAVENOUS | Status: AC
  Administered 2016-06-24: 08:00:00 via INTRAVENOUS

## 2016-06-24 MED ORDER — HEPARIN SOD (PORK) LOCK FLUSH 100 UNIT/ML IV SOLN
500.0000 [IU] | Freq: Once | INTRAVENOUS | Status: AC | PRN
  Administered 2016-06-24: 500 [IU]
  Filled 2016-06-24: qty 5

## 2016-06-24 MED ORDER — SODIUM CHLORIDE 0.9 % IJ SOLN
10.0000 mL | INTRAMUSCULAR | Status: DC | PRN
  Administered 2016-06-24: 10 mL
  Filled 2016-06-24: qty 10

## 2016-06-24 NOTE — Patient Instructions (Signed)

## 2016-06-26 ENCOUNTER — Ambulatory Visit: Payer: No Typology Code available for payment source | Admitting: Nurse Practitioner

## 2016-06-26 ENCOUNTER — Ambulatory Visit
Admission: RE | Admit: 2016-06-26 | Discharge: 2016-06-26 | Disposition: A | Discharge status: Home / Self Care | Payer: Medicaid Other | Source: Ambulatory Visit | Attending: Radiation Oncology | Admitting: Radiation Oncology

## 2016-06-26 ENCOUNTER — Ambulatory Visit: Payer: Medicaid Other

## 2016-06-26 ENCOUNTER — Ambulatory Visit
Admission: RE | Admit: 2016-06-26 | Discharge: 2016-06-26 | Disposition: A | Discharge status: Home / Self Care | Payer: No Typology Code available for payment source | Source: Ambulatory Visit | Attending: Radiation Oncology | Admitting: Radiation Oncology

## 2016-06-26 ENCOUNTER — Other Ambulatory Visit: Payer: Self-pay | Admitting: *Deleted

## 2016-06-26 ENCOUNTER — Telehealth: Payer: Self-pay | Admitting: *Deleted

## 2016-06-26 ENCOUNTER — Other Ambulatory Visit: Payer: Self-pay

## 2016-06-26 ENCOUNTER — Encounter (HOSPITAL_COMMUNITY): Payer: Self-pay

## 2016-06-26 VITALS — BP 101/65 | HR 60 | Temp 98.6°F | Resp 18 | Ht 73.5 in | Wt 152.3 lb

## 2016-06-26 VITALS — BP 99/76 | HR 78 | Temp 98.5°F | Resp 10 | Wt 152.0 lb

## 2016-06-26 DIAGNOSIS — R079 Chest pain, unspecified: Secondary | ICD-10-CM

## 2016-06-26 DIAGNOSIS — C090 Malignant neoplasm of tonsillar fossa: Secondary | ICD-10-CM

## 2016-06-26 DIAGNOSIS — R300 Dysuria: Secondary | ICD-10-CM

## 2016-06-26 DIAGNOSIS — Z510 Encounter for antineoplastic radiation therapy: Secondary | ICD-10-CM | POA: Diagnosis not present

## 2016-06-26 LAB — URINALYSIS, MICROSCOPIC - CHCC
BILIRUBIN (URINE): NEGATIVE
Blood: NEGATIVE
Glucose: NEGATIVE mg/dL
KETONES: NEGATIVE mg/dL
LEUKOCYTE ESTERASE: NEGATIVE
NITRITE: NEGATIVE
Protein: NEGATIVE mg/dL
RBC / HPF: NEGATIVE (ref 0–2)
Specific Gravity, Urine: 1.015 (ref 1.003–1.035)
Urobilinogen, UR: 0.2 mg/dL (ref 0.2–1)
pH: 6 (ref 4.6–8.0)

## 2016-06-26 MED ORDER — ONDANSETRON HCL 4 MG/2ML IJ SOLN
8.0000 mg | Freq: Once | INTRAMUSCULAR | Status: AC
  Administered 2016-06-26: 8 mg via INTRAVENOUS

## 2016-06-26 MED ORDER — HEPARIN SOD (PORK) LOCK FLUSH 100 UNIT/ML IV SOLN
500.0000 [IU] | Freq: Once | INTRAVENOUS | Status: AC | PRN
  Administered 2016-06-26: 500 [IU]
  Filled 2016-06-26: qty 5

## 2016-06-26 MED ORDER — SODIUM CHLORIDE 0.9 % IV SOLN: Freq: Once | INTRAVENOUS | Status: DC

## 2016-06-26 MED ORDER — SODIUM CHLORIDE 0.9 % IV SOLN
Freq: Once | INTRAVENOUS | Status: AC
  Administered 2016-06-26: 10:00:00 via INTRAVENOUS

## 2016-06-26 MED ORDER — SODIUM CHLORIDE 0.9 % IJ SOLN
10.0000 mL | INTRAMUSCULAR | Status: DC | PRN
  Administered 2016-06-26: 10 mL
  Filled 2016-06-26: qty 10

## 2016-06-26 MED ORDER — ONDANSETRON HCL 4 MG/2ML IJ SOLN
INTRAMUSCULAR | Status: AC
  Filled 2016-06-26: qty 4

## 2016-06-26 NOTE — Progress Notes (Signed)
PAIN: He rates his pain as a 5 on a scale of 0-10. intermittent and aching over anterior left upper chest.   SWALLOWING/DIET: Pt has had dysphagia for solids. Pt reports a Drinking water and Ensure PO-2 cans a day, GT feeding formula-Osmolite, 7 cans per day, 60 ml H20 flush before and after. Peg tube site appearance-great appearance, slight yellow crusting, cleansed and applied a clean 4x4. Oral exam reveals mild erythema with white and blood streaked-thick sputum.  BOWEL: Pt reports Nausea. Having bowel movements every other day.  Reports he had an episode this morning of green stool with blood.  All other bowel movements have been brown and formed.  SKIN: Skin exam reveals Hyperpigmentation. Pt continues to apply Sonafine as directed. OTHER: Pt complains of fatigue, weakness and poor appetite. Getting daily hydration infusions.   WEIGHT/VS: BP 99/76   Pulse 78   Temp 98.5 F (36.9 C) (Oral)   Resp 10   Wt 152 lb (68.9 kg)   BMI 19.78 kg/m  Wt Readings from Last 3 Encounters:  06/26/16 152 lb (68.9 kg)  06/23/16 154 lb 11.2 oz (70.2 kg)  06/22/16 152 lb 6.4 oz (69.1 kg)   Orthostatic VS standing: BP- 97/69, P-95, Pox-100

## 2016-06-26 NOTE — Progress Notes (Signed)
Weekly Management Note:  Outpatient    ICD-9-CM ICD-10-CM   1. 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  CBCT/MVCT images/Port film x-rays were reviewed.  The chart was checked.  PAIN: He rates his pain as a 5 on a scale of 0-10. intermittent and aching over anterior left upper chest.  He also acknowledged L chest pain/pressure this weekend that waxed and waned and was addressed with aspirin over past several days.  SWALLOWING/DIET: Weight is stable Pt has had dysphagia for solids. Pt reports a Drinking water and Ensure PO-2 cans a day, GT feeding formula-Osmolite, 7 cans per day, 60 ml H20 flush before and after. Peg tube site appearance-great appearance, slight yellow crusting, cleansed and applied a clean 4x4. Oral exam reveals mild erythema with white and blood streaked-thick sputum.  BOWEL: Pt reports Nausea. Having bowel movements every other day.  Reports he had an episode this morning of green stool with blood.  All other bowel movements have been brown and formed.  SKIN: Skin exam reveals Hyperpigmentation. Pt continues to apply Sonafine as directed. OTHER: Pt complains of fatigue, weakness and poor appetite. Getting daily hydration infusions.   Physical Findings:  Wt Readings from Last 3 Encounters:  06/26/16 152 lb 4.8 oz (69.1 kg)  06/26/16 152 lb (68.9 kg)  06/23/16 154 lb 11.2 oz (70.2 kg)    weight is 152 lb (68.9 kg). His oral temperature is 98.5 F (36.9 C). His blood pressure is 99/76 and his pulse is 78. His respiration is 10.  NAD,no tumor appreciated in left tonsil.  Confluent mucositis over oropharynx.  skin intact over neck; shrinking left upper neck mass  CBC    Component Value Date/Time   WBC 4.8 06/09/2016 0831   WBC 9.1 05/17/2016 1250   RBC 4.32 06/09/2016 0831   RBC 4.78 05/17/2016 1250   HGB 13.3 06/09/2016  0831   HCT 40.1 06/09/2016 0831   PLT 193 06/09/2016 0831   MCV 92.9 06/09/2016 0831   MCH 30.9 06/09/2016 0831   MCH 31.8 05/17/2016 1250   MCHC 33.3 06/09/2016 0831   MCHC 35.1 05/17/2016 1250   RDW 13.3 06/09/2016 0831   LYMPHSABS 0.8 (L) 06/09/2016 0831   MONOABS 0.7 06/09/2016 0831   EOSABS 0.1 06/09/2016 0831   BASOSABS 0.0 06/09/2016 0831     CMP     Component Value Date/Time   NA 141 06/09/2016 0831   K 4.8 06/09/2016 0831   CL 105 05/17/2016 1250   CO2 28 06/09/2016 0831   GLUCOSE 77 06/09/2016 0831   BUN 16.6 06/09/2016 0831   CREATININE 0.9 06/09/2016 0831   CALCIUM 9.9 06/09/2016 0831   PROT 8.0 06/09/2016 0831   ALBUMIN 3.9 06/09/2016 0831   AST 27 06/09/2016 0831   ALT 17 06/09/2016 0831   ALKPHOS 78 06/09/2016 0831   BILITOT 0.56 06/09/2016 0831   GFRNONAA >60 05/17/2016 1250   GFRAA >60 05/17/2016 1250     Impression:  The patient is tolerating radiotherapy.   Plan:  Continue radiotherapy as planned.  Heart is RRR and he had some musculoskeletal like tenderness to palpation in the left chest but he also acknowledged L chest pain/pressure this weekend that waxed and waned and was addressed with aspirin.  Medical Oncology team notified to work up as appropriate. Also notified med/onc team of  stool w/ blood recently. Labwork today is pending.     Eppie Gibson, MD

## 2016-06-26 NOTE — Telephone Encounter (Signed)
Message received from [place] that pt was c/o chest pains.   S/w pt in [facility] this morning while he is getting IVFs.  Pt reports 2 episodes of chest pain in left/ mid chest area described as pressure over this past weekend.  He denies any burning or sharp/ shooting pains, said it was difficult to describe but "pressure" seems to describe it best. He denies chest pains today.  He took a Aspirin 81 mg at onset of each episode and says the chest pains "eased off" within a few minutes.  Each time lasting no longer than 5 minutes per pt's report.   Dr. Alvy Bimler instructed for pt to go to ED next time he has any chest pains.  She also ordered referral to Cardiology.   Instructed pt to go immediately to closest ED next time he has chest pains.  Ok to take baby aspirin and then go to ED right away.  Also informed him of referral to Cardiology to assess his heart.   He verbalized understanding of instructions.

## 2016-06-27 ENCOUNTER — Encounter: Payer: Self-pay | Admitting: Hematology and Oncology

## 2016-06-27 ENCOUNTER — Ambulatory Visit (HOSPITAL_BASED_OUTPATIENT_CLINIC_OR_DEPARTMENT_OTHER): Payer: Medicaid Other | Admitting: Hematology and Oncology

## 2016-06-27 ENCOUNTER — Ambulatory Visit (HOSPITAL_BASED_OUTPATIENT_CLINIC_OR_DEPARTMENT_OTHER): Payer: Medicaid Other

## 2016-06-27 ENCOUNTER — Ambulatory Visit
Admission: RE | Admit: 2016-06-27 | Discharge: 2016-06-27 | Disposition: A | Discharge status: Home / Self Care | Payer: Medicaid Other | Source: Ambulatory Visit | Attending: Radiation Oncology | Admitting: Radiation Oncology

## 2016-06-27 ENCOUNTER — Telehealth: Payer: Self-pay | Admitting: Cardiology

## 2016-06-27 VITALS — BP 97/69 | HR 60 | Temp 98.1°F | Resp 18 | Ht 73.5 in | Wt 152.4 lb

## 2016-06-27 DIAGNOSIS — C090 Malignant neoplasm of tonsillar fossa: Secondary | ICD-10-CM | POA: Diagnosis present

## 2016-06-27 DIAGNOSIS — G893 Neoplasm related pain (acute) (chronic): Secondary | ICD-10-CM | POA: Diagnosis not present

## 2016-06-27 DIAGNOSIS — E440 Moderate protein-calorie malnutrition: Secondary | ICD-10-CM | POA: Insufficient documentation

## 2016-06-27 DIAGNOSIS — R110 Nausea: Secondary | ICD-10-CM

## 2016-06-27 DIAGNOSIS — K5900 Constipation, unspecified: Secondary | ICD-10-CM

## 2016-06-27 DIAGNOSIS — E860 Dehydration: Secondary | ICD-10-CM

## 2016-06-27 DIAGNOSIS — R0789 Other chest pain: Secondary | ICD-10-CM | POA: Diagnosis not present

## 2016-06-27 DIAGNOSIS — Z931 Gastrostomy status: Secondary | ICD-10-CM

## 2016-06-27 DIAGNOSIS — T451X5A Adverse effect of antineoplastic and immunosuppressive drugs, initial encounter: Secondary | ICD-10-CM

## 2016-06-27 DIAGNOSIS — Z510 Encounter for antineoplastic radiation therapy: Secondary | ICD-10-CM | POA: Diagnosis not present

## 2016-06-27 LAB — URINE CULTURE: Organism ID, Bacteria: NO GROWTH

## 2016-06-27 MED ORDER — SODIUM CHLORIDE 0.9 % IV SOLN
Freq: Once | INTRAVENOUS | Status: AC
  Administered 2016-06-27: 10:00:00 via INTRAVENOUS

## 2016-06-27 MED ORDER — HEPARIN SOD (PORK) LOCK FLUSH 100 UNIT/ML IV SOLN
500.0000 [IU] | Freq: Once | INTRAVENOUS | Status: AC | PRN
  Administered 2016-06-27: 500 [IU]
  Filled 2016-06-27: qty 5

## 2016-06-27 MED ORDER — SODIUM CHLORIDE 0.9 % IJ SOLN
10.0000 mL | INTRAMUSCULAR | Status: DC | PRN
  Administered 2016-06-27: 10 mL
  Filled 2016-06-27: qty 10

## 2016-06-27 MED ORDER — POLYETHYLENE GLYCOL 3350 17 G PO PACK: 17.0000 g | PACK | Freq: Every day | ORAL | 9 refills | Status: DC

## 2016-06-27 MED ORDER — PROMETHAZINE HCL 25 MG/ML IJ SOLN
12.5000 mg | Freq: Once | INTRAMUSCULAR | Status: AC
  Administered 2016-06-27: 12.5 mg via INTRAVENOUS
  Filled 2016-06-27: qty 1

## 2016-06-27 MED FILL — POLYETHYLENE GLYCOL 3350: 15 days supply | Qty: 255 | Fill #0

## 2016-06-27 NOTE — Assessment & Plan Note (Signed)
The feeding tube site looks clean without signs of infection.

## 2016-06-27 NOTE — Progress Notes (Signed)
[place] OFFICE PROGRESS NOTE  Patient Care Team: Rogers Blocker, MD as PCP - General (Internal Medicine) Heath Lark, MD as Consulting Physician (Hematology and Oncology) Jodi Marble, MD as Consulting Physician (Otolaryngology) Leota Sauers, RN as Oncology Nurse Navigator Eppie Gibson, MD as Attending Physician (Radiation Oncology) Lenn Cal, DDS as Consulting Physician (Dentistry) Karie Mainland, RD as Dietitian (Nutrition)  SUMMARY OF ONCOLOGIC HISTORY:   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[employer])   04/04/2016 Initial Diagnosis    He saw ENT and had laryngoscopy and biopsy of left tonsil      04/04/2016 Pathology Results    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 Chronic small-vessel ischemic changes of the cerebral hemispheric white matter.       04/04/2016 Imaging    CT neck showed left tonsillar mass with diameter of 19 x 24 mm consistent with 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 necrosis. Suspicious level 5/supraclavicular nodes on the left. Scar type density in the right upper lobe is more prominent than was seen on a CT scan of the chest 10/06/2014      04/21/2016 PET scan    Asymmetric hypermetabolic soft tissue prominence in left palatine tonsil, consistent with known primary left tonsillar squamous cell Carcinoma. Left level 2 cervical lymphadenopathy, consistent with metastatic disease. No evidence of metastatic disease within the chest, abdomen, or pelvis. Stable 14 mm ground-glass and part solid nodular opacity in right lung apex compared to previous CT in 2016. This shows low-grade metabolic activity. Low-grade bronchogenic adenocarcinoma cannot be excluded. Consider surgical resection versus continued followup by CT in 12 months      05/17/2016 Procedure    Successful placement of a 20 French pull  through gastrostomy tube & right IJ approach Power Falcon.      05/22/2016 -  Chemotherapy    The patient had palonosetron (ALOXI) injection 0.25 mg, 0.25 mg, Intravenous,  Once, 1 of 3 cycles  CISplatin (PLATINOL) 192 mg in sodium chloride 0.9 % 500 mL chemo infusion, 100 mg/m2 = 192 mg, Intravenous,  Once, 1 of 3 cycles  fosaprepitant (EMEND) 150 mg, dexamethasone (DECADRON) 12 mg in sodium chloride 0.9 % 145 mL IVPB, , Intravenous,  Once, 1 of 3 cycles  for chemotherapy treatment.        05/23/2016 -  Radiation Therapy    He received concurrent radiation therapy       INTERVAL HISTORY: Please see below for problem oriented charting. He is seen as part of his weekly supportive care visit. Over the weekend, he had intermittent atypical chest pressure radiating down to the left arm that comes and goes, relieved by aspirin. The symptoms are not preceded by sensation of dizziness, shortness of breath or diaphoresis. The patient does have history of cardiac risk factors. He has some mucositis pain but has not started using pain medicine. He is mildly constipated with persistent nausea but no recent vomiting. He has progressive weight loss. He is able to swallow liquids but due to loss of taste sensation, has not been able to tolerate oral food. He denies recent choking. No recent fever, chills or cough.  REVIEW OF SYSTEMS:   Constitutional: Denies fevers, chills  Eyes: Denies blurriness of vision Respiratory: Denies cough, dyspnea or wheezes Cardiovascular: Denies palpitation, chest discomfort or lower extremity swelling Skin: Denies abnormal skin rashes  Lymphatics: Denies new lymphadenopathy or easy bruising Neurological:Denies numbness, tingling or new weaknesses Behavioral/Psych: Mood is stable, no new changes  All other systems were reviewed with the patient and are negative.  I have reviewed the past medical history, past surgical history, social history and family history  with the patient and they are unchanged from previous note.  ALLERGIES:  has No Known Allergies.  MEDICATIONS:  Current Outpatient Prescriptions  Medication Sig Dispense Refill  . Ethyl Alcohol, Skin Cleanser, (PURELL INSTANT HAND SANITIZER) 65 % FOAM Use as needed to sanitize hands 160 mL 5  . lidocaine (XYLOCAINE) 2 % solution Patient: Mix 1part 2% viscous lidocaine, 1part H20. Swallow 45mL of this mixture, 71min before meals and at bedtime, up to QID PRN soreness 100 mL 5  . lidocaine-prilocaine (EMLA) cream Apply to affected area once 30 g 3  . LORazepam (ATIVAN) 0.5 MG tablet Take 1 tablet up to BID PRN nausea or anxiety.  Can be taken 30 min before radiotherapy for anxiety. 50 tablet 0  . Nutritional Supplements (FEEDING SUPPLEMENT, OSMOLITE 1.5 CAL,) LIQD Give one can Osmolite 1.5 - 7 times daily, every 2 hours with 60 cc free water before and after each feeding. 7 Bottle 0  . ondansetron (ZOFRAN) 8 MG tablet Take 1 tablet (8 mg total) by mouth every 8 (eight) hours as needed. Start on the third day after chemotherapy. 60 tablet 1  . polyethylene glycol (MIRALAX) packet Take 17 g by mouth daily. 14 each 9  . prochlorperazine (COMPAZINE) 10 MG tablet Take 1 tablet (10 mg total) by mouth every 6 (six) hours as needed (Nausea or vomiting). 60 tablet 1  . sodium fluoride (FLUORISHIELD) 1.1 % GEL dental gel Instill one drop of gel per tooth space of fluoride tray. Place over teeth for 5 minutes. Remove. Spit out excess. Repeat nightly. 120 mL prn  . sucralfate (CARAFATE) 1 g tablet Dissolve 1 tablet in 10 mL H20 and swallow up to QID PRN sore throat 40 tablet 5  . fentaNYL (DURAGESIC - DOSED MCG/HR) 25 MCG/HR patch Place 1 patch (25 mcg total) onto the skin every 3 (three) days. (Patient not taking: Reported on 06/27/2016) 5 patch 0  . Morphine Sulfate (MORPHINE CONCENTRATE) 10 mg / 0.5 ml concentrated solution Take 0.5 mLs (10 mg total) by mouth every 2 (two) hours as needed for severe pain.  (Patient not taking: Reported on 06/27/2016) 240 mL 0   No current facility-administered medications for this visit.    Facility-Administered Medications Ordered in Other Visits  Medication Dose Route Frequency Provider Last Rate Last Dose  . sodium chloride flush (NS) 0.9 % injection 10 mL  10 mL Intracatheter PRN Heath Lark, MD   10 mL at 05/24/16 1615    PHYSICAL EXAMINATION: ECOG PERFORMANCE STATUS: 1 - Symptomatic but completely ambulatory  Vitals:   06/27/16 0849  BP: 97/69  Pulse: 60  Resp: 18  Temp: 98.1 F (36.7 C)   Filed Weights   06/27/16 0849  Weight: 152 lb 6.4 oz (69.1 kg)    GENERAL:alert, no distress and comfortable. He looks thin and cachectic SKIN: skin color, texture, turgor are normal, no rashes or significant lesions EYES: normal, Conjunctiva are pink and non-injected, sclera clear OROPHARYNX: Noted mucositis. No thrush LYMPH:  no palpable lymphadenopathy in the cervical, axillary or inguinal LUNGS: clear to auscultation and percussion with normal breathing effort HEART: regular rate & rhythm and no murmurs and no lower extremity edema ABDOMEN:abdomen soft, non-tender and normal  bowel sounds. Feeding tube site looks okay  Musculoskeletal:no cyanosis of digits and no clubbing  NEURO: alert & oriented x 3 with fluent speech, no focal motor/sensory deficits  LABORATORY DATA:  I have reviewed the data as listed    Component Value Date/Time   NA 141 06/09/2016 0831   K 4.8 06/09/2016 0831   CL 105 05/17/2016 1250   CO2 28 06/09/2016 0831   GLUCOSE 77 06/09/2016 0831   BUN 16.6 06/09/2016 0831   CREATININE 0.9 06/09/2016 0831   CALCIUM 9.9 06/09/2016 0831   PROT 8.0 06/09/2016 0831   ALBUMIN 3.9 06/09/2016 0831   AST 27 06/09/2016 0831   ALT 17 06/09/2016 0831   ALKPHOS 78 06/09/2016 0831   BILITOT 0.56 06/09/2016 0831   GFRNONAA >60 05/17/2016 1250   GFRAA >60 05/17/2016 1250    No results found for: SPEP, UPEP  Lab Results  Component  Value Date   WBC 4.8 06/09/2016   NEUTROABS 3.3 06/09/2016   HGB 13.3 06/09/2016   HCT 40.1 06/09/2016   MCV 92.9 06/09/2016   PLT 193 06/09/2016      Chemistry      Component Value Date/Time   NA 141 06/09/2016 0831   K 4.8 06/09/2016 0831   CL 105 05/17/2016 1250   CO2 28 06/09/2016 0831   BUN 16.6 06/09/2016 0831   CREATININE 0.9 06/09/2016 0831      Component Value Date/Time   CALCIUM 9.9 06/09/2016 0831   ALKPHOS 78 06/09/2016 0831   AST 27 06/09/2016 0831   ALT 17 06/09/2016 0831   BILITOT 0.56 06/09/2016 0831      ASSESSMENT & PLAN:  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HCC) He tolerated treatment well with mild expected side effects. He will continue to be seen on a weekly basis for supportive care  Cancer associated pain He has some mild mucositis, expected side effects of treatment. I recommend he takes liquid morphine sulfate as needed for pain  Chemotherapy-induced nausea The patient felt much better with IV fluid resuscitation and IV anti-emetics We discussed potential use of daily IV fluids for additional hydration support and he agreed to proceed.  Constipation, acute The patient have loss of appetite and weight loss that could be exacerbated by constipation. I will proceed to add daily Miralax to his regimen and switch his anti-emetics to Phenergan  Protein-calorie malnutrition, moderate ([place]) He has progressive recent weight loss related to nausea. I recommend him to increase nutritional supplement as tolerated via the feeding tube  He will continue regular visits with dietitian  S/P gastrostomy ([place]) The feeding tube site looks clean without signs of infection.  Atypical chest pain He had symptoms of atypical chest pain, described as pressure. Examination is benign. I recommend cardiology evaluation.   No orders of the defined types were placed in this encounter.  All questions were answered. The patient knows to call the clinic with  any problems, questions or concerns. No barriers to learning was detected. I spent 30 minutes counseling the patient face to face. The total time spent in the appointment was 40 minutes and more than 50% was on counseling and review of test results     Heath Lark, MD 06/27/2016 9:37 AM

## 2016-06-27 NOTE — Assessment & Plan Note (Signed)
He had symptoms of atypical chest pain, described as pressure. Examination is benign. I recommend cardiology evaluation.

## 2016-06-27 NOTE — Assessment & Plan Note (Signed)
The patient felt much better with IV fluid resuscitation and IV anti-emetics We discussed potential use of daily IV fluids for additional hydration support and he agreed to proceed.

## 2016-06-27 NOTE — Assessment & Plan Note (Signed)
He tolerated treatment well with mild expected side effects. He will continue to be seen on a weekly basis for supportive care

## 2016-06-27 NOTE — Telephone Encounter (Signed)
States message on answering machine said he would have a co-pay when he comes for OV.  He states he cannot afford a co pay and shouldn't have one.   Informed pt that it was a "generic" message left explaining to be prepared to pay a co pay at time of check-in.  But explained that if his insurance didn't require co pay then he wouldn't have one. He stated then he would be here at [place] since he didn't have to pay one.

## 2016-06-27 NOTE — Assessment & Plan Note (Signed)
He has progressive recent weight loss related to nausea. I recommend him to increase nutritional supplement as tolerated via the feeding tube  He will continue regular visits with dietitian

## 2016-06-27 NOTE — Assessment & Plan Note (Signed)
The patient have loss of appetite and weight loss that could be exacerbated by constipation. I will proceed to add daily Miralax to his regimen and switch his anti-emetics to Phenergan

## 2016-06-27 NOTE — Patient Instructions (Signed)

## 2016-06-27 NOTE — Telephone Encounter (Signed)
New Message:     Pt says he needs to talk to soembody, he says it is very important. He says it is concerning his appointment on Thursday.

## 2016-06-27 NOTE — Assessment & Plan Note (Signed)
He has some mild mucositis, expected side effects of treatment. I recommend he takes liquid morphine sulfate as needed for pain

## 2016-06-28 ENCOUNTER — Ambulatory Visit (HOSPITAL_BASED_OUTPATIENT_CLINIC_OR_DEPARTMENT_OTHER): Payer: Medicaid Other

## 2016-06-28 ENCOUNTER — Ambulatory Visit
Admission: RE | Admit: 2016-06-28 | Discharge: 2016-06-28 | Disposition: A | Discharge status: Home / Self Care | Payer: Medicaid Other | Source: Ambulatory Visit | Attending: Radiation Oncology | Admitting: Radiation Oncology

## 2016-06-28 ENCOUNTER — Ambulatory Visit: Payer: Medicaid Other | Admitting: Nutrition

## 2016-06-28 ENCOUNTER — Encounter: Payer: Self-pay | Admitting: Nutrition

## 2016-06-28 VITALS — BP 109/77 | HR 67 | Temp 98.1°F | Resp 17

## 2016-06-28 DIAGNOSIS — Z510 Encounter for antineoplastic radiation therapy: Secondary | ICD-10-CM | POA: Diagnosis not present

## 2016-06-28 DIAGNOSIS — C090 Malignant neoplasm of tonsillar fossa: Secondary | ICD-10-CM

## 2016-06-28 MED ORDER — HEPARIN SOD (PORK) LOCK FLUSH 100 UNIT/ML IV SOLN
500.0000 [IU] | Freq: Once | INTRAVENOUS | Status: AC | PRN
  Administered 2016-06-28: 500 [IU]
  Filled 2016-06-28: qty 5

## 2016-06-28 MED ORDER — SODIUM CHLORIDE 0.9 % IV SOLN
Freq: Once | INTRAVENOUS | Status: AC
  Administered 2016-06-28: 10:00:00 via INTRAVENOUS

## 2016-06-28 MED ORDER — SODIUM CHLORIDE 0.9 % IJ SOLN
10.0000 mL | INTRAMUSCULAR | Status: DC | PRN
  Administered 2016-06-28: 10 mL
  Filled 2016-06-28: qty 10

## 2016-06-28 MED ORDER — PROCHLORPERAZINE MALEATE 10 MG PO TABS
10.0000 mg | ORAL_TABLET | Freq: Four times a day (QID) | ORAL | Status: DC | PRN
  Administered 2016-06-28: 10 mg via ORAL

## 2016-06-28 MED ORDER — PROCHLORPERAZINE MALEATE 10 MG PO TABS
ORAL_TABLET | ORAL | Status: AC
  Filled 2016-06-28: qty 1

## 2016-06-28 NOTE — Patient Instructions (Signed)

## 2016-06-28 NOTE — Progress Notes (Signed)
Nutrition follow-up completed with patient receiving radiation therapy for tonsil cancer. Patient has been receiving daily IV fluids. Weight decreased and documented as 151.8 pounds today, down from 154 pounds. Note patient's normal weight is approximately 140-160 pounds. Patient unable to eat by mouth. He reports he is tolerating about 5 cans of Osmolite 1.5 daily which is less than his goal of 7 cans Patient flushes his feeding tube with 60 cc free water before and after bolus feedings. Patient reports he has started MiraLAX, and reports he had a bowel movement this morning.  Estimated nutrition needs: 2460-2660 calories, 95-110 grams protein, 2.6 L fluid.  Nutrition diagnosis: Inadequate oral intake continues.  Intervention: Educated patient to increase bolus feedings to 2 cans every 4 hours 3 times a day plus one can once a day to total 7 cans daily and continue 60 cc free water before and after bolus feedings. Patient educated to continue 16 oz. of water throughout the day. Tube feeding plus free water provides 2489 cal, 104 g protein, 2587 mL free water. Patient reports he has received his formula and [facility]. Observed patient giving 2 cans of Osmolite 1.5 with water before and after and he correctly demonstrated appropriate technique.  He reports good tolerance and no nausea.  Monitoring, evaluation, goals:  Patient will work to tolerate 7 cans of Osmolite 1.5 daily to meet greater than 90% of estimated nutrition needs.  Next visit: Monday, October 30, during his IV fluids in the chemotherapy room.

## 2016-06-29 ENCOUNTER — Ambulatory Visit
Admission: RE | Admit: 2016-06-29 | Discharge: 2016-06-29 | Disposition: A | Discharge status: Home / Self Care | Payer: Medicaid Other | Source: Ambulatory Visit | Attending: Radiation Oncology | Admitting: Radiation Oncology

## 2016-06-29 ENCOUNTER — Ambulatory Visit (HOSPITAL_BASED_OUTPATIENT_CLINIC_OR_DEPARTMENT_OTHER): Payer: Medicaid Other | Admitting: Nurse Practitioner

## 2016-06-29 ENCOUNTER — Ambulatory Visit (INDEPENDENT_AMBULATORY_CARE_PROVIDER_SITE_OTHER): Payer: Medicaid Other | Admitting: Cardiology

## 2016-06-29 ENCOUNTER — Encounter: Payer: Self-pay | Admitting: Cardiology

## 2016-06-29 ENCOUNTER — Encounter (HOSPITAL_COMMUNITY): Payer: Self-pay

## 2016-06-29 VITALS — BP 112/73 | HR 57 | Temp 98.4°F | Resp 18 | Ht 73.5 in | Wt 156.5 lb

## 2016-06-29 VITALS — BP 98/72 | HR 78 | Ht 73.0 in | Wt 155.4 lb

## 2016-06-29 DIAGNOSIS — Z510 Encounter for antineoplastic radiation therapy: Secondary | ICD-10-CM | POA: Diagnosis not present

## 2016-06-29 DIAGNOSIS — J449 Chronic obstructive pulmonary disease, unspecified: Secondary | ICD-10-CM | POA: Diagnosis not present

## 2016-06-29 DIAGNOSIS — R079 Chest pain, unspecified: Secondary | ICD-10-CM

## 2016-06-29 DIAGNOSIS — C090 Malignant neoplasm of tonsillar fossa: Secondary | ICD-10-CM

## 2016-06-29 DIAGNOSIS — C801 Malignant (primary) neoplasm, unspecified: Secondary | ICD-10-CM

## 2016-06-29 DIAGNOSIS — E860 Dehydration: Secondary | ICD-10-CM

## 2016-06-29 MED ORDER — HEPARIN SOD (PORK) LOCK FLUSH 100 UNIT/ML IV SOLN
500.0000 [IU] | Freq: Once | INTRAVENOUS | Status: AC | PRN
  Administered 2016-06-29: 500 [IU]
  Filled 2016-06-29: qty 5

## 2016-06-29 MED ORDER — SODIUM CHLORIDE 0.9 % IV SOLN
Freq: Once | INTRAVENOUS | Status: AC
  Administered 2016-06-29: 09:00:00 via INTRAVENOUS

## 2016-06-29 MED ORDER — SODIUM CHLORIDE 0.9 % IJ SOLN
10.0000 mL | INTRAMUSCULAR | Status: DC | PRN
  Administered 2016-06-29: 10 mL
  Filled 2016-06-29: qty 10

## 2016-06-29 NOTE — Patient Instructions (Signed)
Medication Instructions: . Your physician recommends that you continue on your current medications as directed. Please refer to the Current Medication list given to you today.   Labwork: None ordered  Testing/Procedures: Your physician has requested that you have a stress echocardiogram. For further information please visit [email or web address]. Please follow instruction sheet as given.   Follow-Up: Your physician recommends that you schedule a follow-up appointment in: DEPENDING UPON YOUR TEST RESULTS   Any Other Special Instructions Will Be Listed Below (If Applicable).  Exercise Stress Echocardiogram An exercise stress echocardiogram is a heart (cardiac) test used to check the function of your heart. This test may also be called an exercise stress echocardiography or stress echo. This stress test will check how well your heart muscle and valves are working and determine if your heart muscle is getting enough blood. You will exercise on a treadmill to naturally increase or stress the functioning of your heart.  An echocardiogram uses sound waves (ultrasound) to produce an image of your heart. If your heart does not work normally, it may indicate coronary artery disease with poor coronary blood supply. The coronary arteries are the arteries that bring blood and oxygen to your heart. LET [facility] CARE PROVIDER KNOW ABOUT:  Any allergies you have.  All medicines you are taking, including vitamins, herbs, eye drops, creams, and over-the-counter medicines.  Previous problems you or members of your family have had with the use of anesthetics.  Any blood disorders you have.  Previous surgeries you have had.  Medical conditions you have.  Possibility of pregnancy, if this applies. RISKS AND COMPLICATIONS Generally, this is a safe procedure. However, as with any procedure, complications can occur. Possible complications can include:  You develop pain or pressure in the following  areas:  Chest.  Jaw or neck.  Between your shoulder blades.  Radiating down your left arm.  Dizziness or lightheadedness.  Shortness of breath.  Increased or irregular heartbeat.  Nausea or vomiting.  Heart attack (rare). BEFORE THE PROCEDURE  Avoid all forms of caffeine for 24 hours before your test or as directed by your health care provider. This includes coffee, tea (even decaffeinated tea), caffeinated sodas, chocolate, cocoa, and certain pain medicines.  Follow your health care provider's instructions regarding eating and drinking before the test.  Take your medicines as directed at regular times with water unless instructed otherwise. Exceptions may include:  If you have diabetes, ask how you are to take your insulin or pills. It is common to adjust insulin dosing the morning of the test.  If you are taking beta-blocker medicines, it is important to talk to your health care provider about these medicines well before the date of your test. Taking beta-blocker medicines may interfere with the test. In some cases, these medicines need to be changed or stopped 24 hours or more before the test.  If you wear a nitroglycerin patch, it may need to be removed prior to the test. Ask your health care provider if the patch should be removed before the test.  If you use an inhaler for any breathing condition, bring it with you to the test.  If you are an outpatient, bring a snack so you can eat right after the stress phase of the test.  Do not smoke for 4 hours prior to the test or as directed by your health care provider.  Wear loose-fitting clothes and comfortable shoes for the test. This test involves walking on a treadmill. PROCEDURE   Multiple  electrodes will be put on your chest. If needed, small areas of your chest may be shaved to get better contact with the electrodes. Once the electrodes are attached to your body, multiple wires will be attached to the electrodes, and  your heart rate will be monitored.  You will have an echocardiogram done at rest.  To produce this image of your heart, gel is applied to your chest, and a wand-like tool (transducer) is moved over the chest. The transducer sends the sound waves through the chest to create the moving images of your heart.  You may need an IV to receive a medication that improves the quality of the pictures.  You will then walk on a treadmill. The treadmill will be started at a slow pace. The treadmill speed and incline will gradually be increased to raise your heart rate.  At the peak of exercise, the treadmill will be stopped. You will lie down immediately on a bed so that a second echocardiogram can be done to visualize your heart's motion with exercise.  The test usually takes 30-60 minutes to complete. AFTER THE PROCEDURE  Your heart rate and blood pressure will be monitored after the test.  You may return to your normal schedule, including diet, activities, and medicines, unless your health care provider tells you otherwise.   This information is not intended to replace advice given to you by your health care provider. Make sure you discuss any questions you have with your health care provider.   Document Released: 08/25/2004 Document Revised: 08/26/2013 Document Reviewed: 04/28/2013 Elsevier Interactive Patient Education [employer].    If you need a refill on your cardiac medications before your next appointment, please call your pharmacy.

## 2016-06-29 NOTE — Progress Notes (Signed)
Cardiology Office Note  Date:  06/29/2016   ID:  [name] [name], DOB 02/07/1956, MRN [id number]  PCP:  Rogers Blocker, MD  Cardiologist:   Dr. Johnsie Cancel    Chief Complaint  Patient presents with  . Chest Pain      History of Present Illness: [name] [name] is a 60 y.o. male who presents for chest pain.  Last seen by Dr. Johnsie Cancel in 09/2014.    Today he presents for episode  2 days ago while doing morning care.  Described as pressure that built up and went to scapula.  No SOB or nausea or diaphoresis.  He took asa and it went away lasted 5 min he did not go to ER because he was headed to the cancer center.  He had one other episode that was similar.  None since.  He has not had any with activity.    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   Most of the time he gets this in.  His BP is on lower end but it does run this low.Marland Kitchen  He is not dizzy or lightheaded unless he gets up too fast.  He has one more chemo to complete, and several more radiation treatments.      Other hx of hepatitis, high cholesterol, ex-smoker, COPD seen in ER 09/02/14  with intermittent chest pain for  2 days lasting for several minutes to hours at a time- had echo stress test and this was normal.    EKG is stable.   Past Medical History:  Diagnosis Date  . Anxiety   . Cancer ([place]) 04/04/2016   cancer of left tonsil  . COPD (chronic obstructive pulmonary disease) ([place])   . Hepatitis   . Hepatitis C    Treatment x 8 weeks 6-7 months ago [facility]  . Sinus congestion   . Stomach ulcer     Past Surgical History:  Procedure Laterality Date  . COLONOSCOPY WITH PROPOFOL N/A 04/14/2015   Procedure: COLONOSCOPY WITH PROPOFOL;  Surgeon: Arta Silence, MD;  Location: [facility];  Service: Endoscopy;  Laterality: N/A;  . HERNIA REPAIR    . IR GENERIC HISTORICAL  05/17/2016   IR FLUORO GUIDE PORT INSERTION RIGHT 05/17/2016 [name] Cadet, MD [facility]    . IR GENERIC HISTORICAL  05/17/2016   IR US GUIDE VASC ACCESS RIGHT 05/17/2016 [name] Cadet, MD [facility]  . IR GENERIC HISTORICAL  05/17/2016   IR GASTROSTOMY TUBE MOD SED 05/17/2016 [name] Cadet, MD [facility]  . MULTIPLE EXTRACTIONS WITH ALVEOLOPLASTY N/A 05/03/2016   Procedure: Multiple extractions with alveoloplasty and gross debridement of remaining teeth.;  Surgeon: Lenn Cal, DDS;  Location: [facility] ORS;  Service: Oral Surgery;  Laterality: N/A;  . STOMACH SURGERY     20 years     Current Outpatient Prescriptions  Medication Sig Dispense Refill  . Ethyl Alcohol, Skin Cleanser, (PURELL INSTANT HAND SANITIZER) 65 % FOAM Use as needed to sanitize hands 160 mL 5  . fentaNYL (DURAGESIC - DOSED MCG/HR) 25 MCG/HR patch Place 1 patch (25 mcg total) onto the skin every 3 (three) days. 5 patch 0  . lidocaine (XYLOCAINE) 2 % solution Patient: Mix 1part 2% viscous lidocaine, 1part H20. Swallow 50mL of this mixture, 15min before meals and at bedtime, up to QID PRN soreness 100 mL 5  . lidocaine-prilocaine (EMLA) cream Apply to affected area once 30 g 3  .  LORazepam (ATIVAN) 0.5 MG tablet Take 1 tablet up to BID PRN nausea or anxiety.  Can be taken 30 min before radiotherapy for anxiety. 50 tablet 0  . Morphine Sulfate (MORPHINE CONCENTRATE) 10 mg / 0.5 ml concentrated solution Take 0.5 mLs (10 mg total) by mouth every 2 (two) hours as needed for severe pain. 240 mL 0  . Nutritional Supplements (FEEDING SUPPLEMENT, OSMOLITE 1.5 CAL,) LIQD Give one can Osmolite 1.5 - 7 times daily, every 2 hours with 60 cc free water before and after each feeding. 7 Bottle 0  . ondansetron (ZOFRAN) 8 MG tablet Take 1 tablet (8 mg total) by mouth every 8 (eight) hours as needed. Start on the third day after chemotherapy. 60 tablet 1  . polyethylene glycol (MIRALAX) packet Take 17 g by mouth daily. 14 each 9  . prochlorperazine (COMPAZINE) 10 MG tablet Take 1 tablet (10 mg total) by mouth every 6 (six)  hours as needed (Nausea or vomiting). 60 tablet 1  . sodium fluoride (FLUORISHIELD) 1.1 % GEL dental gel Instill one drop of gel per tooth space of fluoride tray. Place over teeth for 5 minutes. Remove. Spit out excess. Repeat nightly. 120 mL prn  . sucralfate (CARAFATE) 1 g tablet Dissolve 1 tablet in 10 mL H20 and swallow up to QID PRN sore throat 40 tablet 5   No current facility-administered medications for this visit.    Facility-Administered Medications Ordered in Other Visits  Medication Dose Route Frequency Provider Last Rate Last Dose  . sodium chloride flush (NS) 0.9 % injection 10 mL  10 mL Intracatheter PRN Heath Lark, MD   10 mL at 05/24/16 1615    Allergies:   Review of patient's allergies indicates no known allergies.    Social History:  The patient  reports that he quit smoking about 5 months ago. His smoking use included Cigarettes. He has a 15.00 pack-year smoking history. He has never used smokeless tobacco. He reports that he does not drink alcohol or use drugs.   Family History:  The patient's family history includes Anxiety disorder in his mother; Cancer in his father; Diabetes in his mother; Hypertension in his mother.    ROS:  General:no colds or fevers, no recent weight changes Skin:no rashes or ulcers HEENT:no blurred vision, no congestion CV:see HPI PUL:see HPI GI:no diarrhea constipation or melena, no indigestion chronic nausea on meds, unable to eat except through PEG GU:no hematuria, no dysuria MS:no joint pain, no claudication, hx back pain Neuro:no syncope, no lightheadedness, + neuropathy Endo:no diabetes, no thyroid disease  Wt Readings from Last 3 Encounters:  06/29/16 155 lb 6.4 oz (70.5 kg)  06/29/16 156 lb 8 oz (71 kg)  06/28/16 151 lb 12.8 oz (68.9 kg)     PHYSICAL EXAM: VS:  BP 98/72   Pulse 78   Ht 6\' 1"  (1.854 m)   Wt 155 lb 6.4 oz (70.5 kg)   SpO2 98%   BMI 20.50 kg/m  , BMI Body mass index is 20.5 kg/m. General:Pleasant affect,  NAD Skin:Warm and dry, brisk capillary refill HEENT:normocephalic, sclera clear, mucus membranes moist Neck:supple, no JVD, no bruits  Heart:S1S2 RRR without murmur,+ S4  gallup, rub or click Lungs:clear without rales, rhonchi, or wheezes [id number], non tender, + BS, do not palpate liver spleen or masses, PEG in place Ext:no lower ext edema, 2+ pedal pulses, 2+ radial pulses Neuro:alert and oriented X 3, MAE, follows commands, + facial symmetry    EKG:  EKG is ordered  today. 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     Recent Labs: 04/27/2016: TSH 0.539 06/09/2016: ALT 17; BUN 16.6; Creatinine 0.9; HGB 13.3; Magnesium 2.7; Platelets 193; Potassium 4.8; Sodium 141    Lipid Panel    Component Value Date/Time   CHOL 185 09/30/2008 0038   TRIG 81 09/30/2008 0038   HDL 84 09/30/2008 0038   CHOLHDL 2.2 Ratio 09/30/2008 0038   VLDL 16 09/30/2008 0038   LDLCALC 85 09/30/2008 0038       Other studies Reviewed: Additional studies/ records that were reviewed today include: Echo stress 2 years ago was normal.   ASSESSMENT AND PLAN:  1.  Chest pain sounds muscular skeletal - but pt is anxious and concerned due to severity of pain.  Discussed with Dr. Burt Knack and will do stress echo.  He has had one in the past that was negative..  If negative will not have pt follow up.   2.  COPD  Stable  3. 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   Current medicines are reviewed with the patient today.  The patient Has no concerns regarding medicines.  The following changes have been made:  See above Labs/ tests ordered today include:see above  Disposition:   FU:  see above  Signed, Cecilie Kicks, NP  06/29/2016 2:35 PM    [facility] [place], [place], [place], [place] Phone: [phone]; Fax: [phone]

## 2016-06-30 ENCOUNTER — Ambulatory Visit (HOSPITAL_BASED_OUTPATIENT_CLINIC_OR_DEPARTMENT_OTHER): Payer: Medicaid Other

## 2016-06-30 ENCOUNTER — Other Ambulatory Visit (HOSPITAL_BASED_OUTPATIENT_CLINIC_OR_DEPARTMENT_OTHER): Payer: Medicaid Other

## 2016-06-30 ENCOUNTER — Ambulatory Visit
Admission: RE | Admit: 2016-06-30 | Discharge: 2016-06-30 | Disposition: A | Discharge status: Home / Self Care | Payer: Medicaid Other | Source: Ambulatory Visit | Attending: Radiation Oncology | Admitting: Radiation Oncology

## 2016-06-30 ENCOUNTER — Other Ambulatory Visit: Payer: Self-pay | Admitting: *Deleted

## 2016-06-30 VITALS — BP 98/67 | HR 59 | Temp 98.0°F | Resp 18

## 2016-06-30 DIAGNOSIS — Z510 Encounter for antineoplastic radiation therapy: Secondary | ICD-10-CM | POA: Diagnosis not present

## 2016-06-30 DIAGNOSIS — E860 Dehydration: Secondary | ICD-10-CM

## 2016-06-30 DIAGNOSIS — C090 Malignant neoplasm of tonsillar fossa: Secondary | ICD-10-CM | POA: Diagnosis not present

## 2016-06-30 LAB — CBC WITH DIFFERENTIAL/PLATELET
BASO%: 0 % (ref 0.0–2.0)
Basophils Absolute: 0 10*3/uL (ref 0.0–0.1)
EOS%: 1.5 % (ref 0.0–7.0)
Eosinophils Absolute: 0 10*3/uL (ref 0.0–0.5)
HCT: 35.4 % — ABNORMAL LOW (ref 38.4–49.9)
HGB: 12.4 g/dL — ABNORMAL LOW (ref 13.0–17.1)
LYMPH%: 19.1 % (ref 14.0–49.0)
MCH: 31.3 pg (ref 27.2–33.4)
MCHC: 35 g/dL (ref 32.0–36.0)
MCV: 89.4 fL (ref 79.3–98.0)
MONO#: 0.3 10*3/uL (ref 0.1–0.9)
MONO%: 13.2 % (ref 0.0–14.0)
NEUT#: 1.4 10*3/uL — ABNORMAL LOW (ref 1.5–6.5)
NEUT%: 66.2 % (ref 39.0–75.0)
Platelets: 114 10*3/uL — ABNORMAL LOW (ref 140–400)
RBC: 3.96 10*6/uL — AB (ref 4.20–5.82)
RDW: 12.9 % (ref 11.0–14.6)
WBC: 2 10*3/uL — ABNORMAL LOW (ref 4.0–10.3)
lymph#: 0.4 10*3/uL — ABNORMAL LOW (ref 0.9–3.3)

## 2016-06-30 LAB — COMPREHENSIVE METABOLIC PANEL
ALBUMIN: 3.6 g/dL (ref 3.5–5.0)
ALT: 18 U/L (ref 0–55)
ANION GAP: 8 meq/L (ref 3–11)
AST: 21 U/L (ref 5–34)
Alkaline Phosphatase: 81 U/L (ref 40–150)
BUN: 17.7 mg/dL (ref 7.0–26.0)
CHLORIDE: 103 meq/L (ref 98–109)
CO2: 28 meq/L (ref 22–29)
Calcium: 10 mg/dL (ref 8.4–10.4)
Creatinine: 0.8 mg/dL (ref 0.7–1.3)
GLUCOSE: 74 mg/dL (ref 70–140)
Potassium: 4.7 mEq/L (ref 3.5–5.1)
Sodium: 139 mEq/L (ref 136–145)
Total Protein: 7.7 g/dL (ref 6.4–8.3)

## 2016-06-30 LAB — MAGNESIUM: MAGNESIUM: 2 mg/dL (ref 1.5–2.5)

## 2016-06-30 MED ORDER — SODIUM CHLORIDE 0.9 % IV SOLN
Freq: Once | INTRAVENOUS | Status: AC
  Administered 2016-06-30: 10:00:00 via INTRAVENOUS

## 2016-06-30 MED ORDER — SODIUM CHLORIDE 0.9 % IJ SOLN
10.0000 mL | INTRAMUSCULAR | Status: DC | PRN
  Administered 2016-06-30: 10 mL
  Filled 2016-06-30: qty 10

## 2016-06-30 MED ORDER — PROMETHAZINE HCL 25 MG/ML IJ SOLN
12.5000 mg | Freq: Once | INTRAMUSCULAR | Status: AC
  Administered 2016-06-30: 12.5 mg via INTRAVENOUS
  Filled 2016-06-30: qty 1

## 2016-06-30 MED ORDER — HEPARIN SOD (PORK) LOCK FLUSH 100 UNIT/ML IV SOLN
500.0000 [IU] | Freq: Once | INTRAVENOUS | Status: AC | PRN
  Administered 2016-06-30: 500 [IU]
  Filled 2016-06-30: qty 5

## 2016-06-30 NOTE — Patient Instructions (Signed)
Dehydration, Adult Dehydration is a condition in which you do not have enough fluid or water in your body. It happens when you take in less fluid than you lose. Vital organs such as the kidneys, brain, and heart cannot function without a proper amount of fluids. Any loss of fluids from the body can cause dehydration.  Dehydration can range from mild to severe. This condition should be treated right away to help prevent it from becoming severe. CAUSES  This condition may be caused by:  Vomiting.  Diarrhea.  Excessive sweating, such as when exercising in hot or humid weather.  Not drinking enough fluid during strenuous exercise or during an illness.  Excessive urine output.  Fever.  Certain medicines. RISK FACTORS This condition is more likely to develop in:  People who are taking certain medicines that cause the body to lose excess fluid (diuretics).   People who have a chronic illness, such as diabetes, that may increase urination.  Older adults.   People who live at high altitudes.   People who participate in endurance sports.  SYMPTOMS  Mild Dehydration  Thirst.  Dry lips.  Slightly dry mouth.  Dry, warm skin. Moderate Dehydration  Very dry mouth.   Muscle cramps.   Dark urine and decreased urine production.   Decreased tear production.   Headache.   Light-headedness, especially when you stand up from a sitting position.  Severe Dehydration  Changes in skin.   Cold and clammy skin.   Skin does not spring back quickly when lightly pinched and released.   Changes in body fluids.   Extreme thirst.   No tears.   Not able to sweat when body temperature is high, such as in hot weather.   Minimal urine production.   Changes in vital signs.   Rapid, weak pulse (more than 100 beats per minute when you are sitting still).   Rapid breathing.   Low blood pressure.   Other changes.   Sunken eyes.   Cold hands and feet.    Confusion.  Lethargy and difficulty being awakened.  Fainting (syncope).   Short-term weight loss.   Unconsciousness. DIAGNOSIS  This condition may be diagnosed based on your symptoms. You may also have tests to determine how severe your dehydration is. These tests may include:   Urine tests.   Blood tests.  TREATMENT  Treatment for this condition depends on the severity. Mild or moderate dehydration can often be treated at home. Treatment should be started right away. Do not wait until dehydration becomes severe. Severe dehydration needs to be treated at the hospital. Treatment for Mild Dehydration  Drinking plenty of water to replace the fluid you have lost.   Replacing minerals in your blood (electrolytes) that you may have lost.  Treatment for Moderate Dehydration  Consuming oral rehydration solution (ORS). Treatment for Severe Dehydration  Receiving fluid through an IV tube.   Receiving electrolyte solution through a feeding tube that is passed through your nose and into your stomach (nasogastric tube or NG tube).  Correcting any abnormalities in electrolytes. HOME CARE INSTRUCTIONS   Drink enough fluid to keep your urine clear or pale yellow.   Drink water or fluid slowly by taking small sips. You can also try sucking on ice cubes.  Have food or beverages that contain electrolytes. Examples include bananas and sports drinks.  Take over-the-counter and prescription medicines only as told by your health care provider.   Prepare ORS according to the manufacturer's instructions. Take sips   of ORS every 5 minutes until your urine returns to normal.  If you have vomiting or diarrhea, continue to try to drink water, ORS, or both.   If you have diarrhea, avoid:   Beverages that contain caffeine.   Fruit juice.   Milk.   Carbonated soft drinks.  Do not take salt tablets. This can lead to the condition of having too much sodium in your body  (hypernatremia).  SEEK MEDICAL CARE IF:  You cannot eat or drink without vomiting.  You have had moderate diarrhea during a period of more than 24 hours.  You have a fever. SEEK IMMEDIATE MEDICAL CARE IF:   You have extreme thirst.  You have severe diarrhea.  You have not urinated in 6-8 hours, or you have urinated only a small amount of very dark urine.  You have shriveled skin.  You are dizzy, confused, or both.   This information is not intended to replace advice given to you by your health care provider. Make sure you discuss any questions you have with your health care provider.   Document Released: 08/21/2005 Document Revised: 05/12/2015 Document Reviewed: 01/06/2015 Elsevier Interactive Patient Education 2016 Elsevier Inc.   Nausea, Adult Nausea is the feeling that you have an upset stomach or have to vomit. Nausea by itself is not likely a serious concern, but it may be an early sign of more serious medical problems. As nausea gets worse, it can lead to vomiting. If vomiting develops, there is the risk of dehydration.  CAUSES   Viral infections.  Food poisoning.  Medicines.  Pregnancy.  Motion sickness.  Migraine headaches.  Emotional distress.  Severe pain from any source.  Alcohol intoxication. HOME CARE INSTRUCTIONS  Get plenty of rest.  Ask your caregiver about specific rehydration instructions.  Eat small amounts of food and sip liquids more often.  Take all medicines as told by your caregiver. SEEK MEDICAL CARE IF:  You have not improved after 2 days, or you get worse.  You have a headache. SEEK IMMEDIATE MEDICAL CARE IF:   You have a fever.  You faint.  You keep vomiting or have blood in your vomit.  You are extremely weak or dehydrated.  You have dark or bloody stools.  You have severe chest or abdominal pain. MAKE SURE YOU:  Understand these instructions.  Will watch your condition.  Will get help right away if you are  not doing well or get worse.   This information is not intended to replace advice given to you by your health care provider. Make sure you discuss any questions you have with your health care provider.   Document Released: 09/28/2004 Document Revised: 09/11/2014 Document Reviewed: 05/03/2011 Elsevier Interactive Patient Education 2016 Elsevier Inc.  

## 2016-07-01 ENCOUNTER — Ambulatory Visit (HOSPITAL_BASED_OUTPATIENT_CLINIC_OR_DEPARTMENT_OTHER): Payer: Medicaid Other

## 2016-07-01 VITALS — BP 111/70 | HR 63 | Temp 98.4°F | Resp 17

## 2016-07-01 DIAGNOSIS — C090 Malignant neoplasm of tonsillar fossa: Secondary | ICD-10-CM | POA: Diagnosis present

## 2016-07-01 MED ORDER — HEPARIN SOD (PORK) LOCK FLUSH 100 UNIT/ML IV SOLN
500.0000 [IU] | Freq: Once | INTRAVENOUS | Status: AC
  Administered 2016-07-01: 500 [IU] via INTRAVENOUS
  Filled 2016-07-01: qty 5

## 2016-07-01 MED ORDER — SODIUM CHLORIDE 0.9% FLUSH
10.0000 mL | INTRAVENOUS | Status: DC | PRN
  Administered 2016-07-01: 10 mL via INTRAVENOUS
  Filled 2016-07-01: qty 10

## 2016-07-01 MED ORDER — SODIUM CHLORIDE 0.9 % IV SOLN
Freq: Once | INTRAVENOUS | Status: AC
  Administered 2016-07-01: 08:00:00 via INTRAVENOUS

## 2016-07-01 NOTE — Patient Instructions (Signed)

## 2016-07-03 ENCOUNTER — Encounter: Payer: Self-pay | Admitting: Radiation Oncology

## 2016-07-03 ENCOUNTER — Encounter: Payer: Medicaid Other | Admitting: Nutrition

## 2016-07-03 ENCOUNTER — Ambulatory Visit (HOSPITAL_BASED_OUTPATIENT_CLINIC_OR_DEPARTMENT_OTHER): Payer: Medicaid Other

## 2016-07-03 ENCOUNTER — Encounter: Payer: Self-pay | Admitting: *Deleted

## 2016-07-03 ENCOUNTER — Ambulatory Visit
Admission: RE | Admit: 2016-07-03 | Discharge: 2016-07-03 | Disposition: A | Discharge status: Home / Self Care | Payer: Medicaid Other | Source: Ambulatory Visit | Attending: Radiation Oncology | Admitting: Radiation Oncology

## 2016-07-03 ENCOUNTER — Other Ambulatory Visit: Payer: Self-pay | Admitting: Hematology and Oncology

## 2016-07-03 VITALS — BP 99/75 | HR 79 | Temp 98.6°F | Resp 16 | Ht 73.0 in | Wt 149.8 lb

## 2016-07-03 VITALS — Temp 98.3°F | Ht 73.0 in | Wt 150.8 lb

## 2016-07-03 DIAGNOSIS — R070 Pain in throat: Secondary | ICD-10-CM

## 2016-07-03 DIAGNOSIS — Z510 Encounter for antineoplastic radiation therapy: Secondary | ICD-10-CM | POA: Diagnosis not present

## 2016-07-03 DIAGNOSIS — C090 Malignant neoplasm of tonsillar fossa: Secondary | ICD-10-CM

## 2016-07-03 DIAGNOSIS — Z5111 Encounter for antineoplastic chemotherapy: Secondary | ICD-10-CM | POA: Diagnosis present

## 2016-07-03 DIAGNOSIS — Z23 Encounter for immunization: Secondary | ICD-10-CM

## 2016-07-03 MED ORDER — FAMOTIDINE IN NACL 20-0.9 MG/50ML-% IV SOLN
INTRAVENOUS | Status: AC
  Filled 2016-07-03: qty 50

## 2016-07-03 MED ORDER — DIPHENHYDRAMINE HCL 50 MG/ML IJ SOLN
INTRAMUSCULAR | Status: AC
  Filled 2016-07-03: qty 1

## 2016-07-03 MED ORDER — SONAFINE EX EMUL
1.0000 "application " | Freq: Once | CUTANEOUS | Status: AC
  Administered 2016-07-03: 1 via TOPICAL

## 2016-07-03 MED ORDER — PROCHLORPERAZINE MALEATE 10 MG PO TABS: 10.0000 mg | ORAL_TABLET | Freq: Four times a day (QID) | ORAL | 3 refills | Status: DC | PRN

## 2016-07-03 MED ORDER — CISPLATIN CHEMO INJECTION 100MG/100ML
100.0000 mg/m2 | Freq: Once | INTRAVENOUS | Status: AC
  Administered 2016-07-03: 192 mg via INTRAVENOUS
  Filled 2016-07-03: qty 192

## 2016-07-03 MED ORDER — FAMOTIDINE IN NACL 20-0.9 MG/50ML-% IV SOLN
20.0000 mg | Freq: Once | INTRAVENOUS | Status: AC
  Administered 2016-07-03: 20 mg via INTRAVENOUS

## 2016-07-03 MED ORDER — SODIUM CHLORIDE 0.9 % IV SOLN
Freq: Once | INTRAVENOUS | Status: AC
  Administered 2016-07-03: 13:00:00 via INTRAVENOUS
  Filled 2016-07-03: qty 5

## 2016-07-03 MED ORDER — PALONOSETRON HCL INJECTION 0.25 MG/5ML
INTRAVENOUS | Status: AC
  Filled 2016-07-03: qty 5

## 2016-07-03 MED ORDER — SODIUM CHLORIDE 0.9% FLUSH
10.0000 mL | INTRAVENOUS | Status: DC | PRN
  Administered 2016-07-03: 10 mL
  Filled 2016-07-03: qty 10

## 2016-07-03 MED ORDER — POTASSIUM CHLORIDE 2 MEQ/ML IV SOLN
Freq: Once | INTRAVENOUS | Status: AC
  Administered 2016-07-03: 10:00:00 via INTRAVENOUS
  Filled 2016-07-03: qty 10

## 2016-07-03 MED ORDER — HEPARIN SOD (PORK) LOCK FLUSH 100 UNIT/ML IV SOLN
500.0000 [IU] | Freq: Once | INTRAVENOUS | Status: AC | PRN
  Administered 2016-07-03: 500 [IU]
  Filled 2016-07-03: qty 5

## 2016-07-03 MED ORDER — DIPHENHYDRAMINE HCL 50 MG/ML IJ SOLN
25.0000 mg | Freq: Once | INTRAMUSCULAR | Status: AC
  Administered 2016-07-03: 25 mg via INTRAVENOUS

## 2016-07-03 MED ORDER — PALONOSETRON HCL INJECTION 0.25 MG/5ML
0.2500 mg | Freq: Once | INTRAVENOUS | Status: AC
  Administered 2016-07-03: 0.25 mg via INTRAVENOUS

## 2016-07-03 MED FILL — PROCHLORPERAZINE 10 MG TAB: 10 | 22 days supply | Qty: 90 | Fill #0

## 2016-07-03 NOTE — Patient Instructions (Signed)
[facility] Discharge Instructions for Patients Receiving Chemotherapy  Today you received the following chemotherapy agents Cisplatin   To help prevent nausea and vomiting after your treatment, we encourage you to take your nausea medication as directed. No Zofran for 3 days. Take Compazine instead.   If you develop nausea and vomiting that is not controlled by your nausea medication, call the clinic.   BELOW ARE SYMPTOMS THAT SHOULD BE REPORTED IMMEDIATELY:  *FEVER GREATER THAN 100.5 F  *CHILLS WITH OR WITHOUT FEVER  NAUSEA AND VOMITING THAT IS NOT CONTROLLED WITH YOUR NAUSEA MEDICATION  *UNUSUAL SHORTNESS OF BREATH  *UNUSUAL BRUISING OR BLEEDING  TENDERNESS IN MOUTH AND THROAT WITH OR WITHOUT PRESENCE OF ULCERS  *URINARY PROBLEMS  *BOWEL PROBLEMS  UNUSUAL RASH Items with * indicate a potential emergency and should be followed up as soon as possible.  Feel free to call the clinic you have any questions or concerns. The clinic phone number is [phone].  Please show the [facility] at check-in to the Emergency Department and triage nurse.

## 2016-07-03 NOTE — Addendum Note (Signed)
Encounter addended by: Ernst Spell, RN on: 07/03/2016 12:16 PM<BR>    Actions taken: MAR administration accepted

## 2016-07-03 NOTE — Progress Notes (Signed)
   Weekly Management Note:  Outpatient    ICD-9-CM ICD-10-CM   1. 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HCC) 146.1 C09.0     Current Dose:  60 Gy  Projected Dose: 70 Gy    Narrative:  The patient presents for routine under treatment assessment.  CBCT/MVCT images/Port film x-rays were reviewed.  The chart was checked.   Feels Depressed currently but not thoughts of harm to self or others.   Relates this to diagnosis, tx, and issues at home.  Open to seeing Education officer, museum.   Presents for his 30th fraction of radiation to his Left Tonsil/ Bilateral Neck. He denies pain.    He plans to try the Fentanyl patch today, and he is aware of the morphine solution available to him.    He will receive his 3rd and last Chemotherapy today. He reports that he is receiving IV Fluids Monday through Saturday in Medical Oncology. He is instilling 6 cans of Osmolite daily through his PEG tube. He is instilling about 1000 ml of water daily through his PEG tube. He is drinking very little orally, and taking in no solids foods. He has some nausea and will take compazine and zofran for relief.   Temp 98.3 F (36.8 C)   Ht 6\' 1"  (1.854 m)   Wt 150 lb 12.8 oz (68.4 kg)   SpO2 100% Comment: room air  BMI 19.90 kg/m    Orthostatics: BP sitting 96/76 pulse 95. BP standing 94/69 pulse 107    Physical Findings:  Wt Readings from Last 3 Encounters:  07/03/16 150 lb 12.8 oz (68.4 kg)  06/29/16 155 lb 6.4 oz (70.5 kg)  06/29/16 156 lb 8 oz (71 kg)    height is 6\' 1"  (1.854 m) and weight is 150 lb 12.8 oz (68.4 kg). His temperature is 98.3 F (36.8 C). His oxygen saturation is 100%.  NAD,no tumor appreciated in left tonsil.  Confluent mucositis over oropharynx.  Skin hyperpigmented with superficial dry peeling over neck  CBC    Component Value Date/Time   WBC 2.0 (L) 06/30/2016 0914   WBC 9.1 05/17/2016 1250   RBC 3.96 (L) 06/30/2016 0914   RBC 4.78 05/17/2016 1250   HGB 12.4 (L) 06/30/2016 0914   HCT 35.4  (L) 06/30/2016 0914   PLT 114 (L) 06/30/2016 0914   MCV 89.4 06/30/2016 0914   MCH 31.3 06/30/2016 0914   MCH 31.8 05/17/2016 1250   MCHC 35.0 06/30/2016 0914   MCHC 35.1 05/17/2016 1250   RDW 12.9 06/30/2016 0914   LYMPHSABS 0.4 (L) 06/30/2016 0914   MONOABS 0.3 06/30/2016 0914   EOSABS 0.0 06/30/2016 0914   BASOSABS 0.0 06/30/2016 0914     CMP     Component Value Date/Time   NA 139 06/30/2016 0914   K 4.7 06/30/2016 0914   CL 105 05/17/2016 1250   CO2 28 06/30/2016 0914   GLUCOSE 74 06/30/2016 0914   BUN 17.7 06/30/2016 0914   CREATININE 0.8 06/30/2016 0914   CALCIUM 10.0 06/30/2016 0914   PROT 7.7 06/30/2016 0914   ALBUMIN 3.6 06/30/2016 0914   AST 21 06/30/2016 0914   ALT 18 06/30/2016 0914   ALKPHOS 81 06/30/2016 0914   BILITOT <0.22 06/30/2016 0914   GFRNONAA >60 05/17/2016 1250   GFRAA >60 05/17/2016 1250     Impression:  The patient is tolerating radiotherapy.   Plan:  Continue radiotherapy as planned.  Chemotherapy today   Eppie Gibson, MD

## 2016-07-03 NOTE — Progress Notes (Signed)
Oncology Nurse Navigator Documentation  Met with Mr. Compston in Infusion to check on his well-being.  He was receiving scheduled Cisplatin.   He reported increasing throat soreness, is going to start Fentanyl today.  "I've been holding off."  I encouraged him to use pain medications as prescribed. He denied any needs/concerns, knows to call me.  Gayleen Orem, RN, BSN, [place] at [place] [phone]

## 2016-07-03 NOTE — Progress Notes (Signed)
Ok to treat today with ANC 1.4 per Cameo RN, per MD Alvy Bimler.

## 2016-07-03 NOTE — Addendum Note (Signed)
Encounter addended by: Ernst Spell, RN on: 07/03/2016 11:50 AM<BR>    Actions taken: Order Entry activity accessed, Diagnosis association updated

## 2016-07-03 NOTE — Progress Notes (Signed)
[name] [name] presents for his 30th fraction of radiation to his Left Tonsil/ Bilateral Neck. He denies pain. He does report intense "itching" in his throat. He plans to try the Fentanyl patch today, and he is aware of the morphine solution available to him. He reports feeling depressed. He will receive his 3rd and last Chemotherapy today. He reports that he is receiving IV Fluids Monday through Saturday in Medical Oncology. He is instilling 6 cans of Osmolite daily through his PEG tube. He is instilling about 1000 ml of water daily through his PEG tube. He is drinking very little orally, and taking in no solids foods. He has some nausea and will take compazine and zofran for relief. He reports needing a refill for his compazine today. His neck is hyperpigmented with peeling noted to his upper neck bilaterally. He is using sonafine cream to this area and was provided with another tube today. He knows to use neosporin to the peeling areas.  Temp 98.3 F (36.8 C)   Ht 6\' 1"  (1.854 m)   Wt 150 lb 12.8 oz (68.4 kg)   SpO2 100% Comment: room air  BMI 19.90 kg/m    Orthostatics: BP sitting 96/76 pulse 95. BP standing 94/69 pulse 107  Wt Readings from Last 3 Encounters:  07/03/16 150 lb 12.8 oz (68.4 kg)  06/29/16 155 lb 6.4 oz (70.5 kg)  06/29/16 156 lb 8 oz (71 kg)

## 2016-07-03 NOTE — Progress Notes (Signed)
Nutrition follow-up: Patient seen during chemotherapy for tonsil cancer. Pt continues to require daily IV fluids. Weight decreased and documented as 150 lb 12.8 oz today, down from 151.8 pounds on 06/28/2016.   Note patient's normal weight is approximately 140-160 pounds.   Patient reports that he has not been able to take anything orally not even water especially over the last 3 days.  Reports takes few sips of water with medication and that has been about all.   He reports that he is taking 6 cans of osmolite 1.5 daily which is less than his goal of 7 cans.  Pt reports that flushes tube with 28ml of water before and after feeding. States that he has nothing to do during the day except watch TV and give nutrition via tube.   Patient reports having regular sometimes loose bowel movement 1 time per day after starting miralax.    Estimated nutrition needs: 2460-2660 calories, 95-110 grams of protein, 2.6 L fluid  Nutrition diagnosis: Inadequate oral intake continues.  Intervention: Educated pt on increasing bolus feeding to 2 cans every 4 hours 3 times per day plus one can once a day.  Will need to continue free water flush of 58ml of water before and after each can ( 7 times per day).  Since pt unable to take 16 oz water via mouth at this time discussed additional free water bolus of 240ml BID or 120 QID.  Handwritten schedule written for patient. Discussed he does sometimes gets full feeling with giving 2 cans at a time.  Discussed strategies to reduce full feeling.  Teachback method used.   Tube feeding plus free water provides 2489 calories, 104 g of protein, and 2535ml free water.   Pt reports he has enough tube feeding and supplies.   Monitoring, evaluation, goals: Patient will work to tolerate 7 cans of osmolite 1.5 daily to meet greater than 90% of estimated energy needs.  Next visit: Monday, November 6   Xana Bradt B. Zenia Resides, [place], [place] (pager) Weekend/On-Call pager  [phone])

## 2016-07-04 ENCOUNTER — Ambulatory Visit (HOSPITAL_BASED_OUTPATIENT_CLINIC_OR_DEPARTMENT_OTHER): Payer: Medicaid Other

## 2016-07-04 ENCOUNTER — Telehealth: Payer: Self-pay | Admitting: Hematology and Oncology

## 2016-07-04 ENCOUNTER — Ambulatory Visit
Admission: RE | Admit: 2016-07-04 | Discharge: 2016-07-04 | Disposition: A | Discharge status: Home / Self Care | Payer: Medicaid Other | Source: Ambulatory Visit | Attending: Radiation Oncology | Admitting: Radiation Oncology

## 2016-07-04 ENCOUNTER — Encounter: Payer: Self-pay | Admitting: Hematology and Oncology

## 2016-07-04 ENCOUNTER — Ambulatory Visit (HOSPITAL_BASED_OUTPATIENT_CLINIC_OR_DEPARTMENT_OTHER): Payer: Medicaid Other | Admitting: Hematology and Oncology

## 2016-07-04 VITALS — BP 122/78 | HR 56 | Temp 98.1°F | Resp 18

## 2016-07-04 DIAGNOSIS — C090 Malignant neoplasm of tonsillar fossa: Secondary | ICD-10-CM

## 2016-07-04 DIAGNOSIS — H9313 Tinnitus, bilateral: Secondary | ICD-10-CM | POA: Diagnosis not present

## 2016-07-04 DIAGNOSIS — R634 Abnormal weight loss: Secondary | ICD-10-CM | POA: Diagnosis not present

## 2016-07-04 DIAGNOSIS — Z510 Encounter for antineoplastic radiation therapy: Secondary | ICD-10-CM | POA: Diagnosis not present

## 2016-07-04 DIAGNOSIS — D61818 Other pancytopenia: Secondary | ICD-10-CM | POA: Diagnosis not present

## 2016-07-04 DIAGNOSIS — G893 Neoplasm related pain (acute) (chronic): Secondary | ICD-10-CM | POA: Diagnosis not present

## 2016-07-04 DIAGNOSIS — Z931 Gastrostomy status: Secondary | ICD-10-CM

## 2016-07-04 DIAGNOSIS — T451X5A Adverse effect of antineoplastic and immunosuppressive drugs, initial encounter: Secondary | ICD-10-CM

## 2016-07-04 DIAGNOSIS — R110 Nausea: Secondary | ICD-10-CM | POA: Diagnosis not present

## 2016-07-04 DIAGNOSIS — L589 Radiodermatitis, unspecified: Secondary | ICD-10-CM | POA: Insufficient documentation

## 2016-07-04 MED ORDER — SODIUM CHLORIDE 0.9 % IV SOLN
Freq: Once | INTRAVENOUS | Status: AC
  Administered 2016-07-04: 10:00:00 via INTRAVENOUS

## 2016-07-04 MED ORDER — HEPARIN SOD (PORK) LOCK FLUSH 100 UNIT/ML IV SOLN
500.0000 [IU] | Freq: Once | INTRAVENOUS | Status: AC | PRN
  Administered 2016-07-04: 500 [IU]
  Filled 2016-07-04: qty 5

## 2016-07-04 MED ORDER — SODIUM CHLORIDE 0.9 % IJ SOLN
10.0000 mL | INTRAMUSCULAR | Status: DC | PRN
  Administered 2016-07-04: 10 mL
  Filled 2016-07-04: qty 10

## 2016-07-04 MED ORDER — HYDROMORPHONE HCL 4 MG/ML IJ SOLN
1.0000 mg | Freq: Once | INTRAMUSCULAR | Status: AC
  Administered 2016-07-04: 1 mg via INTRAVENOUS

## 2016-07-04 MED ORDER — HYDROMORPHONE HCL 4 MG/ML IJ SOLN
INTRAMUSCULAR | Status: AC
  Filled 2016-07-04: qty 1

## 2016-07-04 MED ORDER — PROMETHAZINE HCL 25 MG/ML IJ SOLN
12.5000 mg | Freq: Once | INTRAMUSCULAR | Status: AC
  Administered 2016-07-04: 12.5 mg via INTRAVENOUS
  Filled 2016-07-04: qty 1

## 2016-07-04 NOTE — Assessment & Plan Note (Signed)
This is an expected side effects from cisplatin. I will observe only.

## 2016-07-04 NOTE — Assessment & Plan Note (Signed)
The feeding tube site looks clean without signs of infection.

## 2016-07-04 NOTE — Assessment & Plan Note (Signed)
The patient felt much better with IV fluid resuscitation and IV anti-emetics We discussed potential use of daily IV fluids for additional hydration support and he agreed to proceed.

## 2016-07-04 NOTE — Telephone Encounter (Signed)
Appointments scheduled per 10/31 LOS. Patient left before given AVS report and calendars with future scheduled appointments. Patient will pick up print outs in infusion.

## 2016-07-04 NOTE — Assessment & Plan Note (Signed)
He has some worsening mucositis, expected side effects of treatment. He has started taking fentanyl patch with better control of pain I recommend he takes liquid morphine sulfate as needed for pain

## 2016-07-04 NOTE — Progress Notes (Signed)
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 Chronic small-vessel ischemic changes of the cerebral hemispheric white matter.       04/04/2016 Imaging    CT neck showed left tonsillar mass with diameter of 19 x 24 mm consistent with 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 necrosis. Suspicious level 5/supraclavicular nodes on the left. Scar type density in the right upper lobe is more prominent than was seen on a CT scan of the chest 10/06/2014      04/21/2016 PET scan    Asymmetric hypermetabolic soft tissue prominence in left palatine tonsil, consistent with known primary left tonsillar squamous cell Carcinoma. Left level 2 cervical lymphadenopathy, consistent with metastatic disease. No evidence of metastatic disease within the chest, abdomen, or pelvis. Stable 14 mm ground-glass and part solid nodular opacity in right lung apex compared to previous CT in 2016. This shows low-grade metabolic activity. Low-grade bronchogenic adenocarcinoma cannot be excluded. Consider surgical resection versus continued followup by CT in 12 months      05/17/2016 Procedure    Successful placement of a 20 French pull  through gastrostomy tube & right IJ approach [facility].      05/22/2016 - 07/03/2016 Chemotherapy    He received 3 doses of high dose cisplatin with radiation       05/23/2016 -  Radiation Therapy    He received concurrent radiation therapy       INTERVAL HISTORY: Please see below for problem oriented charting. He returns after cycle 3 of treatment. He tolerated treatment well but has mild worsening mucositis pain. He started using fentanyl patch last night He has not been using liquid morphine for breakthrough pain on a regular basis. His rated his pain at 8 out of 10 His nausea is better controlled. No recent vomiting. He had minor constipation, resolved with laxatives Denies worsening hearing loss or tinnitus Denies neuropathy  REVIEW OF SYSTEMS:   Constitutional: Denies fevers, chills Eyes: Denies blurriness of vision Respiratory: Denies cough, dyspnea or wheezes Cardiovascular: Denies palpitation, chest discomfort or lower extremity swelling Skin: Denies abnormal skin rashes Lymphatics: Denies new lymphadenopathy or easy bruising Neurological:Denies numbness, tingling or new weaknesses Behavioral/Psych: Mood is stable, no new changes  All other systems were reviewed with the patient and are negative.  I have reviewed the past medical history, past surgical history, social history and family history with the patient and they are unchanged from previous note.  ALLERGIES:  has No Known Allergies.  MEDICATIONS:  Current Outpatient Prescriptions  Medication Sig Dispense Refill  . Ethyl Alcohol, Skin Cleanser, (PURELL INSTANT HAND SANITIZER) 65 % FOAM Use as needed to sanitize hands 160 mL 5  . fentaNYL (DURAGESIC - DOSED MCG/HR) 25 MCG/HR patch Place  1 patch (25 mcg total) onto the skin every 3 (three) days. 5 patch 0  . lidocaine (XYLOCAINE) 2 % solution Patient: Mix 1part 2% viscous lidocaine, 1part H20. Swallow 81mL of this mixture, 25min before meals and at bedtime, up  to QID PRN soreness 100 mL 5  . lidocaine-prilocaine (EMLA) cream Apply to affected area once 30 g 3  . LORazepam (ATIVAN) 0.5 MG tablet Take 1 tablet up to BID PRN nausea or anxiety.  Can be taken 30 min before radiotherapy for anxiety. 50 tablet 0  . Morphine Sulfate (MORPHINE CONCENTRATE) 10 mg / 0.5 ml concentrated solution Take 0.5 mLs (10 mg total) by mouth every 2 (two) hours as needed for severe pain. 240 mL 0  . Nutritional Supplements (FEEDING SUPPLEMENT, OSMOLITE 1.5 CAL,) LIQD Give one can Osmolite 1.5 - 7 times daily, every 2 hours with 60 cc free water before and after each feeding. 7 Bottle 0  . ondansetron (ZOFRAN) 8 MG tablet Take 1 tablet (8 mg total) by mouth every 8 (eight) hours as needed. Start on the third day after chemotherapy. 60 tablet 1  . polyethylene glycol (MIRALAX) packet Take 17 g by mouth daily. 14 each 9  . prochlorperazine (COMPAZINE) 10 MG tablet Take 1 tablet (10 mg total) by mouth every 6 (six) hours as needed (Nausea or vomiting). 90 tablet 3  . sodium fluoride (FLUORISHIELD) 1.1 % GEL dental gel Instill one drop of gel per tooth space of fluoride tray. Place over teeth for 5 minutes. Remove. Spit out excess. Repeat nightly. 120 mL prn  . sucralfate (CARAFATE) 1 g tablet Dissolve 1 tablet in 10 mL H20 and swallow up to QID PRN sore throat 40 tablet 5   No current facility-administered medications for this visit.    Facility-Administered Medications Ordered in Other Visits  Medication Dose Route Frequency Provider Last Rate Last Dose  . 0.9 %  sodium chloride infusion   Intravenous Once Heath Lark, MD      . heparin lock flush 100 unit/mL  500 Units Intracatheter Once PRN Heath Lark, MD      . HYDROmorphone (DILAUDID) injection 1 mg  1 mg Intravenous Q2H PRN Heath Lark, MD      . promethazine (PHENERGAN) injection 12.5 mg  12.5 mg Intravenous Once Heath Lark, MD      . sodium chloride 0.9 % injection 10 mL  10 mL Intracatheter PRN Heath Lark, MD      .  sodium chloride flush (NS) 0.9 % injection 10 mL  10 mL Intracatheter PRN Heath Lark, MD   10 mL at 05/24/16 1615    PHYSICAL EXAMINATION: ECOG PERFORMANCE STATUS: 1 - Symptomatic but completely ambulatory  Vitals:   07/04/16 0910  BP: 125/84  Pulse: (!) 59  Resp: 18  Temp: 98.1 F (36.7 C)   Filed Weights   07/04/16 0910  Weight: 152 lb 9.6 oz (69.2 kg)    GENERAL:alert, no distress and comfortable. He looks thin and cachectic SKIN: Noted radiation-induced skin dermatitis around his neck. No ulceration EYES: normal, Conjunctiva are pink and non-injected, sclera clear OROPHARYNX: Noted mucositis. No thrush NECK: supple, thyroid normal size, non-tender, without nodularity LYMPH:  no palpable lymphadenopathy in the cervical, axillary or inguinal LUNGS: clear to auscultation and percussion with normal breathing effort HEART: regular rate & rhythm and no murmurs and no lower extremity edema ABDOMEN:abdomen soft, non-tender and normal bowel sounds. Feeding tube site looks okay Musculoskeletal:no cyanosis of digits and no clubbing  NEURO: alert & oriented x 3 with fluent speech, no focal motor/sensory deficits  LABORATORY DATA:  I have reviewed the data as listed    Component Value Date/Time   NA 139 06/30/2016 0914   K 4.7 06/30/2016 0914   CL 105 05/17/2016 1250   CO2 28 06/30/2016 0914   GLUCOSE 74 06/30/2016 0914   BUN 17.7 06/30/2016 0914   CREATININE 0.8 06/30/2016 0914   CALCIUM 10.0 06/30/2016 0914   PROT 7.7 06/30/2016 0914   ALBUMIN 3.6 06/30/2016 0914   AST 21 06/30/2016 0914   ALT 18 06/30/2016 0914   ALKPHOS 81 06/30/2016 0914   BILITOT <0.22 06/30/2016 0914   GFRNONAA >60 05/17/2016 1250   GFRAA >60 05/17/2016 1250    No results found for: SPEP, UPEP  Lab Results  Component Value Date   WBC 2.0 (L) 06/30/2016   NEUTROABS 1.4 (L) 06/30/2016   HGB 12.4 (L) 06/30/2016   HCT 35.4 (L) 06/30/2016   MCV 89.4 06/30/2016   PLT 114 (L) 06/30/2016       Chemistry      Component Value Date/Time   NA 139 06/30/2016 0914   K 4.7 06/30/2016 0914   CL 105 05/17/2016 1250   CO2 28 06/30/2016 0914   BUN 17.7 06/30/2016 0914   CREATININE 0.8 06/30/2016 0914      Component Value Date/Time   CALCIUM 10.0 06/30/2016 0914   ALKPHOS 81 06/30/2016 0914   AST 21 06/30/2016 0914   ALT 18 06/30/2016 0914   BILITOT <0.22 06/30/2016 0914     ASSESSMENT & PLAN:  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HCC) He tolerated treatment well with worsening expected side effects. He will continue to be seen on a weekly basis for supportive care  Cancer associated pain He has some worsening mucositis, expected side effects of treatment. He has started taking fentanyl patch with better control of pain I recommend he takes liquid morphine sulfate as needed for pain  Chemotherapy-induced nausea The patient felt much better with IV fluid resuscitation and IV anti-emetics We discussed potential use of daily IV fluids for additional hydration support and he agreed to proceed.  S/P gastrostomy ([place]) The feeding tube site looks clean without signs of infection.  Pancytopenia, acquired [facility]) He had completed recent chemotherapy. He is not symptomatic from pancytopenia. Recommend close observation  Bilateral tinnitus This is an expected side effects from cisplatin. I will observe only.  Weight loss He has progressive recent weight loss related to nausea. 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 No skin ulceration. Continue barrier cream   No orders of the defined types were placed in this encounter.  All questions were answered. The patient knows to call the clinic with any problems, questions or concerns. No barriers to learning was detected. I spent 25 minutes counseling the patient face to face. The total time  spent in the appointment was 40 minutes and more than 50% was on counseling and review of test results     Heath Lark, MD 07/04/2016 9:50 AM

## 2016-07-04 NOTE — Assessment & Plan Note (Signed)
This is minor. No skin ulceration. Continue barrier cream

## 2016-07-04 NOTE — Assessment & Plan Note (Signed)
He has progressive recent weight loss related to nausea. I recommend him to start using the feeding tube and increase dietary supplement in between meals He will continue regular visits with dietitian

## 2016-07-04 NOTE — Assessment & Plan Note (Signed)
He tolerated treatment well with worsening expected side effects. He will continue to be seen on a weekly basis for supportive care

## 2016-07-04 NOTE — Assessment & Plan Note (Signed)
He had completed recent chemotherapy. He is not symptomatic from pancytopenia. Recommend close observation

## 2016-07-04 NOTE — Patient Instructions (Signed)

## 2016-07-05 ENCOUNTER — Ambulatory Visit
Admission: RE | Admit: 2016-07-05 | Discharge: 2016-07-05 | Disposition: A | Discharge status: Home / Self Care | Payer: Medicaid Other | Source: Ambulatory Visit | Attending: Radiation Oncology | Admitting: Radiation Oncology

## 2016-07-05 ENCOUNTER — Ambulatory Visit (HOSPITAL_BASED_OUTPATIENT_CLINIC_OR_DEPARTMENT_OTHER): Payer: No Typology Code available for payment source

## 2016-07-05 ENCOUNTER — Other Ambulatory Visit: Payer: Self-pay | Admitting: Hematology and Oncology

## 2016-07-05 VITALS — BP 98/64 | HR 53 | Temp 98.6°F | Resp 18

## 2016-07-05 DIAGNOSIS — Z510 Encounter for antineoplastic radiation therapy: Secondary | ICD-10-CM | POA: Diagnosis not present

## 2016-07-05 DIAGNOSIS — R110 Nausea: Secondary | ICD-10-CM

## 2016-07-05 DIAGNOSIS — C090 Malignant neoplasm of tonsillar fossa: Secondary | ICD-10-CM

## 2016-07-05 MED ORDER — DEXAMETHASONE SODIUM PHOSPHATE 10 MG/ML IJ SOLN
10.0000 mg | Freq: Once | INTRAMUSCULAR | Status: AC
  Administered 2016-07-05: 10 mg via INTRAVENOUS

## 2016-07-05 MED ORDER — HEPARIN SOD (PORK) LOCK FLUSH 100 UNIT/ML IV SOLN
500.0000 [IU] | Freq: Once | INTRAVENOUS | Status: AC | PRN
  Administered 2016-07-05: 500 [IU]
  Filled 2016-07-05: qty 5

## 2016-07-05 MED ORDER — SODIUM CHLORIDE 0.9 % IJ SOLN
10.0000 mL | INTRAMUSCULAR | Status: DC | PRN
  Administered 2016-07-05: 10 mL
  Filled 2016-07-05: qty 10

## 2016-07-05 MED ORDER — SODIUM CHLORIDE 0.9 % IV SOLN
Freq: Once | INTRAVENOUS | Status: AC
  Administered 2016-07-05: 09:00:00 via INTRAVENOUS

## 2016-07-05 MED ORDER — DEXAMETHASONE SODIUM PHOSPHATE 10 MG/ML IJ SOLN
INTRAMUSCULAR | Status: AC
  Filled 2016-07-05: qty 1

## 2016-07-05 NOTE — Patient Instructions (Signed)

## 2016-07-05 NOTE — Progress Notes (Signed)
Pt states he does not want phenergan due to it making him sleepy. He reports that he took his compazine around 5 am today (07/05/16). Cameo aware and pt received Decadron as ordered. Pt reports no pain and no need for pain medication today. Pt stable at discharge.

## 2016-07-06 ENCOUNTER — Ambulatory Visit
Admission: RE | Admit: 2016-07-06 | Discharge: 2016-07-06 | Disposition: A | Discharge status: Home / Self Care | Payer: Medicaid Other | Source: Ambulatory Visit | Attending: Radiation Oncology | Admitting: Radiation Oncology

## 2016-07-06 ENCOUNTER — Ambulatory Visit (HOSPITAL_BASED_OUTPATIENT_CLINIC_OR_DEPARTMENT_OTHER): Payer: No Typology Code available for payment source

## 2016-07-06 ENCOUNTER — Other Ambulatory Visit: Payer: Self-pay | Admitting: Hematology and Oncology

## 2016-07-06 VITALS — BP 111/75 | HR 60 | Temp 98.6°F | Resp 16

## 2016-07-06 DIAGNOSIS — R110 Nausea: Secondary | ICD-10-CM

## 2016-07-06 DIAGNOSIS — Z510 Encounter for antineoplastic radiation therapy: Secondary | ICD-10-CM | POA: Diagnosis not present

## 2016-07-06 DIAGNOSIS — C090 Malignant neoplasm of tonsillar fossa: Secondary | ICD-10-CM

## 2016-07-06 MED ORDER — SODIUM CHLORIDE 0.9 % IJ SOLN
10.0000 mL | INTRAMUSCULAR | Status: DC | PRN
  Administered 2016-07-06: 10 mL
  Filled 2016-07-06: qty 10

## 2016-07-06 MED ORDER — ONDANSETRON HCL 4 MG/2ML IJ SOLN
8.0000 mg | Freq: Once | INTRAMUSCULAR | Status: AC
  Administered 2016-07-06: 8 mg via INTRAVENOUS

## 2016-07-06 MED ORDER — SODIUM CHLORIDE 0.9 % IV SOLN
Freq: Once | INTRAVENOUS | Status: AC
  Administered 2016-07-06: 09:00:00 via INTRAVENOUS

## 2016-07-06 MED ORDER — ONDANSETRON HCL 4 MG/2ML IJ SOLN
INTRAMUSCULAR | Status: AC
  Filled 2016-07-06: qty 2

## 2016-07-06 MED ORDER — HEPARIN SOD (PORK) LOCK FLUSH 100 UNIT/ML IV SOLN
500.0000 [IU] | Freq: Once | INTRAVENOUS | Status: AC | PRN
  Administered 2016-07-06: 500 [IU]
  Filled 2016-07-06: qty 5

## 2016-07-06 MED ORDER — SODIUM CHLORIDE 0.9 % IV SOLN
Freq: Once | INTRAVENOUS | Status: DC
  Filled 2016-07-06: qty 4

## 2016-07-06 NOTE — Patient Instructions (Signed)

## 2016-07-07 ENCOUNTER — Ambulatory Visit: Payer: Medicaid Other

## 2016-07-07 ENCOUNTER — Ambulatory Visit
Admission: RE | Admit: 2016-07-07 | Discharge: 2016-07-07 | Disposition: A | Discharge status: Home / Self Care | Payer: Medicaid Other | Source: Ambulatory Visit | Attending: Radiation Oncology | Admitting: Radiation Oncology

## 2016-07-07 ENCOUNTER — Ambulatory Visit (HOSPITAL_BASED_OUTPATIENT_CLINIC_OR_DEPARTMENT_OTHER): Payer: No Typology Code available for payment source

## 2016-07-07 VITALS — BP 126/89 | HR 61 | Temp 99.0°F | Resp 17

## 2016-07-07 DIAGNOSIS — Z510 Encounter for antineoplastic radiation therapy: Secondary | ICD-10-CM | POA: Diagnosis not present

## 2016-07-07 DIAGNOSIS — R110 Nausea: Secondary | ICD-10-CM

## 2016-07-07 DIAGNOSIS — C090 Malignant neoplasm of tonsillar fossa: Secondary | ICD-10-CM

## 2016-07-07 MED ORDER — SODIUM CHLORIDE 0.9 % IV SOLN
Freq: Once | INTRAVENOUS | Status: AC
  Administered 2016-07-07: 10:00:00 via INTRAVENOUS

## 2016-07-07 MED ORDER — HYDROMORPHONE HCL 4 MG/ML IJ SOLN
INTRAMUSCULAR | Status: AC
  Filled 2016-07-07: qty 1

## 2016-07-07 MED ORDER — SODIUM CHLORIDE 0.9 % IV SOLN
Freq: Once | INTRAVENOUS | Status: AC
  Administered 2016-07-07: 12:00:00 via INTRAVENOUS

## 2016-07-07 MED ORDER — ONDANSETRON HCL 4 MG/2ML IJ SOLN: 8.0000 mg | Freq: Once | INTRAMUSCULAR | Status: DC

## 2016-07-07 MED ORDER — ONDANSETRON HCL 4 MG/2ML IJ SOLN
8.0000 mg | Freq: Once | INTRAMUSCULAR | Status: AC
Start: 2016-07-07 — End: 2016-07-07
  Administered 2016-07-07: 8 mg via INTRAVENOUS

## 2016-07-07 MED ORDER — ONDANSETRON HCL 4 MG/2ML IJ SOLN
INTRAMUSCULAR | Status: AC
  Filled 2016-07-07: qty 2

## 2016-07-07 MED ORDER — DEXAMETHASONE SODIUM PHOSPHATE 10 MG/ML IJ SOLN: 10.0000 mg | Freq: Once | INTRAMUSCULAR | Status: DC

## 2016-07-07 MED ORDER — DEXAMETHASONE SODIUM PHOSPHATE 10 MG/ML IJ SOLN: 8.0000 mg | Freq: Once | INTRAMUSCULAR | Status: DC

## 2016-07-07 MED ORDER — HYDROMORPHONE HCL 4 MG/ML IJ SOLN
1.0000 mg | INTRAMUSCULAR | Status: DC | PRN
Start: 2016-07-07 — End: 2016-07-07
  Administered 2016-07-07: 1 mg via INTRAVENOUS

## 2016-07-07 MED ORDER — LORAZEPAM 2 MG/ML IJ SOLN
INTRAMUSCULAR | Status: AC
  Filled 2016-07-07: qty 1

## 2016-07-07 MED ORDER — SODIUM CHLORIDE 0.9 % IV SOLN: Freq: Once | INTRAVENOUS | Status: DC

## 2016-07-07 MED ORDER — DEXAMETHASONE SODIUM PHOSPHATE 10 MG/ML IJ SOLN
INTRAMUSCULAR | Status: AC
  Filled 2016-07-07: qty 1

## 2016-07-07 MED ORDER — HEPARIN SOD (PORK) LOCK FLUSH 100 UNIT/ML IV SOLN
500.0000 [IU] | INTRAVENOUS | Status: DC | PRN
  Administered 2016-07-07: 500 [IU]
  Filled 2016-07-07: qty 5

## 2016-07-07 MED ORDER — LORAZEPAM 2 MG/ML IJ SOLN: 0.5000 mg | Freq: Once | INTRAMUSCULAR | Status: DC

## 2016-07-07 MED ORDER — SODIUM CHLORIDE 0.9 % IV SOLN: 8.0000 mg | Freq: Once | INTRAVENOUS | Status: DC

## 2016-07-07 MED ORDER — LORAZEPAM 2 MG/ML IJ SOLN
0.5000 mg | Freq: Once | INTRAMUSCULAR | Status: AC
  Administered 2016-07-07: 0.5 mg via INTRAVENOUS

## 2016-07-07 MED ORDER — DEXAMETHASONE SODIUM PHOSPHATE 10 MG/ML IJ SOLN
10.0000 mg | Freq: Once | INTRAMUSCULAR | Status: AC
  Administered 2016-07-07: 10 mg via INTRAVENOUS

## 2016-07-07 MED ORDER — OLANZAPINE 10 MG PO TABS
5.0000 mg | ORAL_TABLET | Freq: Once | ORAL | Status: DC
  Filled 2016-07-07: qty 0.5

## 2016-07-07 MED ORDER — SODIUM CHLORIDE 0.9 % IJ SOLN
10.0000 mL | INTRAMUSCULAR | Status: AC | PRN
  Administered 2016-07-07: 10 mL
  Filled 2016-07-07: qty 10

## 2016-07-08 ENCOUNTER — Ambulatory Visit (HOSPITAL_BASED_OUTPATIENT_CLINIC_OR_DEPARTMENT_OTHER): Payer: No Typology Code available for payment source

## 2016-07-08 VITALS — BP 122/81 | HR 61 | Temp 98.0°F | Resp 17 | Ht 73.0 in

## 2016-07-08 DIAGNOSIS — C090 Malignant neoplasm of tonsillar fossa: Secondary | ICD-10-CM

## 2016-07-08 MED ORDER — ONDANSETRON HCL 4 MG/2ML IJ SOLN
INTRAMUSCULAR | Status: AC
  Filled 2016-07-08: qty 2

## 2016-07-08 MED ORDER — SODIUM CHLORIDE 0.9 % IV SOLN
Freq: Once | INTRAVENOUS | Status: AC
  Administered 2016-07-08: 08:00:00 via INTRAVENOUS

## 2016-07-08 MED ORDER — HYDROMORPHONE HCL 4 MG/ML IJ SOLN
1.0000 mg | INTRAMUSCULAR | Status: DC | PRN
  Administered 2016-07-08: 1 mg via INTRAVENOUS

## 2016-07-08 MED ORDER — HYDROMORPHONE HCL 4 MG/ML IJ SOLN
INTRAMUSCULAR | Status: AC
  Filled 2016-07-08: qty 1

## 2016-07-08 MED ORDER — SODIUM CHLORIDE 0.9 % IJ SOLN
10.0000 mL | INTRAMUSCULAR | Status: DC | PRN
  Administered 2016-07-08: 10 mL
  Filled 2016-07-08: qty 10

## 2016-07-08 MED ORDER — HEPARIN SOD (PORK) LOCK FLUSH 100 UNIT/ML IV SOLN
500.0000 [IU] | Freq: Once | INTRAVENOUS | Status: AC | PRN
  Administered 2016-07-08: 500 [IU]
  Filled 2016-07-08: qty 5

## 2016-07-08 NOTE — Patient Instructions (Signed)

## 2016-07-10 ENCOUNTER — Ambulatory Visit: Payer: Medicaid Other | Attending: Radiation Oncology

## 2016-07-10 ENCOUNTER — Ambulatory Visit (HOSPITAL_BASED_OUTPATIENT_CLINIC_OR_DEPARTMENT_OTHER): Payer: No Typology Code available for payment source

## 2016-07-10 ENCOUNTER — Ambulatory Visit
Admission: RE | Admit: 2016-07-10 | Discharge: 2016-07-10 | Disposition: A | Discharge status: Home / Self Care | Payer: No Typology Code available for payment source | Source: Ambulatory Visit | Attending: Radiation Oncology | Admitting: Radiation Oncology

## 2016-07-10 ENCOUNTER — Encounter: Payer: Self-pay | Admitting: Radiation Oncology

## 2016-07-10 ENCOUNTER — Ambulatory Visit (HOSPITAL_BASED_OUTPATIENT_CLINIC_OR_DEPARTMENT_OTHER): Payer: No Typology Code available for payment source | Admitting: Hematology and Oncology

## 2016-07-10 ENCOUNTER — Encounter: Payer: Self-pay | Admitting: Hematology and Oncology

## 2016-07-10 ENCOUNTER — Ambulatory Visit: Payer: No Typology Code available for payment source | Admitting: Nutrition

## 2016-07-10 ENCOUNTER — Ambulatory Visit
Admission: RE | Admit: 2016-07-10 | Discharge: 2016-07-10 | Disposition: A | Discharge status: Home / Self Care | Payer: Medicaid Other | Source: Ambulatory Visit | Attending: Radiation Oncology | Admitting: Radiation Oncology

## 2016-07-10 VITALS — BP 107/83 | HR 67 | Resp 16

## 2016-07-10 VITALS — Temp 98.5°F | Ht 73.0 in | Wt 145.8 lb

## 2016-07-10 DIAGNOSIS — G893 Neoplasm related pain (acute) (chronic): Secondary | ICD-10-CM

## 2016-07-10 DIAGNOSIS — Z931 Gastrostomy status: Secondary | ICD-10-CM

## 2016-07-10 DIAGNOSIS — D61818 Other pancytopenia: Secondary | ICD-10-CM

## 2016-07-10 DIAGNOSIS — E440 Moderate protein-calorie malnutrition: Secondary | ICD-10-CM

## 2016-07-10 DIAGNOSIS — K5900 Constipation, unspecified: Secondary | ICD-10-CM

## 2016-07-10 DIAGNOSIS — C090 Malignant neoplasm of tonsillar fossa: Secondary | ICD-10-CM

## 2016-07-10 DIAGNOSIS — Z510 Encounter for antineoplastic radiation therapy: Secondary | ICD-10-CM | POA: Diagnosis not present

## 2016-07-10 DIAGNOSIS — T451X5A Adverse effect of antineoplastic and immunosuppressive drugs, initial encounter: Secondary | ICD-10-CM

## 2016-07-10 DIAGNOSIS — R110 Nausea: Secondary | ICD-10-CM

## 2016-07-10 MED ORDER — HYDROMORPHONE HCL 4 MG/ML IJ SOLN
1.0000 mg | INTRAMUSCULAR | Status: DC | PRN
  Administered 2016-07-10: 1 mg via INTRAVENOUS

## 2016-07-10 MED ORDER — SODIUM CHLORIDE 0.9 % IV SOLN
Freq: Once | INTRAVENOUS | Status: AC
  Administered 2016-07-10: 10:00:00 via INTRAVENOUS

## 2016-07-10 MED ORDER — SODIUM CHLORIDE 0.9 % IJ SOLN
10.0000 mL | INTRAMUSCULAR | Status: AC | PRN
  Administered 2016-07-10: 10 mL
  Filled 2016-07-10: qty 10

## 2016-07-10 MED ORDER — ANTICOAGULANT SODIUM CITRATE 4% (200MG/5ML) IV SOLN
5.0000 mL | Status: AC | PRN
  Administered 2016-07-10: 5 mL
  Filled 2016-07-10: qty 5

## 2016-07-10 MED ORDER — PROMETHAZINE HCL 25 MG/ML IJ SOLN
12.5000 mg | Freq: Once | INTRAMUSCULAR | Status: AC
  Administered 2016-07-10: 12.5 mg via INTRAVENOUS
  Filled 2016-07-10: qty 1

## 2016-07-10 MED ORDER — ONDANSETRON HCL 4 MG/2ML IJ SOLN: 8.0000 mg | Freq: Once | INTRAMUSCULAR | Status: DC

## 2016-07-10 MED ORDER — HYDROMORPHONE HCL 4 MG/ML IJ SOLN
INTRAMUSCULAR | Status: AC
  Filled 2016-07-10: qty 1

## 2016-07-10 MED ORDER — SODIUM CHLORIDE 0.9 % IV SOLN: Freq: Once | INTRAVENOUS | Status: DC

## 2016-07-10 NOTE — Progress Notes (Signed)
   Weekly Management Note:  Outpatient    ICD-9-CM ICD-10-CM   1. 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HCC) 146.1 C09.0     Current Dose: 70 Gy  Projected Dose: 70 Gy    Narrative:  The patient presents for routine under treatment assessment.  CBCT/MVCT images/Port film x-rays were reviewed.  The chart was checked.   He denies pain.  He has nausea and constipation.  Receiving IV fluids and sees nutritionist tomorrow.  Will have close followup in med onc over the next month.  Dry peeling noted over neck, uses sonafine.   Physical Findings:  Wt Readings from Last 3 Encounters:  07/10/16 145 lb 12.8 oz (66.1 kg)  07/10/16 145 lb 12.8 oz (66.1 kg)  07/04/16 152 lb 9.6 oz (69.2 kg)    height is 6\' 1"  (1.854 m) and weight is 145 lb 12.8 oz (66.1 kg). His temperature is 98.5 F (36.9 C). His oxygen saturation is 100%.  NAD,no tumor appreciated in left tonsil.  No thrush. Confluent mucositis over oropharynx.  Skin hyperpigmented with dry peeling over neck  CBC    Component Value Date/Time   WBC 2.0 (L) 06/30/2016 0914   WBC 9.1 05/17/2016 1250   RBC 3.96 (L) 06/30/2016 0914   RBC 4.78 05/17/2016 1250   HGB 12.4 (L) 06/30/2016 0914   HCT 35.4 (L) 06/30/2016 0914   PLT 114 (L) 06/30/2016 0914   MCV 89.4 06/30/2016 0914   MCH 31.3 06/30/2016 0914   MCH 31.8 05/17/2016 1250   MCHC 35.0 06/30/2016 0914   MCHC 35.1 05/17/2016 1250   RDW 12.9 06/30/2016 0914   LYMPHSABS 0.4 (L) 06/30/2016 0914   MONOABS 0.3 06/30/2016 0914   EOSABS 0.0 06/30/2016 0914   BASOSABS 0.0 06/30/2016 0914     CMP     Component Value Date/Time   NA 139 06/30/2016 0914   K 4.7 06/30/2016 0914   CL 105 05/17/2016 1250   CO2 28 06/30/2016 0914   GLUCOSE 74 06/30/2016 0914   BUN 17.7 06/30/2016 0914   CREATININE 0.8 06/30/2016 0914   CALCIUM 10.0 06/30/2016 0914   PROT 7.7 06/30/2016 0914   ALBUMIN 3.6 06/30/2016 0914   AST 21 06/30/2016 0914   ALT 18 06/30/2016 0914   ALKPHOS 81 06/30/2016 0914   BILITOT <0.22 06/30/2016 0914   GFRNONAA >60 05/17/2016 1250   GFRAA >60 05/17/2016 1250     Impression:  The patient tolerated radiotherapy.   Plan:   Laxative education provided today.  Discussed antiemetic usage with patient.  Taking these meds regularly, and managing constipation, may help his nausea.  Also, more time elapsing from chemotherapy will help.  F/u in 1 mo in rad/onc, sooner in med/onc, continue IV fluids.  Neosporin and sonafine for skin care.   Eppie Gibson, MD

## 2016-07-10 NOTE — Assessment & Plan Note (Signed)
He has significant pain but is reluctant to take medication as prescribed. I reinforced importance of taking his pain medicine as prescribed

## 2016-07-10 NOTE — Assessment & Plan Note (Signed)
The patient have loss of appetite and weight loss that could be exacerbated by constipation. I will proceed to add daily Miralax to his regimen and switch his anti-emetics to Phenergan

## 2016-07-10 NOTE — Progress Notes (Signed)

## 2016-07-10 NOTE — Assessment & Plan Note (Signed)
He has progressive recent weight loss related to nausea. I recommend him to increase nutritional supplement as tolerated via the feeding tube  He will continue regular visits with dietitian

## 2016-07-10 NOTE — Assessment & Plan Note (Signed)
He had completed recent chemotherapy. He is not symptomatic from pancytopenia. Recommend close observation

## 2016-07-10 NOTE — Assessment & Plan Note (Signed)
He tolerated treatment well with worsening expected side effects. He will continue to be seen on a weekly basis for supportive care

## 2016-07-10 NOTE — Progress Notes (Signed)
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 Chronic small-vessel ischemic changes of the cerebral hemispheric white matter.       04/04/2016 Imaging    CT neck showed left tonsillar mass with diameter of 19 x 24 mm consistent with 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 necrosis. Suspicious level 5/supraclavicular nodes on the left. Scar type density in the right upper lobe is more prominent than was seen on a CT scan of the chest 10/06/2014      04/21/2016 PET scan    Asymmetric hypermetabolic soft tissue prominence in left palatine tonsil, consistent with known primary left tonsillar squamous cell Carcinoma. Left level 2 cervical lymphadenopathy, consistent with metastatic disease. No evidence of metastatic disease within the chest, abdomen, or pelvis. Stable 14 mm ground-glass and part solid nodular opacity in right lung apex compared to previous CT in 2016. This shows low-grade metabolic activity. Low-grade bronchogenic adenocarcinoma cannot be excluded. Consider surgical resection versus continued followup by CT in 12 months      05/17/2016 Procedure    Successful placement of a 20 French pull  through gastrostomy tube & right IJ approach [facility] [place].      05/22/2016 - 07/03/2016 Chemotherapy    He received 3 doses of high dose cisplatin with radiation       05/23/2016 -  Radiation Therapy    He received concurrent radiation therapy       INTERVAL HISTORY: Please see below for problem oriented charting. He is seen in the infusion room. He completed radiation therapy today. He tolerated treatment well but has mild worsening mucositis pain. He has not restarted using his fentanyl patch because he perceived that is making him sick He has not been using liquid morphine for breakthrough pain on a regular basis. His rated his pain at 6-7 out of 10 He has a lot of nausea and recent vomiting. He is severely constipated Denies worsening hearing loss or tinnitus Denies neuropathy REVIEW OF SYSTEMS:   Constitutional: Denies fevers, chills or abnormal weight loss Eyes: Denies blurriness of vision Respiratory: Denies cough, dyspnea or wheezes Cardiovascular: Denies palpitation, chest discomfort or lower extremity swelling Skin: Denies abnormal skin rashes Lymphatics: Denies new lymphadenopathy or easy bruising Neurological:Denies numbness, tingling or new weaknesses Behavioral/Psych: Mood is stable, no new changes  All other systems were reviewed with the patient and are negative.  I have reviewed the past medical history, past surgical history, social history and family history with the patient and they are unchanged from previous note.  ALLERGIES:  has No Known Allergies.  MEDICATIONS:  Current Outpatient Prescriptions  Medication Sig Dispense Refill  . Ethyl Alcohol, Skin Cleanser, (PURELL INSTANT HAND SANITIZER) 65 % FOAM Use as needed to sanitize  hands 160 mL 5  . fentaNYL (DURAGESIC - DOSED MCG/HR) 25 MCG/HR patch Place 1 patch (25 mcg total) onto the skin every 3 (three) days. (Patient not taking: Reported on 07/10/2016) 5 patch 0  . lidocaine (XYLOCAINE) 2 % solution  Patient: Mix 1part 2% viscous lidocaine, 1part H20. Swallow 66mL of this mixture, 39min before meals and at bedtime, up to QID PRN soreness (Patient not taking: Reported on 07/10/2016) 100 mL 5  . lidocaine-prilocaine (EMLA) cream Apply to affected area once 30 g 3  . LORazepam (ATIVAN) 0.5 MG tablet Take 1 tablet up to BID PRN nausea or anxiety.  Can be taken 30 min before radiotherapy for anxiety. 50 tablet 0  . Morphine Sulfate (MORPHINE CONCENTRATE) 10 mg / 0.5 ml concentrated solution Take 0.5 mLs (10 mg total) by mouth every 2 (two) hours as needed for severe pain. 240 mL 0  . Nutritional Supplements (FEEDING SUPPLEMENT, OSMOLITE 1.5 CAL,) LIQD Give one can Osmolite 1.5 - 7 times daily, every 2 hours with 60 cc free water before and after each feeding. 7 Bottle 0  . ondansetron (ZOFRAN) 8 MG tablet Take 1 tablet (8 mg total) by mouth every 8 (eight) hours as needed. Start on the third day after chemotherapy. 60 tablet 1  . polyethylene glycol (MIRALAX) packet Take 17 g by mouth daily. 14 each 9  . prochlorperazine (COMPAZINE) 10 MG tablet Take 1 tablet (10 mg total) by mouth every 6 (six) hours as needed (Nausea or vomiting). 90 tablet 3  . sodium fluoride (FLUORISHIELD) 1.1 % GEL dental gel Instill one drop of gel per tooth space of fluoride tray. Place over teeth for 5 minutes. Remove. Spit out excess. Repeat nightly. 120 mL prn  . sucralfate (CARAFATE) 1 g tablet Dissolve 1 tablet in 10 mL H20 and swallow up to QID PRN sore throat (Patient not taking: Reported on 07/10/2016) 40 tablet 5   Current Facility-Administered Medications  Medication Dose Route Frequency Provider Last Rate Last Dose  . dexamethasone (DECADRON) injection 10 mg  10 mg Intravenous Once Heath Lark, MD      . LORazepam (ATIVAN) injection 0.5 mg  0.5 mg Intravenous Once Heath Lark, MD       Facility-Administered Medications Ordered in Other Visits  Medication Dose Route Frequency Provider Last Rate Last Dose  . sodium  chloride flush (NS) 0.9 % injection 10 mL  10 mL Intracatheter PRN Heath Lark, MD   10 mL at 05/24/16 1615    PHYSICAL EXAMINATION: ECOG PERFORMANCE STATUS: 2 - Symptomatic, <50% confined to bed  Vitals:   07/10/16 1113  BP: 107/83  Pulse: 66  Resp: 18  Temp: 98.5 F (36.9 C)   Filed Weights   07/10/16 1113  Weight: 145 lb 12.8 oz (66.1 kg)    GENERAL:alert, no distress and comfortable. He looks thin and cachectic SKIN: Noted radiation-induced skin changes  EYES: normal, Conjunctiva are pink and non-injected, sclera clear OROPHARYNX:no exudate, no erythema and lips, buccal mucosa, and tongue normal  NECK: supple, thyroid normal size, non-tender, without nodularity LYMPH:  no palpable lymphadenopathy in the cervical, axillary or inguinal LUNGS: clear to auscultation and percussion with normal breathing effort HEART: regular rate & rhythm and no murmurs and no lower extremity edema ABDOMEN:abdomen soft, non-tender and normal bowel sounds. Feeding tube site looks okay Musculoskeletal:no cyanosis of digits and no clubbing  NEURO: alert & oriented x 3 with fluent speech, no focal motor/sensory deficits  LABORATORY DATA:  I have reviewed  the data as listed    Component Value Date/Time   NA 139 06/30/2016 0914   K 4.7 06/30/2016 0914   CL 105 05/17/2016 1250   CO2 28 06/30/2016 0914   GLUCOSE 74 06/30/2016 0914   BUN 17.7 06/30/2016 0914   CREATININE 0.8 06/30/2016 0914   CALCIUM 10.0 06/30/2016 0914   PROT 7.7 06/30/2016 0914   ALBUMIN 3.6 06/30/2016 0914   AST 21 06/30/2016 0914   ALT 18 06/30/2016 0914   ALKPHOS 81 06/30/2016 0914   BILITOT <0.22 06/30/2016 0914   GFRNONAA >60 05/17/2016 1250   GFRAA >60 05/17/2016 1250    No results found for: SPEP, UPEP  Lab Results  Component Value Date   WBC 2.0 (L) 06/30/2016   NEUTROABS 1.4 (L) 06/30/2016   HGB 12.4 (L) 06/30/2016   HCT 35.4 (L) 06/30/2016   MCV 89.4 06/30/2016   PLT 114 (L) 06/30/2016      Chemistry       Component Value Date/Time   NA 139 06/30/2016 0914   K 4.7 06/30/2016 0914   CL 105 05/17/2016 1250   CO2 28 06/30/2016 0914   BUN 17.7 06/30/2016 0914   CREATININE 0.8 06/30/2016 0914      Component Value Date/Time   CALCIUM 10.0 06/30/2016 0914   ALKPHOS 81 06/30/2016 0914   AST 21 06/30/2016 0914   ALT 18 06/30/2016 0914   BILITOT <0.22 06/30/2016 0914      ASSESSMENT & PLAN:  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HCC) He tolerated treatment well with worsening expected side effects. He will continue to be seen on a weekly basis for supportive care  Cancer associated pain He has significant pain but is reluctant to take medication as prescribed. I reinforced importance of taking his pain medicine as prescribed  Chemotherapy-induced nausea The patient felt much better with IV fluid resuscitation and IV anti-emetics This is related to recent treatment side effects, exacerbated by constipation We discussed potential use of daily IV fluids for additional hydration support and he agreed to proceed.  Constipation, acute The patient have loss of appetite and weight loss that could be exacerbated by constipation. I will proceed to add daily Miralax to his regimen and switch his anti-emetics to Phenergan  Protein-calorie malnutrition, moderate ([place]) He has progressive recent weight loss related to nausea. I recommend him to increase nutritional supplement as tolerated via the feeding tube  He will continue regular visits with dietitian  S/P gastrostomy ([place]) The feeding tube site looks clean without signs of infection.  Pancytopenia, acquired [facility]) He had completed recent chemotherapy. He is not symptomatic from pancytopenia. Recommend close observation   No orders of the defined types were placed in this encounter.  All questions were answered. The patient knows to call the clinic with any problems, questions or concerns. No barriers to learning was detected. I  spent 25 minutes counseling the patient face to face. The total time spent in the appointment was 30 minutes and more than 50% was on counseling and review of test results     Heath Lark, MD 07/10/2016 1:26 PM

## 2016-07-10 NOTE — Assessment & Plan Note (Signed)
The patient felt much better with IV fluid resuscitation and IV anti-emetics This is related to recent treatment side effects, exacerbated by constipation We discussed potential use of daily IV fluids for additional hydration support and he agreed to proceed.

## 2016-07-10 NOTE — Progress Notes (Signed)
[name] [name] presents for his last fraction of radiation to his Left Tonsil and Bilateral Neck. He denies pain. He is not using his Fentanyl patch and occasionally will use his morphine solution. He reports ongoing nausea. His last chemotherapy was one week ago, and he reports feeling worse since that treatment.  He is receiving IV fluids Monday through Friday in Medical Oncology. He is also receiving antiemetics during these infusions. He is instilling only about 1-2 cans of osmolite daily. He is instilling free water in his PEG, but not that much due to his nausea. He reports constipation, and last had a bowel movement three days ago. He also not using laxatives due to nausea. His neck is hyperpigmented. He has areas of dry peeling to the left side of his neck. He denies drainage from these areas. He has been using sonafine, but not neosporin. He was given neosporin samples today to use.   Temp 98.5 F (36.9 C)   Ht 6\' 1"  (1.854 m)   Wt 145 lb 12.8 oz (66.1 kg)   SpO2 100% Comment: room air  BMI 19.24 kg/m    Orthostatics: BP sitting 108/84 pulse 89, BP standing 106/66 pulse 95.  Wt Readings from Last 3 Encounters:  07/10/16 145 lb 12.8 oz (66.1 kg)  07/04/16 152 lb 9.6 oz (69.2 kg)  07/03/16 150 lb 12.8 oz (68.4 kg)

## 2016-07-10 NOTE — Assessment & Plan Note (Signed)
The feeding tube site looks clean without signs of infection.

## 2016-07-10 NOTE — Progress Notes (Signed)
k

## 2016-07-10 NOTE — Progress Notes (Signed)
Nutrition follow-up completed with patient in the infusion room receiving IV fluids for tonsil cancer. Patient was just given nausea medicine and pain medication so he is unable to stay awake long enough to speak to me for any length of time. Weight has decreased was documented as 145.8 pounds November 6, down from 150 pounds last week Patient reports he has nausea and abdominal pain. He complains of of constipation. Labs were reviewed.  Estimated nutrition needs: 2460-2660 calories, 95-100 grams protein, 2.6 L fluid.  Nutrition diagnosis: Inadequate oral intake continues.  Intervention:  I enforced the importance of patient increasing bolus tube feeding to goal rate of 7 cans daily. Encouraged patient to take pain and nausea medication as needed and continue water flushes. Recommend changing tube feedings to continuous tube feeding to provide increased nutrition.   Patient was unable to make any decisions today. . Monitoring, evaluation, goals: Patient will tolerate increased tube feedings to minimize weight loss and improve hydration.  Next visit: I will follow-up with patient tomorrow morning in the infusion room.  **Disclaimer: This note was dictated with voice recognition software. Similar sounding words can inadvertently be transcribed and this note may contain transcription errors which may not have been corrected upon publication of note.**

## 2016-07-10 NOTE — Patient Instructions (Signed)

## 2016-07-11 ENCOUNTER — Ambulatory Visit (HOSPITAL_BASED_OUTPATIENT_CLINIC_OR_DEPARTMENT_OTHER): Payer: Medicaid Other

## 2016-07-11 ENCOUNTER — Other Ambulatory Visit: Payer: Self-pay | Admitting: Hematology and Oncology

## 2016-07-11 ENCOUNTER — Telehealth: Payer: Self-pay | Admitting: *Deleted

## 2016-07-11 ENCOUNTER — Ambulatory Visit: Payer: No Typology Code available for payment source | Admitting: Nutrition

## 2016-07-11 VITALS — BP 103/68 | HR 60 | Temp 98.3°F | Resp 20

## 2016-07-11 DIAGNOSIS — C090 Malignant neoplasm of tonsillar fossa: Secondary | ICD-10-CM

## 2016-07-11 DIAGNOSIS — G893 Neoplasm related pain (acute) (chronic): Secondary | ICD-10-CM

## 2016-07-11 DIAGNOSIS — R110 Nausea: Secondary | ICD-10-CM | POA: Diagnosis not present

## 2016-07-11 MED ORDER — HEPARIN SOD (PORK) LOCK FLUSH 100 UNIT/ML IV SOLN
500.0000 [IU] | INTRAVENOUS | Status: DC | PRN
  Administered 2016-07-11: 500 [IU]
  Filled 2016-07-11: qty 5

## 2016-07-11 MED ORDER — SODIUM CHLORIDE 0.9 % IJ SOLN
10.0000 mL | INTRAMUSCULAR | Status: AC | PRN
  Administered 2016-07-11: 10 mL
  Filled 2016-07-11: qty 10

## 2016-07-11 MED ORDER — ONDANSETRON HCL 4 MG/2ML IJ SOLN
8.0000 mg | Freq: Once | INTRAMUSCULAR | Status: AC
  Administered 2016-07-11: 8 mg via INTRAVENOUS

## 2016-07-11 MED ORDER — HYDROMORPHONE HCL 4 MG/ML IJ SOLN
INTRAMUSCULAR | Status: AC
  Filled 2016-07-11: qty 1

## 2016-07-11 MED ORDER — ONDANSETRON HCL 4 MG/2ML IJ SOLN
INTRAMUSCULAR | Status: AC
  Filled 2016-07-11: qty 4

## 2016-07-11 MED ORDER — SODIUM CHLORIDE 0.9 % IV SOLN
Freq: Once | INTRAVENOUS | Status: AC
  Administered 2016-07-11: 08:00:00 via INTRAVENOUS

## 2016-07-11 MED ORDER — HYDROMORPHONE HCL 4 MG/ML IJ SOLN
1.0000 mg | INTRAMUSCULAR | Status: DC | PRN
  Administered 2016-07-11: 1 mg via INTRAVENOUS

## 2016-07-11 MED ORDER — HYDROMORPHONE HCL 4 MG/ML IJ SOLN
1.0000 mg | Freq: Once | INTRAMUSCULAR | Status: AC
  Administered 2016-07-11: 1 mg via INTRAVENOUS

## 2016-07-11 MED ORDER — OSMOLITE 1.5 CAL PO LIQD: ORAL | 0 refills | Status: DC

## 2016-07-11 MED ORDER — SODIUM CHLORIDE 0.9 % IV SOLN: Freq: Once | INTRAVENOUS | Status: DC

## 2016-07-11 NOTE — Patient Instructions (Signed)

## 2016-07-11 NOTE — Telephone Encounter (Signed)
CALLED PATIENT TO INFORM OF FU APPT. WITH DR. Isidore Moos ON 08-11-16 @ 2 PM, SPOKE WITH PATIENT'S MOM- [name] AND SHE IS AWARE OF THIS APPT.

## 2016-07-11 NOTE — Progress Notes (Signed)
Nutrition follow-up completed with patient during IV fluids for tonsil cancer. Patient confirms he is still unable to increase bolus tube feeding to goal rate. Patient agrees to continuous tube feeding.  Estimated nutrition needs: 2460-2660 calories, 95-110 grams protein, 2.6 L fluid.  Nutrition diagnosis: Inadequate oral intake continues.  Intervention:  Begin Osmolite 1.5 at 60 mL an hour and increase 10 cc every 4 hours to goal rate of 120 cc over 14 hours. Patient will flush feeding tube with 120 cc of water before and after continuous feedings. He will get an additional 240 cc free water 4 times daily via feeding tube. Patient was educated on continuous tube feedings and given copy of tube feeding regimen. [facility] was notified and orders were written.  Tube feeding provides 2485 cal, 104.3 g protein, 2467 FREE water meeting greater than 90% of estimated nutrition needs.  Monitoring, evaluation, goals:  Patient will tolerate continuous tube feedings with free water flushes to minimize weight loss and improve hydration.  Next visit: Monday, November 13.  Patient has my contact information if he develops questions before next visit.  **Disclaimer: This note was dictated with voice recognition software. Similar sounding words can inadvertently be transcribed and this note may contain transcription errors which may not have been corrected upon publication of note.**

## 2016-07-12 ENCOUNTER — Encounter: Payer: Self-pay | Admitting: *Deleted

## 2016-07-12 ENCOUNTER — Ambulatory Visit (HOSPITAL_BASED_OUTPATIENT_CLINIC_OR_DEPARTMENT_OTHER): Payer: Medicaid Other

## 2016-07-12 ENCOUNTER — Encounter: Payer: Self-pay | Admitting: Radiation Oncology

## 2016-07-12 VITALS — BP 104/79 | HR 78 | Temp 98.8°F | Resp 16

## 2016-07-12 DIAGNOSIS — C090 Malignant neoplasm of tonsillar fossa: Secondary | ICD-10-CM

## 2016-07-12 DIAGNOSIS — G893 Neoplasm related pain (acute) (chronic): Secondary | ICD-10-CM

## 2016-07-12 MED ORDER — ONDANSETRON HCL 4 MG/2ML IJ SOLN
8.0000 mg | Freq: Once | INTRAMUSCULAR | Status: AC
  Administered 2016-07-12: 8 mg via INTRAVENOUS

## 2016-07-12 MED ORDER — HYDROMORPHONE HCL 4 MG/ML IJ SOLN
INTRAMUSCULAR | Status: AC
  Filled 2016-07-12: qty 1

## 2016-07-12 MED ORDER — ONDANSETRON HCL 4 MG/2ML IJ SOLN
INTRAMUSCULAR | Status: AC
  Filled 2016-07-12: qty 4

## 2016-07-12 MED ORDER — SODIUM CHLORIDE 0.9 % IJ SOLN
10.0000 mL | INTRAMUSCULAR | Status: DC | PRN
  Administered 2016-07-12: 10 mL
  Filled 2016-07-12: qty 10

## 2016-07-12 MED ORDER — SODIUM CHLORIDE 0.9 % IV SOLN: Freq: Once | INTRAVENOUS | Status: DC

## 2016-07-12 MED ORDER — SODIUM CHLORIDE 0.9 % IV SOLN
Freq: Once | INTRAVENOUS | Status: AC
  Administered 2016-07-12: 09:00:00 via INTRAVENOUS

## 2016-07-12 MED ORDER — HYDROMORPHONE HCL 4 MG/ML IJ SOLN
2.0000 mg | INTRAMUSCULAR | Status: DC | PRN
  Administered 2016-07-12: 2 mg via INTRAVENOUS

## 2016-07-12 MED ORDER — HEPARIN SOD (PORK) LOCK FLUSH 100 UNIT/ML IV SOLN
500.0000 [IU] | Freq: Once | INTRAVENOUS | Status: AC | PRN
  Administered 2016-07-12: 500 [IU]
  Filled 2016-07-12: qty 5

## 2016-07-12 NOTE — Progress Notes (Signed)
Oncology Nurse Navigator Documentation  Met with Mr. Malinowski in Infusion where he was receiving scheduled IVF. I recognized his completion of XRT on Monday. I discussed expectations for next several months of post-treatment.  He voiced understanding. He knows he can contact me with needs/concerns.  Gayleen Orem, RN, BSN, [facility] [place] [phone]

## 2016-07-12 NOTE — Patient Instructions (Signed)

## 2016-07-12 NOTE — Progress Notes (Incomplete)
°  Radiation Oncology         ([phone]) [phone] ________________________________  Name: [name] [name] MRN: [id number]  Date: 07/12/2016  DOB: 06/24/1956  End of Treatment Note  Diagnosis:   Malignant neoplasm of tonsillar fossa     Indication for treatment:  Curative      Radiation treatment dates:   05/23/16-07/10/16  Site/dose:   Left tonsil, bilateral neck/ 70 Gy in 35 Fx  Beams/energy:  IMRT/ 6X  Narrative: The patient tolerated radiation treatment relatively well.   Patient noted nausea, constipation, and dry peeling on neck during the treatment.   Plan: The patient has completed radiation treatment. The patient will return to radiation oncology clinic for routine followup in one month. I advised them to call or return sooner if they have any questions or concerns related to their recovery or treatment.  -----------------------------------  Eppie Gibson, MD  This document serves as a record of services personally performed by Eppie Gibson, MD. It was created on her behalf by Bethann Humble, a trained medical scribe. The creation of this record is based on the scribe's personal observations and the provider's statements to them. This document has been checked and approved by the attending provider.

## 2016-07-13 ENCOUNTER — Telehealth (HOSPITAL_COMMUNITY): Payer: Self-pay | Admitting: *Deleted

## 2016-07-13 ENCOUNTER — Ambulatory Visit (HOSPITAL_BASED_OUTPATIENT_CLINIC_OR_DEPARTMENT_OTHER): Payer: No Typology Code available for payment source

## 2016-07-13 VITALS — BP 119/85 | HR 68 | Temp 98.2°F | Resp 16

## 2016-07-13 DIAGNOSIS — R110 Nausea: Secondary | ICD-10-CM

## 2016-07-13 DIAGNOSIS — C090 Malignant neoplasm of tonsillar fossa: Secondary | ICD-10-CM

## 2016-07-13 MED ORDER — SODIUM CHLORIDE 0.9 % IV SOLN: Freq: Once | INTRAVENOUS | Status: DC

## 2016-07-13 MED ORDER — HEPARIN SOD (PORK) LOCK FLUSH 100 UNIT/ML IV SOLN
500.0000 [IU] | Freq: Once | INTRAVENOUS | Status: AC | PRN
  Administered 2016-07-13: 500 [IU]
  Filled 2016-07-13: qty 5

## 2016-07-13 MED ORDER — SODIUM CHLORIDE 0.9 % IJ SOLN
10.0000 mL | INTRAMUSCULAR | Status: DC | PRN
  Administered 2016-07-13: 10 mL
  Filled 2016-07-13: qty 10

## 2016-07-13 MED ORDER — ONDANSETRON HCL 4 MG/2ML IJ SOLN
8.0000 mg | Freq: Once | INTRAMUSCULAR | Status: AC
  Administered 2016-07-13: 8 mg via INTRAVENOUS

## 2016-07-13 MED ORDER — SODIUM CHLORIDE 0.9 % IV SOLN
Freq: Once | INTRAVENOUS | Status: AC
  Administered 2016-07-13: 09:00:00 via INTRAVENOUS

## 2016-07-13 MED ORDER — PROMETHAZINE HCL 25 MG/ML IJ SOLN
12.5000 mg | Freq: Once | INTRAMUSCULAR | Status: AC
  Administered 2016-07-13: 12.5 mg via INTRAVENOUS
  Filled 2016-07-13: qty 1

## 2016-07-13 MED ORDER — ONDANSETRON HCL 4 MG/2ML IJ SOLN
INTRAMUSCULAR | Status: AC
  Filled 2016-07-13: qty 4

## 2016-07-13 NOTE — Progress Notes (Signed)
Near the end of NS infusion, patient began c/o increased nausea.  Medicated per orders.  Patient tolerated well and voiced full relief prior to discharge.

## 2016-07-13 NOTE — Patient Instructions (Signed)

## 2016-07-13 NOTE — Telephone Encounter (Signed)
Left message on voicemail per DPR in reference to upcoming appointment scheduled on 07/19/16 at 2:30 with detailed instructions given per Stress Test Requisition Sheet for the test. LM to arrive 30 minutes early, and that it is imperative to arrive on time for appointment to keep from having the test rescheduled. If you need to cancel or reschedule your appointment, please call the office within 24 hours of your appointment. Failure to do so may result in a cancellation of your appointment, and a $50 no show fee. Phone number given for call back for any questions. Veronia Beets

## 2016-07-14 ENCOUNTER — Ambulatory Visit (HOSPITAL_BASED_OUTPATIENT_CLINIC_OR_DEPARTMENT_OTHER): Payer: No Typology Code available for payment source

## 2016-07-14 VITALS — BP 117/79 | HR 56 | Temp 98.9°F | Resp 16

## 2016-07-14 DIAGNOSIS — C090 Malignant neoplasm of tonsillar fossa: Secondary | ICD-10-CM

## 2016-07-14 DIAGNOSIS — R110 Nausea: Secondary | ICD-10-CM

## 2016-07-14 MED ORDER — SODIUM CHLORIDE 0.9 % IJ SOLN
10.0000 mL | INTRAMUSCULAR | Status: DC | PRN
  Administered 2016-07-14: 10 mL
  Filled 2016-07-14: qty 10

## 2016-07-14 MED ORDER — HEPARIN SOD (PORK) LOCK FLUSH 100 UNIT/ML IV SOLN
500.0000 [IU] | Freq: Once | INTRAVENOUS | Status: AC | PRN
  Administered 2016-07-14: 500 [IU]
  Filled 2016-07-14: qty 5

## 2016-07-14 MED ORDER — SODIUM CHLORIDE 0.9 % IV SOLN
Freq: Once | INTRAVENOUS | Status: DC
  Filled 2016-07-14: qty 4

## 2016-07-14 MED ORDER — ONDANSETRON HCL 4 MG/2ML IJ SOLN
INTRAMUSCULAR | Status: AC
  Filled 2016-07-14: qty 4

## 2016-07-14 MED ORDER — HYDROMORPHONE HCL 4 MG/ML IJ SOLN
INTRAMUSCULAR | Status: AC
  Filled 2016-07-14: qty 1

## 2016-07-14 MED ORDER — SODIUM CHLORIDE 0.9 % IV SOLN
Freq: Once | INTRAVENOUS | Status: AC
  Administered 2016-07-14: 09:00:00 via INTRAVENOUS

## 2016-07-14 MED ORDER — ONDANSETRON HCL 4 MG/2ML IJ SOLN
8.0000 mg | Freq: Once | INTRAMUSCULAR | Status: AC
  Administered 2016-07-14: 8 mg via INTRAVENOUS

## 2016-07-14 MED ORDER — HYDROMORPHONE HCL 4 MG/ML IJ SOLN
2.0000 mg | INTRAMUSCULAR | Status: DC | PRN
  Administered 2016-07-14: 2 mg via INTRAVENOUS

## 2016-07-14 MED FILL — POLYETHYLENE GLYCOL 3350: 15 days supply | Qty: 255 | Fill #1

## 2016-07-14 MED FILL — CHLORHEXIDINE 0.12% RINSE: 0.12 | 16 days supply | Qty: 473 | Fill #0

## 2016-07-14 NOTE — Patient Instructions (Signed)

## 2016-07-15 ENCOUNTER — Ambulatory Visit (HOSPITAL_BASED_OUTPATIENT_CLINIC_OR_DEPARTMENT_OTHER): Payer: No Typology Code available for payment source

## 2016-07-15 VITALS — BP 114/88 | HR 61 | Temp 97.8°F | Resp 16

## 2016-07-15 DIAGNOSIS — C090 Malignant neoplasm of tonsillar fossa: Secondary | ICD-10-CM

## 2016-07-15 MED ORDER — SODIUM CHLORIDE 0.9 % IV SOLN
Freq: Once | INTRAVENOUS | Status: AC
  Administered 2016-07-15: 09:00:00 via INTRAVENOUS

## 2016-07-15 MED ORDER — SODIUM CHLORIDE 0.9 % IJ SOLN
10.0000 mL | INTRAMUSCULAR | Status: DC | PRN
  Filled 2016-07-15: qty 10

## 2016-07-15 MED ORDER — SODIUM CHLORIDE 0.9 % IJ SOLN
10.0000 mL | INTRAMUSCULAR | Status: AC | PRN
  Administered 2016-07-15: 10 mL
  Filled 2016-07-15: qty 10

## 2016-07-15 MED ORDER — ONDANSETRON HCL 4 MG/2ML IJ SOLN
INTRAMUSCULAR | Status: AC
  Filled 2016-07-15: qty 4

## 2016-07-15 MED ORDER — HEPARIN SOD (PORK) LOCK FLUSH 100 UNIT/ML IV SOLN
250.0000 [IU] | Freq: Once | INTRAVENOUS | Status: DC | PRN
  Filled 2016-07-15: qty 5

## 2016-07-15 MED ORDER — HYDROMORPHONE HCL 4 MG/ML IJ SOLN
2.0000 mg | INTRAMUSCULAR | Status: DC | PRN
  Administered 2016-07-15: 2 mg via INTRAVENOUS

## 2016-07-15 MED ORDER — HEPARIN SOD (PORK) LOCK FLUSH 100 UNIT/ML IV SOLN
500.0000 [IU] | INTRAVENOUS | Status: DC | PRN
  Filled 2016-07-15: qty 5

## 2016-07-15 MED ORDER — HEPARIN SOD (PORK) LOCK FLUSH 100 UNIT/ML IV SOLN
500.0000 [IU] | Freq: Once | INTRAVENOUS | Status: DC | PRN
  Filled 2016-07-15: qty 5

## 2016-07-15 MED ORDER — ANTICOAGULANT SODIUM CITRATE 4% (200MG/5ML) IV SOLN: 5.0000 mL | Status: DC | PRN

## 2016-07-15 MED ORDER — HYDROMORPHONE HCL 4 MG/ML IJ SOLN
INTRAMUSCULAR | Status: AC
  Filled 2016-07-15: qty 1

## 2016-07-15 MED ORDER — ONDANSETRON HCL 40 MG/20ML IJ SOLN
Freq: Once | INTRAMUSCULAR | Status: AC
  Administered 2016-07-15: 09:00:00 via INTRAVENOUS

## 2016-07-15 MED ORDER — ALTEPLASE 2 MG IJ SOLR
2.0000 mg | Freq: Once | INTRAMUSCULAR | Status: DC | PRN
  Filled 2016-07-15: qty 2

## 2016-07-15 NOTE — Patient Instructions (Signed)

## 2016-07-17 ENCOUNTER — Ambulatory Visit (HOSPITAL_BASED_OUTPATIENT_CLINIC_OR_DEPARTMENT_OTHER): Payer: No Typology Code available for payment source

## 2016-07-17 ENCOUNTER — Ambulatory Visit: Payer: No Typology Code available for payment source | Admitting: Nutrition

## 2016-07-17 VITALS — BP 132/79 | HR 56 | Temp 98.5°F | Resp 17

## 2016-07-17 DIAGNOSIS — R110 Nausea: Secondary | ICD-10-CM

## 2016-07-17 DIAGNOSIS — C090 Malignant neoplasm of tonsillar fossa: Secondary | ICD-10-CM

## 2016-07-17 DIAGNOSIS — G893 Neoplasm related pain (acute) (chronic): Secondary | ICD-10-CM

## 2016-07-17 MED ORDER — ONDANSETRON HCL 4 MG/2ML IJ SOLN
8.0000 mg | Freq: Once | INTRAMUSCULAR | Status: AC
  Administered 2016-07-17: 8 mg via INTRAVENOUS

## 2016-07-17 MED ORDER — SODIUM CHLORIDE 0.9 % IV SOLN: Freq: Once | INTRAVENOUS | Status: DC

## 2016-07-17 MED ORDER — HEPARIN SOD (PORK) LOCK FLUSH 100 UNIT/ML IV SOLN
500.0000 [IU] | Freq: Once | INTRAVENOUS | Status: AC | PRN
  Administered 2016-07-17: 500 [IU]
  Filled 2016-07-17: qty 5

## 2016-07-17 MED ORDER — SODIUM CHLORIDE 0.9 % IV SOLN
Freq: Once | INTRAVENOUS | Status: AC
  Administered 2016-07-17: 09:00:00 via INTRAVENOUS

## 2016-07-17 MED ORDER — HYDROMORPHONE HCL 4 MG/ML IJ SOLN
2.0000 mg | INTRAMUSCULAR | Status: DC | PRN
  Administered 2016-07-17: 1 mg via INTRAVENOUS

## 2016-07-17 MED ORDER — SODIUM CHLORIDE 0.9 % IJ SOLN
10.0000 mL | INTRAMUSCULAR | Status: DC | PRN
  Administered 2016-07-17: 10 mL
  Filled 2016-07-17: qty 10

## 2016-07-17 MED ORDER — ONDANSETRON HCL 4 MG/2ML IJ SOLN
INTRAMUSCULAR | Status: AC
  Filled 2016-07-17: qty 4

## 2016-07-17 MED ORDER — HYDROMORPHONE HCL 4 MG/ML IJ SOLN
INTRAMUSCULAR | Status: AC
  Filled 2016-07-17: qty 1

## 2016-07-17 NOTE — Progress Notes (Signed)
Nutrition follow-up completed with patient during IV fluids for tonsil cancer. Patient reports he prefers using bolus tube feeding instead of continuous feeding. Osmolite 1.5 one half cans 4 times a day with 120 cc of water before and after bolus feedings. In addition, he gives 240 cc free water 4 times a day. Patient has decided he does not want continuous tube feeding at this time but wants to keep his tube feeding pump for the time being. Current weight 145.8 pounds  Estimated nutrition needs: 2460-2660 calories, 95-110 grams protein, 2.6 L free water.  Nutrition diagnosis: Inadequate oral intake continues.  Intervention:  Patient should continue to give 6 cans Osmolite 1.5 daily, either bolus or continuous feeding as tolerated. Patient should continue free water flushes. Encouraged patient to try to start drinking fluids when able.  Tube feeding provides 2485 cal, 104 g protein, 2467 cc free water.  Monitoring, evaluation, goals: Patient will tolerate tube feeding at goal rate to minimize weight loss.  Next visit: To be scheduled as needed.  **Disclaimer: This note was dictated with voice recognition software. Similar sounding words can inadvertently be transcribed and this note may contain transcription errors which may not have been corrected upon publication of note.**

## 2016-07-17 NOTE — Patient Instructions (Signed)

## 2016-07-18 ENCOUNTER — Ambulatory Visit (HOSPITAL_BASED_OUTPATIENT_CLINIC_OR_DEPARTMENT_OTHER): Payer: No Typology Code available for payment source

## 2016-07-18 ENCOUNTER — Ambulatory Visit (HOSPITAL_BASED_OUTPATIENT_CLINIC_OR_DEPARTMENT_OTHER): Payer: No Typology Code available for payment source | Admitting: Hematology and Oncology

## 2016-07-18 VITALS — BP 120/78 | HR 63 | Temp 98.2°F | Resp 16

## 2016-07-18 DIAGNOSIS — G893 Neoplasm related pain (acute) (chronic): Secondary | ICD-10-CM

## 2016-07-18 DIAGNOSIS — E440 Moderate protein-calorie malnutrition: Secondary | ICD-10-CM

## 2016-07-18 DIAGNOSIS — T451X5A Adverse effect of antineoplastic and immunosuppressive drugs, initial encounter: Secondary | ICD-10-CM

## 2016-07-18 DIAGNOSIS — C090 Malignant neoplasm of tonsillar fossa: Secondary | ICD-10-CM

## 2016-07-18 DIAGNOSIS — R110 Nausea: Secondary | ICD-10-CM

## 2016-07-18 MED ORDER — HYDROMORPHONE HCL 4 MG/ML IJ SOLN
INTRAMUSCULAR | Status: AC
  Filled 2016-07-18: qty 1

## 2016-07-18 MED ORDER — HYDROMORPHONE HCL 4 MG/ML IJ SOLN
2.0000 mg | INTRAMUSCULAR | Status: DC | PRN
Start: 2016-07-18 — End: 2016-07-18
  Administered 2016-07-18: 1 mg via INTRAVENOUS

## 2016-07-18 MED ORDER — ONDANSETRON HCL 4 MG/2ML IJ SOLN
INTRAMUSCULAR | Status: AC
  Filled 2016-07-18: qty 4

## 2016-07-18 MED ORDER — SODIUM CHLORIDE 0.9 % IV SOLN
Freq: Once | INTRAVENOUS | Status: AC
  Administered 2016-07-18: 08:00:00 via INTRAVENOUS

## 2016-07-18 MED ORDER — HEPARIN SOD (PORK) LOCK FLUSH 100 UNIT/ML IV SOLN
500.0000 [IU] | Freq: Once | INTRAVENOUS | Status: AC | PRN
  Administered 2016-07-18: 500 [IU]
  Filled 2016-07-18: qty 5

## 2016-07-18 MED ORDER — PROMETHAZINE HCL 25 MG/ML IJ SOLN
12.5000 mg | Freq: Once | INTRAMUSCULAR | Status: DC
  Filled 2016-07-18: qty 1

## 2016-07-18 MED ORDER — SODIUM CHLORIDE 0.9 % IV SOLN: Freq: Once | INTRAVENOUS | Status: DC

## 2016-07-18 MED ORDER — ONDANSETRON HCL 4 MG/2ML IJ SOLN
8.0000 mg | Freq: Once | INTRAMUSCULAR | Status: AC
  Administered 2016-07-18: 8 mg via INTRAVENOUS

## 2016-07-18 MED ORDER — SODIUM CHLORIDE 0.9 % IJ SOLN
10.0000 mL | INTRAMUSCULAR | Status: DC | PRN
  Administered 2016-07-18: 10 mL
  Filled 2016-07-18: qty 10

## 2016-07-18 NOTE — Progress Notes (Signed)
Ok to treat with CMET results from today VO Dr. Dian Situ RN

## 2016-07-19 ENCOUNTER — Other Ambulatory Visit: Payer: Self-pay | Admitting: Cardiovascular Disease

## 2016-07-19 ENCOUNTER — Ambulatory Visit (HOSPITAL_COMMUNITY): Payer: No Typology Code available for payment source

## 2016-07-19 ENCOUNTER — Other Ambulatory Visit: Payer: Self-pay | Admitting: Cardiology

## 2016-07-19 ENCOUNTER — Ambulatory Visit (HOSPITAL_BASED_OUTPATIENT_CLINIC_OR_DEPARTMENT_OTHER): Payer: No Typology Code available for payment source

## 2016-07-19 ENCOUNTER — Encounter (HOSPITAL_COMMUNITY): Payer: Self-pay

## 2016-07-19 ENCOUNTER — Encounter: Payer: Self-pay | Admitting: Hematology and Oncology

## 2016-07-19 VITALS — BP 119/80 | HR 58 | Temp 98.3°F

## 2016-07-19 DIAGNOSIS — C090 Malignant neoplasm of tonsillar fossa: Secondary | ICD-10-CM

## 2016-07-19 DIAGNOSIS — R079 Chest pain, unspecified: Secondary | ICD-10-CM

## 2016-07-19 DIAGNOSIS — G893 Neoplasm related pain (acute) (chronic): Secondary | ICD-10-CM

## 2016-07-19 MED ORDER — ONDANSETRON HCL 4 MG/2ML IJ SOLN
8.0000 mg | Freq: Once | INTRAMUSCULAR | Status: AC
  Administered 2016-07-19: 8 mg via INTRAVENOUS

## 2016-07-19 MED ORDER — HYDROMORPHONE HCL 4 MG/ML IJ SOLN
INTRAMUSCULAR | Status: AC
  Filled 2016-07-19: qty 1

## 2016-07-19 MED ORDER — PROMETHAZINE HCL 25 MG/ML IJ SOLN
12.5000 mg | Freq: Once | INTRAMUSCULAR | Status: DC
  Filled 2016-07-19: qty 1

## 2016-07-19 MED ORDER — SODIUM CHLORIDE 0.9 % IV SOLN: Freq: Once | INTRAVENOUS | Status: DC

## 2016-07-19 MED ORDER — ONDANSETRON HCL 4 MG/2ML IJ SOLN
INTRAMUSCULAR | Status: AC
  Filled 2016-07-19: qty 4

## 2016-07-19 MED ORDER — HEPARIN SOD (PORK) LOCK FLUSH 100 UNIT/ML IV SOLN
500.0000 [IU] | Freq: Once | INTRAVENOUS | Status: AC | PRN
  Administered 2016-07-19: 500 [IU]
  Filled 2016-07-19: qty 5

## 2016-07-19 MED ORDER — HYDROMORPHONE HCL 4 MG/ML IJ SOLN
2.0000 mg | INTRAMUSCULAR | Status: DC | PRN
  Administered 2016-07-19: 2 mg via INTRAVENOUS

## 2016-07-19 MED ORDER — SODIUM CHLORIDE 0.9 % IJ SOLN
10.0000 mL | INTRAMUSCULAR | Status: DC | PRN
  Administered 2016-07-19: 10 mL
  Filled 2016-07-19: qty 10

## 2016-07-19 MED ORDER — SODIUM CHLORIDE 0.9 % IV SOLN
Freq: Once | INTRAVENOUS | Status: AC
  Administered 2016-07-19: 09:00:00 via INTRAVENOUS

## 2016-07-19 NOTE — Assessment & Plan Note (Signed)
He has stopped all his pain medicine. I encouraged him to take pain medicine as needed

## 2016-07-19 NOTE — Assessment & Plan Note (Signed)
He is doing better. I encouraged him to eat by mouth. Next week, I plan to reduce the frequency of his IV fluid

## 2016-07-19 NOTE — Patient Instructions (Signed)
Dehydration, Adult Dehydration is a condition in which there is not enough fluid or water in the body. This happens when you lose more fluids than you take in. Important organs, such as the kidneys, brain, and heart, cannot function without a proper amount of fluids. Any loss of fluids from the body can lead to dehydration. Dehydration can range from mild to severe. This condition should be treated right away to prevent it from becoming severe. What are the causes? This condition may be caused by:  Vomiting.  Diarrhea.  Excessive sweating, such as from heat exposure or exercise.  Not drinking enough fluid, especially:  When ill.  While doing activity that requires a lot of energy.  Excessive urination.  Fever.  Infection.  Certain medicines, such as medicines that cause the body to lose excess fluid (diuretics).  Inability to access safe drinking water.  Reduced physical ability to get adequate water and food. What increases the risk? This condition is more likely to develop in people:  Who have a poorly controlled long-term (chronic) illness, such as diabetes, heart disease, or kidney disease.  Who are age 65 or older.  Who are disabled.  Who live in a place with high altitude.  Who play endurance sports. What are the signs or symptoms? Symptoms of mild dehydration may include:   Thirst.  Dry lips.  Slightly dry mouth.  Dry, warm skin.  Dizziness. Symptoms of moderate dehydration may include:   Very dry mouth.  Muscle cramps.  Dark urine. Urine may be the color of tea.  Decreased urine production.  Decreased tear production.  Heartbeat that is irregular or faster than normal (palpitations).  Headache.  Light-headedness, especially when you stand up from a sitting position.  Fainting (syncope). Symptoms of severe dehydration may include:   Changes in skin, such as:  Cold and clammy skin.  Blotchy (mottled) or pale skin.  Skin that does  not quickly return to normal after being lightly pinched and released (poor skin turgor).  Changes in body fluids, such as:  Extreme thirst.  No tear production.  Inability to sweat when body temperature is high, such as in hot weather.  Very little urine production.  Changes in vital signs, such as:  Weak pulse.  Pulse that is more than 100 beats a minute when sitting still.  Rapid breathing.  Low blood pressure.  Other changes, such as:  Sunken eyes.  Cold hands and feet.  Confusion.  Lack of energy (lethargy).  Difficulty waking up from sleep.  Short-term weight loss.  Unconsciousness. How is this diagnosed? This condition is diagnosed based on your symptoms and a physical exam. Blood and urine tests may be done to help confirm the diagnosis. How is this treated? Treatment for this condition depends on the severity. Mild or moderate dehydration can often be treated at home. Treatment should be started right away. Do not wait until dehydration becomes severe. Severe dehydration is an emergency and it needs to be treated in a hospital. Treatment for mild dehydration may include:   Drinking more fluids.  Replacing salts and minerals in your blood (electrolytes) that you may have lost. Treatment for moderate dehydration may include:   Drinking an oral rehydration solution (ORS). This is a drink that helps you replace fluids and electrolytes (rehydrate). It can be found at pharmacies and retail stores. Treatment for severe dehydration may include:   Receiving fluids through an IV tube.  Receiving an electrolyte solution through a feeding tube that is   passed through your nose and into your stomach (nasogastric tube, or NG tube).  Correcting any abnormalities in electrolytes.  Treating the underlying cause of dehydration. Follow these instructions at home:  If directed by your health care provider, drink an ORS:  Make an ORS by following instructions on the  package.  Start by drinking small amounts, about  cup (120 mL) every 5-10 minutes.  Slowly increase how much you drink until you have taken the amount recommended by your health care provider.  Drink enough clear fluid to keep your urine clear or pale yellow. If you were told to drink an ORS, finish the ORS first, then start slowly drinking other clear fluids. Drink fluids such as:  Water. Do not drink only water. Doing that can lead to having too little salt (sodium) in the body (hyponatremia).  Ice chips.  Fruit juice that you have added water to (diluted fruit juice).  Low-calorie sports drinks.  Avoid:  Alcohol.  Drinks that contain a lot of sugar. These include high-calorie sports drinks, fruit juice that is not diluted, and soda.  Caffeine.  Foods that are greasy or contain a lot of fat or sugar.  Take over-the-counter and prescription medicines only as told by your health care provider.  Do not take sodium tablets. This can lead to having too much sodium in the body (hypernatremia).  Eat foods that contain a healthy balance of electrolytes, such as bananas, oranges, potatoes, tomatoes, and spinach.  Keep all follow-up visits as told by your health care provider. This is important. Contact a health care provider if:  You have abdominal pain that:  Gets worse.  Stays in one area (localizes).  You have a rash.  You have a stiff neck.  You are more irritable than usual.  You are sleepier or more difficult to wake up than usual.  You feel weak or dizzy.  You feel very thirsty.  You have urinated only a small amount of very dark urine over 6-8 hours. Get help right away if:  You have symptoms of severe dehydration.  You cannot drink fluids without vomiting.  Your symptoms get worse with treatment.  You have a fever.  You have a severe headache.  You have vomiting or diarrhea that:  Gets worse.  Does not go away.  You have blood or green matter  (bile) in your vomit.  You have blood in your stool. This may cause stool to look black and tarry.  You have not urinated in 6-8 hours.  You faint.  Your heart rate while sitting still is over 100 beats a minute.  You have trouble breathing. This information is not intended to replace advice given to you by your health care provider. Make sure you discuss any questions you have with your health care provider. Document Released: 08/21/2005 Document Revised: 03/17/2016 Document Reviewed: 10/15/2015 [employer]vier Interactive Patient Education  2017 [employer]  

## 2016-07-19 NOTE — Assessment & Plan Note (Signed)
This is resolved. I encouraged him to try to eat by mouth

## 2016-07-19 NOTE — Progress Notes (Signed)
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 Chronic small-vessel ischemic changes of the cerebral hemispheric white matter.       04/04/2016 Imaging    CT neck showed left tonsillar mass with diameter of 19 x 24 mm consistent with 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 necrosis. Suspicious level 5/supraclavicular nodes on the left. Scar type density in the right upper lobe is more prominent than was seen on a CT scan of the chest 10/06/2014      04/21/2016 PET scan    Asymmetric hypermetabolic soft tissue prominence in left palatine tonsil, consistent with known primary left tonsillar squamous cell Carcinoma. Left level 2 cervical lymphadenopathy, consistent with metastatic disease. No evidence of metastatic disease within the chest, abdomen, or pelvis. Stable 14 mm ground-glass and part solid nodular opacity in right lung apex compared to previous CT in 2016. This shows low-grade metabolic activity. Low-grade bronchogenic adenocarcinoma cannot be excluded. Consider surgical resection versus continued followup by CT in 12 months      05/17/2016 Procedure    Successful placement of a 20 French pull  through gastrostomy tube & right IJ approach [place].      05/22/2016 - 07/03/2016 Chemotherapy    He received 3 doses of high dose cisplatin with radiation       05/23/2016 -  Radiation Therapy    He received concurrent radiation therapy       INTERVAL HISTORY: Please see below for problem oriented charting. He is seen in the infusion room. He feels better. He has stopped taking pain medicine. Denies nausea vomiting. He tolerated nutritional feeding well. Denies constipation. He has not tried to eat anything by mouth yet  REVIEW OF SYSTEMS:   Constitutional: Denies fevers, chills or abnormal weight loss Eyes: Denies blurriness of vision Ears, nose, mouth, throat, and face: Denies mucositis or sore throat Respiratory: Denies cough, dyspnea or wheezes Cardiovascular: Denies palpitation, chest discomfort or lower extremity swelling Gastrointestinal:  Denies nausea, heartburn or change in bowel habits Skin: Denies abnormal skin rashes Lymphatics: Denies new lymphadenopathy or easy bruising Neurological:Denies numbness, tingling or new weaknesses Behavioral/Psych: Mood is stable, no new changes  All other systems were reviewed with the patient and are negative.  I have reviewed the past medical history, past surgical history, social history and family history with the patient and they are unchanged from previous note.  ALLERGIES:  has No Known Allergies.  MEDICATIONS:  Current Outpatient Prescriptions  Medication Sig Dispense Refill  . Ethyl Alcohol, Skin Cleanser, (PURELL INSTANT HAND SANITIZER) 65 % FOAM Use as needed to sanitize hands 160 mL 5  . fentaNYL (DURAGESIC - DOSED MCG/HR) 25 MCG/HR patch Place 1 patch (25 mcg total) onto the skin every  3 (three) days. (Patient not taking: Reported on 07/10/2016) 5 patch 0  . lidocaine (XYLOCAINE) 2 % solution Patient: Mix 1part 2% viscous lidocaine, 1part H20. Swallow 20mL of this mixture, 36min before meals and at bedtime, up to  QID PRN soreness (Patient not taking: Reported on 07/10/2016) 100 mL 5  . lidocaine-prilocaine (EMLA) cream Apply to affected area once 30 g 3  . LORazepam (ATIVAN) 0.5 MG tablet Take 1 tablet up to BID PRN nausea or anxiety.  Can be taken 30 min before radiotherapy for anxiety. 50 tablet 0  . Morphine Sulfate (MORPHINE CONCENTRATE) 10 mg / 0.5 ml concentrated solution Take 0.5 mLs (10 mg total) by mouth every 2 (two) hours as needed for severe pain. 240 mL 0  . Nutritional Supplements (FEEDING SUPPLEMENT, OSMOLITE 1.5 CAL,) LIQD Begin Osmolite 1.5 via tube at 60 ml/hr from 4pm -6am as tolerated. Increase 10 cc every 4 hours as tolerated to goal of 120 cc/hr. Flush feeding tube with 120 cc free water before and after continuous feeding. Give 240 cc free water 4 times daily at  8 am,10 am, 12 noon and 2 pm. 1659 mL 0  . ondansetron (ZOFRAN) 8 MG tablet Take 1 tablet (8 mg total) by mouth every 8 (eight) hours as needed. Start on the third day after chemotherapy. 60 tablet 1  . polyethylene glycol (MIRALAX) packet Take 17 g by mouth daily. 14 each 9  . prochlorperazine (COMPAZINE) 10 MG tablet Take 1 tablet (10 mg total) by mouth every 6 (six) hours as needed (Nausea or vomiting). 90 tablet 3  . sodium fluoride (FLUORISHIELD) 1.1 % GEL dental gel Instill one drop of gel per tooth space of fluoride tray. Place over teeth for 5 minutes. Remove. Spit out excess. Repeat nightly. 120 mL prn  . sucralfate (CARAFATE) 1 g tablet Dissolve 1 tablet in 10 mL H20 and swallow up to QID PRN sore throat (Patient not taking: Reported on 07/10/2016) 40 tablet 5   Current Facility-Administered Medications  Medication Dose Route Frequency Provider Last Rate Last Dose  . dexamethasone (DECADRON) injection 10 mg  10 mg Intravenous Once Heath Lark, MD      . LORazepam (ATIVAN) injection 0.5 mg  0.5 mg Intravenous Once Heath Lark, MD       Facility-Administered Medications Ordered in Other Visits  Medication Dose Route  Frequency Provider Last Rate Last Dose  . HYDROmorphone (DILAUDID) injection 2 mg  2 mg Intravenous Q2H PRN Heath Lark, MD   2 mg at 07/19/16 0902  . promethazine (PHENERGAN) injection 12.5 mg  12.5 mg Intravenous Once Heath Lark, MD      . sodium chloride 0.9 % injection 10 mL  10 mL Intracatheter PRN Heath Lark, MD   10 mL at 07/19/16 1057  . sodium chloride flush (NS) 0.9 % injection 10 mL  10 mL Intracatheter PRN Heath Lark, MD   10 mL at 05/24/16 1615    PHYSICAL EXAMINATION: ECOG PERFORMANCE STATUS: 1 - Symptomatic but completely ambulatory  Vitals:   07/18/16 0836  BP: 120/78  Pulse: 63  Resp: 16  Temp: 98.2 F (36.8 C)   There were no vitals filed for this visit.  GENERAL:alert, no distress and comfortable SKIN: skin color, texture, turgor are normal, no rashes or significant lesions EYES: normal, Conjunctiva are pink and non-injected, sclera clear OROPHARYNX:no exudate, no erythema and lips, buccal mucosa, and tongue normal  NECK: supple, thyroid normal size, non-tender, without nodularity LYMPH:  no palpable lymphadenopathy  in the cervical, axillary or inguinal LUNGS: clear to auscultation and percussion with normal breathing effort HEART: regular rate & rhythm and no murmurs and no lower extremity edema ABDOMEN:abdomen soft, non-tender and normal bowel sounds Musculoskeletal:no cyanosis of digits and no clubbing  NEURO: alert & oriented x 3 with fluent speech, no focal motor/sensory deficits  LABORATORY DATA:  I have reviewed the data as listed    Component Value Date/Time   NA 139 06/30/2016 0914   K 4.7 06/30/2016 0914   CL 105 05/17/2016 1250   CO2 28 06/30/2016 0914   GLUCOSE 74 06/30/2016 0914   BUN 17.7 06/30/2016 0914   CREATININE 0.8 06/30/2016 0914   CALCIUM 10.0 06/30/2016 0914   PROT 7.7 06/30/2016 0914   ALBUMIN 3.6 06/30/2016 0914   AST 21 06/30/2016 0914   ALT 18 06/30/2016 0914   ALKPHOS 81 06/30/2016 0914   BILITOT <0.22 06/30/2016 0914    GFRNONAA >60 05/17/2016 1250   GFRAA >60 05/17/2016 1250    No results found for: SPEP, UPEP  Lab Results  Component Value Date   WBC 2.0 (L) 06/30/2016   NEUTROABS 1.4 (L) 06/30/2016   HGB 12.4 (L) 06/30/2016   HCT 35.4 (L) 06/30/2016   MCV 89.4 06/30/2016   PLT 114 (L) 06/30/2016      Chemistry      Component Value Date/Time   NA 139 06/30/2016 0914   K 4.7 06/30/2016 0914   CL 105 05/17/2016 1250   CO2 28 06/30/2016 0914   BUN 17.7 06/30/2016 0914   CREATININE 0.8 06/30/2016 0914      Component Value Date/Time   CALCIUM 10.0 06/30/2016 0914   ALKPHOS 81 06/30/2016 0914   AST 21 06/30/2016 0914   ALT 18 06/30/2016 0914   BILITOT <0.22 06/30/2016 0914      ASSESSMENT & PLAN:  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HCC) He tolerated treatment well with worsening expected side effects. He will continue to be seen on a weekly basis for supportive care  Cancer associated pain He has stopped all his pain medicine. I encouraged him to take pain medicine as needed  Chemotherapy-induced nausea This is resolved. I encouraged him to try to eat by mouth  Protein-calorie malnutrition, moderate ([place]) He is doing better. I encouraged him to eat by mouth. Next week, I plan to reduce the frequency of his IV fluid   No orders of the defined types were placed in this encounter.  All questions were answered. The patient knows to call the clinic with any problems, questions or concerns. No barriers to learning was detected. I spent 15 minutes counseling the patient face to face. The total time spent in the appointment was 20 minutes and more than 50% was on counseling and review of test results     Heath Lark, MD 07/19/2016 11:01 AM

## 2016-07-19 NOTE — Assessment & Plan Note (Signed)
He tolerated treatment well with worsening expected side effects. He will continue to be seen on a weekly basis for supportive care

## 2016-07-20 ENCOUNTER — Ambulatory Visit (HOSPITAL_BASED_OUTPATIENT_CLINIC_OR_DEPARTMENT_OTHER): Payer: No Typology Code available for payment source

## 2016-07-20 VITALS — BP 132/87 | HR 58 | Temp 98.5°F | Resp 16

## 2016-07-20 DIAGNOSIS — C090 Malignant neoplasm of tonsillar fossa: Secondary | ICD-10-CM

## 2016-07-20 DIAGNOSIS — R110 Nausea: Secondary | ICD-10-CM

## 2016-07-20 MED ORDER — SODIUM CHLORIDE 0.9 % IV SOLN: Freq: Once | INTRAVENOUS | Status: DC

## 2016-07-20 MED ORDER — ONDANSETRON HCL 4 MG/2ML IJ SOLN
INTRAMUSCULAR | Status: AC
  Filled 2016-07-20: qty 4

## 2016-07-20 MED ORDER — ONDANSETRON HCL 4 MG/2ML IJ SOLN
8.0000 mg | Freq: Once | INTRAMUSCULAR | Status: AC
  Administered 2016-07-20: 8 mg via INTRAVENOUS

## 2016-07-20 MED ORDER — SODIUM CHLORIDE 0.9 % IV SOLN
Freq: Once | INTRAVENOUS | Status: AC
  Administered 2016-07-20: 08:00:00 via INTRAVENOUS

## 2016-07-20 MED ORDER — SODIUM CHLORIDE 0.9 % IJ SOLN
10.0000 mL | INTRAMUSCULAR | Status: DC | PRN
  Administered 2016-07-20: 10 mL
  Filled 2016-07-20: qty 10

## 2016-07-20 MED ORDER — HEPARIN SOD (PORK) LOCK FLUSH 100 UNIT/ML IV SOLN
500.0000 [IU] | Freq: Once | INTRAVENOUS | Status: AC | PRN
  Administered 2016-07-20: 500 [IU]
  Filled 2016-07-20: qty 5

## 2016-07-20 NOTE — Patient Instructions (Signed)

## 2016-07-21 ENCOUNTER — Other Ambulatory Visit: Payer: Self-pay | Admitting: *Deleted

## 2016-07-21 ENCOUNTER — Ambulatory Visit (HOSPITAL_BASED_OUTPATIENT_CLINIC_OR_DEPARTMENT_OTHER): Payer: No Typology Code available for payment source

## 2016-07-21 VITALS — BP 133/81 | HR 62 | Temp 98.2°F | Resp 16

## 2016-07-21 DIAGNOSIS — R110 Nausea: Secondary | ICD-10-CM

## 2016-07-21 DIAGNOSIS — C090 Malignant neoplasm of tonsillar fossa: Secondary | ICD-10-CM

## 2016-07-21 MED ORDER — SODIUM CHLORIDE 0.9 % IV SOLN
Freq: Once | INTRAVENOUS | Status: AC
  Administered 2016-07-21: 09:00:00 via INTRAVENOUS

## 2016-07-21 MED ORDER — ONDANSETRON HCL 4 MG/2ML IJ SOLN
8.0000 mg | Freq: Once | INTRAMUSCULAR | Status: AC
  Administered 2016-07-21: 8 mg via INTRAVENOUS

## 2016-07-21 MED ORDER — SODIUM CHLORIDE 0.9 % IV SOLN: Freq: Once | INTRAVENOUS | Status: DC

## 2016-07-21 MED ORDER — HEPARIN SOD (PORK) LOCK FLUSH 100 UNIT/ML IV SOLN
500.0000 [IU] | Freq: Once | INTRAVENOUS | Status: AC | PRN
  Administered 2016-07-21: 500 [IU]
  Filled 2016-07-21: qty 5

## 2016-07-21 MED ORDER — SODIUM CHLORIDE 0.9 % IJ SOLN
10.0000 mL | INTRAMUSCULAR | Status: DC | PRN
  Administered 2016-07-21: 10 mL
  Filled 2016-07-21: qty 10

## 2016-07-21 MED ORDER — ONDANSETRON HCL 4 MG/2ML IJ SOLN
INTRAMUSCULAR | Status: AC
  Filled 2016-07-21: qty 4

## 2016-07-21 NOTE — Patient Instructions (Signed)
Dehydration, Adult Dehydration is when there is not enough fluid or water in your body. This happens when you lose more fluids than you take in. Dehydration can range from mild to very bad. It should be treated right away to keep it from getting very bad. Symptoms of mild dehydration may include:   Thirst.  Dry lips.  Slightly dry mouth.  Dry, warm skin.  Dizziness. Symptoms of moderate dehydration may include:   Very dry mouth.  Muscle cramps.  Dark pee (urine). Pee may be the color of tea.  Your body making less pee.  Your eyes making fewer tears.  Heartbeat that is uneven or faster than normal (palpitations).  Headache.  Light-headedness, especially when you stand up from sitting.  Fainting (syncope). Symptoms of very bad dehydration may include:   Changes in skin, such as:  Cold and clammy skin.  Blotchy (mottled) or pale skin.  Skin that does not quickly return to normal after being lightly pinched and let go (poor skin turgor).  Changes in body fluids, such as:  Feeling very thirsty.  Your eyes making fewer tears.  Not sweating when body temperature is high, such as in hot weather.  Your body making very little pee.  Changes in vital signs, such as:  Weak pulse.  Pulse that is more than 100 beats a minute when you are sitting still.  Fast breathing.  Low blood pressure.  Other changes, such as:  Sunken eyes.  Cold hands and feet.  Confusion.  Lack of energy (lethargy).  Trouble waking up from sleep.  Short-term weight loss.  Unconsciousness. Follow these instructions at home:  If told by your doctor, drink an ORS:  Make an ORS by using instructions on the package.  Start by drinking small amounts, about  cup (120 mL) every 5-10 minutes.  Slowly drink more until you have had the amount that your doctor said to have.  Drink enough clear fluid to keep your pee clear or pale yellow. If you were told to drink an ORS, finish the  ORS first, then start slowly drinking clear fluids. Drink fluids such as:  Water. Do not drink only water by itself. Doing that can make the salt (sodium) level in your body get too low (hyponatremia).  Ice chips.  Fruit juice that you have added water to (diluted).  Low-calorie sports drinks.  Avoid:  Alcohol.  Drinks that have a lot of sugar. These include high-calorie sports drinks, fruit juice that does not have water added, and soda.  Caffeine.  Foods that are greasy or have a lot of fat or sugar.  Take over-the-counter and prescription medicines only as told by your doctor.  Do not take salt tablets. Doing that can make the salt level in your body get too high (hypernatremia).  Eat foods that have minerals (electrolytes). Examples include bananas, oranges, potatoes, tomatoes, and spinach.  Keep all follow-up visits as told by your doctor. This is important. Contact a doctor if:  You have belly (abdominal) pain that:  Gets worse.  Stays in one area (localizes).  You have a rash.  You have a stiff neck.  You get angry or annoyed more easily than normal (irritability).  You are more sleepy than normal.  You have a harder time waking up than normal.  You feel:  Weak.  Dizzy.  Very thirsty.  You have peed (urinated) only a small amount of very dark pee during 6-8 hours. Get help right away if:  You   have symptoms of very bad dehydration.  You cannot drink fluids without throwing up (vomiting).  Your symptoms get worse with treatment.  You have a fever.  You have a very bad headache.  You are throwing up or having watery poop (diarrhea) and it:  Gets worse.  Does not go away.  You have blood or something green (bile) in your throw-up.  You have blood in your poop (stool). This may cause poop to look black and tarry.  You have not peed in 6-8 hours.  You pass out (faint).  Your heart rate when you are sitting still is more than 100 beats a  minute.  You have trouble breathing. This information is not intended to replace advice given to you by your health care provider. Make sure you discuss any questions you have with your health care provider. Document Released: 06/17/2009 Document Revised: 03/10/2016 Document Reviewed: 10/15/2015 Elsevier Interactive Patient Education  2017 [employer]  

## 2016-07-22 ENCOUNTER — Ambulatory Visit (HOSPITAL_BASED_OUTPATIENT_CLINIC_OR_DEPARTMENT_OTHER): Payer: No Typology Code available for payment source

## 2016-07-22 VITALS — BP 138/88 | HR 72 | Temp 98.5°F | Resp 16

## 2016-07-22 DIAGNOSIS — C090 Malignant neoplasm of tonsillar fossa: Secondary | ICD-10-CM

## 2016-07-22 MED ORDER — PROMETHAZINE HCL 25 MG/ML IJ SOLN: 12.5000 mg | Freq: Once | INTRAMUSCULAR | Status: DC

## 2016-07-22 MED ORDER — SODIUM CHLORIDE 0.9 % IV SOLN
Freq: Once | INTRAVENOUS | Status: AC
  Administered 2016-07-22: 09:00:00 via INTRAVENOUS

## 2016-07-22 MED ORDER — HEPARIN SOD (PORK) LOCK FLUSH 100 UNIT/ML IV SOLN
250.0000 [IU] | Freq: Once | INTRAVENOUS | Status: DC | PRN
  Filled 2016-07-22: qty 5

## 2016-07-22 MED ORDER — HYDROMORPHONE HCL 4 MG/ML IJ SOLN
2.0000 mg | INTRAMUSCULAR | Status: DC | PRN
  Administered 2016-07-22: 2 mg via INTRAVENOUS

## 2016-07-22 MED ORDER — SODIUM CHLORIDE 0.9 % IJ SOLN
10.0000 mL | INTRAMUSCULAR | Status: DC | PRN
  Administered 2016-07-22: 10 mL
  Filled 2016-07-22: qty 10

## 2016-07-22 MED ORDER — HYDROMORPHONE HCL 4 MG/ML IJ SOLN
INTRAMUSCULAR | Status: AC
  Filled 2016-07-22: qty 1

## 2016-07-22 MED ORDER — HEPARIN SOD (PORK) LOCK FLUSH 100 UNIT/ML IV SOLN
500.0000 [IU] | Freq: Once | INTRAVENOUS | Status: AC | PRN
Start: 2016-07-22 — End: 2016-07-22
  Administered 2016-07-22: 500 [IU]
  Filled 2016-07-22: qty 5

## 2016-07-22 MED ORDER — ALTEPLASE 2 MG IJ SOLR
2.0000 mg | Freq: Once | INTRAMUSCULAR | Status: DC | PRN
  Filled 2016-07-22: qty 2

## 2016-07-22 MED ORDER — ONDANSETRON HCL 8 MG PO TABS
ORAL_TABLET | ORAL | Status: AC
  Filled 2016-07-22: qty 1

## 2016-07-22 NOTE — Patient Instructions (Signed)
Dehydration, Adult Dehydration is when there is not enough fluid or water in your body. This happens when you lose more fluids than you take in. Dehydration can range from mild to very bad. It should be treated right away to keep it from getting very bad. Symptoms of mild dehydration may include:   Thirst.  Dry lips.  Slightly dry mouth.  Dry, warm skin.  Dizziness. Symptoms of moderate dehydration may include:   Very dry mouth.  Muscle cramps.  Dark pee (urine). Pee may be the color of tea.  Your body making less pee.  Your eyes making fewer tears.  Heartbeat that is uneven or faster than normal (palpitations).  Headache.  Light-headedness, especially when you stand up from sitting.  Fainting (syncope). Symptoms of very bad dehydration may include:   Changes in skin, such as:  Cold and clammy skin.  Blotchy (mottled) or pale skin.  Skin that does not quickly return to normal after being lightly pinched and let go (poor skin turgor).  Changes in body fluids, such as:  Feeling very thirsty.  Your eyes making fewer tears.  Not sweating when body temperature is high, such as in hot weather.  Your body making very little pee.  Changes in vital signs, such as:  Weak pulse.  Pulse that is more than 100 beats a minute when you are sitting still.  Fast breathing.  Low blood pressure.  Other changes, such as:  Sunken eyes.  Cold hands and feet.  Confusion.  Lack of energy (lethargy).  Trouble waking up from sleep.  Short-term weight loss.  Unconsciousness. Follow these instructions at home:  If told by your doctor, drink an ORS:  Make an ORS by using instructions on the package.  Start by drinking small amounts, about  cup (120 mL) every 5-10 minutes.  Slowly drink more until you have had the amount that your doctor said to have.  Drink enough clear fluid to keep your pee clear or pale yellow. If you were told to drink an ORS, finish the  ORS first, then start slowly drinking clear fluids. Drink fluids such as:  Water. Do not drink only water by itself. Doing that can make the salt (sodium) level in your body get too low (hyponatremia).  Ice chips.  Fruit juice that you have added water to (diluted).  Low-calorie sports drinks.  Avoid:  Alcohol.  Drinks that have a lot of sugar. These include high-calorie sports drinks, fruit juice that does not have water added, and soda.  Caffeine.  Foods that are greasy or have a lot of fat or sugar.  Take over-the-counter and prescription medicines only as told by your doctor.  Do not take salt tablets. Doing that can make the salt level in your body get too high (hypernatremia).  Eat foods that have minerals (electrolytes). Examples include bananas, oranges, potatoes, tomatoes, and spinach.  Keep all follow-up visits as told by your doctor. This is important. Contact a doctor if:  You have belly (abdominal) pain that:  Gets worse.  Stays in one area (localizes).  You have a rash.  You have a stiff neck.  You get angry or annoyed more easily than normal (irritability).  You are more sleepy than normal.  You have a harder time waking up than normal.  You feel:  Weak.  Dizzy.  Very thirsty.  You have peed (urinated) only a small amount of very dark pee during 6-8 hours. Get help right away if:  You   have symptoms of very bad dehydration.  You cannot drink fluids without throwing up (vomiting).  Your symptoms get worse with treatment.  You have a fever.  You have a very bad headache.  You are throwing up or having watery poop (diarrhea) and it:  Gets worse.  Does not go away.  You have blood or something green (bile) in your throw-up.  You have blood in your poop (stool). This may cause poop to look black and tarry.  You have not peed in 6-8 hours.  You pass out (faint).  Your heart rate when you are sitting still is more than 100 beats a  minute.  You have trouble breathing. This information is not intended to replace advice given to you by your health care provider. Make sure you discuss any questions you have with your health care provider. Document Released: 06/17/2009 Document Revised: 03/10/2016 Document Reviewed: 10/15/2015 Elsevier Interactive Patient Education  2017 [employer]  

## 2016-07-24 ENCOUNTER — Encounter: Payer: Self-pay | Admitting: Hematology and Oncology

## 2016-07-24 ENCOUNTER — Telehealth: Payer: Self-pay | Admitting: *Deleted

## 2016-07-24 ENCOUNTER — Other Ambulatory Visit (HOSPITAL_BASED_OUTPATIENT_CLINIC_OR_DEPARTMENT_OTHER): Payer: No Typology Code available for payment source

## 2016-07-24 ENCOUNTER — Ambulatory Visit (HOSPITAL_BASED_OUTPATIENT_CLINIC_OR_DEPARTMENT_OTHER): Payer: No Typology Code available for payment source | Admitting: Nurse Practitioner

## 2016-07-24 ENCOUNTER — Telehealth: Payer: Self-pay | Admitting: Hematology and Oncology

## 2016-07-24 ENCOUNTER — Ambulatory Visit (HOSPITAL_BASED_OUTPATIENT_CLINIC_OR_DEPARTMENT_OTHER): Payer: No Typology Code available for payment source | Admitting: Hematology and Oncology

## 2016-07-24 DIAGNOSIS — D61818 Other pancytopenia: Secondary | ICD-10-CM

## 2016-07-24 DIAGNOSIS — R042 Hemoptysis: Secondary | ICD-10-CM

## 2016-07-24 DIAGNOSIS — T451X5A Adverse effect of antineoplastic and immunosuppressive drugs, initial encounter: Secondary | ICD-10-CM

## 2016-07-24 DIAGNOSIS — C090 Malignant neoplasm of tonsillar fossa: Secondary | ICD-10-CM

## 2016-07-24 DIAGNOSIS — Z931 Gastrostomy status: Secondary | ICD-10-CM

## 2016-07-24 DIAGNOSIS — G893 Neoplasm related pain (acute) (chronic): Secondary | ICD-10-CM

## 2016-07-24 DIAGNOSIS — N179 Acute kidney failure, unspecified: Secondary | ICD-10-CM

## 2016-07-24 DIAGNOSIS — R110 Nausea: Secondary | ICD-10-CM

## 2016-07-24 LAB — MAGNESIUM: Magnesium: 1.9 mg/dl (ref 1.5–2.5)

## 2016-07-24 LAB — CBC WITH DIFFERENTIAL/PLATELET
BASO%: 0 % (ref 0.0–2.0)
BASOS ABS: 0 10*3/uL (ref 0.0–0.1)
EOS%: 0.9 % (ref 0.0–7.0)
Eosinophils Absolute: 0 10*3/uL (ref 0.0–0.5)
HCT: 27.4 % — ABNORMAL LOW (ref 38.4–49.9)
HEMOGLOBIN: 9.9 g/dL — AB (ref 13.0–17.1)
LYMPH#: 0.5 10*3/uL — AB (ref 0.9–3.3)
LYMPH%: 41.2 % (ref 14.0–49.0)
MCH: 31.1 pg (ref 27.2–33.4)
MCHC: 36.1 g/dL — ABNORMAL HIGH (ref 32.0–36.0)
MCV: 86.2 fL (ref 79.3–98.0)
MONO#: 0.2 10*3/uL (ref 0.1–0.9)
MONO%: 17.5 % — ABNORMAL HIGH (ref 0.0–14.0)
NEUT#: 0.5 10*3/uL — CL (ref 1.5–6.5)
NEUT%: 40.4 % (ref 39.0–75.0)
NRBC: 0 % (ref 0–0)
PLATELETS: 77 10*3/uL — AB (ref 140–400)
RBC: 3.18 10*6/uL — ABNORMAL LOW (ref 4.20–5.82)
RDW: 13.1 % (ref 11.0–14.6)
WBC: 1.1 10*3/uL — ABNORMAL LOW (ref 4.0–10.3)

## 2016-07-24 LAB — COMPREHENSIVE METABOLIC PANEL
ALBUMIN: 3.5 g/dL (ref 3.5–5.0)
ALT: 14 U/L (ref 0–55)
AST: 20 U/L (ref 5–34)
Alkaline Phosphatase: 94 U/L (ref 40–150)
Anion Gap: 9 mEq/L (ref 3–11)
BILIRUBIN TOTAL: 0.29 mg/dL (ref 0.20–1.20)
BUN: 26 mg/dL (ref 7.0–26.0)
CO2: 30 meq/L — AB (ref 22–29)
Calcium: 10 mg/dL (ref 8.4–10.4)
Chloride: 99 mEq/L (ref 98–109)
Creatinine: 1.8 mg/dL — ABNORMAL HIGH (ref 0.7–1.3)
EGFR: 46 mL/min/{1.73_m2} — AB (ref >90)
GLUCOSE: 99 mg/dL (ref 70–140)
Potassium: 3.5 mEq/L (ref 3.5–5.1)
SODIUM: 139 meq/L (ref 136–145)
TOTAL PROTEIN: 7.7 g/dL (ref 6.4–8.3)

## 2016-07-24 MED ORDER — SODIUM CHLORIDE 0.9 % IJ SOLN
10.0000 mL | INTRAMUSCULAR | Status: DC | PRN
Start: 2016-07-24 — End: 2016-07-24
  Administered 2016-07-24: 10 mL
  Filled 2016-07-24: qty 10

## 2016-07-24 MED ORDER — SODIUM CHLORIDE 0.9 % IV SOLN
Freq: Once | INTRAVENOUS | Status: AC
  Administered 2016-07-24: 12:00:00 via INTRAVENOUS

## 2016-07-24 MED ORDER — ACETAMINOPHEN 325 MG PO TABS
ORAL_TABLET | ORAL | Status: AC
  Filled 2016-07-24: qty 2

## 2016-07-24 MED ORDER — SODIUM CHLORIDE 0.9 % IV SOLN: Freq: Once | INTRAVENOUS | Status: DC

## 2016-07-24 MED ORDER — ONDANSETRON HCL 4 MG/2ML IJ SOLN
INTRAMUSCULAR | Status: AC
  Filled 2016-07-24: qty 4

## 2016-07-24 MED ORDER — HEPARIN SOD (PORK) LOCK FLUSH 100 UNIT/ML IV SOLN
500.0000 [IU] | Freq: Once | INTRAVENOUS | Status: AC | PRN
  Administered 2016-07-24: 500 [IU]
  Filled 2016-07-24: qty 5

## 2016-07-24 MED ORDER — ACETAMINOPHEN 325 MG PO TABS
650.0000 mg | ORAL_TABLET | Freq: Four times a day (QID) | ORAL | Status: DC | PRN
  Administered 2016-07-24: 650 mg via ORAL

## 2016-07-24 MED ORDER — ONDANSETRON HCL 4 MG/2ML IJ SOLN
8.0000 mg | Freq: Once | INTRAMUSCULAR | Status: AC
  Administered 2016-07-24: 8 mg via INTRAVENOUS

## 2016-07-24 NOTE — Assessment & Plan Note (Signed)
He has recent hemoptysis but that has resolved. I suspect that hemoptysis is related to possible Mallory-Weiss tear in the setting of severe thrombocytopenia. He does not require transfusion and recommend close follow-up

## 2016-07-24 NOTE — Assessment & Plan Note (Addendum)
He had completed recent chemotherapy. He is not symptomatic from pancytopenia. Recommend close observation I plan to recheck his blood count again in 2 days

## 2016-07-24 NOTE — Assessment & Plan Note (Signed)
He has stopped all his pain medicine. I encouraged him to take pain medicine as needed

## 2016-07-24 NOTE — Assessment & Plan Note (Signed)
This is resolved. I encouraged him to try to eat by mouth

## 2016-07-24 NOTE — Progress Notes (Signed)
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 Chronic small-vessel ischemic changes of the cerebral hemispheric white matter.       04/04/2016 Imaging    CT neck showed left tonsillar mass with diameter of 19 x 24 mm consistent with 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 necrosis. Suspicious level 5/supraclavicular nodes on the left. Scar type density in the right upper lobe is more prominent than was seen on a CT scan of the chest 10/06/2014      04/21/2016 PET scan    Asymmetric hypermetabolic soft tissue prominence in left palatine tonsil, consistent with known primary left tonsillar squamous cell Carcinoma. Left level 2 cervical lymphadenopathy, consistent with metastatic disease. No evidence of metastatic disease within the chest, abdomen, or pelvis. Stable 14 mm ground-glass and part solid nodular opacity in right lung apex compared to previous CT in 2016. This shows low-grade metabolic activity. Low-grade bronchogenic adenocarcinoma cannot be excluded. Consider surgical resection versus continued followup by CT in 12 months      05/17/2016 Procedure    Successful placement of a 20 French pull  through gastrostomy tube & right IJ approach [facility] [place].      05/22/2016 - 07/03/2016 Chemotherapy    He received 3 doses of high dose cisplatin with radiation       05/23/2016 -  Radiation Therapy    He received concurrent radiation therapy       INTERVAL HISTORY: Please see below for problem oriented charting. He is seen today for further follow-up. Over the weekend, he developed significant nausea and vomiting as well as hemoptysis. There has resolved. He denies fever or chills. No recent cough. He has mild constipation, resolved.  REVIEW OF SYSTEMS:   Constitutional: Denies fevers, chills or abnormal weight loss Eyes: Denies blurriness of vision Ears, nose, mouth, throat, and face: Denies mucositis or sore throat Respiratory: Denies cough, dyspnea or wheezes Cardiovascular: Denies palpitation, chest discomfort or lower extremity swelling Skin: Denies abnormal skin rashes Lymphatics: Denies new lymphadenopathy or easy bruising Neurological:Denies numbness, tingling or new weaknesses Behavioral/Psych: Mood is stable, no new changes  All other systems were reviewed with the patient and are negative.  I have reviewed the past medical history, past surgical history, social history and family history with the patient and they are unchanged from previous note.  ALLERGIES:  has No Known Allergies.  MEDICATIONS:  Current Outpatient Prescriptions  Medication Sig Dispense Refill  . Ethyl Alcohol, Skin Cleanser, (PURELL INSTANT HAND SANITIZER) 65 % FOAM Use as needed to sanitize hands 160 mL 5  . fentaNYL (DURAGESIC - DOSED MCG/HR) 25 MCG/HR patch Place 1 patch (25 mcg total) onto the skin every 3 (three) days. (Patient not taking: Reported on 07/10/2016) 5  patch 0  . lidocaine (XYLOCAINE) 2 % solution Patient: Mix 1part 2% viscous lidocaine, 1part H20. Swallow 4mL of this mixture, 48min before meals and at bedtime, up to QID PRN soreness (Patient not taking: Reported on 07/10/2016)  100 mL 5  . lidocaine-prilocaine (EMLA) cream Apply to affected area once 30 g 3  . LORazepam (ATIVAN) 0.5 MG tablet Take 1 tablet up to BID PRN nausea or anxiety.  Can be taken 30 min before radiotherapy for anxiety. 50 tablet 0  . Morphine Sulfate (MORPHINE CONCENTRATE) 10 mg / 0.5 ml concentrated solution Take 0.5 mLs (10 mg total) by mouth every 2 (two) hours as needed for severe pain. 240 mL 0  . Nutritional Supplements (FEEDING SUPPLEMENT, OSMOLITE 1.5 CAL,) LIQD Begin Osmolite 1.5 via tube at 60 ml/hr from 4pm -6am as tolerated. Increase 10 cc every 4 hours as tolerated to goal of 120 cc/hr. Flush feeding tube with 120 cc free water before and after continuous feeding. Give 240 cc free water 4 times daily at  8 am,10 am, 12 noon and 2 pm. 1659 mL 0  . ondansetron (ZOFRAN) 8 MG tablet Take 1 tablet (8 mg total) by mouth every 8 (eight) hours as needed. Start on the third day after chemotherapy. 60 tablet 1  . polyethylene glycol (MIRALAX) packet Take 17 g by mouth daily. 14 each 9  . prochlorperazine (COMPAZINE) 10 MG tablet Take 1 tablet (10 mg total) by mouth every 6 (six) hours as needed (Nausea or vomiting). 90 tablet 3  . sodium fluoride (FLUORISHIELD) 1.1 % GEL dental gel Instill one drop of gel per tooth space of fluoride tray. Place over teeth for 5 minutes. Remove. Spit out excess. Repeat nightly. 120 mL prn  . sucralfate (CARAFATE) 1 g tablet Dissolve 1 tablet in 10 mL H20 and swallow up to QID PRN sore throat (Patient not taking: Reported on 07/10/2016) 40 tablet 5   Current Facility-Administered Medications  Medication Dose Route Frequency Provider Last Rate Last Dose  . dexamethasone (DECADRON) injection 10 mg  10 mg Intravenous Once Heath Lark, MD      . LORazepam (ATIVAN) injection 0.5 mg  0.5 mg Intravenous Once Heath Lark, MD       Facility-Administered Medications Ordered in Other Visits  Medication Dose Route Frequency Provider Last Rate Last Dose  . acetaminophen (TYLENOL)  tablet 650 mg  650 mg Oral Q6H PRN Heath Lark, MD   650 mg at 07/24/16 1235  . sodium chloride 0.9 % injection 10 mL  10 mL Intracatheter PRN Heath Lark, MD   10 mL at 07/24/16 1429  . sodium chloride flush (NS) 0.9 % injection 10 mL  10 mL Intracatheter PRN Heath Lark, MD   10 mL at 05/24/16 1615    PHYSICAL EXAMINATION: ECOG PERFORMANCE STATUS: 1 - Symptomatic but completely ambulatory  Vitals:   07/24/16 1117  BP: 108/74  Pulse: 62  Resp: 16  Temp: 98.4 F (36.9 C)   Filed Weights   07/24/16 1117  Weight: 148 lb (67.1 kg)    GENERAL:alert, no distress and comfortable. He looks thin SKIN: The skin peeling around his neck has resolved EYES: normal, Conjunctiva are pink and non-injected, sclera clear OROPHARYNX:no exudate, no erythema and lips, buccal mucosa, and tongue normal  NECK: supple, thyroid normal size, non-tender, without nodularity LYMPH:  no palpable lymphadenopathy in the cervical, axillary or inguinal LUNGS: clear to auscultation and percussion with normal breathing effort HEART: regular rate & rhythm and no  murmurs and no lower extremity edema ABDOMEN:abdomen soft, non-tender and normal bowel sounds. Feeding tube site looks okay Musculoskeletal:no cyanosis of digits and no clubbing  NEURO: alert & oriented x 3 with fluent speech, no focal motor/sensory deficits  LABORATORY DATA:  I have reviewed the data as listed    Component Value Date/Time   NA 139 07/24/2016 1036   K 3.5 07/24/2016 1036   CL 105 05/17/2016 1250   CO2 30 (H) 07/24/2016 1036   GLUCOSE 99 07/24/2016 1036   BUN 26.0 07/24/2016 1036   CREATININE 1.8 (H) 07/24/2016 1036   CALCIUM 10.0 07/24/2016 1036   PROT 7.7 07/24/2016 1036   ALBUMIN 3.5 07/24/2016 1036   AST 20 07/24/2016 1036   ALT 14 07/24/2016 1036   ALKPHOS 94 07/24/2016 1036   BILITOT 0.29 07/24/2016 1036   GFRNONAA >60 05/17/2016 1250   GFRAA >60 05/17/2016 1250    No results found for: SPEP, UPEP  Lab Results   Component Value Date   WBC 1.1 (L) 07/24/2016   NEUTROABS 0.5 (LL) 07/24/2016   HGB 9.9 (L) 07/24/2016   HCT 27.4 (L) 07/24/2016   MCV 86.2 07/24/2016   PLT 77 (L) 07/24/2016      Chemistry      Component Value Date/Time   NA 139 07/24/2016 1036   K 3.5 07/24/2016 1036   CL 105 05/17/2016 1250   CO2 30 (H) 07/24/2016 1036   BUN 26.0 07/24/2016 1036   CREATININE 1.8 (H) 07/24/2016 1036      Component Value Date/Time   CALCIUM 10.0 07/24/2016 1036   ALKPHOS 94 07/24/2016 1036   AST 20 07/24/2016 1036   ALT 14 07/24/2016 1036   BILITOT 0.29 07/24/2016 1036       ASSESSMENT & PLAN:  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H[facility]) He tolerated treatment well with worsening expected side effects. He will continue to be seen on a weekly basis for supportive care  Pancytopenia, acquired [facility]) He had completed recent chemotherapy. He is not symptomatic from pancytopenia. Recommend close observation I plan to recheck his blood count again in 2 days  S/P gastrostomy ([place]) The feeding tube site looks clean without signs of infection.  Cancer associated pain He has stopped all his pain medicine. I encouraged him to take pain medicine as needed  Chemotherapy-induced nausea This is resolved. I encouraged him to try to eat by mouth  Acute prerenal failure ([place]) This is related to recent poor oral intake and dehydration. I will add additional IV fluids daily and recheck his blood in 2 days  Hemoptysis He has recent hemoptysis but that has resolved. I suspect that hemoptysis is related to possible Mallory-Weiss tear in the setting of severe thrombocytopenia. He does not require transfusion and recommend close follow-up   No orders of the defined types were placed in this encounter.  All questions were answered. The patient knows to call the clinic with any problems, questions or concerns. No barriers to learning was detected. I spent 25 minutes counseling the patient face  to face. The total time spent in the appointment was 40 minutes and more than 50% was on counseling and review of test results     Heath Lark, MD 07/24/2016 3:58 PM

## 2016-07-24 NOTE — Progress Notes (Signed)
C/o headache.  No other S/S. Given 2 tylenol with relief

## 2016-07-24 NOTE — Assessment & Plan Note (Signed)
He tolerated treatment well with worsening expected side effects. He will continue to be seen on a weekly basis for supportive care

## 2016-07-24 NOTE — Telephone Encounter (Signed)
Per LOS I have scheduled appts and notified the scheduler 

## 2016-07-24 NOTE — Telephone Encounter (Signed)
Message sent to infusion scheduler to be added per 07/24/16 los. Appointments scheduled per 07/24/16 los. A copy of the AVS report & appointment schedule was given to patient,per 07/24/16 los.

## 2016-07-24 NOTE — Assessment & Plan Note (Signed)
The feeding tube site looks clean without signs of infection.

## 2016-07-24 NOTE — Patient Instructions (Signed)
Dehydration, Adult Dehydration is a condition in which there is not enough fluid or water in the body. This happens when you lose more fluids than you take in. Important organs, such as the kidneys, brain, and heart, cannot function without a proper amount of fluids. Any loss of fluids from the body can lead to dehydration. Dehydration can range from mild to severe. This condition should be treated right away to prevent it from becoming severe. What are the causes? This condition may be caused by:  Vomiting.  Diarrhea.  Excessive sweating, such as from heat exposure or exercise.  Not drinking enough fluid, especially:  When ill.  While doing activity that requires a lot of energy.  Excessive urination.  Fever.  Infection.  Certain medicines, such as medicines that cause the body to lose excess fluid (diuretics).  Inability to access safe drinking water.  Reduced physical ability to get adequate water and food. What increases the risk? This condition is more likely to develop in people:  Who have a poorly controlled long-term (chronic) illness, such as diabetes, heart disease, or kidney disease.  Who are age 65 or older.  Who are disabled.  Who live in a place with high altitude.  Who play endurance sports. What are the signs or symptoms? Symptoms of mild dehydration may include:   Thirst.  Dry lips.  Slightly dry mouth.  Dry, warm skin.  Dizziness. Symptoms of moderate dehydration may include:   Very dry mouth.  Muscle cramps.  Dark urine. Urine may be the color of tea.  Decreased urine production.  Decreased tear production.  Heartbeat that is irregular or faster than normal (palpitations).  Headache.  Light-headedness, especially when you stand up from a sitting position.  Fainting (syncope). Symptoms of severe dehydration may include:   Changes in skin, such as:  Cold and clammy skin.  Blotchy (mottled) or pale skin.  Skin that does  not quickly return to normal after being lightly pinched and released (poor skin turgor).  Changes in body fluids, such as:  Extreme thirst.  No tear production.  Inability to sweat when body temperature is high, such as in hot weather.  Very little urine production.  Changes in vital signs, such as:  Weak pulse.  Pulse that is more than 100 beats a minute when sitting still.  Rapid breathing.  Low blood pressure.  Other changes, such as:  Sunken eyes.  Cold hands and feet.  Confusion.  Lack of energy (lethargy).  Difficulty waking up from sleep.  Short-term weight loss.  Unconsciousness. How is this diagnosed? This condition is diagnosed based on your symptoms and a physical exam. Blood and urine tests may be done to help confirm the diagnosis. How is this treated? Treatment for this condition depends on the severity. Mild or moderate dehydration can often be treated at home. Treatment should be started right away. Do not wait until dehydration becomes severe. Severe dehydration is an emergency and it needs to be treated in a hospital. Treatment for mild dehydration may include:   Drinking more fluids.  Replacing salts and minerals in your blood (electrolytes) that you may have lost. Treatment for moderate dehydration may include:   Drinking an oral rehydration solution (ORS). This is a drink that helps you replace fluids and electrolytes (rehydrate). It can be found at pharmacies and retail stores. Treatment for severe dehydration may include:   Receiving fluids through an IV tube.  Receiving an electrolyte solution through a feeding tube that is   passed through your nose and into your stomach (nasogastric tube, or NG tube).  Correcting any abnormalities in electrolytes.  Treating the underlying cause of dehydration. Follow these instructions at home:  If directed by your health care provider, drink an ORS:  Make an ORS by following instructions on the  package.  Start by drinking small amounts, about  cup (120 mL) every 5-10 minutes.  Slowly increase how much you drink until you have taken the amount recommended by your health care provider.  Drink enough clear fluid to keep your urine clear or pale yellow. If you were told to drink an ORS, finish the ORS first, then start slowly drinking other clear fluids. Drink fluids such as:  Water. Do not drink only water. Doing that can lead to having too little salt (sodium) in the body (hyponatremia).  Ice chips.  Fruit juice that you have added water to (diluted fruit juice).  Low-calorie sports drinks.  Avoid:  Alcohol.  Drinks that contain a lot of sugar. These include high-calorie sports drinks, fruit juice that is not diluted, and soda.  Caffeine.  Foods that are greasy or contain a lot of fat or sugar.  Take over-the-counter and prescription medicines only as told by your health care provider.  Do not take sodium tablets. This can lead to having too much sodium in the body (hypernatremia).  Eat foods that contain a healthy balance of electrolytes, such as bananas, oranges, potatoes, tomatoes, and spinach.  Keep all follow-up visits as told by your health care provider. This is important. Contact a health care provider if:  You have abdominal pain that:  Gets worse.  Stays in one area (localizes).  You have a rash.  You have a stiff neck.  You are more irritable than usual.  You are sleepier or more difficult to wake up than usual.  You feel weak or dizzy.  You feel very thirsty.  You have urinated only a small amount of very dark urine over 6-8 hours. Get help right away if:  You have symptoms of severe dehydration.  You cannot drink fluids without vomiting.  Your symptoms get worse with treatment.  You have a fever.  You have a severe headache.  You have vomiting or diarrhea that:  Gets worse.  Does not go away.  You have blood or green matter  (bile) in your vomit.  You have blood in your stool. This may cause stool to look black and tarry.  You have not urinated in 6-8 hours.  You faint.  Your heart rate while sitting still is over 100 beats a minute.  You have trouble breathing. This information is not intended to replace advice given to you by your health care provider. Make sure you discuss any questions you have with your health care provider. Document Released: 08/21/2005 Document Revised: 03/17/2016 Document Reviewed: 10/15/2015 [employer]vier Interactive Patient Education  2017 [employer]  

## 2016-07-24 NOTE — Assessment & Plan Note (Signed)
This is related to recent poor oral intake and dehydration. I will add additional IV fluids daily and recheck his blood in 2 days

## 2016-07-25 ENCOUNTER — Ambulatory Visit: Payer: Self-pay | Admitting: Nurse Practitioner

## 2016-07-25 NOTE — Progress Notes (Signed)
  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    Plan: The patient has completed radiation treatment. The patient will return to radiation oncology clinic for routine followup in one month. I advised the patient to call or return sooner if any questions or concerns arise that are related to recovery or treatment. Continue follow-up with med/onc closely.  -----------------------------------  Eppie Gibson, MD

## 2016-07-26 ENCOUNTER — Other Ambulatory Visit (HOSPITAL_BASED_OUTPATIENT_CLINIC_OR_DEPARTMENT_OTHER): Payer: No Typology Code available for payment source

## 2016-07-26 ENCOUNTER — Telehealth: Payer: Self-pay | Admitting: *Deleted

## 2016-07-26 ENCOUNTER — Ambulatory Visit (HOSPITAL_BASED_OUTPATIENT_CLINIC_OR_DEPARTMENT_OTHER): Payer: No Typology Code available for payment source

## 2016-07-26 VITALS — BP 123/80 | HR 61 | Temp 98.4°F | Resp 16

## 2016-07-26 DIAGNOSIS — N179 Acute kidney failure, unspecified: Secondary | ICD-10-CM

## 2016-07-26 DIAGNOSIS — C090 Malignant neoplasm of tonsillar fossa: Secondary | ICD-10-CM

## 2016-07-26 LAB — CBC WITH DIFFERENTIAL/PLATELET
BASO%: 0.1 % (ref 0.0–2.0)
Basophils Absolute: 0 10*3/uL (ref 0.0–0.1)
EOS%: 0.9 % (ref 0.0–7.0)
Eosinophils Absolute: 0 10*3/uL (ref 0.0–0.5)
HEMATOCRIT: 27.6 % — AB (ref 38.4–49.9)
HEMOGLOBIN: 9.5 g/dL — AB (ref 13.0–17.1)
LYMPH#: 0.4 10*3/uL — AB (ref 0.9–3.3)
LYMPH%: 20.7 % (ref 14.0–49.0)
MCH: 30.9 pg (ref 27.2–33.4)
MCHC: 34.4 g/dL (ref 32.0–36.0)
MCV: 89.7 fL (ref 79.3–98.0)
MONO#: 0.4 10*3/uL (ref 0.1–0.9)
MONO%: 23.7 % — AB (ref 0.0–14.0)
NEUT%: 54.6 % (ref 39.0–75.0)
NEUTROS ABS: 0.9 10*3/uL — AB (ref 1.5–6.5)
Platelets: 141 10*3/uL (ref 140–400)
RBC: 3.08 10*6/uL — ABNORMAL LOW (ref 4.20–5.82)
RDW: 14.1 % (ref 11.0–14.6)
WBC: 1.7 10*3/uL — AB (ref 4.0–10.3)

## 2016-07-26 LAB — MAGNESIUM: MAGNESIUM: 1.9 mg/dL (ref 1.5–2.5)

## 2016-07-26 LAB — COMPREHENSIVE METABOLIC PANEL
ALBUMIN: 3.6 g/dL (ref 3.5–5.0)
ALK PHOS: 95 U/L (ref 40–150)
ALT: 14 U/L (ref 0–55)
ANION GAP: 11 meq/L (ref 3–11)
AST: 23 U/L (ref 5–34)
BILIRUBIN TOTAL: 0.3 mg/dL (ref 0.20–1.20)
BUN: 19.8 mg/dL (ref 7.0–26.0)
CALCIUM: 9.8 mg/dL (ref 8.4–10.4)
CO2: 30 mEq/L — ABNORMAL HIGH (ref 22–29)
Chloride: 95 mEq/L — ABNORMAL LOW (ref 98–109)
Creatinine: 1.9 mg/dL — ABNORMAL HIGH (ref 0.7–1.3)
EGFR: 45 mL/min/{1.73_m2} — AB (ref >90)
GLUCOSE: 87 mg/dL (ref 70–140)
POTASSIUM: 3.4 meq/L — AB (ref 3.5–5.1)
SODIUM: 136 meq/L (ref 136–145)
Total Protein: 7.5 g/dL (ref 6.4–8.3)

## 2016-07-26 MED ORDER — HEPARIN SOD (PORK) LOCK FLUSH 100 UNIT/ML IV SOLN
500.0000 [IU] | Freq: Once | INTRAVENOUS | Status: AC | PRN
  Administered 2016-07-26: 500 [IU]
  Filled 2016-07-26: qty 5

## 2016-07-26 MED ORDER — SODIUM CHLORIDE 0.9 % IV SOLN
Freq: Once | INTRAVENOUS | Status: AC
  Administered 2016-07-26: 13:00:00 via INTRAVENOUS
  Filled 2016-07-26: qty 4

## 2016-07-26 MED ORDER — SODIUM CHLORIDE 0.9 % IV SOLN
Freq: Once | INTRAVENOUS | Status: AC
  Administered 2016-07-26: 13:00:00 via INTRAVENOUS

## 2016-07-26 MED ORDER — ONDANSETRON HCL 4 MG/2ML IJ SOLN
INTRAMUSCULAR | Status: AC
  Filled 2016-07-26: qty 4

## 2016-07-26 MED ORDER — SODIUM CHLORIDE 0.9 % IJ SOLN
10.0000 mL | INTRAMUSCULAR | Status: DC | PRN
  Administered 2016-07-26: 10 mL
  Filled 2016-07-26: qty 10

## 2016-07-26 MED ORDER — ACETAMINOPHEN 325 MG PO TABS
650.0000 mg | ORAL_TABLET | Freq: Four times a day (QID) | ORAL | Status: DC | PRN
  Administered 2016-07-26: 650 mg via ORAL

## 2016-07-26 MED ORDER — ACETAMINOPHEN 325 MG PO TABS
ORAL_TABLET | ORAL | Status: AC
Start: 2016-07-26 — End: 2016-07-26
  Filled 2016-07-26: qty 2

## 2016-07-26 NOTE — Telephone Encounter (Signed)
Informed pt of lab results and Creatinine not improved, risk for kidney failure due to dehydration.  Emphasized importance of keeping IVF appts as scheduled.  Gave him calendar and reviewed his appts. Informed him of lab added on Monday and Infusion RN can draw labs.   Encouraged pt to take more water via PEG tube or PO, especially tomorrow on Thanksgiving since we are closed and he not getting IVFs tomorrow.  Pt verbalized understanding.

## 2016-07-26 NOTE — Telephone Encounter (Signed)
He had renal failure and pancytopenia 2 days ago If he cannot make it just document in the chart. He was made aware of the significant lab findings

## 2016-07-26 NOTE — Telephone Encounter (Signed)
-----   Message from Heath Lark, MD sent at 07/26/2016  8:52 AM EST ----- Regarding: no show Can you find out what happened to him?

## 2016-07-26 NOTE — Telephone Encounter (Signed)
Pt did not show for his lab/IVFs this morning.  Called pt's home and s/w mother who says "pt stepped out for a minute."   She was unaware of any appts today.   Called pt's cell phone and left a message asking him to call nurse back regarding his appts today.

## 2016-07-26 NOTE — Patient Instructions (Signed)
Dehydration, Adult Dehydration is a condition in which there is not enough fluid or water in the body. This happens when you lose more fluids than you take in. Important organs, such as the kidneys, brain, and heart, cannot function without a proper amount of fluids. Any loss of fluids from the body can lead to dehydration. Dehydration can range from mild to severe. This condition should be treated right away to prevent it from becoming severe. What are the causes? This condition may be caused by:  Vomiting.  Diarrhea.  Excessive sweating, such as from heat exposure or exercise.  Not drinking enough fluid, especially:  When ill.  While doing activity that requires a lot of energy.  Excessive urination.  Fever.  Infection.  Certain medicines, such as medicines that cause the body to lose excess fluid (diuretics).  Inability to access safe drinking water.  Reduced physical ability to get adequate water and food. What increases the risk? This condition is more likely to develop in people:  Who have a poorly controlled long-term (chronic) illness, such as diabetes, heart disease, or kidney disease.  Who are age 65 or older.  Who are disabled.  Who live in a place with high altitude.  Who play endurance sports. What are the signs or symptoms? Symptoms of mild dehydration may include:   Thirst.  Dry lips.  Slightly dry mouth.  Dry, warm skin.  Dizziness. Symptoms of moderate dehydration may include:   Very dry mouth.  Muscle cramps.  Dark urine. Urine may be the color of tea.  Decreased urine production.  Decreased tear production.  Heartbeat that is irregular or faster than normal (palpitations).  Headache.  Light-headedness, especially when you stand up from a sitting position.  Fainting (syncope). Symptoms of severe dehydration may include:   Changes in skin, such as:  Cold and clammy skin.  Blotchy (mottled) or pale skin.  Skin that does  not quickly return to normal after being lightly pinched and released (poor skin turgor).  Changes in body fluids, such as:  Extreme thirst.  No tear production.  Inability to sweat when body temperature is high, such as in hot weather.  Very little urine production.  Changes in vital signs, such as:  Weak pulse.  Pulse that is more than 100 beats a minute when sitting still.  Rapid breathing.  Low blood pressure.  Other changes, such as:  Sunken eyes.  Cold hands and feet.  Confusion.  Lack of energy (lethargy).  Difficulty waking up from sleep.  Short-term weight loss.  Unconsciousness. How is this diagnosed? This condition is diagnosed based on your symptoms and a physical exam. Blood and urine tests may be done to help confirm the diagnosis. How is this treated? Treatment for this condition depends on the severity. Mild or moderate dehydration can often be treated at home. Treatment should be started right away. Do not wait until dehydration becomes severe. Severe dehydration is an emergency and it needs to be treated in a hospital. Treatment for mild dehydration may include:   Drinking more fluids.  Replacing salts and minerals in your blood (electrolytes) that you may have lost. Treatment for moderate dehydration may include:   Drinking an oral rehydration solution (ORS). This is a drink that helps you replace fluids and electrolytes (rehydrate). It can be found at pharmacies and retail stores. Treatment for severe dehydration may include:   Receiving fluids through an IV tube.  Receiving an electrolyte solution through a feeding tube that is   passed through your nose and into your stomach (nasogastric tube, or NG tube).  Correcting any abnormalities in electrolytes.  Treating the underlying cause of dehydration. Follow these instructions at home:  If directed by your health care provider, drink an ORS:  Make an ORS by following instructions on the  package.  Start by drinking small amounts, about  cup (120 mL) every 5-10 minutes.  Slowly increase how much you drink until you have taken the amount recommended by your health care provider.  Drink enough clear fluid to keep your urine clear or pale yellow. If you were told to drink an ORS, finish the ORS first, then start slowly drinking other clear fluids. Drink fluids such as:  Water. Do not drink only water. Doing that can lead to having too little salt (sodium) in the body (hyponatremia).  Ice chips.  Fruit juice that you have added water to (diluted fruit juice).  Low-calorie sports drinks.  Avoid:  Alcohol.  Drinks that contain a lot of sugar. These include high-calorie sports drinks, fruit juice that is not diluted, and soda.  Caffeine.  Foods that are greasy or contain a lot of fat or sugar.  Take over-the-counter and prescription medicines only as told by your health care provider.  Do not take sodium tablets. This can lead to having too much sodium in the body (hypernatremia).  Eat foods that contain a healthy balance of electrolytes, such as bananas, oranges, potatoes, tomatoes, and spinach.  Keep all follow-up visits as told by your health care provider. This is important. Contact a health care provider if:  You have abdominal pain that:  Gets worse.  Stays in one area (localizes).  You have a rash.  You have a stiff neck.  You are more irritable than usual.  You are sleepier or more difficult to wake up than usual.  You feel weak or dizzy.  You feel very thirsty.  You have urinated only a small amount of very dark urine over 6-8 hours. Get help right away if:  You have symptoms of severe dehydration.  You cannot drink fluids without vomiting.  Your symptoms get worse with treatment.  You have a fever.  You have a severe headache.  You have vomiting or diarrhea that:  Gets worse.  Does not go away.  You have blood or green matter  (bile) in your vomit.  You have blood in your stool. This may cause stool to look black and tarry.  You have not urinated in 6-8 hours.  You faint.  Your heart rate while sitting still is over 100 beats a minute.  You have trouble breathing. This information is not intended to replace advice given to you by your health care provider. Make sure you discuss any questions you have with your health care provider. Document Released: 08/21/2005 Document Revised: 03/17/2016 Document Reviewed: 10/15/2015 [employer]vier Interactive Patient Education  2017 [employer]  

## 2016-07-26 NOTE — Telephone Encounter (Signed)
-----   Message from Heath Lark, MD sent at 07/26/2016  1:28 PM EST ----- Regarding: labs CBC is better but Creatinine not any better Can you let him know of test results and add another lab again Monday next week and make sure he is aware of results? ----- Message ----- From: Interface, Lab In Three Zero One Sent: 07/26/2016  12:49 PM To: Heath Lark, MD

## 2016-07-28 ENCOUNTER — Ambulatory Visit (HOSPITAL_BASED_OUTPATIENT_CLINIC_OR_DEPARTMENT_OTHER): Payer: No Typology Code available for payment source

## 2016-07-28 VITALS — BP 102/80 | HR 77 | Temp 98.5°F | Resp 17

## 2016-07-28 DIAGNOSIS — C090 Malignant neoplasm of tonsillar fossa: Secondary | ICD-10-CM

## 2016-07-28 MED ORDER — ACETAMINOPHEN 325 MG PO TABS
ORAL_TABLET | ORAL | Status: AC
  Filled 2016-07-28: qty 2

## 2016-07-28 MED ORDER — SODIUM CHLORIDE 0.9 % IV SOLN: Freq: Once | INTRAVENOUS | Status: DC

## 2016-07-28 MED ORDER — ACETAMINOPHEN 325 MG PO TABS
650.0000 mg | ORAL_TABLET | Freq: Four times a day (QID) | ORAL | Status: DC | PRN
  Administered 2016-07-28: 650 mg via ORAL

## 2016-07-28 MED ORDER — SODIUM CHLORIDE 0.9 % IJ SOLN
10.0000 mL | INTRAMUSCULAR | Status: DC | PRN
  Administered 2016-07-28: 10 mL
  Filled 2016-07-28: qty 10

## 2016-07-28 MED ORDER — SODIUM CHLORIDE 0.9 % IV SOLN
Freq: Once | INTRAVENOUS | Status: AC
  Administered 2016-07-28: 09:00:00 via INTRAVENOUS

## 2016-07-28 MED ORDER — ONDANSETRON HCL 4 MG/2ML IJ SOLN
8.0000 mg | Freq: Once | INTRAMUSCULAR | Status: AC
  Administered 2016-07-28: 8 mg via INTRAVENOUS

## 2016-07-28 MED ORDER — ONDANSETRON HCL 4 MG/2ML IJ SOLN
INTRAMUSCULAR | Status: AC
  Filled 2016-07-28: qty 4

## 2016-07-28 MED ORDER — HEPARIN SOD (PORK) LOCK FLUSH 100 UNIT/ML IV SOLN
500.0000 [IU] | Freq: Once | INTRAVENOUS | Status: AC | PRN
  Administered 2016-07-28: 500 [IU]
  Filled 2016-07-28: qty 5

## 2016-07-28 NOTE — Patient Instructions (Signed)
Dehydration, Adult Dehydration is a condition in which there is not enough fluid or water in the body. This happens when you lose more fluids than you take in. Important organs, such as the kidneys, brain, and heart, cannot function without a proper amount of fluids. Any loss of fluids from the body can lead to dehydration. Dehydration can range from mild to severe. This condition should be treated right away to prevent it from becoming severe. What are the causes? This condition may be caused by:  Vomiting.  Diarrhea.  Excessive sweating, such as from heat exposure or exercise.  Not drinking enough fluid, especially:  When ill.  While doing activity that requires a lot of energy.  Excessive urination.  Fever.  Infection.  Certain medicines, such as medicines that cause the body to lose excess fluid (diuretics).  Inability to access safe drinking water.  Reduced physical ability to get adequate water and food. What increases the risk? This condition is more likely to develop in people:  Who have a poorly controlled long-term (chronic) illness, such as diabetes, heart disease, or kidney disease.  Who are age 65 or older.  Who are disabled.  Who live in a place with high altitude.  Who play endurance sports. What are the signs or symptoms? Symptoms of mild dehydration may include:   Thirst.  Dry lips.  Slightly dry mouth.  Dry, warm skin.  Dizziness. Symptoms of moderate dehydration may include:   Very dry mouth.  Muscle cramps.  Dark urine. Urine may be the color of tea.  Decreased urine production.  Decreased tear production.  Heartbeat that is irregular or faster than normal (palpitations).  Headache.  Light-headedness, especially when you stand up from a sitting position.  Fainting (syncope). Symptoms of severe dehydration may include:   Changes in skin, such as:  Cold and clammy skin.  Blotchy (mottled) or pale skin.  Skin that does  not quickly return to normal after being lightly pinched and released (poor skin turgor).  Changes in body fluids, such as:  Extreme thirst.  No tear production.  Inability to sweat when body temperature is high, such as in hot weather.  Very little urine production.  Changes in vital signs, such as:  Weak pulse.  Pulse that is more than 100 beats a minute when sitting still.  Rapid breathing.  Low blood pressure.  Other changes, such as:  Sunken eyes.  Cold hands and feet.  Confusion.  Lack of energy (lethargy).  Difficulty waking up from sleep.  Short-term weight loss.  Unconsciousness. How is this diagnosed? This condition is diagnosed based on your symptoms and a physical exam. Blood and urine tests may be done to help confirm the diagnosis. How is this treated? Treatment for this condition depends on the severity. Mild or moderate dehydration can often be treated at home. Treatment should be started right away. Do not wait until dehydration becomes severe. Severe dehydration is an emergency and it needs to be treated in a hospital. Treatment for mild dehydration may include:   Drinking more fluids.  Replacing salts and minerals in your blood (electrolytes) that you may have lost. Treatment for moderate dehydration may include:   Drinking an oral rehydration solution (ORS). This is a drink that helps you replace fluids and electrolytes (rehydrate). It can be found at pharmacies and retail stores. Treatment for severe dehydration may include:   Receiving fluids through an IV tube.  Receiving an electrolyte solution through a feeding tube that is   passed through your nose and into your stomach (nasogastric tube, or NG tube).  Correcting any abnormalities in electrolytes.  Treating the underlying cause of dehydration. Follow these instructions at home:  If directed by your health care provider, drink an ORS:  Make an ORS by following instructions on the  package.  Start by drinking small amounts, about  cup (120 mL) every 5-10 minutes.  Slowly increase how much you drink until you have taken the amount recommended by your health care provider.  Drink enough clear fluid to keep your urine clear or pale yellow. If you were told to drink an ORS, finish the ORS first, then start slowly drinking other clear fluids. Drink fluids such as:  Water. Do not drink only water. Doing that can lead to having too little salt (sodium) in the body (hyponatremia).  Ice chips.  Fruit juice that you have added water to (diluted fruit juice).  Low-calorie sports drinks.  Avoid:  Alcohol.  Drinks that contain a lot of sugar. These include high-calorie sports drinks, fruit juice that is not diluted, and soda.  Caffeine.  Foods that are greasy or contain a lot of fat or sugar.  Take over-the-counter and prescription medicines only as told by your health care provider.  Do not take sodium tablets. This can lead to having too much sodium in the body (hypernatremia).  Eat foods that contain a healthy balance of electrolytes, such as bananas, oranges, potatoes, tomatoes, and spinach.  Keep all follow-up visits as told by your health care provider. This is important. Contact a health care provider if:  You have abdominal pain that:  Gets worse.  Stays in one area (localizes).  You have a rash.  You have a stiff neck.  You are more irritable than usual.  You are sleepier or more difficult to wake up than usual.  You feel weak or dizzy.  You feel very thirsty.  You have urinated only a small amount of very dark urine over 6-8 hours. Get help right away if:  You have symptoms of severe dehydration.  You cannot drink fluids without vomiting.  Your symptoms get worse with treatment.  You have a fever.  You have a severe headache.  You have vomiting or diarrhea that:  Gets worse.  Does not go away.  You have blood or green matter  (bile) in your vomit.  You have blood in your stool. This may cause stool to look black and tarry.  You have not urinated in 6-8 hours.  You faint.  Your heart rate while sitting still is over 100 beats a minute.  You have trouble breathing. This information is not intended to replace advice given to you by your health care provider. Make sure you discuss any questions you have with your health care provider. Document Released: 08/21/2005 Document Revised: 03/17/2016 Document Reviewed: 10/15/2015 [employer]vier Interactive Patient Education  2017 [employer]  

## 2016-07-29 ENCOUNTER — Ambulatory Visit (HOSPITAL_BASED_OUTPATIENT_CLINIC_OR_DEPARTMENT_OTHER): Payer: No Typology Code available for payment source

## 2016-07-29 VITALS — BP 105/66 | HR 74 | Temp 98.2°F | Resp 18

## 2016-07-29 DIAGNOSIS — C090 Malignant neoplasm of tonsillar fossa: Secondary | ICD-10-CM

## 2016-07-29 MED ORDER — SODIUM CHLORIDE 0.9 % IV SOLN
Freq: Once | INTRAVENOUS | Status: AC
  Administered 2016-07-29: 10:00:00 via INTRAVENOUS

## 2016-07-29 MED ORDER — HEPARIN SOD (PORK) LOCK FLUSH 100 UNIT/ML IV SOLN
500.0000 [IU] | Freq: Once | INTRAVENOUS | Status: AC | PRN
  Administered 2016-07-29: 500 [IU]
  Filled 2016-07-29: qty 5

## 2016-07-29 MED ORDER — ONDANSETRON HCL 8 MG PO TABS
ORAL_TABLET | ORAL | Status: AC
  Filled 2016-07-29: qty 1

## 2016-07-29 MED ORDER — ACETAMINOPHEN 325 MG PO TABS
ORAL_TABLET | ORAL | Status: AC
  Filled 2016-07-29: qty 2

## 2016-07-29 MED ORDER — ACETAMINOPHEN 325 MG PO TABS
650.0000 mg | ORAL_TABLET | Freq: Four times a day (QID) | ORAL | Status: DC | PRN
  Administered 2016-07-29: 650 mg via ORAL

## 2016-07-29 MED ORDER — SODIUM CHLORIDE 0.9 % IJ SOLN
10.0000 mL | INTRAMUSCULAR | Status: DC | PRN
  Administered 2016-07-29: 10 mL
  Filled 2016-07-29: qty 10

## 2016-07-29 MED ORDER — SODIUM CHLORIDE 0.9 % IV SOLN
Freq: Once | INTRAVENOUS | Status: AC
  Administered 2016-07-29: 09:00:00 via INTRAVENOUS

## 2016-07-29 NOTE — Patient Instructions (Signed)
Dehydration, Adult Dehydration is a condition in which there is not enough fluid or water in the body. This happens when you lose more fluids than you take in. Important organs, such as the kidneys, brain, and heart, cannot function without a proper amount of fluids. Any loss of fluids from the body can lead to dehydration. Dehydration can range from mild to severe. This condition should be treated right away to prevent it from becoming severe. What are the causes? This condition may be caused by:  Vomiting.  Diarrhea.  Excessive sweating, such as from heat exposure or exercise.  Not drinking enough fluid, especially:  When ill.  While doing activity that requires a lot of energy.  Excessive urination.  Fever.  Infection.  Certain medicines, such as medicines that cause the body to lose excess fluid (diuretics).  Inability to access safe drinking water.  Reduced physical ability to get adequate water and food. What increases the risk? This condition is more likely to develop in people:  Who have a poorly controlled long-term (chronic) illness, such as diabetes, heart disease, or kidney disease.  Who are age 65 or older.  Who are disabled.  Who live in a place with high altitude.  Who play endurance sports. What are the signs or symptoms? Symptoms of mild dehydration may include:   Thirst.  Dry lips.  Slightly dry mouth.  Dry, warm skin.  Dizziness. Symptoms of moderate dehydration may include:   Very dry mouth.  Muscle cramps.  Dark urine. Urine may be the color of tea.  Decreased urine production.  Decreased tear production.  Heartbeat that is irregular or faster than normal (palpitations).  Headache.  Light-headedness, especially when you stand up from a sitting position.  Fainting (syncope). Symptoms of severe dehydration may include:   Changes in skin, such as:  Cold and clammy skin.  Blotchy (mottled) or pale skin.  Skin that does  not quickly return to normal after being lightly pinched and released (poor skin turgor).  Changes in body fluids, such as:  Extreme thirst.  No tear production.  Inability to sweat when body temperature is high, such as in hot weather.  Very little urine production.  Changes in vital signs, such as:  Weak pulse.  Pulse that is more than 100 beats a minute when sitting still.  Rapid breathing.  Low blood pressure.  Other changes, such as:  Sunken eyes.  Cold hands and feet.  Confusion.  Lack of energy (lethargy).  Difficulty waking up from sleep.  Short-term weight loss.  Unconsciousness. How is this diagnosed? This condition is diagnosed based on your symptoms and a physical exam. Blood and urine tests may be done to help confirm the diagnosis. How is this treated? Treatment for this condition depends on the severity. Mild or moderate dehydration can often be treated at home. Treatment should be started right away. Do not wait until dehydration becomes severe. Severe dehydration is an emergency and it needs to be treated in a hospital. Treatment for mild dehydration may include:   Drinking more fluids.  Replacing salts and minerals in your blood (electrolytes) that you may have lost. Treatment for moderate dehydration may include:   Drinking an oral rehydration solution (ORS). This is a drink that helps you replace fluids and electrolytes (rehydrate). It can be found at pharmacies and retail stores. Treatment for severe dehydration may include:   Receiving fluids through an IV tube.  Receiving an electrolyte solution through a feeding tube that is   passed through your nose and into your stomach (nasogastric tube, or NG tube).  Correcting any abnormalities in electrolytes.  Treating the underlying cause of dehydration. Follow these instructions at home:  If directed by your health care provider, drink an ORS:  Make an ORS by following instructions on the  package.  Start by drinking small amounts, about  cup (120 mL) every 5-10 minutes.  Slowly increase how much you drink until you have taken the amount recommended by your health care provider.  Drink enough clear fluid to keep your urine clear or pale yellow. If you were told to drink an ORS, finish the ORS first, then start slowly drinking other clear fluids. Drink fluids such as:  Water. Do not drink only water. Doing that can lead to having too little salt (sodium) in the body (hyponatremia).  Ice chips.  Fruit juice that you have added water to (diluted fruit juice).  Low-calorie sports drinks.  Avoid:  Alcohol.  Drinks that contain a lot of sugar. These include high-calorie sports drinks, fruit juice that is not diluted, and soda.  Caffeine.  Foods that are greasy or contain a lot of fat or sugar.  Take over-the-counter and prescription medicines only as told by your health care provider.  Do not take sodium tablets. This can lead to having too much sodium in the body (hypernatremia).  Eat foods that contain a healthy balance of electrolytes, such as bananas, oranges, potatoes, tomatoes, and spinach.  Keep all follow-up visits as told by your health care provider. This is important. Contact a health care provider if:  You have abdominal pain that:  Gets worse.  Stays in one area (localizes).  You have a rash.  You have a stiff neck.  You are more irritable than usual.  You are sleepier or more difficult to wake up than usual.  You feel weak or dizzy.  You feel very thirsty.  You have urinated only a small amount of very dark urine over 6-8 hours. Get help right away if:  You have symptoms of severe dehydration.  You cannot drink fluids without vomiting.  Your symptoms get worse with treatment.  You have a fever.  You have a severe headache.  You have vomiting or diarrhea that:  Gets worse.  Does not go away.  You have blood or green matter  (bile) in your vomit.  You have blood in your stool. This may cause stool to look black and tarry.  You have not urinated in 6-8 hours.  You faint.  Your heart rate while sitting still is over 100 beats a minute.  You have trouble breathing. This information is not intended to replace advice given to you by your health care provider. Make sure you discuss any questions you have with your health care provider. Document Released: 08/21/2005 Document Revised: 03/17/2016 Document Reviewed: 10/15/2015 [employer]vier Interactive Patient Education  2017 [employer]  

## 2016-07-31 ENCOUNTER — Other Ambulatory Visit (HOSPITAL_BASED_OUTPATIENT_CLINIC_OR_DEPARTMENT_OTHER): Payer: No Typology Code available for payment source

## 2016-07-31 ENCOUNTER — Ambulatory Visit (HOSPITAL_BASED_OUTPATIENT_CLINIC_OR_DEPARTMENT_OTHER): Payer: No Typology Code available for payment source

## 2016-07-31 VITALS — BP 126/84 | HR 65 | Temp 98.8°F | Resp 18

## 2016-07-31 DIAGNOSIS — C090 Malignant neoplasm of tonsillar fossa: Secondary | ICD-10-CM

## 2016-07-31 LAB — CBC WITH DIFFERENTIAL/PLATELET
BASO%: 0 % (ref 0.0–2.0)
Basophils Absolute: 0 10*3/uL (ref 0.0–0.1)
EOS%: 0.3 % (ref 0.0–7.0)
Eosinophils Absolute: 0 10*3/uL (ref 0.0–0.5)
HCT: 27.6 % — ABNORMAL LOW (ref 38.4–49.9)
HEMOGLOBIN: 9.5 g/dL — AB (ref 13.0–17.1)
LYMPH%: 21.3 % (ref 14.0–49.0)
MCH: 31.5 pg (ref 27.2–33.4)
MCHC: 34.5 g/dL (ref 32.0–36.0)
MCV: 91.3 fL (ref 79.3–98.0)
MONO#: 0.6 10*3/uL (ref 0.1–0.9)
MONO%: 30.8 % — ABNORMAL HIGH (ref 0.0–14.0)
NEUT%: 47.6 % (ref 39.0–75.0)
NEUTROS ABS: 0.9 10*3/uL — AB (ref 1.5–6.5)
PLATELETS: 312 10*3/uL (ref 140–400)
RBC: 3.02 10*6/uL — ABNORMAL LOW (ref 4.20–5.82)
RDW: 14.6 % (ref 11.0–14.6)
WBC: 1.8 10*3/uL — ABNORMAL LOW (ref 4.0–10.3)
lymph#: 0.4 10*3/uL — ABNORMAL LOW (ref 0.9–3.3)

## 2016-07-31 LAB — COMPREHENSIVE METABOLIC PANEL
ALT: 11 U/L (ref 0–55)
ANION GAP: 10 meq/L (ref 3–11)
AST: 19 U/L (ref 5–34)
Albumin: 3.4 g/dL — ABNORMAL LOW (ref 3.5–5.0)
Alkaline Phosphatase: 96 U/L (ref 40–150)
BUN: 21.2 mg/dL (ref 7.0–26.0)
CALCIUM: 9.6 mg/dL (ref 8.4–10.4)
CHLORIDE: 99 meq/L (ref 98–109)
CO2: 29 mEq/L (ref 22–29)
Creatinine: 1.7 mg/dL — ABNORMAL HIGH (ref 0.7–1.3)
EGFR: 50 mL/min/{1.73_m2} — ABNORMAL LOW (ref >90)
Glucose: 87 mg/dl (ref 70–140)
POTASSIUM: 4.2 meq/L (ref 3.5–5.1)
Sodium: 138 mEq/L (ref 136–145)
Total Bilirubin: 0.29 mg/dL (ref 0.20–1.20)
Total Protein: 7.1 g/dL (ref 6.4–8.3)

## 2016-07-31 LAB — MAGNESIUM: MAGNESIUM: 2.1 mg/dL (ref 1.5–2.5)

## 2016-07-31 MED ORDER — ACETAMINOPHEN 325 MG PO TABS
ORAL_TABLET | ORAL | Status: AC
  Filled 2016-07-31: qty 2

## 2016-07-31 MED ORDER — ACETAMINOPHEN 325 MG PO TABS
650.0000 mg | ORAL_TABLET | Freq: Four times a day (QID) | ORAL | Status: DC | PRN
  Administered 2016-07-31: 650 mg via ORAL

## 2016-07-31 MED ORDER — ONDANSETRON HCL 4 MG/2ML IJ SOLN
INTRAMUSCULAR | Status: AC
  Filled 2016-07-31: qty 4

## 2016-07-31 MED ORDER — SODIUM CHLORIDE 0.9 % IJ SOLN
10.0000 mL | INTRAMUSCULAR | Status: DC | PRN
Start: 2016-07-31 — End: 2016-07-31
  Administered 2016-07-31: 10 mL
  Filled 2016-07-31: qty 10

## 2016-07-31 MED ORDER — ONDANSETRON HCL 40 MG/20ML IJ SOLN: Freq: Once | INTRAMUSCULAR | Status: DC

## 2016-07-31 MED ORDER — HEPARIN SOD (PORK) LOCK FLUSH 100 UNIT/ML IV SOLN
500.0000 [IU] | Freq: Once | INTRAVENOUS | Status: AC | PRN
  Administered 2016-07-31: 500 [IU]
  Filled 2016-07-31: qty 5

## 2016-07-31 MED ORDER — HYDROMORPHONE HCL 4 MG/ML IJ SOLN: 2.0000 mg | INTRAMUSCULAR | Status: DC | PRN

## 2016-07-31 MED ORDER — PROMETHAZINE HCL 25 MG/ML IJ SOLN
12.5000 mg | Freq: Once | INTRAMUSCULAR | Status: DC
  Filled 2016-07-31: qty 1

## 2016-07-31 MED ORDER — ONDANSETRON HCL 4 MG/2ML IJ SOLN
8.0000 mg | Freq: Once | INTRAMUSCULAR | Status: AC
  Administered 2016-07-31: 8 mg via INTRAVENOUS

## 2016-07-31 MED ORDER — SODIUM CHLORIDE 0.9 % IV SOLN
Freq: Once | INTRAVENOUS | Status: AC
  Administered 2016-07-31: 09:00:00 via INTRAVENOUS

## 2016-07-31 NOTE — Progress Notes (Signed)
Patient ambulates well at discharge.  No signs or symptoms of dizziness or nausea noted.  Patient discharged home with no complaints.

## 2016-07-31 NOTE — Patient Instructions (Signed)
Dehydration, Adult Dehydration is when there is not enough fluid or water in your body. This happens when you lose more fluids than you take in. Dehydration can range from mild to very bad. It should be treated right away to keep it from getting very bad. Symptoms of mild dehydration may include:   Thirst.  Dry lips.  Slightly dry mouth.  Dry, warm skin.  Dizziness. Symptoms of moderate dehydration may include:   Very dry mouth.  Muscle cramps.  Dark pee (urine). Pee may be the color of tea.  Your body making less pee.  Your eyes making fewer tears.  Heartbeat that is uneven or faster than normal (palpitations).  Headache.  Light-headedness, especially when you stand up from sitting.  Fainting (syncope). Symptoms of very bad dehydration may include:   Changes in skin, such as:  Cold and clammy skin.  Blotchy (mottled) or pale skin.  Skin that does not quickly return to normal after being lightly pinched and let go (poor skin turgor).  Changes in body fluids, such as:  Feeling very thirsty.  Your eyes making fewer tears.  Not sweating when body temperature is high, such as in hot weather.  Your body making very little pee.  Changes in vital signs, such as:  Weak pulse.  Pulse that is more than 100 beats a minute when you are sitting still.  Fast breathing.  Low blood pressure.  Other changes, such as:  Sunken eyes.  Cold hands and feet.  Confusion.  Lack of energy (lethargy).  Trouble waking up from sleep.  Short-term weight loss.  Unconsciousness. Follow these instructions at home:  If told by your doctor, drink an ORS:  Make an ORS by using instructions on the package.  Start by drinking small amounts, about  cup (120 mL) every 5-10 minutes.  Slowly drink more until you have had the amount that your doctor said to have.  Drink enough clear fluid to keep your pee clear or pale yellow. If you were told to drink an ORS, finish the  ORS first, then start slowly drinking clear fluids. Drink fluids such as:  Water. Do not drink only water by itself. Doing that can make the salt (sodium) level in your body get too low (hyponatremia).  Ice chips.  Fruit juice that you have added water to (diluted).  Low-calorie sports drinks.  Avoid:  Alcohol.  Drinks that have a lot of sugar. These include high-calorie sports drinks, fruit juice that does not have water added, and soda.  Caffeine.  Foods that are greasy or have a lot of fat or sugar.  Take over-the-counter and prescription medicines only as told by your doctor.  Do not take salt tablets. Doing that can make the salt level in your body get too high (hypernatremia).  Eat foods that have minerals (electrolytes). Examples include bananas, oranges, potatoes, tomatoes, and spinach.  Keep all follow-up visits as told by your doctor. This is important. Contact a doctor if:  You have belly (abdominal) pain that:  Gets worse.  Stays in one area (localizes).  You have a rash.  You have a stiff neck.  You get angry or annoyed more easily than normal (irritability).  You are more sleepy than normal.  You have a harder time waking up than normal.  You feel:  Weak.  Dizzy.  Very thirsty.  You have peed (urinated) only a small amount of very dark pee during 6-8 hours. Get help right away if:  You   have symptoms of very bad dehydration.  You cannot drink fluids without throwing up (vomiting).  Your symptoms get worse with treatment.  You have a fever.  You have a very bad headache.  You are throwing up or having watery poop (diarrhea) and it:  Gets worse.  Does not go away.  You have blood or something green (bile) in your throw-up.  You have blood in your poop (stool). This may cause poop to look black and tarry.  You have not peed in 6-8 hours.  You pass out (faint).  Your heart rate when you are sitting still is more than 100 beats a  minute.  You have trouble breathing. This information is not intended to replace advice given to you by your health care provider. Make sure you discuss any questions you have with your health care provider. Document Released: 06/17/2009 Document Revised: 03/10/2016 Document Reviewed: 10/15/2015 Elsevier Interactive Patient Education  2017 [employer]  

## 2016-08-02 ENCOUNTER — Ambulatory Visit (HOSPITAL_BASED_OUTPATIENT_CLINIC_OR_DEPARTMENT_OTHER): Payer: No Typology Code available for payment source

## 2016-08-02 VITALS — BP 119/85 | HR 64 | Temp 98.6°F | Resp 18

## 2016-08-02 DIAGNOSIS — C090 Malignant neoplasm of tonsillar fossa: Secondary | ICD-10-CM

## 2016-08-02 MED ORDER — ONDANSETRON HCL 4 MG/2ML IJ SOLN
INTRAMUSCULAR | Status: AC
  Filled 2016-08-02: qty 2

## 2016-08-02 MED ORDER — PROMETHAZINE HCL 25 MG/ML IJ SOLN
12.5000 mg | Freq: Once | INTRAMUSCULAR | Status: DC
  Filled 2016-08-02: qty 1

## 2016-08-02 MED ORDER — SODIUM CHLORIDE 0.9 % IV SOLN: Freq: Once | INTRAVENOUS | Status: DC

## 2016-08-02 MED ORDER — ACETAMINOPHEN 325 MG PO TABS
650.0000 mg | ORAL_TABLET | Freq: Four times a day (QID) | ORAL | Status: DC | PRN
Start: 2016-08-02 — End: 2016-08-02
  Administered 2016-08-02: 650 mg via ORAL

## 2016-08-02 MED ORDER — SODIUM CHLORIDE 0.9 % IV SOLN
Freq: Once | INTRAVENOUS | Status: AC
  Administered 2016-08-02: 09:00:00 via INTRAVENOUS

## 2016-08-02 MED ORDER — ONDANSETRON HCL 40 MG/20ML IJ SOLN: Freq: Once | INTRAMUSCULAR | Status: DC

## 2016-08-02 MED ORDER — ACETAMINOPHEN 325 MG PO TABS
ORAL_TABLET | ORAL | Status: AC
  Filled 2016-08-02: qty 2

## 2016-08-02 MED ORDER — SODIUM CHLORIDE 0.9 % IJ SOLN
10.0000 mL | INTRAMUSCULAR | Status: DC | PRN
  Administered 2016-08-02: 10 mL
  Filled 2016-08-02: qty 10

## 2016-08-02 MED ORDER — ONDANSETRON HCL 4 MG/2ML IJ SOLN
8.0000 mg | Freq: Once | INTRAMUSCULAR | Status: AC
  Administered 2016-08-02: 8 mg via INTRAVENOUS

## 2016-08-02 MED ORDER — HEPARIN SOD (PORK) LOCK FLUSH 100 UNIT/ML IV SOLN
500.0000 [IU] | Freq: Once | INTRAVENOUS | Status: AC | PRN
  Administered 2016-08-02: 500 [IU]
  Filled 2016-08-02: qty 5

## 2016-08-02 NOTE — Patient Instructions (Signed)
Dehydration, Adult Dehydration is a condition in which there is not enough fluid or water in the body. This happens when you lose more fluids than you take in. Important organs, such as the kidneys, brain, and heart, cannot function without a proper amount of fluids. Any loss of fluids from the body can lead to dehydration. Dehydration can range from mild to severe. This condition should be treated right away to prevent it from becoming severe. What are the causes? This condition may be caused by:  Vomiting.  Diarrhea.  Excessive sweating, such as from heat exposure or exercise.  Not drinking enough fluid, especially:  When ill.  While doing activity that requires a lot of energy.  Excessive urination.  Fever.  Infection.  Certain medicines, such as medicines that cause the body to lose excess fluid (diuretics).  Inability to access safe drinking water.  Reduced physical ability to get adequate water and food. What increases the risk? This condition is more likely to develop in people:  Who have a poorly controlled long-term (chronic) illness, such as diabetes, heart disease, or kidney disease.  Who are age 65 or older.  Who are disabled.  Who live in a place with high altitude.  Who play endurance sports. What are the signs or symptoms? Symptoms of mild dehydration may include:   Thirst.  Dry lips.  Slightly dry mouth.  Dry, warm skin.  Dizziness. Symptoms of moderate dehydration may include:   Very dry mouth.  Muscle cramps.  Dark urine. Urine may be the color of tea.  Decreased urine production.  Decreased tear production.  Heartbeat that is irregular or faster than normal (palpitations).  Headache.  Light-headedness, especially when you stand up from a sitting position.  Fainting (syncope). Symptoms of severe dehydration may include:   Changes in skin, such as:  Cold and clammy skin.  Blotchy (mottled) or pale skin.  Skin that does  not quickly return to normal after being lightly pinched and released (poor skin turgor).  Changes in body fluids, such as:  Extreme thirst.  No tear production.  Inability to sweat when body temperature is high, such as in hot weather.  Very little urine production.  Changes in vital signs, such as:  Weak pulse.  Pulse that is more than 100 beats a minute when sitting still.  Rapid breathing.  Low blood pressure.  Other changes, such as:  Sunken eyes.  Cold hands and feet.  Confusion.  Lack of energy (lethargy).  Difficulty waking up from sleep.  Short-term weight loss.  Unconsciousness. How is this diagnosed? This condition is diagnosed based on your symptoms and a physical exam. Blood and urine tests may be done to help confirm the diagnosis. How is this treated? Treatment for this condition depends on the severity. Mild or moderate dehydration can often be treated at home. Treatment should be started right away. Do not wait until dehydration becomes severe. Severe dehydration is an emergency and it needs to be treated in a hospital. Treatment for mild dehydration may include:   Drinking more fluids.  Replacing salts and minerals in your blood (electrolytes) that you may have lost. Treatment for moderate dehydration may include:   Drinking an oral rehydration solution (ORS). This is a drink that helps you replace fluids and electrolytes (rehydrate). It can be found at pharmacies and retail stores. Treatment for severe dehydration may include:   Receiving fluids through an IV tube.  Receiving an electrolyte solution through a feeding tube that is   passed through your nose and into your stomach (nasogastric tube, or NG tube).  Correcting any abnormalities in electrolytes.  Treating the underlying cause of dehydration. Follow these instructions at home:  If directed by your health care provider, drink an ORS:  Make an ORS by following instructions on the  package.  Start by drinking small amounts, about  cup (120 mL) every 5-10 minutes.  Slowly increase how much you drink until you have taken the amount recommended by your health care provider.  Drink enough clear fluid to keep your urine clear or pale yellow. If you were told to drink an ORS, finish the ORS first, then start slowly drinking other clear fluids. Drink fluids such as:  Water. Do not drink only water. Doing that can lead to having too little salt (sodium) in the body (hyponatremia).  Ice chips.  Fruit juice that you have added water to (diluted fruit juice).  Low-calorie sports drinks.  Avoid:  Alcohol.  Drinks that contain a lot of sugar. These include high-calorie sports drinks, fruit juice that is not diluted, and soda.  Caffeine.  Foods that are greasy or contain a lot of fat or sugar.  Take over-the-counter and prescription medicines only as told by your health care provider.  Do not take sodium tablets. This can lead to having too much sodium in the body (hypernatremia).  Eat foods that contain a healthy balance of electrolytes, such as bananas, oranges, potatoes, tomatoes, and spinach.  Keep all follow-up visits as told by your health care provider. This is important. Contact a health care provider if:  You have abdominal pain that:  Gets worse.  Stays in one area (localizes).  You have a rash.  You have a stiff neck.  You are more irritable than usual.  You are sleepier or more difficult to wake up than usual.  You feel weak or dizzy.  You feel very thirsty.  You have urinated only a small amount of very dark urine over 6-8 hours. Get help right away if:  You have symptoms of severe dehydration.  You cannot drink fluids without vomiting.  Your symptoms get worse with treatment.  You have a fever.  You have a severe headache.  You have vomiting or diarrhea that:  Gets worse.  Does not go away.  You have blood or green matter  (bile) in your vomit.  You have blood in your stool. This may cause stool to look black and tarry.  You have not urinated in 6-8 hours.  You faint.  Your heart rate while sitting still is over 100 beats a minute.  You have trouble breathing. This information is not intended to replace advice given to you by your health care provider. Make sure you discuss any questions you have with your health care provider. Document Released: 08/21/2005 Document Revised: 03/17/2016 Document Reviewed: 10/15/2015 [employer]vier Interactive Patient Education  2017 [employer]  

## 2016-08-04 ENCOUNTER — Telehealth: Payer: Self-pay | Admitting: *Deleted

## 2016-08-04 ENCOUNTER — Ambulatory Visit (HOSPITAL_BASED_OUTPATIENT_CLINIC_OR_DEPARTMENT_OTHER): Payer: No Typology Code available for payment source | Admitting: Hematology and Oncology

## 2016-08-04 ENCOUNTER — Telehealth: Payer: Self-pay | Admitting: Hematology and Oncology

## 2016-08-04 ENCOUNTER — Encounter: Payer: Self-pay | Admitting: *Deleted

## 2016-08-04 ENCOUNTER — Ambulatory Visit (HOSPITAL_BASED_OUTPATIENT_CLINIC_OR_DEPARTMENT_OTHER): Payer: No Typology Code available for payment source

## 2016-08-04 ENCOUNTER — Encounter: Payer: Self-pay | Admitting: Hematology and Oncology

## 2016-08-04 VITALS — BP 121/92 | HR 78 | Temp 99.0°F | Resp 18

## 2016-08-04 DIAGNOSIS — N179 Acute kidney failure, unspecified: Secondary | ICD-10-CM

## 2016-08-04 DIAGNOSIS — E440 Moderate protein-calorie malnutrition: Secondary | ICD-10-CM

## 2016-08-04 DIAGNOSIS — D61818 Other pancytopenia: Secondary | ICD-10-CM

## 2016-08-04 DIAGNOSIS — C090 Malignant neoplasm of tonsillar fossa: Secondary | ICD-10-CM

## 2016-08-04 MED ORDER — HEPARIN SOD (PORK) LOCK FLUSH 100 UNIT/ML IV SOLN
500.0000 [IU] | Freq: Once | INTRAVENOUS | Status: AC | PRN
  Administered 2016-08-04: 500 [IU]
  Filled 2016-08-04: qty 5

## 2016-08-04 MED ORDER — ACETAMINOPHEN 325 MG PO TABS
ORAL_TABLET | ORAL | Status: AC
  Filled 2016-08-04: qty 2

## 2016-08-04 MED ORDER — ONDANSETRON HCL 4 MG/2ML IJ SOLN
INTRAMUSCULAR | Status: AC
  Filled 2016-08-04: qty 4

## 2016-08-04 MED ORDER — SODIUM CHLORIDE 0.9 % IJ SOLN
10.0000 mL | INTRAMUSCULAR | Status: DC | PRN
  Administered 2016-08-04: 10 mL
  Filled 2016-08-04: qty 10

## 2016-08-04 MED ORDER — ONDANSETRON HCL 4 MG/2ML IJ SOLN
8.0000 mg | Freq: Once | INTRAMUSCULAR | Status: AC
  Administered 2016-08-04: 8 mg via INTRAVENOUS

## 2016-08-04 MED ORDER — SODIUM CHLORIDE 0.9 % IV SOLN
1000.0000 mL | Freq: Once | INTRAVENOUS | Status: AC
  Administered 2016-08-04: 1000 mL via INTRAVENOUS

## 2016-08-04 MED ORDER — ACETAMINOPHEN 325 MG PO TABS
650.0000 mg | ORAL_TABLET | Freq: Once | ORAL | Status: AC
  Administered 2016-08-04: 650 mg via ORAL

## 2016-08-04 MED ORDER — SODIUM CHLORIDE 0.9 % IV SOLN: Freq: Once | INTRAVENOUS | Status: DC

## 2016-08-04 NOTE — Telephone Encounter (Signed)
Message sent to infusion scheduler to be added. Appointments scheduled per 08/04/16 los. Patient will receive copy of AVS report and appointment schedule in infusion.

## 2016-08-04 NOTE — Patient Instructions (Signed)
Dehydration, Adult Dehydration is a condition in which there is not enough fluid or water in the body. This happens when you lose more fluids than you take in. Important organs, such as the kidneys, brain, and heart, cannot function without a proper amount of fluids. Any loss of fluids from the body can lead to dehydration. Dehydration can range from mild to severe. This condition should be treated right away to prevent it from becoming severe. What are the causes? This condition may be caused by:  Vomiting.  Diarrhea.  Excessive sweating, such as from heat exposure or exercise.  Not drinking enough fluid, especially:  When ill.  While doing activity that requires a lot of energy.  Excessive urination.  Fever.  Infection.  Certain medicines, such as medicines that cause the body to lose excess fluid (diuretics).  Inability to access safe drinking water.  Reduced physical ability to get adequate water and food. What increases the risk? This condition is more likely to develop in people:  Who have a poorly controlled long-term (chronic) illness, such as diabetes, heart disease, or kidney disease.  Who are age 65 or older.  Who are disabled.  Who live in a place with high altitude.  Who play endurance sports. What are the signs or symptoms? Symptoms of mild dehydration may include:   Thirst.  Dry lips.  Slightly dry mouth.  Dry, warm skin.  Dizziness. Symptoms of moderate dehydration may include:   Very dry mouth.  Muscle cramps.  Dark urine. Urine may be the color of tea.  Decreased urine production.  Decreased tear production.  Heartbeat that is irregular or faster than normal (palpitations).  Headache.  Light-headedness, especially when you stand up from a sitting position.  Fainting (syncope). Symptoms of severe dehydration may include:   Changes in skin, such as:  Cold and clammy skin.  Blotchy (mottled) or pale skin.  Skin that does  not quickly return to normal after being lightly pinched and released (poor skin turgor).  Changes in body fluids, such as:  Extreme thirst.  No tear production.  Inability to sweat when body temperature is high, such as in hot weather.  Very little urine production.  Changes in vital signs, such as:  Weak pulse.  Pulse that is more than 100 beats a minute when sitting still.  Rapid breathing.  Low blood pressure.  Other changes, such as:  Sunken eyes.  Cold hands and feet.  Confusion.  Lack of energy (lethargy).  Difficulty waking up from sleep.  Short-term weight loss.  Unconsciousness. How is this diagnosed? This condition is diagnosed based on your symptoms and a physical exam. Blood and urine tests may be done to help confirm the diagnosis. How is this treated? Treatment for this condition depends on the severity. Mild or moderate dehydration can often be treated at home. Treatment should be started right away. Do not wait until dehydration becomes severe. Severe dehydration is an emergency and it needs to be treated in a hospital. Treatment for mild dehydration may include:   Drinking more fluids.  Replacing salts and minerals in your blood (electrolytes) that you may have lost. Treatment for moderate dehydration may include:   Drinking an oral rehydration solution (ORS). This is a drink that helps you replace fluids and electrolytes (rehydrate). It can be found at pharmacies and retail stores. Treatment for severe dehydration may include:   Receiving fluids through an IV tube.  Receiving an electrolyte solution through a feeding tube that is   passed through your nose and into your stomach (nasogastric tube, or NG tube).  Correcting any abnormalities in electrolytes.  Treating the underlying cause of dehydration. Follow these instructions at home:  If directed by your health care provider, drink an ORS:  Make an ORS by following instructions on the  package.  Start by drinking small amounts, about  cup (120 mL) every 5-10 minutes.  Slowly increase how much you drink until you have taken the amount recommended by your health care provider.  Drink enough clear fluid to keep your urine clear or pale yellow. If you were told to drink an ORS, finish the ORS first, then start slowly drinking other clear fluids. Drink fluids such as:  Water. Do not drink only water. Doing that can lead to having too little salt (sodium) in the body (hyponatremia).  Ice chips.  Fruit juice that you have added water to (diluted fruit juice).  Low-calorie sports drinks.  Avoid:  Alcohol.  Drinks that contain a lot of sugar. These include high-calorie sports drinks, fruit juice that is not diluted, and soda.  Caffeine.  Foods that are greasy or contain a lot of fat or sugar.  Take over-the-counter and prescription medicines only as told by your health care provider.  Do not take sodium tablets. This can lead to having too much sodium in the body (hypernatremia).  Eat foods that contain a healthy balance of electrolytes, such as bananas, oranges, potatoes, tomatoes, and spinach.  Keep all follow-up visits as told by your health care provider. This is important. Contact a health care provider if:  You have abdominal pain that:  Gets worse.  Stays in one area (localizes).  You have a rash.  You have a stiff neck.  You are more irritable than usual.  You are sleepier or more difficult to wake up than usual.  You feel weak or dizzy.  You feel very thirsty.  You have urinated only a small amount of very dark urine over 6-8 hours. Get help right away if:  You have symptoms of severe dehydration.  You cannot drink fluids without vomiting.  Your symptoms get worse with treatment.  You have a fever.  You have a severe headache.  You have vomiting or diarrhea that:  Gets worse.  Does not go away.  You have blood or green matter  (bile) in your vomit.  You have blood in your stool. This may cause stool to look black and tarry.  You have not urinated in 6-8 hours.  You faint.  Your heart rate while sitting still is over 100 beats a minute.  You have trouble breathing. This information is not intended to replace advice given to you by your health care provider. Make sure you discuss any questions you have with your health care provider. Document Released: 08/21/2005 Document Revised: 03/17/2016 Document Reviewed: 10/15/2015 [employer]vier Interactive Patient Education  2017 [employer]  

## 2016-08-04 NOTE — Assessment & Plan Note (Signed)
He had completed recent chemotherapy. He is not symptomatic from pancytopenia. Recommend close observation I plan to recheck his blood count again next week

## 2016-08-04 NOTE — Progress Notes (Signed)
Patient blood pressure 125/92, temperature 99.0 Dr. Alvy Bimler notified. Ok to proceed with fluids today.

## 2016-08-04 NOTE — Assessment & Plan Note (Signed)
He is doing better. I encouraged him to eat by mouth. He is not able to gain much weight. I recommend we continue IV fluid support 3 times a week for the next 3 weeks and dietitian follow-up

## 2016-08-04 NOTE — Progress Notes (Signed)
Oncology Nurse Navigator Documentation  Met [name] [name] in Infusion where he was receiving scheduled IVF. He reported doing well though has not been able to gain weight.  He noted Dr. Alvy Bimler encouraged increasing intake of nutritional supplement to  7+ cans/daily. I discussed with him the April '18 [facility] series for which he will receive information. I provided [name] [name] a note of support/encouragement written by a previous patient who attended [facility]. He denied needs/concerns, understands I can be contacted should that change.  Gayleen Orem, RN, BSN, [place] Oncology Nurse [place] at [place] [phone]

## 2016-08-04 NOTE — Progress Notes (Signed)
[facility] OFFICE PROGRESS NOTE  Patient Care Team: Rogers Blocker, MD as PCP - General (Internal Medicine) Heath Lark, MD as Consulting Physician (Hematology and Oncology) Jodi Marble, MD as Consulting Physician (Otolaryngology) Leota Sauers, RN as Oncology Nurse Navigator Eppie Gibson, MD as Attending Physician (Radiation Oncology) Lenn Cal, DDS as Consulting Physician (Dentistry) Karie Mainland, RD as Dietitian (Nutrition)  SUMMARY OF ONCOLOGIC HISTORY:   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[place])   04/04/2016 Initial Diagnosis    He saw ENT and had laryngoscopy and biopsy of left tonsil      04/04/2016 Pathology Results    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 Chronic small-vessel ischemic changes of the cerebral hemispheric white matter.       04/04/2016 Imaging    CT neck showed left tonsillar mass with diameter of 19 x 24 mm consistent with 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 necrosis. Suspicious level 5/supraclavicular nodes on the left. Scar type density in the right upper lobe is more prominent than was seen on a CT scan of the chest 10/06/2014      04/21/2016 PET scan    Asymmetric hypermetabolic soft tissue prominence in left palatine tonsil, consistent with known primary left tonsillar squamous cell Carcinoma. Left level 2 cervical lymphadenopathy, consistent with metastatic disease. No evidence of metastatic disease within the chest, abdomen, or pelvis. Stable 14 mm ground-glass and part solid nodular opacity in right lung apex compared to previous CT in 2016. This shows low-grade metabolic activity. Low-grade bronchogenic adenocarcinoma cannot be excluded. Consider surgical resection versus continued followup by CT in 12 months      05/17/2016 Procedure    Successful placement of a 20 French pull  through gastrostomy tube & right IJ approach [facility].      05/22/2016 - 07/03/2016 Chemotherapy    He received 3 doses of high dose cisplatin with radiation       05/23/2016 -  Radiation Therapy    He received concurrent radiation therapy       INTERVAL HISTORY: Please see below for problem oriented charting. He is seen in the infusion room. He is not doing well. He could remain pancytopenic and with persistent renal failure He is not taking much by mouth due to altered taste sensation. He denies recent hemoptysis. No recent fever or chills. He denies pain. He is only tolerating 5 cans of nutritional supplement per day with some oral fluid intake He denies nausea or diarrhea  REVIEW OF SYSTEMS:   Constitutional: Denies fevers, chills  Eyes: Denies blurriness of vision Ears, nose, mouth, throat, and face: Denies mucositis or sore throat Respiratory: Denies cough, dyspnea or wheezes Cardiovascular: Denies palpitation, chest discomfort or lower extremity swelling Gastrointestinal:  Denies nausea, heartburn or change in bowel habits Skin: Denies abnormal skin rashes Lymphatics: Denies new lymphadenopathy or easy bruising Neurological:Denies numbness, tingling or new weaknesses Behavioral/Psych: Mood is stable, no new changes  All other systems were reviewed with the patient and are negative.  I have reviewed the past medical history, past surgical history, social history and family history with the patient and they are unchanged from previous note.  ALLERGIES:  has No Known Allergies.  MEDICATIONS:  Current Outpatient Prescriptions  Medication Sig Dispense Refill  . Ethyl Alcohol, Skin Cleanser, (PURELL INSTANT HAND SANITIZER) 65 % FOAM Use as  needed to sanitize hands 160 mL 5  . lidocaine-prilocaine (EMLA) cream Apply to affected area once 30 g 3  . Morphine Sulfate (MORPHINE CONCENTRATE) 10 mg / 0.5 ml concentrated solution Take 0.5 mLs (10 mg total) by mouth every 2  (two) hours as needed for severe pain. 240 mL 0  . Nutritional Supplements (FEEDING SUPPLEMENT, OSMOLITE 1.5 CAL,) LIQD Begin Osmolite 1.5 via tube at 60 ml/hr from 4pm -6am as tolerated. Increase 10 cc every 4 hours as tolerated to goal of 120 cc/hr. Flush feeding tube with 120 cc free water before and after continuous feeding. Give 240 cc free water 4 times daily at  8 am,10 am, 12 noon and 2 pm. 1659 mL 0  . ondansetron (ZOFRAN) 8 MG tablet Take 1 tablet (8 mg total) by mouth every 8 (eight) hours as needed. Start on the third day after chemotherapy. 60 tablet 1  . polyethylene glycol (MIRALAX) packet Take 17 g by mouth daily. 14 each 9  . prochlorperazine (COMPAZINE) 10 MG tablet Take 1 tablet (10 mg total) by mouth every 6 (six) hours as needed (Nausea or vomiting). 90 tablet 3  . sodium fluoride (FLUORISHIELD) 1.1 % GEL dental gel Instill one drop of gel per tooth space of fluoride tray. Place over teeth for 5 minutes. Remove. Spit out excess. Repeat nightly. 120 mL prn   Current Facility-Administered Medications  Medication Dose Route Frequency Provider Last Rate Last Dose  . dexamethasone (DECADRON) injection 10 mg  10 mg Intravenous Once Heath Lark, MD      . LORazepam (ATIVAN) injection 0.5 mg  0.5 mg Intravenous Once Heath Lark, MD       Facility-Administered Medications Ordered in Other Visits  Medication Dose Route Frequency Provider Last Rate Last Dose  . 0.9 %  sodium chloride infusion  1,000 mL Intravenous Once Heath Lark, MD 500 mL/hr at 08/04/16 0805 1,000 mL at 08/04/16 0805  . heparin lock flush 100 unit/mL  500 Units Intracatheter Once PRN Heath Lark, MD      . sodium chloride 0.9 % injection 10 mL  10 mL Intracatheter PRN Heath Lark, MD      . sodium chloride flush (NS) 0.9 % injection 10 mL  10 mL Intracatheter PRN Heath Lark, MD   10 mL at 05/24/16 1615    PHYSICAL EXAMINATION: ECOG PERFORMANCE STATUS: 1 - Symptomatic but completely ambulatory GENERAL:alert, no distress  and comfortable. He looks thin and cachectic SKIN: skin color, texture, turgor are normal, no rashes or significant lesions EYES: normal, Conjunctiva are pink and non-injected, sclera clear OROPHARYNX:no exudate, no erythema and lips, buccal mucosa, and tongue normal  NECK: supple, thyroid normal size, non-tender, without nodularity LYMPH:  no palpable lymphadenopathy in the cervical, axillary or inguinal LUNGS: clear to auscultation and percussion with normal breathing effort HEART: regular rate & rhythm and no murmurs and no lower extremity edema ABDOMEN:abdomen soft, non-tender and normal bowel sounds Musculoskeletal:no cyanosis of digits and no clubbing  NEURO: alert & oriented x 3 with fluent speech, no focal motor/sensory deficits  LABORATORY DATA:  I have reviewed the data as listed    Component Value Date/Time   NA 138 07/31/2016 0842   K 4.2 07/31/2016 0842   CL 105 05/17/2016 1250   CO2 29 07/31/2016 0842   GLUCOSE 87 07/31/2016 0842   BUN 21.2 07/31/2016 0842   CREATININE 1.7 (H) 07/31/2016 0842   CALCIUM 9.6 07/31/2016 0842   PROT 7.1 07/31/2016 0842   ALBUMIN  3.4 (L) 07/31/2016 0842   AST 19 07/31/2016 0842   ALT 11 07/31/2016 0842   ALKPHOS 96 07/31/2016 0842   BILITOT 0.29 07/31/2016 0842   GFRNONAA >60 05/17/2016 1250   GFRAA >60 05/17/2016 1250    No results found for: SPEP, UPEP  Lab Results  Component Value Date   WBC 1.8 (L) 07/31/2016   NEUTROABS 0.9 (L) 07/31/2016   HGB 9.5 (L) 07/31/2016   HCT 27.6 (L) 07/31/2016   MCV 91.3 07/31/2016   PLT 312 07/31/2016      Chemistry      Component Value Date/Time   NA 138 07/31/2016 0842   K 4.2 07/31/2016 0842   CL 105 05/17/2016 1250   CO2 29 07/31/2016 0842   BUN 21.2 07/31/2016 0842   CREATININE 1.7 (H) 07/31/2016 0842      Component Value Date/Time   CALCIUM 9.6 07/31/2016 0842   ALKPHOS 96 07/31/2016 0842   AST 19 07/31/2016 0842   ALT 11 07/31/2016 0842   BILITOT 0.29 07/31/2016 0842      ASSESSMENT & PLAN:  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H[facility]) He tolerated treatment well with worsening expected side effects. He will continue to be seen on a weekly basis for supportive care  Pancytopenia, acquired [facility]) He had completed recent chemotherapy. He is not symptomatic from pancytopenia. Recommend close observation I plan to recheck his blood count again next week  Protein-calorie malnutrition, moderate ([place]) He is doing better. I encouraged him to eat by mouth. He is not able to gain much weight. I recommend we continue IV fluid support 3 times a week for the next 3 weeks and dietitian follow-up  Acute prerenal failure ([place]) This is related to recent poor oral intake and dehydration. I will continue IV fluid support and continue close monitoring of his kidney function weekly   No orders of the defined types were placed in this encounter.  All questions were answered. The patient knows to call the clinic with any problems, questions or concerns. No barriers to learning was detected. I spent 20 minutes counseling the patient face to face. The total time spent in the appointment was 25 minutes and more than 50% was on counseling and review of test results     Heath Lark, MD 08/04/2016 8:46 AM

## 2016-08-04 NOTE — Assessment & Plan Note (Signed)
This is related to recent poor oral intake and dehydration. I will continue IV fluid support and continue close monitoring of his kidney function weekly

## 2016-08-04 NOTE — Telephone Encounter (Signed)
Per LOS I have scheduled appts and notified the scheduler 

## 2016-08-04 NOTE — Telephone Encounter (Signed)
Per LOS I h ave scheduled appts and notiffied the scheduler

## 2016-08-04 NOTE — Assessment & Plan Note (Signed)
He tolerated treatment well with worsening expected side effects. He will continue to be seen on a weekly basis for supportive care

## 2016-08-05 ENCOUNTER — Ambulatory Visit: Payer: Self-pay

## 2016-08-07 ENCOUNTER — Ambulatory Visit: Payer: Medicaid Other | Attending: Radiation Oncology

## 2016-08-07 ENCOUNTER — Other Ambulatory Visit: Payer: Self-pay

## 2016-08-07 ENCOUNTER — Ambulatory Visit: Payer: Self-pay

## 2016-08-09 ENCOUNTER — Ambulatory Visit (HOSPITAL_BASED_OUTPATIENT_CLINIC_OR_DEPARTMENT_OTHER): Payer: No Typology Code available for payment source

## 2016-08-09 ENCOUNTER — Encounter (HOSPITAL_COMMUNITY): Payer: Self-pay | Admitting: Dentistry

## 2016-08-09 ENCOUNTER — Encounter: Payer: Self-pay | Admitting: Radiation Oncology

## 2016-08-09 ENCOUNTER — Ambulatory Visit (HOSPITAL_COMMUNITY): Payer: No Typology Code available for payment source | Admitting: Dentistry

## 2016-08-09 VITALS — BP 112/74 | HR 90 | Temp 98.6°F | Resp 18

## 2016-08-09 VITALS — BP 115/70 | HR 75 | Temp 98.1°F | Wt 150.0 lb

## 2016-08-09 DIAGNOSIS — C090 Malignant neoplasm of tonsillar fossa: Secondary | ICD-10-CM

## 2016-08-09 DIAGNOSIS — K08409 Partial loss of teeth, unspecified cause, unspecified class: Secondary | ICD-10-CM

## 2016-08-09 DIAGNOSIS — R432 Parageusia: Secondary | ICD-10-CM

## 2016-08-09 DIAGNOSIS — Z9221 Personal history of antineoplastic chemotherapy: Secondary | ICD-10-CM

## 2016-08-09 DIAGNOSIS — C099 Malignant neoplasm of tonsil, unspecified: Secondary | ICD-10-CM

## 2016-08-09 DIAGNOSIS — K030 Excessive attrition of teeth: Secondary | ICD-10-CM

## 2016-08-09 DIAGNOSIS — R1310 Dysphagia, unspecified: Secondary | ICD-10-CM

## 2016-08-09 DIAGNOSIS — R682 Dry mouth, unspecified: Secondary | ICD-10-CM

## 2016-08-09 DIAGNOSIS — K117 Disturbances of salivary secretion: Secondary | ICD-10-CM

## 2016-08-09 DIAGNOSIS — Z923 Personal history of irradiation: Secondary | ICD-10-CM

## 2016-08-09 DIAGNOSIS — Z5189 Encounter for other specified aftercare: Secondary | ICD-10-CM

## 2016-08-09 DIAGNOSIS — E440 Moderate protein-calorie malnutrition: Secondary | ICD-10-CM

## 2016-08-09 LAB — CBC WITH DIFFERENTIAL/PLATELET
BASO%: 0.2 % (ref 0.0–2.0)
BASOS ABS: 0 10*3/uL (ref 0.0–0.1)
EOS ABS: 0 10*3/uL (ref 0.0–0.5)
EOS%: 0.1 % (ref 0.0–7.0)
HCT: 28.8 % — ABNORMAL LOW (ref 38.4–49.9)
HGB: 9.8 g/dL — ABNORMAL LOW (ref 13.0–17.1)
LYMPH%: 10.6 % — AB (ref 14.0–49.0)
MCH: 31.9 pg (ref 27.2–33.4)
MCHC: 34.2 g/dL (ref 32.0–36.0)
MCV: 93.3 fL (ref 79.3–98.0)
MONO#: 1 10*3/uL — ABNORMAL HIGH (ref 0.1–0.9)
MONO%: 23.8 % — AB (ref 0.0–14.0)
NEUT#: 2.7 10*3/uL (ref 1.5–6.5)
NEUT%: 65.3 % (ref 39.0–75.0)
Platelets: 282 10*3/uL (ref 140–400)
RBC: 3.09 10*6/uL — AB (ref 4.20–5.82)
RDW: 17.7 % — ABNORMAL HIGH (ref 11.0–14.6)
WBC: 4.1 10*3/uL (ref 4.0–10.3)
lymph#: 0.4 10*3/uL — ABNORMAL LOW (ref 0.9–3.3)

## 2016-08-09 LAB — COMPREHENSIVE METABOLIC PANEL
ALT: 10 U/L (ref 0–55)
AST: 17 U/L (ref 5–34)
Albumin: 3.3 g/dL — ABNORMAL LOW (ref 3.5–5.0)
Alkaline Phosphatase: 87 U/L (ref 40–150)
Anion Gap: 9 mEq/L (ref 3–11)
BUN: 19.4 mg/dL (ref 7.0–26.0)
CHLORIDE: 102 meq/L (ref 98–109)
CO2: 29 meq/L (ref 22–29)
CREATININE: 1.5 mg/dL — AB (ref 0.7–1.3)
Calcium: 9.3 mg/dL (ref 8.4–10.4)
EGFR: 59 mL/min/{1.73_m2} — ABNORMAL LOW (ref >90)
GLUCOSE: 93 mg/dL (ref 70–140)
POTASSIUM: 4.2 meq/L (ref 3.5–5.1)
SODIUM: 139 meq/L (ref 136–145)
Total Bilirubin: <0.22 mg/dL (ref 0.20–1.20)
Total Protein: 6.9 g/dL (ref 6.4–8.3)

## 2016-08-09 MED ORDER — SODIUM CHLORIDE 0.9 % IV SOLN: Freq: Once | INTRAVENOUS | Status: DC

## 2016-08-09 MED ORDER — ACETAMINOPHEN 325 MG PO TABS
650.0000 mg | ORAL_TABLET | Freq: Four times a day (QID) | ORAL | Status: DC | PRN
  Administered 2016-08-09: 650 mg via ORAL

## 2016-08-09 MED ORDER — ACETAMINOPHEN 325 MG PO TABS
ORAL_TABLET | ORAL | Status: AC
  Filled 2016-08-09: qty 2

## 2016-08-09 MED ORDER — ONDANSETRON HCL 4 MG/2ML IJ SOLN
INTRAMUSCULAR | Status: AC
  Filled 2016-08-09: qty 4

## 2016-08-09 MED ORDER — ONDANSETRON HCL 4 MG/2ML IJ SOLN
8.0000 mg | Freq: Once | INTRAMUSCULAR | Status: AC
  Administered 2016-08-09: 8 mg via INTRAVENOUS

## 2016-08-09 MED ORDER — SODIUM CHLORIDE 0.9 % IJ SOLN
10.0000 mL | INTRAMUSCULAR | Status: DC | PRN
  Administered 2016-08-09: 10 mL
  Filled 2016-08-09: qty 10

## 2016-08-09 MED ORDER — HEPARIN SOD (PORK) LOCK FLUSH 100 UNIT/ML IV SOLN
500.0000 [IU] | Freq: Once | INTRAVENOUS | Status: AC | PRN
  Administered 2016-08-09: 500 [IU]
  Filled 2016-08-09: qty 5

## 2016-08-09 MED ORDER — SODIUM CHLORIDE 0.9 % IV SOLN
Freq: Once | INTRAVENOUS | Status: AC
  Administered 2016-08-09: 13:00:00 via INTRAVENOUS

## 2016-08-09 NOTE — Progress Notes (Signed)
Patient stated that on 08/05/2016 thru 08/07/2016 he awoke to a heaviness and inability to move walk without having to grip the wall or use support.  It lasted for 2 days in which he did not seek medical attention and resolved on its own.  During this time he missed his Saturday and Monday appointment.  Patient concerned about what may have caused this.

## 2016-08-09 NOTE — Progress Notes (Signed)
08/09/2016  Patient Name:   [name] [name] Date of Birth:   March 15, 1956 Medical Record Number: [id number]  BP 115/70 (BP Location: Left Arm)   Pulse 75   Temp 98.1 F (36.7 C) (Oral)   Wt 150 lb (68 kg)   BMI 19.79 kg/m   [name] presents for oral examination after chemoradiation therapy. Patient has completed all radiation treatments from 05/23/16 thru 07/10/16. Patient has had three chemotherapy treatments. Patient indicates that he recently saw the dentist have multiple dental resin restorations placed. The patient cannot remember the name of the dentist he saw.  REVIEW OF CHIEF COMPLAINTS:  DRY MOUTH: Yes HARD TO SWALLOW: Yes, at times.  HURT TO SWALLOW: Yes, at times TASTE CHANGES: Taste is returning. SORES IN MOUTH: No TRISMUS: No problems with trismus symptoms WEIGHT: 150 pounds  HOME OH REGIMEN:  BRUSHING: Twice a day FLOSSING: Once a day RINSING: Using salt water rinses. FLUORIDE: Using fluoride at bedtime TRISMUS EXERCISES:  Maximum interincisal opening: 45 mm   DENTAL EXAM:  Oral Hygiene:(PLAQUE): Lack noted. Oral hygiene improvement was highly suggested.  LOCATION OF MUCOSITIS: None noted DESCRIPTION OF SALIVA: Decreased and foamy saliva. ANY EXPOSED BONE: None noted OTHER WATCHED AREAS: Previous extraction sites, incisal attrition, multiple missing teeth. Multiple resin restorations were recently placed consistent with patient's comments above. DX :  1. Xerostomia 2. Dysgeusia-resolving 3. Intermittent dysphagia and odynophagia 4. Multiple areas of incisal attrition recently restored with resin restorations. 5. Multiple missing teeth  RECOMMENDATIONS: 1. Brush after meals and at bedtime.  Use fluoride at bedtime. 2. Use trismus exercises as directed. 3. Use Biotene Rinse or salt water/baking soda rinses. 4. Multiple sips of water as needed. 5. Follow up with his new primary dentist for exam, cleaning and evaluation of other treatment needs.  Will send records after release of information completed.  Lenn Cal, DDS

## 2016-08-09 NOTE — Patient Instructions (Addendum)
RECOMMENDATIONS: 1. Brush after meals and at bedtime.  Use fluoride at bedtime. 2. Use trismus exercises as directed. 3. Use Biotene Rinse or salt water/baking soda rinses. 4. Multiple sips of water as needed. 5. Follow up with a new primary dentist for exam, cleaning and evaluation of other treatment needs. Will send records after release of information completed. 6. Do not have partial dentures fabricated for another 2 months.  Lenn Cal, DDS

## 2016-08-11 ENCOUNTER — Ambulatory Visit
Admission: RE | Admit: 2016-08-11 | Discharge: 2016-08-11 | Disposition: A | Discharge status: Home / Self Care | Payer: No Typology Code available for payment source | Source: Ambulatory Visit | Attending: Radiation Oncology | Admitting: Radiation Oncology

## 2016-08-11 ENCOUNTER — Ambulatory Visit: Payer: Self-pay | Admitting: Hematology and Oncology

## 2016-08-11 ENCOUNTER — Ambulatory Visit: Payer: Self-pay

## 2016-08-11 HISTORY — DX: Personal history of irradiation: Z92.3

## 2016-08-14 ENCOUNTER — Other Ambulatory Visit: Payer: Self-pay

## 2016-08-14 ENCOUNTER — Telehealth: Payer: Self-pay | Admitting: *Deleted

## 2016-08-14 ENCOUNTER — Ambulatory Visit: Payer: Self-pay

## 2016-08-14 NOTE — Telephone Encounter (Signed)
I have no available spot to add him on until 12/20 I place scheduling msg to reschedule

## 2016-08-14 NOTE — Telephone Encounter (Signed)
Oncology Nurse Navigator Documentation  Called Mr. Karber in follow-up to call from [place], Infusion, indicating Mr. Buckbee was a No Show for 0815 IVF.    He indicated he was unable to keep appt bc he is in process of getting a car.  He offered same explanation for missed appts last Friday.  He stated city bus was not reliable last week which is why he is looking into purchasing a car.  He further noted his legs began "feeling weak" last week; other wise he is "OK".  I encouraged him to come in today to be seen.   He declined, stated he will keep Wednesday IVF.  He understands I will reschedule this morning's lab for Wednesday morning.  Gayleen Orem, RN, BSN, [place] Oncology Nurse [place] at [place] [phone]

## 2016-08-15 ENCOUNTER — Telehealth (HOSPITAL_COMMUNITY): Payer: Self-pay | Admitting: *Deleted

## 2016-08-15 NOTE — Telephone Encounter (Signed)
Patient given detailed instructions per Myocardial Perfusion Study Information Sheet for the test on 08/18/16 at 0715. Patient notified to arrive 15 minutes early and that it is imperative to arrive on time for appointment to keep from having the test rescheduled.  If you need to cancel or reschedule your appointment, please call the office within 24 hours of your appointment. Failure to do so may result in a cancellation of your appointment, and a $50 no show fee. Patient verbalized understanding.Camrynn Mcclintic, [place]

## 2016-08-16 ENCOUNTER — Encounter: Payer: Self-pay | Admitting: *Deleted

## 2016-08-16 ENCOUNTER — Other Ambulatory Visit (HOSPITAL_BASED_OUTPATIENT_CLINIC_OR_DEPARTMENT_OTHER): Payer: No Typology Code available for payment source

## 2016-08-16 ENCOUNTER — Ambulatory Visit (HOSPITAL_BASED_OUTPATIENT_CLINIC_OR_DEPARTMENT_OTHER): Payer: No Typology Code available for payment source

## 2016-08-16 ENCOUNTER — Telehealth: Payer: Self-pay | Admitting: *Deleted

## 2016-08-16 VITALS — BP 105/68 | HR 71 | Temp 98.9°F | Resp 17 | Wt 138.5 lb

## 2016-08-16 DIAGNOSIS — C090 Malignant neoplasm of tonsillar fossa: Secondary | ICD-10-CM

## 2016-08-16 LAB — COMPREHENSIVE METABOLIC PANEL
ALT: 15 U/L (ref 0–55)
ANION GAP: 10 meq/L (ref 3–11)
AST: 25 U/L (ref 5–34)
Albumin: 3.9 g/dL (ref 3.5–5.0)
Alkaline Phosphatase: 99 U/L (ref 40–150)
BILIRUBIN TOTAL: 0.53 mg/dL (ref 0.20–1.20)
BUN: 27.3 mg/dL — ABNORMAL HIGH (ref 7.0–26.0)
CALCIUM: 10.2 mg/dL (ref 8.4–10.4)
CHLORIDE: 99 meq/L (ref 98–109)
CO2: 26 mEq/L (ref 22–29)
CREATININE: 1.9 mg/dL — AB (ref 0.7–1.3)
EGFR: 44 mL/min/{1.73_m2} — AB (ref >90)
Glucose: 107 mg/dl (ref 70–140)
Potassium: 5 mEq/L (ref 3.5–5.1)
Sodium: 135 mEq/L — ABNORMAL LOW (ref 136–145)
Total Protein: 8.2 g/dL (ref 6.4–8.3)

## 2016-08-16 LAB — CBC WITH DIFFERENTIAL/PLATELET
BASO%: 0.5 % (ref 0.0–2.0)
Basophils Absolute: 0 10*3/uL (ref 0.0–0.1)
EOS ABS: 0 10*3/uL (ref 0.0–0.5)
EOS%: 0.7 % (ref 0.0–7.0)
HEMATOCRIT: 35.1 % — AB (ref 38.4–49.9)
HGB: 11.9 g/dL — ABNORMAL LOW (ref 13.0–17.1)
LYMPH#: 0.8 10*3/uL — AB (ref 0.9–3.3)
LYMPH%: 13.9 % — ABNORMAL LOW (ref 14.0–49.0)
MCH: 32.3 pg (ref 27.2–33.4)
MCHC: 34 g/dL (ref 32.0–36.0)
MCV: 95.1 fL (ref 79.3–98.0)
MONO#: 1.1 10*3/uL — ABNORMAL HIGH (ref 0.1–0.9)
MONO%: 19.9 % — AB (ref 0.0–14.0)
NEUT%: 65 % (ref 39.0–75.0)
NEUTROS ABS: 3.5 10*3/uL (ref 1.5–6.5)
Platelets: 278 10*3/uL (ref 140–400)
RBC: 3.69 10*6/uL — ABNORMAL LOW (ref 4.20–5.82)
RDW: 18.7 % — AB (ref 11.0–14.6)
WBC: 5.4 10*3/uL (ref 4.0–10.3)

## 2016-08-16 MED ORDER — HEPARIN SOD (PORK) LOCK FLUSH 100 UNIT/ML IV SOLN
500.0000 [IU] | Freq: Once | INTRAVENOUS | Status: AC | PRN
  Administered 2016-08-16: 500 [IU]
  Filled 2016-08-16: qty 5

## 2016-08-16 MED ORDER — SODIUM CHLORIDE 0.9 % IV SOLN: Freq: Once | INTRAVENOUS | Status: DC

## 2016-08-16 MED ORDER — SODIUM CHLORIDE 0.9 % IJ SOLN
10.0000 mL | INTRAMUSCULAR | Status: DC | PRN
  Administered 2016-08-16: 10 mL
  Filled 2016-08-16: qty 10

## 2016-08-16 MED ORDER — ACETAMINOPHEN 325 MG PO TABS
650.0000 mg | ORAL_TABLET | Freq: Once | ORAL | Status: AC
  Administered 2016-08-16: 650 mg via ORAL

## 2016-08-16 MED ORDER — ACETAMINOPHEN 325 MG PO TABS
ORAL_TABLET | ORAL | Status: AC
  Filled 2016-08-16: qty 2

## 2016-08-16 MED ORDER — ONDANSETRON HCL 4 MG/2ML IJ SOLN
8.0000 mg | Freq: Once | INTRAMUSCULAR | Status: AC
  Administered 2016-08-16: 8 mg via INTRAVENOUS

## 2016-08-16 MED ORDER — ONDANSETRON HCL 4 MG/2ML IJ SOLN
INTRAMUSCULAR | Status: AC
  Filled 2016-08-16: qty 2

## 2016-08-16 MED ORDER — SODIUM CHLORIDE 0.9 % IV SOLN
Freq: Once | INTRAVENOUS | Status: AC
  Administered 2016-08-16: 11:00:00 via INTRAVENOUS

## 2016-08-16 MED FILL — predniSONE 10 MG TABS: 10 | 30 days supply | Qty: 30 | Fill #0

## 2016-08-16 NOTE — Telephone Encounter (Signed)
Oncology Nurse Navigator Documentation  Per Dr. Alvy Bimler, called [place], spoke with [facility], provided verbal Rx for Prednisone 10 mg, 30 tabs, no refills, 1 tab daily in AM with food.  I explained Rx to Mr. Hilborn in Infusion as he was finishing IVF, he understands to pick-up Rx enroute home.  Gayleen Orem, RN, BSN, [place] Oncology Nurse [place] at [place] [phone]

## 2016-08-16 NOTE — Patient Instructions (Signed)
Dehydration, Adult Dehydration is when there is not enough fluid or water in your body. This happens when you lose more fluids than you take in. Dehydration can range from mild to very bad. It should be treated right away to keep it from getting very bad. Symptoms of mild dehydration may include:   Thirst.  Dry lips.  Slightly dry mouth.  Dry, warm skin.  Dizziness. Symptoms of moderate dehydration may include:   Very dry mouth.  Muscle cramps.  Dark pee (urine). Pee may be the color of tea.  Your body making less pee.  Your eyes making fewer tears.  Heartbeat that is uneven or faster than normal (palpitations).  Headache.  Light-headedness, especially when you stand up from sitting.  Fainting (syncope). Symptoms of very bad dehydration may include:   Changes in skin, such as:  Cold and clammy skin.  Blotchy (mottled) or pale skin.  Skin that does not quickly return to normal after being lightly pinched and let go (poor skin turgor).  Changes in body fluids, such as:  Feeling very thirsty.  Your eyes making fewer tears.  Not sweating when body temperature is high, such as in hot weather.  Your body making very little pee.  Changes in vital signs, such as:  Weak pulse.  Pulse that is more than 100 beats a minute when you are sitting still.  Fast breathing.  Low blood pressure.  Other changes, such as:  Sunken eyes.  Cold hands and feet.  Confusion.  Lack of energy (lethargy).  Trouble waking up from sleep.  Short-term weight loss.  Unconsciousness. Follow these instructions at home:  If told by your doctor, drink an ORS:  Make an ORS by using instructions on the package.  Start by drinking small amounts, about  cup (120 mL) every 5-10 minutes.  Slowly drink more until you have had the amount that your doctor said to have.  Drink enough clear fluid to keep your pee clear or pale yellow. If you were told to drink an ORS, finish the  ORS first, then start slowly drinking clear fluids. Drink fluids such as:  Water. Do not drink only water by itself. Doing that can make the salt (sodium) level in your body get too low (hyponatremia).  Ice chips.  Fruit juice that you have added water to (diluted).  Low-calorie sports drinks.  Avoid:  Alcohol.  Drinks that have a lot of sugar. These include high-calorie sports drinks, fruit juice that does not have water added, and soda.  Caffeine.  Foods that are greasy or have a lot of fat or sugar.  Take over-the-counter and prescription medicines only as told by your doctor.  Do not take salt tablets. Doing that can make the salt level in your body get too high (hypernatremia).  Eat foods that have minerals (electrolytes). Examples include bananas, oranges, potatoes, tomatoes, and spinach.  Keep all follow-up visits as told by your doctor. This is important. Contact a doctor if:  You have belly (abdominal) pain that:  Gets worse.  Stays in one area (localizes).  You have a rash.  You have a stiff neck.  You get angry or annoyed more easily than normal (irritability).  You are more sleepy than normal.  You have a harder time waking up than normal.  You feel:  Weak.  Dizzy.  Very thirsty.  You have peed (urinated) only a small amount of very dark pee during 6-8 hours. Get help right away if:  You   have symptoms of very bad dehydration.  You cannot drink fluids without throwing up (vomiting).  Your symptoms get worse with treatment.  You have a fever.  You have a very bad headache.  You are throwing up or having watery poop (diarrhea) and it:  Gets worse.  Does not go away.  You have blood or something green (bile) in your throw-up.  You have blood in your poop (stool). This may cause poop to look black and tarry.  You have not peed in 6-8 hours.  You pass out (faint).  Your heart rate when you are sitting still is more than 100 beats a  minute.  You have trouble breathing. This information is not intended to replace advice given to you by your health care provider. Make sure you discuss any questions you have with your health care provider. Document Released: 06/17/2009 Document Revised: 03/10/2016 Document Reviewed: 10/15/2015 Elsevier Interactive Patient Education  2017 [employer]  

## 2016-08-16 NOTE — Progress Notes (Signed)
Oncology Nurse Navigator Documentation  Met with Mr. [name] [name] in Infusion where he was receiving scheduled IVF to check on his well-being. He indicated he now has a car and does no longer needs to rely on public transportation for appts. He reported:  "Not eating much"; some return of sense of taste but lack of appetite.  Eating and drinking by mouth though minimal solid food intake.    Drinking 3-4 Ensures daily, "I know I need to drink more".  Flushing PEG daily but not using for nutrition or hydration. He understands he will see Dr. Alvy Bimler next week 12/20 but currently does not have an appt with Nutrition.   Dr. Alvy Bimler and Dory Peru, RD, informed.  Gayleen Orem, RN, BSN, [place] [facility] Oncology Nurse [place] at [place] [phone]

## 2016-08-16 NOTE — Progress Notes (Signed)
Cameo RN aware of patient's concern for continued anorexia and weight loss. She will convey this to MD Alvy Bimler when she returns tomorrow. Pt made aware Dr. Alvy Bimler intends to see him next week when he returns. He agreed to this plan.

## 2016-08-17 ENCOUNTER — Telehealth: Payer: Self-pay | Admitting: *Deleted

## 2016-08-17 ENCOUNTER — Ambulatory Visit (HOSPITAL_BASED_OUTPATIENT_CLINIC_OR_DEPARTMENT_OTHER): Payer: No Typology Code available for payment source

## 2016-08-17 VITALS — BP 111/84 | HR 62

## 2016-08-17 DIAGNOSIS — C090 Malignant neoplasm of tonsillar fossa: Secondary | ICD-10-CM

## 2016-08-17 MED ORDER — ONDANSETRON HCL 4 MG/2ML IJ SOLN
INTRAMUSCULAR | Status: AC
  Filled 2016-08-17: qty 4

## 2016-08-17 MED ORDER — SODIUM CHLORIDE 0.9 % IV SOLN: Freq: Once | INTRAVENOUS | Status: DC

## 2016-08-17 MED ORDER — HEPARIN SOD (PORK) LOCK FLUSH 100 UNIT/ML IV SOLN
250.0000 [IU] | Freq: Once | INTRAVENOUS | Status: DC | PRN
  Filled 2016-08-17: qty 5

## 2016-08-17 MED ORDER — ACETAMINOPHEN 325 MG PO TABS
ORAL_TABLET | ORAL | Status: AC
  Filled 2016-08-17: qty 2

## 2016-08-17 MED ORDER — ACETAMINOPHEN 325 MG PO TABS
650.0000 mg | ORAL_TABLET | Freq: Four times a day (QID) | ORAL | Status: DC | PRN
  Administered 2016-08-17: 650 mg via ORAL

## 2016-08-17 MED ORDER — ALTEPLASE 2 MG IJ SOLR
2.0000 mg | Freq: Once | INTRAMUSCULAR | Status: DC | PRN
  Filled 2016-08-17: qty 2

## 2016-08-17 MED ORDER — SODIUM CHLORIDE 0.9 % IV SOLN
Freq: Once | INTRAVENOUS | Status: AC
  Administered 2016-08-17: 13:00:00 via INTRAVENOUS

## 2016-08-17 MED ORDER — SODIUM CHLORIDE 0.9 % IJ SOLN
10.0000 mL | INTRAMUSCULAR | Status: DC | PRN
  Administered 2016-08-17: 10 mL
  Filled 2016-08-17: qty 10

## 2016-08-17 MED ORDER — ONDANSETRON HCL 4 MG/2ML IJ SOLN
8.0000 mg | Freq: Once | INTRAMUSCULAR | Status: AC
  Administered 2016-08-17: 8 mg via INTRAVENOUS

## 2016-08-17 MED ORDER — HEPARIN SOD (PORK) LOCK FLUSH 100 UNIT/ML IV SOLN
500.0000 [IU] | Freq: Once | INTRAVENOUS | Status: AC | PRN
  Administered 2016-08-17: 500 [IU]
  Filled 2016-08-17: qty 5

## 2016-08-17 NOTE — Patient Instructions (Signed)
Dehydration, Adult Dehydration is a condition in which there is not enough fluid or water in the body. This happens when you lose more fluids than you take in. Important organs, such as the kidneys, brain, and heart, cannot function without a proper amount of fluids. Any loss of fluids from the body can lead to dehydration. Dehydration can range from mild to severe. This condition should be treated right away to prevent it from becoming severe. What are the causes? This condition may be caused by:  Vomiting.  Diarrhea.  Excessive sweating, such as from heat exposure or exercise.  Not drinking enough fluid, especially:  When ill.  While doing activity that requires a lot of energy.  Excessive urination.  Fever.  Infection.  Certain medicines, such as medicines that cause the body to lose excess fluid (diuretics).  Inability to access safe drinking water.  Reduced physical ability to get adequate water and food. What increases the risk? This condition is more likely to develop in people:  Who have a poorly controlled long-term (chronic) illness, such as diabetes, heart disease, or kidney disease.  Who are age 65 or older.  Who are disabled.  Who live in a place with high altitude.  Who play endurance sports. What are the signs or symptoms? Symptoms of mild dehydration may include:   Thirst.  Dry lips.  Slightly dry mouth.  Dry, warm skin.  Dizziness. Symptoms of moderate dehydration may include:   Very dry mouth.  Muscle cramps.  Dark urine. Urine may be the color of tea.  Decreased urine production.  Decreased tear production.  Heartbeat that is irregular or faster than normal (palpitations).  Headache.  Light-headedness, especially when you stand up from a sitting position.  Fainting (syncope). Symptoms of severe dehydration may include:   Changes in skin, such as:  Cold and clammy skin.  Blotchy (mottled) or pale skin.  Skin that does  not quickly return to normal after being lightly pinched and released (poor skin turgor).  Changes in body fluids, such as:  Extreme thirst.  No tear production.  Inability to sweat when body temperature is high, such as in hot weather.  Very little urine production.  Changes in vital signs, such as:  Weak pulse.  Pulse that is more than 100 beats a minute when sitting still.  Rapid breathing.  Low blood pressure.  Other changes, such as:  Sunken eyes.  Cold hands and feet.  Confusion.  Lack of energy (lethargy).  Difficulty waking up from sleep.  Short-term weight loss.  Unconsciousness. How is this diagnosed? This condition is diagnosed based on your symptoms and a physical exam. Blood and urine tests may be done to help confirm the diagnosis. How is this treated? Treatment for this condition depends on the severity. Mild or moderate dehydration can often be treated at home. Treatment should be started right away. Do not wait until dehydration becomes severe. Severe dehydration is an emergency and it needs to be treated in a hospital. Treatment for mild dehydration may include:   Drinking more fluids.  Replacing salts and minerals in your blood (electrolytes) that you may have lost. Treatment for moderate dehydration may include:   Drinking an oral rehydration solution (ORS). This is a drink that helps you replace fluids and electrolytes (rehydrate). It can be found at pharmacies and retail stores. Treatment for severe dehydration may include:   Receiving fluids through an IV tube.  Receiving an electrolyte solution through a feeding tube that is   passed through your nose and into your stomach (nasogastric tube, or NG tube).  Correcting any abnormalities in electrolytes.  Treating the underlying cause of dehydration. Follow these instructions at home:  If directed by your health care provider, drink an ORS:  Make an ORS by following instructions on the  package.  Start by drinking small amounts, about  cup (120 mL) every 5-10 minutes.  Slowly increase how much you drink until you have taken the amount recommended by your health care provider.  Drink enough clear fluid to keep your urine clear or pale yellow. If you were told to drink an ORS, finish the ORS first, then start slowly drinking other clear fluids. Drink fluids such as:  Water. Do not drink only water. Doing that can lead to having too little salt (sodium) in the body (hyponatremia).  Ice chips.  Fruit juice that you have added water to (diluted fruit juice).  Low-calorie sports drinks.  Avoid:  Alcohol.  Drinks that contain a lot of sugar. These include high-calorie sports drinks, fruit juice that is not diluted, and soda.  Caffeine.  Foods that are greasy or contain a lot of fat or sugar.  Take over-the-counter and prescription medicines only as told by your health care provider.  Do not take sodium tablets. This can lead to having too much sodium in the body (hypernatremia).  Eat foods that contain a healthy balance of electrolytes, such as bananas, oranges, potatoes, tomatoes, and spinach.  Keep all follow-up visits as told by your health care provider. This is important. Contact a health care provider if:  You have abdominal pain that:  Gets worse.  Stays in one area (localizes).  You have a rash.  You have a stiff neck.  You are more irritable than usual.  You are sleepier or more difficult to wake up than usual.  You feel weak or dizzy.  You feel very thirsty.  You have urinated only a small amount of very dark urine over 6-8 hours. Get help right away if:  You have symptoms of severe dehydration.  You cannot drink fluids without vomiting.  Your symptoms get worse with treatment.  You have a fever.  You have a severe headache.  You have vomiting or diarrhea that:  Gets worse.  Does not go away.  You have blood or green matter  (bile) in your vomit.  You have blood in your stool. This may cause stool to look black and tarry.  You have not urinated in 6-8 hours.  You faint.  Your heart rate while sitting still is over 100 beats a minute.  You have trouble breathing. This information is not intended to replace advice given to you by your health care provider. Make sure you discuss any questions you have with your health care provider. Document Released: 08/21/2005 Document Revised: 03/17/2016 Document Reviewed: 10/15/2015 [employer]vier Interactive Patient Education  2017 [employer]  

## 2016-08-17 NOTE — Telephone Encounter (Signed)
-----   Message from Heath Lark, MD sent at 08/16/2016 11:45 AM EST ----- Regarding: labs He is still in renal failure Please recommend him to increase oral fluid intake Can you please try to add IVF tomorrow and Saturday as well? ----- Message ----- From: Interface, Lab In Three Zero One Sent: 08/16/2016  10:48 AM To: Heath Lark, MD

## 2016-08-17 NOTE — Telephone Encounter (Signed)
Informed pt of labs show dehydration/ kidney failure.  He needs IVFs today and also Saturday (in addition to tomorrow already scheduled).  Added pt on at 1:30 pm today.  He verbalized understanding.  Urgent message sent to add on for Saturday this week.

## 2016-08-18 ENCOUNTER — Inpatient Hospital Stay (HOSPITAL_COMMUNITY): Admission: RE | Admit: 2016-08-18 | Payer: Self-pay | Source: Ambulatory Visit

## 2016-08-18 ENCOUNTER — Ambulatory Visit: Payer: Self-pay

## 2016-08-18 ENCOUNTER — Ambulatory Visit (HOSPITAL_COMMUNITY)
Admission: RE | Admit: 2016-08-18 | Discharge: 2016-08-18 | Disposition: A | Discharge status: Home / Self Care | Payer: Medicaid Other | Source: Ambulatory Visit | Attending: Hematology and Oncology | Admitting: Hematology and Oncology

## 2016-08-18 ENCOUNTER — Encounter (HOSPITAL_COMMUNITY): Payer: Self-pay

## 2016-08-18 ENCOUNTER — Telehealth: Payer: Self-pay | Admitting: *Deleted

## 2016-08-18 ENCOUNTER — Ambulatory Visit (HOSPITAL_COMMUNITY): Payer: Medicaid Other | Attending: Cardiology

## 2016-08-18 DIAGNOSIS — I2510 Atherosclerotic heart disease of native coronary artery without angina pectoris: Secondary | ICD-10-CM | POA: Diagnosis present

## 2016-08-18 DIAGNOSIS — C090 Malignant neoplasm of tonsillar fossa: Secondary | ICD-10-CM | POA: Insufficient documentation

## 2016-08-18 DIAGNOSIS — R079 Chest pain, unspecified: Secondary | ICD-10-CM | POA: Diagnosis not present

## 2016-08-18 LAB — MYOCARDIAL PERFUSION IMAGING
CHL CUP NUCLEAR SRS: 0
CHL CUP NUCLEAR SSS: 7
LHR: 0.25
LV dias vol: 96 mL (ref 62–150)
LV sys vol: 47 mL
NUC STRESS TID: 0.99
Peak HR: 109 {beats}/min
Rest HR: 62 {beats}/min
SDS: 7

## 2016-08-18 MED ORDER — HEPARIN SOD (PORK) LOCK FLUSH 100 UNIT/ML IV SOLN
500.0000 [IU] | INTRAVENOUS | Status: AC | PRN
Start: 2016-08-18 — End: 2016-08-18
  Administered 2016-08-18: 500 [IU]
  Filled 2016-08-18: qty 5

## 2016-08-18 MED ORDER — TECHNETIUM TC 99M TETROFOSMIN IV KIT
31.8000 | PACK | Freq: Once | INTRAVENOUS | Status: AC | PRN
  Administered 2016-08-18: 31.8 via INTRAVENOUS
  Filled 2016-08-18: qty 32

## 2016-08-18 MED ORDER — SODIUM CHLORIDE 0.9% FLUSH
10.0000 mL | INTRAVENOUS | Status: AC | PRN
  Administered 2016-08-18: 10 mL

## 2016-08-18 MED ORDER — TECHNETIUM TC 99M TETROFOSMIN IV KIT
10.8000 | PACK | Freq: Once | INTRAVENOUS | Status: AC | PRN
  Administered 2016-08-18: 10.8 via INTRAVENOUS
  Filled 2016-08-18: qty 11

## 2016-08-18 MED ORDER — REGADENOSON 0.4 MG/5ML IV SOLN
0.4000 mg | Freq: Once | INTRAVENOUS | Status: AC
  Administered 2016-08-18: 0.4 mg via INTRAVENOUS

## 2016-08-18 MED ORDER — SODIUM CHLORIDE 0.9 % IV SOLN
Freq: Once | INTRAVENOUS | Status: AC
  Administered 2016-08-18: 13:00:00 via INTRAVENOUS

## 2016-08-18 NOTE — Telephone Encounter (Signed)
Pt unable to make his IVF this morning due to tests scheduled at [facility].   He can come around 11 am but there is no room in Infusion at that time.  Scheduled pt at [facility] at noon (that is the soonest they could do)..  LVM for pt informing of appt at [facility] at noon.  Please call nurse back if he cannot make it.  Otherwise he could come in at 3:15 pm to our infusion room.

## 2016-08-18 NOTE — Progress Notes (Signed)
Patient ID: [name] [name], male   DOB: 01/09/1956, 60 y.o.   MRN: [id number]  Procedure: Infusion of 1000 ml 0.9% Normal Saline over 2 hours via port as ordered.  Provider: Heath Lark MD  Patient tolerated infusion well. Went over instructions and copy given to patient. Port flushed per protocol. Alert, oriented and ambulatory at time of discharge. Discharged to home.

## 2016-08-18 NOTE — Telephone Encounter (Signed)
Oncology Nurse Navigator Documentation  Called [name] [name].  He confirmed his arrival to [facility] for rescheduled IVF.  I spoke with Sharyn Lull, Infusion, cancelled 3:15 appt for this afternoon.  Gayleen Orem, RN, BSN, [place] Oncology Nurse [place] at [place] [phone]

## 2016-08-18 NOTE — Telephone Encounter (Signed)
Oncology Nurse Navigator Documentation  Received call from [facility] indicating time needed for myocardial infusion will prevent Mr. Deringer from keeping 9:15 IVF appt, noted he should be available by 10:30/11:00.  I asked tech to inform Mr. Polishchuk that I will arrange rescheduling of appt, that he should call me following procedure for appt update.  Notified Infusion of cancellation, informed by scheduler Sharyn Lull next available appt is 3:15.  I accepted appt pending earlier appt can be arranged with [facility] or [facility].  LVM for RN Cameo asking her to check these options.  Gayleen Orem, RN, BSN, [place] Oncology Nurse [place] at [place] [phone]

## 2016-08-19 ENCOUNTER — Ambulatory Visit: Payer: Self-pay

## 2016-08-21 ENCOUNTER — Other Ambulatory Visit (HOSPITAL_BASED_OUTPATIENT_CLINIC_OR_DEPARTMENT_OTHER): Payer: No Typology Code available for payment source

## 2016-08-21 ENCOUNTER — Ambulatory Visit (HOSPITAL_BASED_OUTPATIENT_CLINIC_OR_DEPARTMENT_OTHER): Payer: No Typology Code available for payment source

## 2016-08-21 ENCOUNTER — Ambulatory Visit: Payer: No Typology Code available for payment source | Admitting: Nutrition

## 2016-08-21 VITALS — BP 109/77 | HR 64 | Temp 98.6°F | Resp 18

## 2016-08-21 DIAGNOSIS — C090 Malignant neoplasm of tonsillar fossa: Secondary | ICD-10-CM

## 2016-08-21 DIAGNOSIS — E860 Dehydration: Secondary | ICD-10-CM

## 2016-08-21 LAB — CBC WITH DIFFERENTIAL/PLATELET
BASO%: 0.1 % (ref 0.0–2.0)
Basophils Absolute: 0 10*3/uL (ref 0.0–0.1)
EOS%: 1.3 % (ref 0.0–7.0)
Eosinophils Absolute: 0.1 10*3/uL (ref 0.0–0.5)
HEMATOCRIT: 30.6 % — AB (ref 38.4–49.9)
HEMOGLOBIN: 10.6 g/dL — AB (ref 13.0–17.1)
LYMPH#: 0.8 10*3/uL — AB (ref 0.9–3.3)
LYMPH%: 11.2 % — ABNORMAL LOW (ref 14.0–49.0)
MCH: 32.8 pg (ref 27.2–33.4)
MCHC: 34.6 g/dL (ref 32.0–36.0)
MCV: 94.7 fL (ref 79.3–98.0)
MONO#: 0.7 10*3/uL (ref 0.1–0.9)
MONO%: 9.8 % (ref 0.0–14.0)
NEUT#: 5.5 10*3/uL (ref 1.5–6.5)
NEUT%: 77.6 % — AB (ref 39.0–75.0)
Platelets: 211 10*3/uL (ref 140–400)
RBC: 3.23 10*6/uL — ABNORMAL LOW (ref 4.20–5.82)
RDW: 17.8 % — AB (ref 11.0–14.6)
WBC: 7.1 10*3/uL (ref 4.0–10.3)

## 2016-08-21 LAB — COMPREHENSIVE METABOLIC PANEL
ALBUMIN: 3.5 g/dL (ref 3.5–5.0)
ALK PHOS: 70 U/L (ref 40–150)
ALT: 14 U/L (ref 0–55)
ANION GAP: 9 meq/L (ref 3–11)
AST: 21 U/L (ref 5–34)
BILIRUBIN TOTAL: 0.32 mg/dL (ref 0.20–1.20)
BUN: 21.3 mg/dL (ref 7.0–26.0)
CO2: 26 mEq/L (ref 22–29)
Calcium: 9.7 mg/dL (ref 8.4–10.4)
Chloride: 105 mEq/L (ref 98–109)
Creatinine: 1.5 mg/dL — ABNORMAL HIGH (ref 0.7–1.3)
EGFR: 60 mL/min/{1.73_m2} — AB (ref >90)
Glucose: 174 mg/dl — ABNORMAL HIGH (ref 70–140)
Potassium: 4 mEq/L (ref 3.5–5.1)
Sodium: 140 mEq/L (ref 136–145)
TOTAL PROTEIN: 7.3 g/dL (ref 6.4–8.3)

## 2016-08-21 MED ORDER — ACETAMINOPHEN 325 MG PO TABS
ORAL_TABLET | ORAL | Status: AC
  Filled 2016-08-21: qty 2

## 2016-08-21 MED ORDER — SODIUM CHLORIDE 0.9 % IJ SOLN
10.0000 mL | INTRAMUSCULAR | Status: DC | PRN
  Administered 2016-08-21: 10 mL
  Filled 2016-08-21: qty 10

## 2016-08-21 MED ORDER — ACETAMINOPHEN 325 MG PO TABS
650.0000 mg | ORAL_TABLET | Freq: Four times a day (QID) | ORAL | Status: DC | PRN
  Administered 2016-08-21: 650 mg via ORAL

## 2016-08-21 MED ORDER — SODIUM CHLORIDE 0.9 % IV SOLN
Freq: Once | INTRAVENOUS | Status: AC
  Administered 2016-08-21: 09:00:00 via INTRAVENOUS

## 2016-08-21 MED ORDER — HEPARIN SOD (PORK) LOCK FLUSH 100 UNIT/ML IV SOLN
500.0000 [IU] | Freq: Once | INTRAVENOUS | Status: AC | PRN
  Administered 2016-08-21: 500 [IU]
  Filled 2016-08-21: qty 5

## 2016-08-21 NOTE — Progress Notes (Signed)
Nutrition follow-up completed with patient who receives treatment for tonsil cancer. Weight has decreased and was documented as 138.5 pounds, December 13, down from 145.8 pounds November 6. Patient reports his appetite has increased and he is eating more. Reports he is not using his feeding tube at this time. He continues to flush his tube with water daily. Noted labs glucose 174 and creatinine 1.5.  Nutrition diagnosis: Inadequate oral intake continues.  Intervention: I educated patient to drink Ensure Plus 3 times daily or use Osmolite 1.5 - 3 times a day. Have encouraged patient to continue oral intake as tolerated consuming high-calorie high-protein foods. Questions were answered.  Teach back method used.  Monitoring, evaluation, goals:  Patient will tolerate increased oral intake to minimize weight loss.  Next visit: Friday, December 22.  **Disclaimer: This note was dictated with voice recognition software. Similar sounding words can inadvertently be transcribed and this note may contain transcription errors which may not have been corrected upon publication of note.**

## 2016-08-21 NOTE — Patient Instructions (Signed)
Dehydration, Adult Dehydration is a condition in which there is not enough fluid or water in the body. This happens when you lose more fluids than you take in. Important organs, such as the kidneys, brain, and heart, cannot function without a proper amount of fluids. Any loss of fluids from the body can lead to dehydration. Dehydration can range from mild to severe. This condition should be treated right away to prevent it from becoming severe. What are the causes? This condition may be caused by:  Vomiting.  Diarrhea.  Excessive sweating, such as from heat exposure or exercise.  Not drinking enough fluid, especially:  When ill.  While doing activity that requires a lot of energy.  Excessive urination.  Fever.  Infection.  Certain medicines, such as medicines that cause the body to lose excess fluid (diuretics).  Inability to access safe drinking water.  Reduced physical ability to get adequate water and food. What increases the risk? This condition is more likely to develop in people:  Who have a poorly controlled long-term (chronic) illness, such as diabetes, heart disease, or kidney disease.  Who are age 65 or older.  Who are disabled.  Who live in a place with high altitude.  Who play endurance sports. What are the signs or symptoms? Symptoms of mild dehydration may include:   Thirst.  Dry lips.  Slightly dry mouth.  Dry, warm skin.  Dizziness. Symptoms of moderate dehydration may include:   Very dry mouth.  Muscle cramps.  Dark urine. Urine may be the color of tea.  Decreased urine production.  Decreased tear production.  Heartbeat that is irregular or faster than normal (palpitations).  Headache.  Light-headedness, especially when you stand up from a sitting position.  Fainting (syncope). Symptoms of severe dehydration may include:   Changes in skin, such as:  Cold and clammy skin.  Blotchy (mottled) or pale skin.  Skin that does  not quickly return to normal after being lightly pinched and released (poor skin turgor).  Changes in body fluids, such as:  Extreme thirst.  No tear production.  Inability to sweat when body temperature is high, such as in hot weather.  Very little urine production.  Changes in vital signs, such as:  Weak pulse.  Pulse that is more than 100 beats a minute when sitting still.  Rapid breathing.  Low blood pressure.  Other changes, such as:  Sunken eyes.  Cold hands and feet.  Confusion.  Lack of energy (lethargy).  Difficulty waking up from sleep.  Short-term weight loss.  Unconsciousness. How is this diagnosed? This condition is diagnosed based on your symptoms and a physical exam. Blood and urine tests may be done to help confirm the diagnosis. How is this treated? Treatment for this condition depends on the severity. Mild or moderate dehydration can often be treated at home. Treatment should be started right away. Do not wait until dehydration becomes severe. Severe dehydration is an emergency and it needs to be treated in a hospital. Treatment for mild dehydration may include:   Drinking more fluids.  Replacing salts and minerals in your blood (electrolytes) that you may have lost. Treatment for moderate dehydration may include:   Drinking an oral rehydration solution (ORS). This is a drink that helps you replace fluids and electrolytes (rehydrate). It can be found at pharmacies and retail stores. Treatment for severe dehydration may include:   Receiving fluids through an IV tube.  Receiving an electrolyte solution through a feeding tube that is   passed through your nose and into your stomach (nasogastric tube, or NG tube).  Correcting any abnormalities in electrolytes.  Treating the underlying cause of dehydration. Follow these instructions at home:  If directed by your health care provider, drink an ORS:  Make an ORS by following instructions on the  package.  Start by drinking small amounts, about  cup (120 mL) every 5-10 minutes.  Slowly increase how much you drink until you have taken the amount recommended by your health care provider.  Drink enough clear fluid to keep your urine clear or pale yellow. If you were told to drink an ORS, finish the ORS first, then start slowly drinking other clear fluids. Drink fluids such as:  Water. Do not drink only water. Doing that can lead to having too little salt (sodium) in the body (hyponatremia).  Ice chips.  Fruit juice that you have added water to (diluted fruit juice).  Low-calorie sports drinks.  Avoid:  Alcohol.  Drinks that contain a lot of sugar. These include high-calorie sports drinks, fruit juice that is not diluted, and soda.  Caffeine.  Foods that are greasy or contain a lot of fat or sugar.  Take over-the-counter and prescription medicines only as told by your health care provider.  Do not take sodium tablets. This can lead to having too much sodium in the body (hypernatremia).  Eat foods that contain a healthy balance of electrolytes, such as bananas, oranges, potatoes, tomatoes, and spinach.  Keep all follow-up visits as told by your health care provider. This is important. Contact a health care provider if:  You have abdominal pain that:  Gets worse.  Stays in one area (localizes).  You have a rash.  You have a stiff neck.  You are more irritable than usual.  You are sleepier or more difficult to wake up than usual.  You feel weak or dizzy.  You feel very thirsty.  You have urinated only a small amount of very dark urine over 6-8 hours. Get help right away if:  You have symptoms of severe dehydration.  You cannot drink fluids without vomiting.  Your symptoms get worse with treatment.  You have a fever.  You have a severe headache.  You have vomiting or diarrhea that:  Gets worse.  Does not go away.  You have blood or green matter  (bile) in your vomit.  You have blood in your stool. This may cause stool to look black and tarry.  You have not urinated in 6-8 hours.  You faint.  Your heart rate while sitting still is over 100 beats a minute.  You have trouble breathing. This information is not intended to replace advice given to you by your health care provider. Make sure you discuss any questions you have with your health care provider. Document Released: 08/21/2005 Document Revised: 03/17/2016 Document Reviewed: 10/15/2015 [employer]vier Interactive Patient Education  2017 [employer]  

## 2016-08-21 NOTE — Progress Notes (Signed)
Patient complains of upper throat pain that remains constant.  Patient rates the throat pain a 6 on the 0 to 10 pain scale. Patient states he is able to eat and drink. Patient given 650 mg Tylenol PO for throat pain.

## 2016-08-23 ENCOUNTER — Ambulatory Visit (HOSPITAL_BASED_OUTPATIENT_CLINIC_OR_DEPARTMENT_OTHER): Payer: No Typology Code available for payment source

## 2016-08-23 ENCOUNTER — Ambulatory Visit (HOSPITAL_BASED_OUTPATIENT_CLINIC_OR_DEPARTMENT_OTHER): Payer: No Typology Code available for payment source | Admitting: Hematology and Oncology

## 2016-08-23 VITALS — BP 139/87 | HR 60 | Temp 98.8°F | Resp 16

## 2016-08-23 DIAGNOSIS — N179 Acute kidney failure, unspecified: Secondary | ICD-10-CM

## 2016-08-23 DIAGNOSIS — C090 Malignant neoplasm of tonsillar fossa: Secondary | ICD-10-CM

## 2016-08-23 DIAGNOSIS — D638 Anemia in other chronic diseases classified elsewhere: Secondary | ICD-10-CM

## 2016-08-23 MED ORDER — ACETAMINOPHEN 325 MG PO TABS
ORAL_TABLET | ORAL | Status: AC
Start: 2016-08-23 — End: 2016-08-23
  Filled 2016-08-23: qty 2

## 2016-08-23 MED ORDER — SODIUM CHLORIDE 0.9 % IJ SOLN
10.0000 mL | INTRAMUSCULAR | Status: DC | PRN
  Administered 2016-08-23: 10 mL
  Filled 2016-08-23: qty 10

## 2016-08-23 MED ORDER — ACETAMINOPHEN 325 MG PO TABS
650.0000 mg | ORAL_TABLET | Freq: Four times a day (QID) | ORAL | Status: DC | PRN
  Administered 2016-08-23: 650 mg via ORAL

## 2016-08-23 MED ORDER — HEPARIN SOD (PORK) LOCK FLUSH 100 UNIT/ML IV SOLN
500.0000 [IU] | Freq: Once | INTRAVENOUS | Status: AC | PRN
  Administered 2016-08-23: 500 [IU]
  Filled 2016-08-23: qty 5

## 2016-08-23 MED ORDER — SODIUM CHLORIDE 0.9 % IV SOLN
Freq: Once | INTRAVENOUS | Status: AC
  Administered 2016-08-23: 09:00:00 via INTRAVENOUS

## 2016-08-23 MED ORDER — ONDANSETRON HCL 4 MG/2ML IJ SOLN
8.0000 mg | Freq: Once | INTRAMUSCULAR | Status: AC
  Administered 2016-08-23: 8 mg via INTRAVENOUS

## 2016-08-23 MED ORDER — ONDANSETRON HCL 4 MG/2ML IJ SOLN
INTRAMUSCULAR | Status: AC
  Filled 2016-08-23: qty 4

## 2016-08-23 MED ORDER — SODIUM CHLORIDE 0.9 % IV SOLN: Freq: Once | INTRAVENOUS | Status: DC

## 2016-08-23 NOTE — Patient Instructions (Signed)

## 2016-08-24 ENCOUNTER — Encounter: Payer: Self-pay | Admitting: Hematology and Oncology

## 2016-08-24 DIAGNOSIS — D638 Anemia in other chronic diseases classified elsewhere: Secondary | ICD-10-CM | POA: Insufficient documentation

## 2016-08-24 NOTE — Assessment & Plan Note (Signed)
This is improving with IV fluid support. I encouraged him to increase oral intake as tolerated

## 2016-08-24 NOTE — Progress Notes (Signed)
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 Chronic small-vessel ischemic changes of the cerebral hemispheric white matter.       04/04/2016 Imaging    CT neck showed left tonsillar mass with diameter of 19 x 24 mm consistent with 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 necrosis. Suspicious level 5/supraclavicular nodes on the left. Scar type density in the right upper lobe is more prominent than was seen on a CT scan of the chest 10/06/2014      04/21/2016 PET scan    Asymmetric hypermetabolic soft tissue prominence in left palatine tonsil, consistent with known primary left tonsillar squamous cell Carcinoma. Left level 2 cervical lymphadenopathy, consistent with metastatic disease. No evidence of metastatic disease within the chest, abdomen, or pelvis. Stable 14 mm ground-glass and part solid nodular opacity in right lung apex compared to previous CT in 2016. This shows low-grade metabolic activity. Low-grade bronchogenic adenocarcinoma cannot be excluded. Consider surgical resection versus continued followup by CT in 12 months      05/17/2016 Procedure    Successful placement of a 20 French pull  through gastrostomy tube & right IJ approach [facility] [place].      05/22/2016 - 07/03/2016 Chemotherapy    He received 3 doses of high dose cisplatin with radiation       05/23/2016 - 07/10/2016 Radiation Therapy    Rec'd Helical IMRT:  Left tonsil and bilateral neck / 70 Gy in 35 fractions to gross disease, 63 Gy in 35 fractions to high risk nodal echelons, and 56 Gy in 35 fractions to intermediate risk nodal echelons.           INTERVAL HISTORY: Please see below for problem oriented charting. He returns for follow-up. He felt that he is hydrating himself adequately. He denies pain. He denies swallowing difficulties. He has started taking some oral diet without problem. He denies nausea or constipation.  REVIEW OF SYSTEMS:   Constitutional: Denies fevers, chills or abnormal weight loss Eyes: Denies blurriness of vision Ears, nose, mouth, throat, and face: Denies mucositis or sore throat Respiratory: Denies cough, dyspnea or wheezes Cardiovascular: Denies palpitation, chest discomfort or lower extremity swelling Gastrointestinal:  Denies nausea, heartburn or change in bowel habits Skin: Denies abnormal skin rashes Lymphatics: Denies new lymphadenopathy or easy bruising Neurological:Denies numbness, tingling or new weaknesses Behavioral/Psych: Mood is stable, no new changes  All other systems were reviewed with the patient and are negative.  I have reviewed the past medical history, past surgical history, social history and family history with the patient and they are unchanged from previous note.  ALLERGIES:  has No Known Allergies.  MEDICATIONS:  Current Outpatient Prescriptions  Medication Sig Dispense Refill  . Ethyl Alcohol, Skin Cleanser, (PURELL  INSTANT HAND SANITIZER) 65 % FOAM Use as needed to sanitize hands 160 mL 5  . lidocaine-prilocaine (EMLA) cream Apply to affected area once 30 g 3  . Morphine Sulfate (MORPHINE CONCENTRATE) 10 mg / 0.5 ml concentrated solution  Take 0.5 mLs (10 mg total) by mouth every 2 (two) hours as needed for severe pain. 240 mL 0  . Nutritional Supplements (FEEDING SUPPLEMENT, OSMOLITE 1.5 CAL,) LIQD Begin Osmolite 1.5 via tube at 60 ml/hr from 4pm -6am as tolerated. Increase 10 cc every 4 hours as tolerated to goal of 120 cc/hr. Flush feeding tube with 120 cc free water before and after continuous feeding. Give 240 cc free water 4 times daily at  8 am,10 am, 12 noon and 2 pm. 1659 mL 0  . ondansetron (ZOFRAN) 8 MG tablet Take 1 tablet (8 mg total) by mouth every 8 (eight) hours as needed. Start on the third day after chemotherapy. 60 tablet 1  . polyethylene glycol (MIRALAX) packet Take 17 g by mouth daily. 14 each 9  . prochlorperazine (COMPAZINE) 10 MG tablet Take 1 tablet (10 mg total) by mouth every 6 (six) hours as needed (Nausea or vomiting). 90 tablet 3  . sodium fluoride (FLUORISHIELD) 1.1 % GEL dental gel Instill one drop of gel per tooth space of fluoride tray. Place over teeth for 5 minutes. Remove. Spit out excess. Repeat nightly. 120 mL prn   Current Facility-Administered Medications  Medication Dose Route Frequency Provider Last Rate Last Dose  . dexamethasone (DECADRON) injection 10 mg  10 mg Intravenous Once Heath Lark, MD      . LORazepam (ATIVAN) injection 0.5 mg  0.5 mg Intravenous Once Heath Lark, MD       Facility-Administered Medications Ordered in Other Visits  Medication Dose Route Frequency Provider Last Rate Last Dose  . sodium chloride flush (NS) 0.9 % injection 10 mL  10 mL Intracatheter PRN Heath Lark, MD   10 mL at 05/24/16 1615    PHYSICAL EXAMINATION: ECOG PERFORMANCE STATUS: 1 - Symptomatic but completely ambulatory GENERAL:alert, no distress and comfortable. He looks thin and cachectic SKIN: skin color, texture, turgor are normal, no rashes or significant lesions EYES: normal, Conjunctiva are pink and non-injected, sclera clear OROPHARYNX:no exudate, no erythema and lips, buccal mucosa, and  tongue normal  NECK: supple, thyroid normal size, non-tender, without nodularity LYMPH:  no palpable lymphadenopathy in the cervical, axillary or inguinal LUNGS: clear to auscultation and percussion with normal breathing effort HEART: regular rate & rhythm and no murmurs and no lower extremity edema ABDOMEN:abdomen soft, non-tender and normal bowel sounds Musculoskeletal:no cyanosis of digits and no clubbing  NEURO: alert & oriented x 3 with fluent speech, no focal motor/sensory deficits  LABORATORY DATA:  I have reviewed the data as listed    Component Value Date/Time   NA 140 08/21/2016 0821   K 4.0 08/21/2016 0821   CL 105 05/17/2016 1250   CO2 26 08/21/2016 0821   GLUCOSE 174 (H) 08/21/2016 0821   BUN 21.3 08/21/2016 0821   CREATININE 1.5 (H) 08/21/2016 0821   CALCIUM 9.7 08/21/2016 0821   PROT 7.3 08/21/2016 0821   ALBUMIN 3.5 08/21/2016 0821   AST 21 08/21/2016 0821   ALT 14 08/21/2016 0821   ALKPHOS 70 08/21/2016 0821   BILITOT 0.32 08/21/2016 0821   GFRNONAA >60 05/17/2016 1250   GFRAA >60 05/17/2016 1250    No results found for: SPEP, UPEP  Lab Results  Component Value Date   WBC  7.1 08/21/2016   NEUTROABS 5.5 08/21/2016   HGB 10.6 (L) 08/21/2016   HCT 30.6 (L) 08/21/2016   MCV 94.7 08/21/2016   PLT 211 08/21/2016      Chemistry      Component Value Date/Time   NA 140 08/21/2016 0821   K 4.0 08/21/2016 0821   CL 105 05/17/2016 1250   CO2 26 08/21/2016 0821   BUN 21.3 08/21/2016 0821   CREATININE 1.5 (H) 08/21/2016 0821      Component Value Date/Time   CALCIUM 9.7 08/21/2016 0821   ALKPHOS 70 08/21/2016 0821   AST 21 08/21/2016 0821   ALT 14 08/21/2016 0821   BILITOT 0.32 08/21/2016 0821      ASSESSMENT & PLAN:  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[facility]) He is recovering well from treatment and has started to eat oral diet. He will complete IV fluid support tomorrow. I plan to see him back again in a few weeks for further supportive  care  Acute prerenal failure ([place]) This is improving with IV fluid support. I encouraged him to increase oral intake as tolerated  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 The patient denies recent history of bleeding such as epistaxis, hematuria or hematochezia. He is asymptomatic from the anemia. We will observe for now.  He does not require transfusion now.     No orders of the defined types were placed in this encounter.  All questions were answered. The patient knows to call the clinic with any problems, questions or concerns. No barriers to learning was detected. I spent 15 minutes counseling the patient face to face. The total time spent in the appointment was 20 minutes and more than 50% was on counseling and review of test results     Heath Lark, MD 08/24/2016 3:26 PM

## 2016-08-24 NOTE — Assessment & Plan Note (Signed)
This is likely anemia of chronic disease. The patient denies recent history of bleeding such as epistaxis, hematuria or hematochezia. He is asymptomatic from the anemia. We will observe for now.  He does not require transfusion now.   

## 2016-08-24 NOTE — Assessment & Plan Note (Signed)
He is recovering well from treatment and has started to eat oral diet. He will complete IV fluid support tomorrow. I plan to see him back again in a few weeks for further supportive care

## 2016-08-25 ENCOUNTER — Ambulatory Visit (HOSPITAL_BASED_OUTPATIENT_CLINIC_OR_DEPARTMENT_OTHER): Payer: No Typology Code available for payment source

## 2016-08-25 ENCOUNTER — Ambulatory Visit: Payer: No Typology Code available for payment source | Admitting: Nutrition

## 2016-08-25 ENCOUNTER — Telehealth: Payer: Self-pay | Admitting: Hematology and Oncology

## 2016-08-25 VITALS — BP 119/78 | HR 76 | Temp 97.7°F | Resp 20

## 2016-08-25 DIAGNOSIS — E860 Dehydration: Secondary | ICD-10-CM

## 2016-08-25 DIAGNOSIS — C090 Malignant neoplasm of tonsillar fossa: Secondary | ICD-10-CM

## 2016-08-25 MED ORDER — HEPARIN SOD (PORK) LOCK FLUSH 100 UNIT/ML IV SOLN
500.0000 [IU] | Freq: Once | INTRAVENOUS | Status: AC | PRN
  Administered 2016-08-25: 500 [IU]
  Filled 2016-08-25: qty 5

## 2016-08-25 MED ORDER — ACETAMINOPHEN 325 MG PO TABS
650.0000 mg | ORAL_TABLET | Freq: Once | ORAL | Status: AC
  Administered 2016-08-25: 650 mg via ORAL

## 2016-08-25 MED ORDER — ONDANSETRON HCL 4 MG/2ML IJ SOLN
INTRAMUSCULAR | Status: AC
  Filled 2016-08-25: qty 4

## 2016-08-25 MED ORDER — SODIUM CHLORIDE 0.9 % IV SOLN
1000.0000 mL | Freq: Once | INTRAVENOUS | Status: AC
  Administered 2016-08-25: 1000 mL via INTRAVENOUS

## 2016-08-25 MED ORDER — SODIUM CHLORIDE 0.9 % IJ SOLN
10.0000 mL | INTRAMUSCULAR | Status: DC | PRN
  Administered 2016-08-25: 10 mL
  Filled 2016-08-25: qty 10

## 2016-08-25 MED ORDER — ONDANSETRON HCL 4 MG/2ML IJ SOLN
8.0000 mg | Freq: Once | INTRAMUSCULAR | Status: AC
  Administered 2016-08-25: 8 mg via INTRAVENOUS

## 2016-08-25 MED ORDER — SODIUM CHLORIDE 0.9 % IV SOLN: Freq: Once | INTRAVENOUS | Status: DC

## 2016-08-25 MED ORDER — ACETAMINOPHEN 325 MG PO TABS
ORAL_TABLET | ORAL | Status: AC
  Filled 2016-08-25: qty 2

## 2016-08-25 NOTE — Patient Instructions (Signed)
Dehydration, Adult Dehydration is when there is not enough fluid or water in your body. This happens when you lose more fluids than you take in. Dehydration can range from mild to very bad. It should be treated right away to keep it from getting very bad. Symptoms of mild dehydration may include:   Thirst.  Dry lips.  Slightly dry mouth.  Dry, warm skin.  Dizziness. Symptoms of moderate dehydration may include:   Very dry mouth.  Muscle cramps.  Dark pee (urine). Pee may be the color of tea.  Your body making less pee.  Your eyes making fewer tears.  Heartbeat that is uneven or faster than normal (palpitations).  Headache.  Light-headedness, especially when you stand up from sitting.  Fainting (syncope). Symptoms of very bad dehydration may include:   Changes in skin, such as:  Cold and clammy skin.  Blotchy (mottled) or pale skin.  Skin that does not quickly return to normal after being lightly pinched and let go (poor skin turgor).  Changes in body fluids, such as:  Feeling very thirsty.  Your eyes making fewer tears.  Not sweating when body temperature is high, such as in hot weather.  Your body making very little pee.  Changes in vital signs, such as:  Weak pulse.  Pulse that is more than 100 beats a minute when you are sitting still.  Fast breathing.  Low blood pressure.  Other changes, such as:  Sunken eyes.  Cold hands and feet.  Confusion.  Lack of energy (lethargy).  Trouble waking up from sleep.  Short-term weight loss.  Unconsciousness. Follow these instructions at home:  If told by your doctor, drink an ORS:  Make an ORS by using instructions on the package.  Start by drinking small amounts, about  cup (120 mL) every 5-10 minutes.  Slowly drink more until you have had the amount that your doctor said to have.  Drink enough clear fluid to keep your pee clear or pale yellow. If you were told to drink an ORS, finish the  ORS first, then start slowly drinking clear fluids. Drink fluids such as:  Water. Do not drink only water by itself. Doing that can make the salt (sodium) level in your body get too low (hyponatremia).  Ice chips.  Fruit juice that you have added water to (diluted).  Low-calorie sports drinks.  Avoid:  Alcohol.  Drinks that have a lot of sugar. These include high-calorie sports drinks, fruit juice that does not have water added, and soda.  Caffeine.  Foods that are greasy or have a lot of fat or sugar.  Take over-the-counter and prescription medicines only as told by your doctor.  Do not take salt tablets. Doing that can make the salt level in your body get too high (hypernatremia).  Eat foods that have minerals (electrolytes). Examples include bananas, oranges, potatoes, tomatoes, and spinach.  Keep all follow-up visits as told by your doctor. This is important. Contact a doctor if:  You have belly (abdominal) pain that:  Gets worse.  Stays in one area (localizes).  You have a rash.  You have a stiff neck.  You get angry or annoyed more easily than normal (irritability).  You are more sleepy than normal.  You have a harder time waking up than normal.  You feel:  Weak.  Dizzy.  Very thirsty.  You have peed (urinated) only a small amount of very dark pee during 6-8 hours. Get help right away if:  You   have symptoms of very bad dehydration.  You cannot drink fluids without throwing up (vomiting).  Your symptoms get worse with treatment.  You have a fever.  You have a very bad headache.  You are throwing up or having watery poop (diarrhea) and it:  Gets worse.  Does not go away.  You have blood or something green (bile) in your throw-up.  You have blood in your poop (stool). This may cause poop to look black and tarry.  You have not peed in 6-8 hours.  You pass out (faint).  Your heart rate when you are sitting still is more than 100 beats a  minute.  You have trouble breathing. This information is not intended to replace advice given to you by your health care provider. Make sure you discuss any questions you have with your health care provider. Document Released: 06/17/2009 Document Revised: 03/10/2016 Document Reviewed: 10/15/2015 Elsevier Interactive Patient Education  2017 [employer]  

## 2016-08-25 NOTE — Progress Notes (Signed)
Nutrition follow up completed with patient in infusion. He reports he is eating better and thinks his appetite has improved. States he is drinking 3 Ensure Plus daily. Does not use feeding tube. No recent weight. 138.5 pounds on 12/13.  Nutrition diagnosis: Inadequate oral intake improved.  Intervention: Continue Ensure plus TID. Provided third complimentary case of Ensure Plus. Encourage increased calories and protein.  Monitoring, Evaluation, Goals: Increase oral intake to promote weight gain.  Next visit: to be scheduled as needed.  **Disclaimer: This note was dictated with voice recognition software. Similar sounding words can inadvertently be transcribed and this note may contain transcription errors which may not have been corrected upon publication of note.**

## 2016-08-25 NOTE — Progress Notes (Signed)
Patient ambulated well at discharge. No signs or symptoms of dizziness or distress noted.  Vital signs stable.

## 2016-08-25 NOTE — Telephone Encounter (Signed)
Patient came to pick up schedule and avs report from scheduling.

## 2016-08-25 NOTE — Progress Notes (Signed)
Patient ambulated well at discharge with no signs or symptoms of di

## 2016-09-01 ENCOUNTER — Telehealth: Payer: Self-pay | Admitting: *Deleted

## 2016-09-01 ENCOUNTER — Ambulatory Visit: Payer: No Typology Code available for payment source | Admitting: Nutrition

## 2016-09-01 ENCOUNTER — Other Ambulatory Visit: Payer: Self-pay | Admitting: *Deleted

## 2016-09-01 ENCOUNTER — Telehealth: Payer: Self-pay | Admitting: Hematology and Oncology

## 2016-09-01 ENCOUNTER — Ambulatory Visit (HOSPITAL_COMMUNITY)
Admission: RE | Admit: 2016-09-01 | Discharge: 2016-09-01 | Disposition: A | Discharge status: Home / Self Care | Payer: Medicaid Other | Source: Ambulatory Visit | Attending: Nurse Practitioner | Admitting: Nurse Practitioner

## 2016-09-01 ENCOUNTER — Ambulatory Visit (HOSPITAL_BASED_OUTPATIENT_CLINIC_OR_DEPARTMENT_OTHER): Payer: No Typology Code available for payment source

## 2016-09-01 ENCOUNTER — Ambulatory Visit (HOSPITAL_BASED_OUTPATIENT_CLINIC_OR_DEPARTMENT_OTHER): Payer: No Typology Code available for payment source | Admitting: Nurse Practitioner

## 2016-09-01 ENCOUNTER — Encounter: Payer: Self-pay | Admitting: Nurse Practitioner

## 2016-09-01 ENCOUNTER — Telehealth: Payer: Self-pay | Admitting: Medical Oncology

## 2016-09-01 VITALS — BP 135/84 | HR 69 | Temp 98.9°F | Resp 16 | Ht 73.0 in | Wt 145.9 lb

## 2016-09-01 DIAGNOSIS — C090 Malignant neoplasm of tonsillar fossa: Secondary | ICD-10-CM

## 2016-09-01 DIAGNOSIS — R918 Other nonspecific abnormal finding of lung field: Secondary | ICD-10-CM | POA: Diagnosis not present

## 2016-09-01 DIAGNOSIS — R05 Cough: Secondary | ICD-10-CM | POA: Diagnosis present

## 2016-09-01 DIAGNOSIS — R509 Fever, unspecified: Secondary | ICD-10-CM | POA: Diagnosis not present

## 2016-09-01 DIAGNOSIS — C099 Malignant neoplasm of tonsil, unspecified: Secondary | ICD-10-CM

## 2016-09-01 DIAGNOSIS — E860 Dehydration: Secondary | ICD-10-CM

## 2016-09-01 DIAGNOSIS — J40 Bronchitis, not specified as acute or chronic: Secondary | ICD-10-CM

## 2016-09-01 DIAGNOSIS — R197 Diarrhea, unspecified: Secondary | ICD-10-CM

## 2016-09-01 DIAGNOSIS — R634 Abnormal weight loss: Secondary | ICD-10-CM

## 2016-09-01 LAB — CBC WITH DIFFERENTIAL/PLATELET
BASO%: 0.5 % (ref 0.0–2.0)
BASOS ABS: 0 10*3/uL (ref 0.0–0.1)
EOS ABS: 0 10*3/uL (ref 0.0–0.5)
EOS%: 0.4 % (ref 0.0–7.0)
HEMATOCRIT: 32.6 % — AB (ref 38.4–49.9)
HGB: 11.3 g/dL — ABNORMAL LOW (ref 13.0–17.1)
LYMPH#: 0.6 10*3/uL — AB (ref 0.9–3.3)
LYMPH%: 9 % — ABNORMAL LOW (ref 14.0–49.0)
MCH: 33.7 pg — AB (ref 27.2–33.4)
MCHC: 34.7 g/dL (ref 32.0–36.0)
MCV: 97.1 fL (ref 79.3–98.0)
MONO#: 1.2 10*3/uL — ABNORMAL HIGH (ref 0.1–0.9)
MONO%: 18.2 % — ABNORMAL HIGH (ref 0.0–14.0)
NEUT#: 4.7 10*3/uL (ref 1.5–6.5)
NEUT%: 71.9 % (ref 39.0–75.0)
Platelets: 213 10*3/uL (ref 140–400)
RBC: 3.36 10*6/uL — ABNORMAL LOW (ref 4.20–5.82)
RDW: 19.4 % — ABNORMAL HIGH (ref 11.0–14.6)
WBC: 6.5 10*3/uL (ref 4.0–10.3)

## 2016-09-01 LAB — COMPREHENSIVE METABOLIC PANEL
ALBUMIN: 4.1 g/dL (ref 3.5–5.0)
ALK PHOS: 108 U/L (ref 40–150)
ALT: 18 U/L (ref 0–55)
AST: 28 U/L (ref 5–34)
Anion Gap: 10 mEq/L (ref 3–11)
BILIRUBIN TOTAL: 0.36 mg/dL (ref 0.20–1.20)
BUN: 21.3 mg/dL (ref 7.0–26.0)
CO2: 23 mEq/L (ref 22–29)
Calcium: 9.7 mg/dL (ref 8.4–10.4)
Chloride: 98 mEq/L (ref 98–109)
Creatinine: 1.4 mg/dL — ABNORMAL HIGH (ref 0.7–1.3)
EGFR: 66 mL/min/{1.73_m2} — ABNORMAL LOW (ref >90)
GLUCOSE: 125 mg/dL (ref 70–140)
Potassium: 4.4 mEq/L (ref 3.5–5.1)
SODIUM: 132 meq/L — AB (ref 136–145)
TOTAL PROTEIN: 7.8 g/dL (ref 6.4–8.3)

## 2016-09-01 MED ORDER — DIPHENOXYLATE-ATROPINE 2.5-0.025 MG PO TABS: 2.0000 | ORAL_TABLET | Freq: Four times a day (QID) | ORAL | 0 refills | Status: DC | PRN

## 2016-09-01 MED ORDER — OSMOLITE 1.5 CAL PO LIQD: ORAL | 0 refills | Status: DC

## 2016-09-01 MED ORDER — HEPARIN SOD (PORK) LOCK FLUSH 100 UNIT/ML IV SOLN
500.0000 [IU] | Freq: Once | INTRAVENOUS | Status: AC | PRN
  Administered 2016-09-01: 500 [IU]
  Filled 2016-09-01: qty 5

## 2016-09-01 MED ORDER — ACETAMINOPHEN 325 MG PO TABS
650.0000 mg | ORAL_TABLET | Freq: Once | ORAL | Status: AC
  Administered 2016-09-01: 650 mg via ORAL

## 2016-09-01 MED ORDER — SODIUM CHLORIDE 0.9 % IV SOLN
Freq: Once | INTRAVENOUS | Status: DC
Start: 2016-09-01 — End: 2016-09-01

## 2016-09-01 MED ORDER — ONDANSETRON HCL 4 MG/2ML IJ SOLN
8.0000 mg | Freq: Once | INTRAMUSCULAR | Status: AC
  Administered 2016-09-01: 8 mg via INTRAVENOUS

## 2016-09-01 MED ORDER — SODIUM CHLORIDE 0.9 % IJ SOLN
10.0000 mL | INTRAMUSCULAR | Status: DC | PRN
  Administered 2016-09-01: 10 mL
  Filled 2016-09-01: qty 10

## 2016-09-01 MED ORDER — LEVOFLOXACIN 500 MG PO TABS: 500.0000 mg | ORAL_TABLET | Freq: Every day | ORAL | 0 refills | Status: DC

## 2016-09-01 MED ORDER — SODIUM CHLORIDE 0.9 % IV SOLN
Freq: Once | INTRAVENOUS | Status: AC
  Administered 2016-09-01: 11:00:00 via INTRAVENOUS

## 2016-09-01 MED ORDER — ONDANSETRON HCL 4 MG/2ML IJ SOLN
INTRAMUSCULAR | Status: AC
  Filled 2016-09-01: qty 4

## 2016-09-01 MED ORDER — ACETAMINOPHEN 325 MG PO TABS
ORAL_TABLET | ORAL | Status: AC
  Filled 2016-09-01: qty 2

## 2016-09-01 MED FILL — DIPHENOXYLATE/ATROPINE TAB: 2.5-0.025 | 3 days supply | Qty: 30 | Fill #0

## 2016-09-01 MED FILL — levoFLOXacin 500 MG TABS: 500 | 10 days supply | Qty: 10 | Fill #0

## 2016-09-01 NOTE — Assessment & Plan Note (Signed)
Patient received his last chemotherapy on 07/03/2016.  He finished his last radiation treatments on 07/10/2016.  He is currently undergoing observation only.  He will return tomorrow for additional IV fluid rehydration.  He also is scheduled to return on Tuesday, 09/05/2016 for IV fluid rehydration as well.  He is scheduled for labs and a follow-up visit on 09/14/2016.

## 2016-09-01 NOTE — Telephone Encounter (Signed)
sw pt to confirm appt date/time Dec 30 and Jan 2 per LOS

## 2016-09-01 NOTE — Assessment & Plan Note (Signed)
Patient states that he has had several episodes of diarrhea within the past few days.  He has not taken any over-the-counter Imodium.  He feels dehydrated today and will receive additional IV fluid rehydration as well.  Patient was given a prescription for Lomotil and also given written instructions regarding the use of both the Imodium and Lomotil.

## 2016-09-01 NOTE — Progress Notes (Signed)
SYMPTOM MANAGEMENT CLINIC    Chief Complaint: Diarrhea, dehydration, bronchitis  HPI:  [name] [name] 60 y.o. 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  Patient is status post chemotherapy and radiation treatments.  Currently undergoing observation only.     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 Chronic small-vessel ischemic changes of the cerebral hemispheric white matter.       04/04/2016 Imaging    CT neck showed left tonsillar mass with diameter of 19 x 24 mm consistent with 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 necrosis. Suspicious level 5/supraclavicular nodes on the left. Scar type density in the right upper lobe is more prominent than was seen on a CT scan of the chest 10/06/2014      04/21/2016 PET scan    Asymmetric hypermetabolic soft tissue prominence in left palatine tonsil, consistent with known primary left tonsillar squamous cell Carcinoma. Left level 2 cervical lymphadenopathy, consistent with metastatic disease. No evidence of metastatic disease within the chest, abdomen, or pelvis. Stable 14 mm ground-glass and part solid nodular opacity in right lung apex compared to previous CT in 2016. This shows low-grade metabolic activity. Low-grade bronchogenic adenocarcinoma cannot be excluded. Consider surgical resection versus continued followup by CT in 12 months      05/17/2016 Procedure    Successful placement of a 20 French pull through gastrostomy tube & right IJ approach [facility] [place].      05/22/2016 - 07/03/2016 Chemotherapy    He received 3 doses of high dose cisplatin with radiation       05/23/2016 - 07/10/2016 Radiation Therapy    Rec'd Helical IMRT:   Left tonsil and bilateral neck / 70 Gy in 35 fractions to gross disease, 63 Gy in 35 fractions to high risk nodal echelons, and 56 Gy in 35 fractions to intermediate risk nodal echelons.           Review of Systems  Constitutional: Positive for fever, malaise/fatigue and weight loss.  HENT: Positive for sore throat.   Respiratory: Positive for cough and sputum production.   Gastrointestinal: Positive for diarrhea.  Neurological: Positive for weakness.  All other systems reviewed and are negative.   Past Medical History:  Diagnosis Date  . Anxiety   . Cancer ([place]) 04/04/2016   cancer of left tonsil  . COPD (chronic obstructive pulmonary disease) ([place])   . Hepatitis   . Hepatitis C    Treatment x 8 weeks 6-7 months ago [facility]  . History of radiation therapy 05/23/16- 07/10/16   Left Tonsil and bilateral neck  . Sinus congestion   . Stomach ulcer     Past Surgical History:  Procedure Laterality Date  . COLONOSCOPY WITH PROPOFOL N/A 04/14/2015   Procedure: COLONOSCOPY WITH PROPOFOL;  Surgeon: Arta Silence, MD;  Location: [facility];  Service: Endoscopy;  Laterality: N/A;  . HERNIA REPAIR    . IR GENERIC HISTORICAL  05/17/2016   IR FLUORO GUIDE PORT INSERTION RIGHT 05/17/2016 Jacqulynn Cadet, MD [facility]  . IR GENERIC HISTORICAL  05/17/2016   IR US GUIDE VASC ACCESS RIGHT 05/17/2016 Jacqulynn Cadet, MD [facility]  . IR GENERIC HISTORICAL  05/17/2016  IR GASTROSTOMY TUBE MOD SED 05/17/2016 Jacqulynn Cadet, MD [facility]  . MULTIPLE EXTRACTIONS WITH ALVEOLOPLASTY N/A 05/03/2016   Procedure: Multiple extractions with alveoloplasty and gross debridement of remaining teeth.;  Surgeon: Lenn Cal, DDS;  Location: [facility];  Service: Oral Surgery;  Laterality: N/A;  . 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    has No Known Allergies.  Allergies as of 09/01/2016   No Known Allergies     Medication List       Accurate as of 09/01/16  5:14 PM. Always use your most recent med list.          diphenoxylate-atropine 2.5-0.025 MG tablet Commonly known as:  LOMOTIL Take 2 tablets by mouth 4 (four) times daily as needed for diarrhea or loose stools.   Ethyl Alcohol (Skin Cleanser) 65 % Foam Commonly known as:  PURELL INSTANT HAND SANITIZER Use as needed to sanitize hands   feeding supplement (OSMOLITE 1.5 CAL) Liqd Begin Osmolite 1.5 via PEG one half cans 4 times a day with 120 cc free water before and after bolus feeding.  Please send formula and supplies ASAP.   levofloxacin 500 MG tablet Commonly known as:  LEVAQUIN Take 1 tablet (500 mg total) by mouth daily.   lidocaine-prilocaine cream Commonly known as:  EMLA Apply to affected area once   morphine CONCENTRATE 10 mg / 0.5 ml concentrated solution Take 0.5 mLs (10 mg total) by mouth every 2 (two) hours as needed for severe pain.   ondansetron 8 MG tablet Commonly known as:  ZOFRAN Take 1 tablet (8 mg total) by mouth every 8 (eight) hours as needed. Start on the third day after chemotherapy.   polyethylene glycol packet Commonly known as:  MIRALAX Take 17 g by mouth daily.   prochlorperazine 10 MG tablet Commonly known as:  COMPAZINE Take 1 tablet (10 mg total) by mouth every 6 (six) hours as needed (Nausea or vomiting).   sodium fluoride 1.1 % Gel dental gel Commonly known as:  FLUORISHIELD Instill one drop of  gel per tooth space of fluoride tray. Place over teeth for 5 minutes. Remove. Spit out excess. Repeat nightly.        PHYSICAL EXAMINATION  Oncology Vitals 09/01/2016 09/01/2016  Height - 185 cm  Weight - 66.18 kg  Weight (lbs) - 145 lbs 14 oz  BMI (kg/m2) - 19.25 kg/m2  Temp 98.9 99.3  Pulse 69 68  Resp - 16  SpO2 100 100  BSA (m2) - 1.85 m2   BP Readings from Last 2 Encounters:  09/01/16 135/84  08/25/16 119/78    Physical Exam  Constitutional: He is oriented to person, place, and time. He appears malnourished and dehydrated. He appears unhealthy. He appears cachectic.  HENT:  Head: Normocephalic and atraumatic.  Mouth/Throat: Oropharynx is clear and moist.  Eyes: Conjunctivae and EOM are normal. Pupils are equal, round, and reactive to light. Right eye exhibits no discharge. Left eye exhibits no discharge. No scleral icterus.  Neck: Normal range of motion. Neck supple. No JVD present.  No tracheal deviation present. No thyromegaly present.  Cardiovascular: Normal rate, regular rhythm, normal heart sounds and intact distal pulses.   Pulmonary/Chest: Effort normal and breath sounds normal. No respiratory distress. He has no wheezes. He has no rales. He exhibits no tenderness.  Congested cough  Abdominal: Soft. Bowel sounds are normal. He exhibits no distension and no mass. There is no tenderness. There is no rebound and no guarding.  Musculoskeletal: Normal range of motion. He exhibits no edema, tenderness or deformity.  Lymphadenopathy:    He has no cervical adenopathy.  Neurological: He is alert and oriented to person, place, and time. Gait normal.  Skin: Skin is warm and dry. No rash noted. No erythema. No pallor.  Psychiatric: Affect normal.  Nursing note and vitals reviewed.   LABORATORY DATA:. Appointment on 09/01/2016  Component Date Value Ref Range Status  . WBC 09/01/2016 6.5  4.0 - 10.3 10e3/uL Final  . NEUT# 09/01/2016 4.7  1.5 - 6.5 10e3/uL Final  . HGB  09/01/2016 11.3* 13.0 - 17.1 g/dL Final  . HCT 09/01/2016 32.6* 38.4 - 49.9 % Final  . Platelets 09/01/2016 213  140 - 400 10e3/uL Final  . MCV 09/01/2016 97.1  79.3 - 98.0 fL Final  . MCH 09/01/2016 33.7* 27.2 - 33.4 pg Final  . MCHC 09/01/2016 34.7  32.0 - 36.0 g/dL Final  . RBC 09/01/2016 3.36* 4.20 - 5.82 10e6/uL Final  . RDW 09/01/2016 19.4* 11.0 - 14.6 % Final  . lymph# 09/01/2016 0.6* 0.9 - 3.3 10e3/uL Final  . MONO# 09/01/2016 1.2* 0.1 - 0.9 10e3/uL Final  . Eosinophils Absolute 09/01/2016 0.0  0.0 - 0.5 10e3/uL Final  . Basophils Absolute 09/01/2016 0.0  0.0 - 0.1 10e3/uL Final  . NEUT% 09/01/2016 71.9  39.0 - 75.0 % Final  . LYMPH% 09/01/2016 9.0* 14.0 - 49.0 % Final  . MONO% 09/01/2016 18.2* 0.0 - 14.0 % Final  . EOS% 09/01/2016 0.4  0.0 - 7.0 % Final  . BASO% 09/01/2016 0.5  0.0 - 2.0 % Final  . Sodium 09/01/2016 132* 136 - 145 mEq/L Final  . Potassium 09/01/2016 4.4  3.5 - 5.1 mEq/L Final  . Chloride 09/01/2016 98  98 - 109 mEq/L Final  . CO2 09/01/2016 23  22 - 29 mEq/L Final  . Glucose 09/01/2016 125  70 - 140 mg/dl Final  . BUN 09/01/2016 21.3  7.0 - 26.0 mg/dL Final  . Creatinine 09/01/2016 1.4* 0.7 - 1.3 mg/dL Final  . Total Bilirubin 09/01/2016 0.36  0.20 - 1.20 mg/dL Final  . Alkaline Phosphatase 09/01/2016 108  40 - 150 U/L Final  . AST 09/01/2016 28  5 - 34 U/L Final  . ALT 09/01/2016 18  0 - 55 U/L Final  . Total Protein 09/01/2016 7.8  6.4 - 8.3 g/dL Final  . Albumin 09/01/2016 4.1  3.5 - 5.0 g/dL Final  . Calcium 09/01/2016 9.7  8.4 - 10.4 mg/dL Final  . Anion Gap 09/01/2016 10  3 - 11 mEq/L Final  . EGFR 09/01/2016 66* >90 ml/min/1.73 m2 Final    RADIOGRAPHIC STUDIES: Dg Chest 2 View  Result Date: 09/01/2016 CLINICAL DATA:  Productive cough, fever. EXAM: CHEST  2 VIEW COMPARISON:  Radiographs of April 04, 2016. FINDINGS: The heart size and mediastinal contours are within normal limits. No pneumothorax or pleural effusion is noted. Interval placement of  right internal jugular Port-A-Cath with distal tip in expected position of SVC. Left lung is clear. Stable irregular density seen in  apex compared to prior exam. The visualized skeletal structures are unremarkable. IMPRESSION: Stable irregular density seen in right lung apex. Interval placement of right internal jugular Port-A-Cath. No other significant change compared to prior exam. Electronically Signed   By: Lupita Raider, M.D.   On: 09/01/2016 15:01    ASSESSMENT/PLAN:    Unintentional weight loss Patient has a PEG tube that he was using to receive his tube feeding for nutrition.  He states that his throat was feeling much better.  Approximately one month ago; so he stopped his tube feedings and begin taking all of his nutrition orally instead.  Within the past month-patient states that his throat has become sore again and he is much more tired.  He has not been  taking in but minimal nutrition and very poor oral intake as well.  He has not been using his PEG tube for tube feedings.  He feels dehydrated today.  He has lost weight as well.  Patient was advised to push protein and fluids is much as possible.  Also, he met with the cancer Center nutritionist today as well.  He was encouraged to start using the tube feeds via PEG tube for nutrition.   Malignant neoplasm of tonsillar fossa [facility]) Patient received his last chemotherapy on 07/03/2016.  He finished his last radiation treatments on 07/10/2016.  He is currently undergoing observation only.  He will return tomorrow for additional IV fluid rehydration.  He also is scheduled to return on Tuesday, 09/05/2016 for IV fluid rehydration as well.  He is scheduled for labs and a follow-up visit on 09/14/2016.  Diarrhea Patient states that he has had several episodes of diarrhea within the past few days.  He has not taken any over-the-counter Imodium.  He feels dehydrated today and will receive additional IV fluid rehydration as well.  Patient  was given a prescription for Lomotil and also given written instructions regarding the use of both the Imodium and Lomotil.  Dehydration Patient has been experiencing episodes of diarrhea and has had very poor oral intake and nutrition recently.  He appears dehydrated.  Will receive IV fluid rehydration.  Both today, tomorrow, and Tuesday, 09/05/2016 as well.  He was also encouraged to push fluids and begin using his tube feedings.  He has PEG again.  Bronchitis Patient states that he has had a cold that has now developed into a very congested cough with dark brown secretions.  He denies any hemoptysis.  He is unsure if he's had any fevers.  Exam today reveals a very congested cough; but lungs are clear with auscultation.  There is no wheezing noted.  Of note-patient's 2 paternal was 99.3, lot the cancer Center.  Patient appears nontoxic.  Patient will receive Tylenol at the cancer center.  Chest x-ray obtained today revealed no acute findings.  Patient will be prescribed Levaquin antibiotics for treatment of any possible bronchitis issues.  He was advised to call/return or go directly to the emergency department over the holiday weekend for any worsening symptoms whatsoever.   Patient stated understanding of all instructions; and was in agreement with this plan of care. The patient knows to call the clinic with any problems, questions or concerns.   Total time spent with patient was 40 minutes;  with greater than 75 percent of that time spent in face to face counseling regarding patient's symptoms,  and coordination of care and follow up.  Disclaimer:This dictation was prepared with Dragon/digital dictation along with [employer]. Any  transcriptional errors that result from this process are unintentional.  Drue Second, NP 09/01/2016

## 2016-09-01 NOTE — Assessment & Plan Note (Signed)
Patient has a PEG tube that he was using to receive his tube feeding for nutrition.  He states that his throat was feeling much better.  Approximately one month ago; so he stopped his tube feedings and begin taking all of his nutrition orally instead.  Within the past month-patient states that his throat has become sore again and he is much more tired.  He has not been  taking in but minimal nutrition and very poor oral intake as well.  He has not been using his PEG tube for tube feedings.  He feels dehydrated today.  He has lost weight as well.  Patient was advised to push protein and fluids is much as possible.  Also, he met with the [place] nutritionist today as well.  He was encouraged to start using the tube feeds via PEG tube for nutrition.

## 2016-09-01 NOTE — Telephone Encounter (Signed)
Walked in -not feeling well. Dr Alvy Bimler nurse to f/u.

## 2016-09-01 NOTE — Assessment & Plan Note (Signed)
Patient has been experiencing episodes of diarrhea and has had very poor oral intake and nutrition recently.  He appears dehydrated.  Will receive IV fluid rehydration.  Both today, tomorrow, and Tuesday, 09/05/2016 as well.  He was also encouraged to push fluids and begin using his tube feedings.  He has PEG again.

## 2016-09-01 NOTE — Assessment & Plan Note (Signed)
Patient states that he has had a cold that has now developed into a very congested cough with dark brown secretions.  He denies any hemoptysis.  He is unsure if he's had any fevers.  Exam today reveals a very congested cough; but lungs are clear with auscultation.  There is no wheezing noted.  Of note-patient's 2 paternal was 99.3, lot the cancer Center.  Patient appears nontoxic.  Patient will receive Tylenol at the cancer center.  Chest x-ray obtained today revealed no acute findings.  Patient will be prescribed Levaquin antibiotics for treatment of any possible bronchitis issues.  He was advised to call/return or go directly to the emergency department over the holiday weekend for any worsening symptoms whatsoever.

## 2016-09-01 NOTE — Telephone Encounter (Signed)
Oncology Nurse Navigator Documentation  Received call from Mr. Halfpenny with c/o extreme weakness for past 3 days. He stated he has been eating/drinking "but maybe not enough". I encouraged him to come to [facility] for evaluation, he understands I will make arrangements for him to by seen by SM or Dr. Alvy Bimler.  Gayleen Orem, RN, BSN, [place] Oncology Nurse [place] [facility] [place] [phone]

## 2016-09-01 NOTE — Patient Instructions (Signed)
Dehydration, Adult Dehydration is a condition in which there is not enough fluid or water in the body. This happens when you lose more fluids than you take in. Important organs, such as the kidneys, brain, and heart, cannot function without a proper amount of fluids. Any loss of fluids from the body can lead to dehydration. Dehydration can range from mild to severe. This condition should be treated right away to prevent it from becoming severe. What are the causes? This condition may be caused by:  Vomiting.  Diarrhea.  Excessive sweating, such as from heat exposure or exercise.  Not drinking enough fluid, especially:  When ill.  While doing activity that requires a lot of energy.  Excessive urination.  Fever.  Infection.  Certain medicines, such as medicines that cause the body to lose excess fluid (diuretics).  Inability to access safe drinking water.  Reduced physical ability to get adequate water and food. What increases the risk? This condition is more likely to develop in people:  Who have a poorly controlled long-term (chronic) illness, such as diabetes, heart disease, or kidney disease.  Who are age 65 or older.  Who are disabled.  Who live in a place with high altitude.  Who play endurance sports. What are the signs or symptoms? Symptoms of mild dehydration may include:   Thirst.  Dry lips.  Slightly dry mouth.  Dry, warm skin.  Dizziness. Symptoms of moderate dehydration may include:   Very dry mouth.  Muscle cramps.  Dark urine. Urine may be the color of tea.  Decreased urine production.  Decreased tear production.  Heartbeat that is irregular or faster than normal (palpitations).  Headache.  Light-headedness, especially when you stand up from a sitting position.  Fainting (syncope). Symptoms of severe dehydration may include:   Changes in skin, such as:  Cold and clammy skin.  Blotchy (mottled) or pale skin.  Skin that does  not quickly return to normal after being lightly pinched and released (poor skin turgor).  Changes in body fluids, such as:  Extreme thirst.  No tear production.  Inability to sweat when body temperature is high, such as in hot weather.  Very little urine production.  Changes in vital signs, such as:  Weak pulse.  Pulse that is more than 100 beats a minute when sitting still.  Rapid breathing.  Low blood pressure.  Other changes, such as:  Sunken eyes.  Cold hands and feet.  Confusion.  Lack of energy (lethargy).  Difficulty waking up from sleep.  Short-term weight loss.  Unconsciousness. How is this diagnosed? This condition is diagnosed based on your symptoms and a physical exam. Blood and urine tests may be done to help confirm the diagnosis. How is this treated? Treatment for this condition depends on the severity. Mild or moderate dehydration can often be treated at home. Treatment should be started right away. Do not wait until dehydration becomes severe. Severe dehydration is an emergency and it needs to be treated in a hospital. Treatment for mild dehydration may include:   Drinking more fluids.  Replacing salts and minerals in your blood (electrolytes) that you may have lost. Treatment for moderate dehydration may include:   Drinking an oral rehydration solution (ORS). This is a drink that helps you replace fluids and electrolytes (rehydrate). It can be found at pharmacies and retail stores. Treatment for severe dehydration may include:   Receiving fluids through an IV tube.  Receiving an electrolyte solution through a feeding tube that is   passed through your nose and into your stomach (nasogastric tube, or NG tube).  Correcting any abnormalities in electrolytes.  Treating the underlying cause of dehydration. Follow these instructions at home:  If directed by your health care provider, drink an ORS:  Make an ORS by following instructions on the  package.  Start by drinking small amounts, about  cup (120 mL) every 5-10 minutes.  Slowly increase how much you drink until you have taken the amount recommended by your health care provider.  Drink enough clear fluid to keep your urine clear or pale yellow. If you were told to drink an ORS, finish the ORS first, then start slowly drinking other clear fluids. Drink fluids such as:  Water. Do not drink only water. Doing that can lead to having too little salt (sodium) in the body (hyponatremia).  Ice chips.  Fruit juice that you have added water to (diluted fruit juice).  Low-calorie sports drinks.  Avoid:  Alcohol.  Drinks that contain a lot of sugar. These include high-calorie sports drinks, fruit juice that is not diluted, and soda.  Caffeine.  Foods that are greasy or contain a lot of fat or sugar.  Take over-the-counter and prescription medicines only as told by your health care provider.  Do not take sodium tablets. This can lead to having too much sodium in the body (hypernatremia).  Eat foods that contain a healthy balance of electrolytes, such as bananas, oranges, potatoes, tomatoes, and spinach.  Keep all follow-up visits as told by your health care provider. This is important. Contact a health care provider if:  You have abdominal pain that:  Gets worse.  Stays in one area (localizes).  You have a rash.  You have a stiff neck.  You are more irritable than usual.  You are sleepier or more difficult to wake up than usual.  You feel weak or dizzy.  You feel very thirsty.  You have urinated only a small amount of very dark urine over 6-8 hours. Get help right away if:  You have symptoms of severe dehydration.  You cannot drink fluids without vomiting.  Your symptoms get worse with treatment.  You have a fever.  You have a severe headache.  You have vomiting or diarrhea that:  Gets worse.  Does not go away.  You have blood or green matter  (bile) in your vomit.  You have blood in your stool. This may cause stool to look black and tarry.  You have not urinated in 6-8 hours.  You faint.  Your heart rate while sitting still is over 100 beats a minute.  You have trouble breathing. This information is not intended to replace advice given to you by your health care provider. Make sure you discuss any questions you have with your health care provider. Document Released: 08/21/2005 Document Revised: 03/17/2016 Document Reviewed: 10/15/2015 [employer]vier Interactive Patient Education  2017 [employer]  

## 2016-09-01 NOTE — Progress Notes (Signed)
Nutrition follow-up completed with patient during IVF. Patient is status post treatment for tonsil cancer. Weight documented as 145.9 pounds on December 29 improved from 138.5 pounds, December 13. Labs reviewed; noted sodium 132, creatinine 1.4, and albumin 4.1. Patient reports he has been unable to eat or drink in the last few days. He complains of a sore throat and feeling terrible. He reports he does not think he can swallow Ensure Plus or food at this time.  Revised estimated nutrition needs: 2220-2420 calories, 85-100 grams protein, 2.4 L fluid.  Nutrition diagnosis: Inadequate oral intake continues.  Intervention: Encouraged patient to drink as much water as he possibly can recommending approximately 4 - 16 ounce bottles daily. Recommended patient try to drink Ensure Plus by mouth. If unable, patient will begin Osmolite 1.5 via PEG, 1-1/2 cans 4 times a day with 60 cc free water before and after bolus feeding. [facility] was notified. Teach back method used.  Questions were answered.  Monitoring, evaluation, goals:  Patient will tolerate tube feedings plus oral intake to meet greater than 90% of minimum estimated needs.  Next visit: I will follow-up as needed with patient.  **Disclaimer: This note was dictated with voice recognition software. Similar sounding words can inadvertently be transcribed and this note may contain transcription errors which may not have been corrected upon publication of note.**

## 2016-09-02 ENCOUNTER — Ambulatory Visit (HOSPITAL_BASED_OUTPATIENT_CLINIC_OR_DEPARTMENT_OTHER): Payer: No Typology Code available for payment source

## 2016-09-02 VITALS — BP 121/92 | HR 92 | Temp 98.6°F | Resp 16

## 2016-09-02 DIAGNOSIS — C090 Malignant neoplasm of tonsillar fossa: Secondary | ICD-10-CM

## 2016-09-02 MED ORDER — HEPARIN SOD (PORK) LOCK FLUSH 100 UNIT/ML IV SOLN
500.0000 [IU] | Freq: Once | INTRAVENOUS | Status: AC | PRN
  Administered 2016-09-02: 500 [IU]
  Filled 2016-09-02: qty 5

## 2016-09-02 MED ORDER — SODIUM CHLORIDE 0.9 % IV SOLN
Freq: Once | INTRAVENOUS | Status: AC
  Administered 2016-09-02: 10:00:00 via INTRAVENOUS

## 2016-09-02 MED ORDER — SODIUM CHLORIDE 0.9 % IJ SOLN
10.0000 mL | INTRAMUSCULAR | Status: DC | PRN
  Administered 2016-09-02: 10 mL
  Filled 2016-09-02: qty 10

## 2016-09-02 NOTE — Patient Instructions (Signed)
Dehydration, Adult Dehydration is a condition in which there is not enough fluid or water in the body. This happens when you lose more fluids than you take in. Important organs, such as the kidneys, brain, and heart, cannot function without a proper amount of fluids. Any loss of fluids from the body can lead to dehydration. Dehydration can range from mild to severe. This condition should be treated right away to prevent it from becoming severe. What are the causes? This condition may be caused by:  Vomiting.  Diarrhea.  Excessive sweating, such as from heat exposure or exercise.  Not drinking enough fluid, especially:  When ill.  While doing activity that requires a lot of energy.  Excessive urination.  Fever.  Infection.  Certain medicines, such as medicines that cause the body to lose excess fluid (diuretics).  Inability to access safe drinking water.  Reduced physical ability to get adequate water and food. What increases the risk? This condition is more likely to develop in people:  Who have a poorly controlled long-term (chronic) illness, such as diabetes, heart disease, or kidney disease.  Who are age 65 or older.  Who are disabled.  Who live in a place with high altitude.  Who play endurance sports. What are the signs or symptoms? Symptoms of mild dehydration may include:   Thirst.  Dry lips.  Slightly dry mouth.  Dry, warm skin.  Dizziness. Symptoms of moderate dehydration may include:   Very dry mouth.  Muscle cramps.  Dark urine. Urine may be the color of tea.  Decreased urine production.  Decreased tear production.  Heartbeat that is irregular or faster than normal (palpitations).  Headache.  Light-headedness, especially when you stand up from a sitting position.  Fainting (syncope). Symptoms of severe dehydration may include:   Changes in skin, such as:  Cold and clammy skin.  Blotchy (mottled) or pale skin.  Skin that does  not quickly return to normal after being lightly pinched and released (poor skin turgor).  Changes in body fluids, such as:  Extreme thirst.  No tear production.  Inability to sweat when body temperature is high, such as in hot weather.  Very little urine production.  Changes in vital signs, such as:  Weak pulse.  Pulse that is more than 100 beats a minute when sitting still.  Rapid breathing.  Low blood pressure.  Other changes, such as:  Sunken eyes.  Cold hands and feet.  Confusion.  Lack of energy (lethargy).  Difficulty waking up from sleep.  Short-term weight loss.  Unconsciousness. How is this diagnosed? This condition is diagnosed based on your symptoms and a physical exam. Blood and urine tests may be done to help confirm the diagnosis. How is this treated? Treatment for this condition depends on the severity. Mild or moderate dehydration can often be treated at home. Treatment should be started right away. Do not wait until dehydration becomes severe. Severe dehydration is an emergency and it needs to be treated in a hospital. Treatment for mild dehydration may include:   Drinking more fluids.  Replacing salts and minerals in your blood (electrolytes) that you may have lost. Treatment for moderate dehydration may include:   Drinking an oral rehydration solution (ORS). This is a drink that helps you replace fluids and electrolytes (rehydrate). It can be found at pharmacies and retail stores. Treatment for severe dehydration may include:   Receiving fluids through an IV tube.  Receiving an electrolyte solution through a feeding tube that is   passed through your nose and into your stomach (nasogastric tube, or NG tube).  Correcting any abnormalities in electrolytes.  Treating the underlying cause of dehydration. Follow these instructions at home:  If directed by your health care provider, drink an ORS:  Make an ORS by following instructions on the  package.  Start by drinking small amounts, about  cup (120 mL) every 5-10 minutes.  Slowly increase how much you drink until you have taken the amount recommended by your health care provider.  Drink enough clear fluid to keep your urine clear or pale yellow. If you were told to drink an ORS, finish the ORS first, then start slowly drinking other clear fluids. Drink fluids such as:  Water. Do not drink only water. Doing that can lead to having too little salt (sodium) in the body (hyponatremia).  Ice chips.  Fruit juice that you have added water to (diluted fruit juice).  Low-calorie sports drinks.  Avoid:  Alcohol.  Drinks that contain a lot of sugar. These include high-calorie sports drinks, fruit juice that is not diluted, and soda.  Caffeine.  Foods that are greasy or contain a lot of fat or sugar.  Take over-the-counter and prescription medicines only as told by your health care provider.  Do not take sodium tablets. This can lead to having too much sodium in the body (hypernatremia).  Eat foods that contain a healthy balance of electrolytes, such as bananas, oranges, potatoes, tomatoes, and spinach.  Keep all follow-up visits as told by your health care provider. This is important. Contact a health care provider if:  You have abdominal pain that:  Gets worse.  Stays in one area (localizes).  You have a rash.  You have a stiff neck.  You are more irritable than usual.  You are sleepier or more difficult to wake up than usual.  You feel weak or dizzy.  You feel very thirsty.  You have urinated only a small amount of very dark urine over 6-8 hours. Get help right away if:  You have symptoms of severe dehydration.  You cannot drink fluids without vomiting.  Your symptoms get worse with treatment.  You have a fever.  You have a severe headache.  You have vomiting or diarrhea that:  Gets worse.  Does not go away.  You have blood or green matter  (bile) in your vomit.  You have blood in your stool. This may cause stool to look black and tarry.  You have not urinated in 6-8 hours.  You faint.  Your heart rate while sitting still is over 100 beats a minute.  You have trouble breathing. This information is not intended to replace advice given to you by your health care provider. Make sure you discuss any questions you have with your health care provider. Document Released: 08/21/2005 Document Revised: 03/17/2016 Document Reviewed: 10/15/2015 [employer]vier Interactive Patient Education  2017 [employer]  

## 2016-09-02 NOTE — Progress Notes (Signed)
Patient states he was seen in [facility] yesterday. Symptoms remain unchanged. Advised patient to continue treatment as ordered by NP and to notify clinic or go to ED if symptoms worsen. Verbalizes understanding.

## 2016-09-05 ENCOUNTER — Ambulatory Visit (HOSPITAL_BASED_OUTPATIENT_CLINIC_OR_DEPARTMENT_OTHER): Payer: Medicaid Other | Admitting: Nurse Practitioner

## 2016-09-05 ENCOUNTER — Ambulatory Visit (HOSPITAL_BASED_OUTPATIENT_CLINIC_OR_DEPARTMENT_OTHER): Payer: Medicaid Other | Admitting: Hematology and Oncology

## 2016-09-05 ENCOUNTER — Encounter: Payer: Self-pay | Admitting: Hematology and Oncology

## 2016-09-05 VITALS — BP 122/83 | HR 72 | Temp 98.6°F | Resp 17 | Wt 142.2 lb

## 2016-09-05 DIAGNOSIS — C090 Malignant neoplasm of tonsillar fossa: Secondary | ICD-10-CM | POA: Diagnosis present

## 2016-09-05 DIAGNOSIS — N179 Acute kidney failure, unspecified: Secondary | ICD-10-CM

## 2016-09-05 DIAGNOSIS — R432 Parageusia: Secondary | ICD-10-CM | POA: Insufficient documentation

## 2016-09-05 MED ORDER — SODIUM CHLORIDE 0.9 % IJ SOLN
10.0000 mL | INTRAMUSCULAR | Status: DC | PRN
  Administered 2016-09-05: 10 mL
  Filled 2016-09-05: qty 10

## 2016-09-05 MED ORDER — HEPARIN SOD (PORK) LOCK FLUSH 100 UNIT/ML IV SOLN
500.0000 [IU] | Freq: Once | INTRAVENOUS | Status: AC | PRN
  Administered 2016-09-05: 500 [IU]
  Filled 2016-09-05: qty 5

## 2016-09-05 MED ORDER — SODIUM CHLORIDE 0.9 % IV SOLN
Freq: Once | INTRAVENOUS | Status: AC
  Administered 2016-09-05: 10:00:00 via INTRAVENOUS

## 2016-09-05 NOTE — Assessment & Plan Note (Signed)
This is improving with IV fluid support. I encouraged him to increase oral intake as tolerated

## 2016-09-05 NOTE — Assessment & Plan Note (Signed)
The patient had problems not able to take adequate oral intake due to altered taste sensation. Clinically, I reassured the patient he is improving and continue to encourage him to increase oral intake as tolerated. If he is able to maintain his weight in his next visit, we will consider getting the feeding tube removed

## 2016-09-05 NOTE — Progress Notes (Signed)
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 Chronic small-vessel ischemic changes of the cerebral hemispheric white matter.       04/04/2016 Imaging    CT neck showed left tonsillar mass with diameter of 19 x 24 mm consistent with 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 necrosis. Suspicious level 5/supraclavicular nodes on the left. Scar type density in the right upper lobe is more prominent than was seen on a CT scan of the chest 10/06/2014      04/21/2016 PET scan    Asymmetric hypermetabolic soft tissue prominence in left palatine tonsil, consistent with known primary left tonsillar squamous cell Carcinoma. Left level 2 cervical lymphadenopathy, consistent with metastatic disease. No evidence of metastatic disease within the chest, abdomen, or pelvis. Stable 14 mm ground-glass and part solid nodular opacity in right lung apex compared to previous CT in 2016. This shows low-grade metabolic activity. Low-grade bronchogenic adenocarcinoma cannot be excluded. Consider surgical resection versus continued followup by CT in 12 months      05/17/2016 Procedure    Successful placement of a 20 French pull  through gastrostomy tube & right IJ approach Power [place].      05/22/2016 - 07/03/2016 Chemotherapy    He received 3 doses of high dose cisplatin with radiation       05/23/2016 - 07/10/2016 Radiation Therapy    Rec'd Helical IMRT:  Left tonsil and bilateral neck / 70 Gy in 35 fractions to gross disease, 63 Gy in 35 fractions to high risk nodal echelons, and 56 Gy in 35 fractions to intermediate risk nodal echelons.           INTERVAL HISTORY: Please see below for problem oriented charting. He is seen in the treatment room for supportive care. He is able to swallow soft diet. He is not eating much but is able to tolerate nutritional supplement. He stated that the altered taste is prohibiting him from eating normally. He denies recent choking. Denies pain  REVIEW OF SYSTEMS:   Constitutional: Denies fevers, chills or abnormal weight loss Eyes: Denies blurriness of vision Ears, nose, mouth, throat, and face: Denies mucositis or sore throat Respiratory: Denies cough, dyspnea or wheezes Cardiovascular: Denies palpitation, chest discomfort or lower extremity swelling Gastrointestinal:  Denies nausea, heartburn or change in bowel habits Skin: Denies abnormal skin rashes Lymphatics: Denies new lymphadenopathy or easy bruising Neurological:Denies numbness, tingling or new weaknesses Behavioral/Psych: Mood is stable, no new changes  All other systems were reviewed with the patient and are negative.  I have reviewed the past medical history, past surgical history, social history and family history with the patient and they are unchanged from previous note.  ALLERGIES:  has No Known Allergies.  MEDICATIONS:  Current  Outpatient Prescriptions  Medication Sig Dispense Refill  . diphenoxylate-atropine (LOMOTIL) 2.5-0.025 MG tablet Take 2 tablets by mouth 4 (four) times daily as needed for diarrhea or loose stools. 30 tablet 0  . Ethyl Alcohol, Skin Cleanser, (PURELL INSTANT HAND  SANITIZER) 65 % FOAM Use as needed to sanitize hands 160 mL 5  . levofloxacin (LEVAQUIN) 500 MG tablet Take 1 tablet (500 mg total) by mouth daily. 10 tablet 0  . lidocaine-prilocaine (EMLA) cream Apply to affected area once 30 g 3  . Morphine Sulfate (MORPHINE CONCENTRATE) 10 mg / 0.5 ml concentrated solution Take 0.5 mLs (10 mg total) by mouth every 2 (two) hours as needed for severe pain. (Patient not taking: Reported on 09/01/2016) 240 mL 0  . Nutritional Supplements (FEEDING SUPPLEMENT, OSMOLITE 1.5 CAL,) LIQD Begin Osmolite 1.5 via PEG one half cans 4 times a day with 120 cc free water before and after bolus feeding.  Please send formula and supplies ASAP. 6 Bottle 0  . ondansetron (ZOFRAN) 8 MG tablet Take 1 tablet (8 mg total) by mouth every 8 (eight) hours as needed. Start on the third day after chemotherapy. 60 tablet 1  . polyethylene glycol (MIRALAX) packet Take 17 g by mouth daily. 14 each 9  . prochlorperazine (COMPAZINE) 10 MG tablet Take 1 tablet (10 mg total) by mouth every 6 (six) hours as needed (Nausea or vomiting). 90 tablet 3  . sodium fluoride (FLUORISHIELD) 1.1 % GEL dental gel Instill one drop of gel per tooth space of fluoride tray. Place over teeth for 5 minutes. Remove. Spit out excess. Repeat nightly. 120 mL prn   Current Facility-Administered Medications  Medication Dose Route Frequency Provider Last Rate Last Dose  . dexamethasone (DECADRON) injection 10 mg  10 mg Intravenous Once Heath Lark, MD      . LORazepam (ATIVAN) injection 0.5 mg  0.5 mg Intravenous Once Heath Lark, MD       Facility-Administered Medications Ordered in Other Visits  Medication Dose Route Frequency Provider Last Rate Last Dose  . sodium chloride 0.9 % injection 10 mL  10 mL Intracatheter PRN Heath Lark, MD   10 mL at 09/05/16 1210  . sodium chloride flush (NS) 0.9 % injection 10 mL  10 mL Intracatheter PRN Heath Lark, MD   10 mL at 05/24/16 1615    PHYSICAL EXAMINATION: ECOG PERFORMANCE  STATUS: 1 - Symptomatic but completely ambulatory GENERAL:alert, no distress and comfortable SKIN: skin color, texture, turgor are normal, no rashes or significant lesions EYES: normal, Conjunctiva are pink and non-injected, sclera clear Musculoskeletal:no cyanosis of digits and no clubbing  NEURO: alert & oriented x 3 with fluent speech, no focal motor/sensory deficits  LABORATORY DATA:  I have reviewed the data as listed    Component Value Date/Time   NA 132 (L) 09/01/2016 0951   K 4.4 09/01/2016 0951   CL 105 05/17/2016 1250   CO2 23 09/01/2016 0951   GLUCOSE 125 09/01/2016 0951   BUN 21.3 09/01/2016 0951   CREATININE 1.4 (H) 09/01/2016 0951   CALCIUM 9.7 09/01/2016 0951   PROT 7.8 09/01/2016 0951   ALBUMIN 4.1 09/01/2016 0951   AST 28 09/01/2016 0951   ALT 18 09/01/2016 0951   ALKPHOS 108 09/01/2016 0951   BILITOT 0.36 09/01/2016 0951   GFRNONAA >60 05/17/2016 1250   GFRAA >60 05/17/2016 1250    No results found for: SPEP, UPEP  Lab Results  Component Value Date   WBC 6.5 09/01/2016   NEUTROABS  4.7 09/01/2016   HGB 11.3 (L) 09/01/2016   HCT 32.6 (L) 09/01/2016   MCV 97.1 09/01/2016   PLT 213 09/01/2016      Chemistry      Component Value Date/Time   NA 132 (L) 09/01/2016 0951   K 4.4 09/01/2016 0951   CL 105 05/17/2016 1250   CO2 23 09/01/2016 0951   BUN 21.3 09/01/2016 0951   CREATININE 1.4 (H) 09/01/2016 0951      Component Value Date/Time   CALCIUM 9.7 09/01/2016 0951   ALKPHOS 108 09/01/2016 0951   AST 28 09/01/2016 0951   ALT 18 09/01/2016 0951   BILITOT 0.36 09/01/2016 0951       RADIOGRAPHIC STUDIES: I have personally reviewed the radiological images as listed and agreed with the findings in the report. Dg Chest 2 View  Result Date: 09/01/2016 CLINICAL DATA:  Productive cough, fever. EXAM: CHEST  2 VIEW COMPARISON:  Radiographs of April 04, 2016. FINDINGS: The heart size and mediastinal contours are within normal limits. No pneumothorax or  pleural effusion is noted. Interval placement of right internal jugular Port-A-Cath with distal tip in expected position of SVC. Left lung is clear. Stable irregular density seen in apex compared to prior exam. The visualized skeletal structures are unremarkable. IMPRESSION: Stable irregular density seen in right lung apex. Interval placement of right internal jugular Port-A-Cath. No other significant change compared to prior exam. Electronically Signed   By: Marijo Conception, M.D.   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 He will complete IV fluid support today I plan to see him back again next week for further follow-up He will be due for restaging PET CT scan in March  Acute prerenal failure [facility]) This is improving with IV fluid support. I encouraged him to increase oral intake as tolerated  Dysgeusia The patient had problems not able to take adequate oral intake due to altered taste sensation. Clinically, I reassured the patient he is improving and continue to encourage him to increase oral intake as tolerated. If he is able to maintain his weight in his next visit, we will consider getting the feeding tube removed   No orders of the defined types were placed in this encounter.  All questions were answered. The patient knows to call the clinic with any problems, questions or concerns. No barriers to learning was detected. I spent 15 minutes counseling the patient face to face. The total time spent in the appointment was 20 minutes and more than 50% was on counseling and review of test results     Heath Lark, MD 09/05/2016 12:57 PM

## 2016-09-05 NOTE — Assessment & Plan Note (Signed)
He is recovering well from treatment and has started to eat oral diet. He will complete IV fluid support today I plan to see him back again next week for further follow-up He will be due for restaging PET CT scan in March

## 2016-09-05 NOTE — Patient Instructions (Signed)
Dehydration, Adult Dehydration is a condition in which there is not enough fluid or water in the body. This happens when you lose more fluids than you take in. Important organs, such as the kidneys, brain, and heart, cannot function without a proper amount of fluids. Any loss of fluids from the body can lead to dehydration. Dehydration can range from mild to severe. This condition should be treated right away to prevent it from becoming severe. What are the causes? This condition may be caused by:  Vomiting.  Diarrhea.  Excessive sweating, such as from heat exposure or exercise.  Not drinking enough fluid, especially:  When ill.  While doing activity that requires a lot of energy.  Excessive urination.  Fever.  Infection.  Certain medicines, such as medicines that cause the body to lose excess fluid (diuretics).  Inability to access safe drinking water.  Reduced physical ability to get adequate water and food. What increases the risk? This condition is more likely to develop in people:  Who have a poorly controlled long-term (chronic) illness, such as diabetes, heart disease, or kidney disease.  Who are age 65 or older.  Who are disabled.  Who live in a place with high altitude.  Who play endurance sports. What are the signs or symptoms? Symptoms of mild dehydration may include:   Thirst.  Dry lips.  Slightly dry mouth.  Dry, warm skin.  Dizziness. Symptoms of moderate dehydration may include:   Very dry mouth.  Muscle cramps.  Dark urine. Urine may be the color of tea.  Decreased urine production.  Decreased tear production.  Heartbeat that is irregular or faster than normal (palpitations).  Headache.  Light-headedness, especially when you stand up from a sitting position.  Fainting (syncope). Symptoms of severe dehydration may include:   Changes in skin, such as:  Cold and clammy skin.  Blotchy (mottled) or pale skin.  Skin that does  not quickly return to normal after being lightly pinched and released (poor skin turgor).  Changes in body fluids, such as:  Extreme thirst.  No tear production.  Inability to sweat when body temperature is high, such as in hot weather.  Very little urine production.  Changes in vital signs, such as:  Weak pulse.  Pulse that is more than 100 beats a minute when sitting still.  Rapid breathing.  Low blood pressure.  Other changes, such as:  Sunken eyes.  Cold hands and feet.  Confusion.  Lack of energy (lethargy).  Difficulty waking up from sleep.  Short-term weight loss.  Unconsciousness. How is this diagnosed? This condition is diagnosed based on your symptoms and a physical exam. Blood and urine tests may be done to help confirm the diagnosis. How is this treated? Treatment for this condition depends on the severity. Mild or moderate dehydration can often be treated at home. Treatment should be started right away. Do not wait until dehydration becomes severe. Severe dehydration is an emergency and it needs to be treated in a hospital. Treatment for mild dehydration may include:   Drinking more fluids.  Replacing salts and minerals in your blood (electrolytes) that you may have lost. Treatment for moderate dehydration may include:   Drinking an oral rehydration solution (ORS). This is a drink that helps you replace fluids and electrolytes (rehydrate). It can be found at pharmacies and retail stores. Treatment for severe dehydration may include:   Receiving fluids through an IV tube.  Receiving an electrolyte solution through a feeding tube that is   passed through your nose and into your stomach (nasogastric tube, or NG tube).  Correcting any abnormalities in electrolytes.  Treating the underlying cause of dehydration. Follow these instructions at home:  If directed by your health care provider, drink an ORS:  Make an ORS by following instructions on the  package.  Start by drinking small amounts, about  cup (120 mL) every 5-10 minutes.  Slowly increase how much you drink until you have taken the amount recommended by your health care provider.  Drink enough clear fluid to keep your urine clear or pale yellow. If you were told to drink an ORS, finish the ORS first, then start slowly drinking other clear fluids. Drink fluids such as:  Water. Do not drink only water. Doing that can lead to having too little salt (sodium) in the body (hyponatremia).  Ice chips.  Fruit juice that you have added water to (diluted fruit juice).  Low-calorie sports drinks.  Avoid:  Alcohol.  Drinks that contain a lot of sugar. These include high-calorie sports drinks, fruit juice that is not diluted, and soda.  Caffeine.  Foods that are greasy or contain a lot of fat or sugar.  Take over-the-counter and prescription medicines only as told by your health care provider.  Do not take sodium tablets. This can lead to having too much sodium in the body (hypernatremia).  Eat foods that contain a healthy balance of electrolytes, such as bananas, oranges, potatoes, tomatoes, and spinach.  Keep all follow-up visits as told by your health care provider. This is important. Contact a health care provider if:  You have abdominal pain that:  Gets worse.  Stays in one area (localizes).  You have a rash.  You have a stiff neck.  You are more irritable than usual.  You are sleepier or more difficult to wake up than usual.  You feel weak or dizzy.  You feel very thirsty.  You have urinated only a small amount of very dark urine over 6-8 hours. Get help right away if:  You have symptoms of severe dehydration.  You cannot drink fluids without vomiting.  Your symptoms get worse with treatment.  You have a fever.  You have a severe headache.  You have vomiting or diarrhea that:  Gets worse.  Does not go away.  You have blood or green matter  (bile) in your vomit.  You have blood in your stool. This may cause stool to look black and tarry.  You have not urinated in 6-8 hours.  You faint.  Your heart rate while sitting still is over 100 beats a minute.  You have trouble breathing. This information is not intended to replace advice given to you by your health care provider. Make sure you discuss any questions you have with your health care provider. Document Released: 08/21/2005 Document Revised: 03/17/2016 Document Reviewed: 10/15/2015 [employer]vier Interactive Patient Education  2017 [employer]  

## 2016-09-06 ENCOUNTER — Encounter: Payer: Self-pay | Admitting: Nurse Practitioner

## 2016-09-06 MED FILL — PROAIR HFA 90 MCG INHALER: 108 (90 BAS | 30 days supply | Qty: 9 | Fill #0

## 2016-09-06 NOTE — Progress Notes (Signed)
RN visit only. 

## 2016-09-12 ENCOUNTER — Other Ambulatory Visit: Payer: Self-pay | Admitting: Radiation Oncology

## 2016-09-12 DIAGNOSIS — C090 Malignant neoplasm of tonsillar fossa: Secondary | ICD-10-CM

## 2016-09-12 DIAGNOSIS — R634 Abnormal weight loss: Secondary | ICD-10-CM

## 2016-09-14 ENCOUNTER — Other Ambulatory Visit (HOSPITAL_BASED_OUTPATIENT_CLINIC_OR_DEPARTMENT_OTHER): Payer: Medicaid Other

## 2016-09-14 ENCOUNTER — Encounter: Payer: Self-pay | Admitting: *Deleted

## 2016-09-14 ENCOUNTER — Encounter: Payer: Self-pay | Admitting: Hematology and Oncology

## 2016-09-14 ENCOUNTER — Ambulatory Visit (HOSPITAL_BASED_OUTPATIENT_CLINIC_OR_DEPARTMENT_OTHER): Payer: Medicaid Other | Admitting: Hematology and Oncology

## 2016-09-14 DIAGNOSIS — C090 Malignant neoplasm of tonsillar fossa: Secondary | ICD-10-CM | POA: Diagnosis present

## 2016-09-14 DIAGNOSIS — R634 Abnormal weight loss: Secondary | ICD-10-CM

## 2016-09-14 DIAGNOSIS — R682 Dry mouth, unspecified: Secondary | ICD-10-CM

## 2016-09-14 DIAGNOSIS — N183 Chronic kidney disease, stage 3 unspecified: Secondary | ICD-10-CM

## 2016-09-14 DIAGNOSIS — D638 Anemia in other chronic diseases classified elsewhere: Secondary | ICD-10-CM | POA: Diagnosis not present

## 2016-09-14 DIAGNOSIS — Z931 Gastrostomy status: Secondary | ICD-10-CM

## 2016-09-14 LAB — COMPREHENSIVE METABOLIC PANEL
ALBUMIN: 3.9 g/dL (ref 3.5–5.0)
ALK PHOS: 65 U/L (ref 40–150)
ALT: 11 U/L (ref 0–55)
ANION GAP: 9 meq/L (ref 3–11)
AST: 19 U/L (ref 5–34)
BUN: 26.8 mg/dL — AB (ref 7.0–26.0)
CALCIUM: 9.6 mg/dL (ref 8.4–10.4)
CHLORIDE: 100 meq/L (ref 98–109)
CO2: 26 mEq/L (ref 22–29)
Creatinine: 1.5 mg/dL — ABNORMAL HIGH (ref 0.7–1.3)
EGFR: 60 mL/min/{1.73_m2} — ABNORMAL LOW (ref >90)
Glucose: 97 mg/dl (ref 70–140)
POTASSIUM: 3.9 meq/L (ref 3.5–5.1)
Sodium: 135 mEq/L — ABNORMAL LOW (ref 136–145)
Total Bilirubin: 0.36 mg/dL (ref 0.20–1.20)
Total Protein: 7.2 g/dL (ref 6.4–8.3)

## 2016-09-14 LAB — CBC WITH DIFFERENTIAL/PLATELET
BASO%: 0.3 % (ref 0.0–2.0)
Basophils Absolute: 0 10*3/uL (ref 0.0–0.1)
EOS ABS: 0.1 10*3/uL (ref 0.0–0.5)
EOS%: 1.8 % (ref 0.0–7.0)
HCT: 27.8 % — ABNORMAL LOW (ref 38.4–49.9)
HEMOGLOBIN: 9.8 g/dL — AB (ref 13.0–17.1)
LYMPH%: 14.7 % (ref 14.0–49.0)
MCH: 34 pg — ABNORMAL HIGH (ref 27.2–33.4)
MCHC: 35.3 g/dL (ref 32.0–36.0)
MCV: 96.5 fL (ref 79.3–98.0)
MONO#: 0.8 10*3/uL (ref 0.1–0.9)
MONO%: 13.3 % (ref 0.0–14.0)
NEUT%: 69.9 % (ref 39.0–75.0)
NEUTROS ABS: 4.4 10*3/uL (ref 1.5–6.5)
PLATELETS: 176 10*3/uL (ref 140–400)
RBC: 2.88 10*6/uL — AB (ref 4.20–5.82)
RDW: 15.4 % — AB (ref 11.0–14.6)
WBC: 6.3 10*3/uL (ref 4.0–10.3)
lymph#: 0.9 10*3/uL (ref 0.9–3.3)

## 2016-09-14 LAB — TSH: TSH: 0.346 m[IU]/L (ref 0.320–4.118)

## 2016-09-14 NOTE — Assessment & Plan Note (Signed)
This is likely anemia of chronic disease. The patient denies recent history of bleeding such as epistaxis, hematuria or hematochezia. He is asymptomatic from the anemia. We will observe for now.  He does not require transfusion now.   

## 2016-09-14 NOTE — Assessment & Plan Note (Signed)
This is related to radiation treatment. Recommend increase oral hydration

## 2016-09-14 NOTE — Assessment & Plan Note (Signed)
He is recovering well from treatment  I plan to see him back again next month for further follow-up He will be due for restaging PET CT scan soon

## 2016-09-14 NOTE — Progress Notes (Signed)
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 Chronic small-vessel ischemic changes of the cerebral hemispheric white matter.       04/04/2016 Imaging    CT neck showed left tonsillar mass with diameter of 19 x 24 mm consistent with 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 necrosis. Suspicious level 5/supraclavicular nodes on the left. Scar type density in the right upper lobe is more prominent than was seen on a CT scan of the chest 10/06/2014      04/21/2016 PET scan    Asymmetric hypermetabolic soft tissue prominence in left palatine tonsil, consistent with known primary left tonsillar squamous cell Carcinoma. Left level 2 cervical lymphadenopathy, consistent with metastatic disease. No evidence of metastatic disease within the chest, abdomen, or pelvis. Stable 14 mm ground-glass and part solid nodular opacity in right lung apex compared to previous CT in 2016. This shows low-grade metabolic activity. Low-grade bronchogenic adenocarcinoma cannot be excluded. Consider surgical resection versus continued followup by CT in 12 months      05/17/2016 Procedure    Successful placement of a 20 French pull  through gastrostomy tube & right IJ approach Power [place].      05/22/2016 - 07/03/2016 Chemotherapy    He received 3 doses of high dose cisplatin with radiation       05/23/2016 - 07/10/2016 Radiation Therapy    Rec'd Helical IMRT:  Left tonsil and bilateral neck / 70 Gy in 35 fractions to gross disease, 63 Gy in 35 fractions to high risk nodal echelons, and 56 Gy in 35 fractions to intermediate risk nodal echelons.           INTERVAL HISTORY: Please see below for problem oriented charting. He returns for follow-up He is doing better He has gained weight. He complained of persistent dry mouth Denies dysphagia Denies pain  REVIEW OF SYSTEMS:   Constitutional: Denies fevers, chills or abnormal weight loss Eyes: Denies blurriness of vision Ears, nose, mouth, throat, and face: Denies mucositis or sore throat Respiratory: Denies cough, dyspnea or wheezes Cardiovascular: Denies palpitation, chest discomfort or lower extremity swelling Gastrointestinal:  Denies nausea, heartburn or change in bowel habits Skin: Denies abnormal skin rashes Lymphatics: Denies new lymphadenopathy or easy bruising Neurological:Denies numbness, tingling or new weaknesses Behavioral/Psych: Mood is stable, no new changes  All other systems were reviewed with the patient and are negative.  I have reviewed the past medical history, past surgical history, social history and family history with the patient and they are unchanged from previous note.  ALLERGIES:  has No Known Allergies.  MEDICATIONS:  Current Outpatient Prescriptions  Medication Sig Dispense Refill  . lidocaine-prilocaine (EMLA) cream Apply to affected area once 30 g 3  . Nutritional Supplements (FEEDING  SUPPLEMENT, OSMOLITE 1.5 CAL,) LIQD Begin Osmolite 1.5 via PEG one half cans 4 times a day with 120 cc free water before and after bolus feeding.  Please send formula and supplies ASAP. 6 Bottle 0  . ondansetron (ZOFRAN) 8 MG tablet Take 1  tablet (8 mg total) by mouth every 8 (eight) hours as needed. Start on the third day after chemotherapy. 60 tablet 1  . prochlorperazine (COMPAZINE) 10 MG tablet Take 1 tablet (10 mg total) by mouth every 6 (six) hours as needed (Nausea or vomiting). 90 tablet 3  . sodium fluoride (FLUORISHIELD) 1.1 % GEL dental gel Instill one drop of gel per tooth space of fluoride tray. Place over teeth for 5 minutes. Remove. Spit out excess. Repeat nightly. 120 mL prn   Current Facility-Administered Medications  Medication Dose Route Frequency Provider Last Rate Last Dose  . dexamethasone (DECADRON) injection 10 mg  10 mg Intravenous Once Heath Lark, MD      . LORazepam (ATIVAN) injection 0.5 mg  0.5 mg Intravenous Once Heath Lark, MD       Facility-Administered Medications Ordered in Other Visits  Medication Dose Route Frequency Provider Last Rate Last Dose  . sodium chloride flush (NS) 0.9 % injection 10 mL  10 mL Intracatheter PRN Heath Lark, MD   10 mL at 05/24/16 1615    PHYSICAL EXAMINATION: ECOG PERFORMANCE STATUS: 1 - Symptomatic but completely ambulatory  Vitals:   09/14/16 1445  BP: 116/67  Pulse: 100  Resp: 18  Temp: 98.8 F (37.1 C)   Filed Weights   09/14/16 1445  Weight: 147 lb (66.7 kg)    GENERAL:alert, no distress and comfortable SKIN: skin color, texture, turgor are normal, no rashes or significant lesions EYES: normal, Conjunctiva are pink and non-injected, sclera clear OROPHARYNX:no exudate, no erythema and lips, buccal mucosa, and tongue normal  NECK: supple, thyroid normal size, non-tender, without nodularity LYMPH:  no palpable lymphadenopathy in the cervical, axillary or inguinal LUNGS: clear to auscultation and percussion with normal breathing effort HEART: regular rate & rhythm and no murmurs and no lower extremity edema ABDOMEN:abdomen soft, non-tender and normal bowel sounds Musculoskeletal:no cyanosis of digits and no clubbing  NEURO: alert & oriented x 3 with  fluent speech, no focal motor/sensory deficits  LABORATORY DATA:  I have reviewed the data as listed    Component Value Date/Time   NA 135 (L) 09/14/2016 1436   K 3.9 09/14/2016 1436   CL 105 05/17/2016 1250   CO2 26 09/14/2016 1436   GLUCOSE 97 09/14/2016 1436   BUN 26.8 (H) 09/14/2016 1436   CREATININE 1.5 (H) 09/14/2016 1436   CALCIUM 9.6 09/14/2016 1436   PROT 7.2 09/14/2016 1436   ALBUMIN 3.9 09/14/2016 1436   AST 19 09/14/2016 1436   ALT 11 09/14/2016 1436   ALKPHOS 65 09/14/2016 1436   BILITOT 0.36 09/14/2016 1436   GFRNONAA >60 05/17/2016 1250   GFRAA >60 05/17/2016 1250    No results found for: SPEP, UPEP  Lab Results  Component Value Date   WBC 6.3 09/14/2016   NEUTROABS 4.4 09/14/2016   HGB 9.8 (L) 09/14/2016   HCT 27.8 (L) 09/14/2016   MCV 96.5 09/14/2016   PLT 176 09/14/2016      Chemistry      Component Value Date/Time   NA 135 (L) 09/14/2016 1436   K 3.9 09/14/2016 1436   CL 105 05/17/2016 1250   CO2 26 09/14/2016 1436   BUN 26.8 (H) 09/14/2016 1436  CREATININE 1.5 (H) 09/14/2016 1436      Component Value Date/Time   CALCIUM 9.6 09/14/2016 1436   ALKPHOS 65 09/14/2016 1436   AST 19 09/14/2016 1436   ALT 11 09/14/2016 1436   BILITOT 0.36 09/14/2016 1436       RADIOGRAPHIC STUDIES: I have personally reviewed the radiological images as listed and agreed with the findings in the report. Dg Chest 2 View  Result Date: 09/01/2016 CLINICAL DATA:  Productive cough, fever. EXAM: CHEST  2 VIEW COMPARISON:  Radiographs of April 04, 2016. FINDINGS: The heart size and mediastinal contours are within normal limits. No pneumothorax or pleural effusion is noted. Interval placement of right internal jugular Port-A-Cath with distal tip in expected position of SVC. Left lung is clear. Stable irregular density seen in apex compared to prior exam. The visualized skeletal structures are unremarkable. IMPRESSION: Stable irregular density seen in right lung apex.  Interval placement of right internal jugular Port-A-Cath. No other significant change compared to prior exam. Electronically Signed   By: Marijo Conception, M.D.   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 The patient denies recent history of bleeding such as epistaxis, hematuria or hematochezia. He is asymptomatic from the anemia. We will observe for now.  He does not require transfusion now.    Chronic kidney disease, stage III (moderate) Likely due to dehydration and recent cisplatin I continue to encourage him increase fluids as tolerated  S/P gastrostomy ([place]) He is doing well and has gained weight If he is able to gain back weight up to 155 pounds, we can discontinue feeding tube  Dry mouth This is related to radiation treatment. Recommend increase oral hydration   No orders of the defined types were placed in this encounter.  All questions were answered. The patient knows to call the clinic with any problems, questions or concerns. No barriers to learning was detected. I spent 15 minutes counseling the patient face to face. The total time spent in the appointment was 20 minutes and more than 50% was on counseling and review of test results     Heath Lark, MD 09/14/2016 5:40 PM

## 2016-09-14 NOTE — Assessment & Plan Note (Signed)
He is doing well and has gained weight If he is able to gain back weight up to 155 pounds, we can discontinue feeding tube

## 2016-09-14 NOTE — Assessment & Plan Note (Signed)
Likely due to dehydration and recent cisplatin I continue to encourage him increase fluids as tolerated 

## 2016-09-15 NOTE — Progress Notes (Signed)
Oncology Nurse Navigator Documentation  Met with patient during Established Patient appt with Dr. Alvy Bimler for scheduled follow-up.  [name] [name] completed chemo 07/03/16 and RT 07/10/16 for  L tonsil carcinoma. He reported:  Moderate energy, minimal physical activity.  He expressed interest in [facility] program, understands I will facilitate referral.  Drinking 5-6 cans Osmolite daily.  He arrives today with a 5 lb weight gain.  Not using PEG but continues to flush daily.  He voiced understanding of Dr. Calton Dach guidance PEG will be removed pending increased oral intake of nutrition and additional weight gain.  Smoking periodically.  I provided information/discussed opportunities for smoking cessation support:    QuitSmart class/individual counseling at [employer] information He did not express any needs or concerns at this time, I encouraged him to contact me if that changes, he verbalized understanding.  Gayleen Orem, RN, BSN, [place] Oncology Nurse [place] [facility] [place] [phone]

## 2016-09-17 ENCOUNTER — Telehealth: Payer: Self-pay | Admitting: Hematology and Oncology

## 2016-09-17 NOTE — Telephone Encounter (Signed)
Lvm advising appt 2/1 @ 2pm.

## 2016-09-18 ENCOUNTER — Ambulatory Visit: Payer: Medicaid Other | Attending: Radiation Oncology

## 2016-09-22 ENCOUNTER — Telehealth: Payer: Self-pay | Admitting: *Deleted

## 2016-09-22 ENCOUNTER — Ambulatory Visit: Payer: Self-pay | Admitting: Hematology and Oncology

## 2016-09-22 NOTE — Telephone Encounter (Signed)
CALLED PATIENT TO INFORM OF PET SCAN BEING MOVED FROM 09-29-16 TO 10-26-16 - ARRIVAL TIME - 10:30 AM , PT. TO BE NPO 6 HRS. PRIOR TO TEST, SPOKE WITH PATIENT AND HE IS AWARE OF THIS CHANGE AND HE IS GOOD WITH THIS CHANGE.

## 2016-09-29 ENCOUNTER — Ambulatory Visit (HOSPITAL_COMMUNITY): Payer: Medicaid Other

## 2016-10-02 ENCOUNTER — Ambulatory Visit: Payer: Self-pay

## 2016-10-02 ENCOUNTER — Ambulatory Visit: Payer: Medicaid Other

## 2016-10-02 NOTE — Therapy (Signed)
[facility] [address] [place], [place], [address] Phone: [phone]   Fax:  [phone]  Patient Details  Name: [name] [name] MRN: [id number] Date of Birth: 06/24/1956 Referring Provider:  No ref. provider found  Encounter Date: 10/02/2016  SPEECH THERAPY DISCHARGE SUMMARY  Visits from Start of Care: two  Current functional level related to goals / functional outcomes: Pt's last ST visit was in October 2017. He scheduled three follow up ST appointments that have not been attended. It is assumed he no longer desires ST.    Remaining deficits: Please see pt's last visit note on 06-19-16 for most current goals. Goals left "ongoing" at that time were not met.   Education / Equipment: HEP, late effects head/neck radiation on swallowing skills.  Plan: Patient agrees to discharge.  Patient goals were not met. Patient is being discharged due to not returning since the last visit.  ?????If referring MD would like pt seen again, please send new script. Thank you.      [facility] ,[place], [place]  10/02/2016, 4:39 PM  [facility] [address] [place], [place], [address] Phone: [phone]   Fax:  [phone]

## 2016-10-04 ENCOUNTER — Inpatient Hospital Stay (HOSPITAL_COMMUNITY)
Admission: EM | Admit: 2016-10-04 | Discharge: 2016-10-07 | DRG: 190 | Disposition: A | Discharge status: Home / Self Care | Payer: Medicaid Other | Attending: Internal Medicine | Admitting: Internal Medicine

## 2016-10-04 ENCOUNTER — Encounter (HOSPITAL_COMMUNITY): Payer: Self-pay

## 2016-10-04 ENCOUNTER — Emergency Department (HOSPITAL_COMMUNITY): Payer: Medicaid Other

## 2016-10-04 DIAGNOSIS — Z931 Gastrostomy status: Secondary | ICD-10-CM

## 2016-10-04 DIAGNOSIS — C090 Malignant neoplasm of tonsillar fossa: Secondary | ICD-10-CM | POA: Diagnosis not present

## 2016-10-04 DIAGNOSIS — N183 Chronic kidney disease, stage 3 unspecified: Secondary | ICD-10-CM | POA: Diagnosis present

## 2016-10-04 DIAGNOSIS — E43 Unspecified severe protein-calorie malnutrition: Secondary | ICD-10-CM | POA: Diagnosis present

## 2016-10-04 DIAGNOSIS — Z8249 Family history of ischemic heart disease and other diseases of the circulatory system: Secondary | ICD-10-CM

## 2016-10-04 DIAGNOSIS — Z818 Family history of other mental and behavioral disorders: Secondary | ICD-10-CM

## 2016-10-04 DIAGNOSIS — F17210 Nicotine dependence, cigarettes, uncomplicated: Secondary | ICD-10-CM | POA: Diagnosis present

## 2016-10-04 DIAGNOSIS — Z923 Personal history of irradiation: Secondary | ICD-10-CM

## 2016-10-04 DIAGNOSIS — F419 Anxiety disorder, unspecified: Secondary | ICD-10-CM | POA: Diagnosis present

## 2016-10-04 DIAGNOSIS — Z681 Body mass index (BMI) 19 or less, adult: Secondary | ICD-10-CM

## 2016-10-04 DIAGNOSIS — J189 Pneumonia, unspecified organism: Secondary | ICD-10-CM | POA: Diagnosis not present

## 2016-10-04 DIAGNOSIS — R911 Solitary pulmonary nodule: Secondary | ICD-10-CM | POA: Diagnosis present

## 2016-10-04 DIAGNOSIS — J440 Chronic obstructive pulmonary disease with acute lower respiratory infection: Principal | ICD-10-CM | POA: Diagnosis present

## 2016-10-04 DIAGNOSIS — Z8711 Personal history of peptic ulcer disease: Secondary | ICD-10-CM

## 2016-10-04 DIAGNOSIS — F1410 Cocaine abuse, uncomplicated: Secondary | ICD-10-CM | POA: Diagnosis present

## 2016-10-04 DIAGNOSIS — Z833 Family history of diabetes mellitus: Secondary | ICD-10-CM

## 2016-10-04 DIAGNOSIS — Z9221 Personal history of antineoplastic chemotherapy: Secondary | ICD-10-CM

## 2016-10-04 DIAGNOSIS — G629 Polyneuropathy, unspecified: Secondary | ICD-10-CM | POA: Diagnosis present

## 2016-10-04 DIAGNOSIS — D638 Anemia in other chronic diseases classified elsewhere: Secondary | ICD-10-CM | POA: Diagnosis present

## 2016-10-04 DIAGNOSIS — B1920 Unspecified viral hepatitis C without hepatic coma: Secondary | ICD-10-CM | POA: Diagnosis present

## 2016-10-04 DIAGNOSIS — R634 Abnormal weight loss: Secondary | ICD-10-CM | POA: Diagnosis present

## 2016-10-04 DIAGNOSIS — R918 Other nonspecific abnormal finding of lung field: Secondary | ICD-10-CM

## 2016-10-04 HISTORY — DX: Chronic kidney disease, stage 3 (moderate): N18.3

## 2016-10-04 HISTORY — DX: Chronic kidney disease, stage 3 unspecified: N18.30

## 2016-10-04 LAB — CBC WITH DIFFERENTIAL/PLATELET
BASOS ABS: 0 10*3/uL (ref 0.0–0.1)
BASOS PCT: 0 %
Eosinophils Absolute: 0.1 10*3/uL (ref 0.0–0.7)
Eosinophils Relative: 1 %
HEMATOCRIT: 30.4 % — AB (ref 39.0–52.0)
HEMOGLOBIN: 10.8 g/dL — AB (ref 13.0–17.0)
Lymphocytes Relative: 13 %
Lymphs Abs: 1 10*3/uL (ref 0.7–4.0)
MCH: 34.6 pg — ABNORMAL HIGH (ref 26.0–34.0)
MCHC: 35.5 g/dL (ref 30.0–36.0)
MCV: 97.4 fL (ref 78.0–100.0)
Monocytes Absolute: 0.8 10*3/uL (ref 0.1–1.0)
Monocytes Relative: 11 %
NEUTROS ABS: 5.5 10*3/uL (ref 1.7–7.7)
NEUTROS PCT: 75 %
Platelets: 244 10*3/uL (ref 150–400)
RBC: 3.12 MIL/uL — ABNORMAL LOW (ref 4.22–5.81)
RDW: 12.4 % (ref 11.5–15.5)
WBC: 7.3 10*3/uL (ref 4.0–10.5)

## 2016-10-04 LAB — URINALYSIS, ROUTINE W REFLEX MICROSCOPIC
BILIRUBIN URINE: NEGATIVE
Glucose, UA: NEGATIVE mg/dL
Hgb urine dipstick: NEGATIVE
Ketones, ur: NEGATIVE mg/dL
Leukocytes, UA: NEGATIVE
Nitrite: NEGATIVE
PROTEIN: NEGATIVE mg/dL
Specific Gravity, Urine: 1.004 — ABNORMAL LOW (ref 1.005–1.030)
pH: 6 (ref 5.0–8.0)

## 2016-10-04 LAB — COMPREHENSIVE METABOLIC PANEL
ALK PHOS: 68 U/L (ref 38–126)
ALT: 15 U/L — ABNORMAL LOW (ref 17–63)
ANION GAP: 10 (ref 5–15)
AST: 29 U/L (ref 15–41)
Albumin: 4.1 g/dL (ref 3.5–5.0)
BILIRUBIN TOTAL: 0.4 mg/dL (ref 0.3–1.2)
BUN: 26 mg/dL — ABNORMAL HIGH (ref 6–20)
CO2: 25 mmol/L (ref 22–32)
Calcium: 9.6 mg/dL (ref 8.9–10.3)
Chloride: 96 mmol/L — ABNORMAL LOW (ref 101–111)
Creatinine, Ser: 1.32 mg/dL — ABNORMAL HIGH (ref 0.61–1.24)
GFR, EST NON AFRICAN AMERICAN: 58 mL/min — AB (ref >60)
Glucose, Bld: 82 mg/dL (ref 65–99)
Potassium: 5 mmol/L (ref 3.5–5.1)
Sodium: 131 mmol/L — ABNORMAL LOW (ref 135–145)
TOTAL PROTEIN: 7.5 g/dL (ref 6.5–8.1)

## 2016-10-04 LAB — I-STAT TROPONIN, ED: Troponin i, poc: 0 ng/mL (ref 0.00–0.08)

## 2016-10-04 MED ORDER — VANCOMYCIN HCL 500 MG IV SOLR
500.0000 mg | Freq: Two times a day (BID) | INTRAVENOUS | Status: DC
  Administered 2016-10-05 – 2016-10-07 (×5): 500 mg via INTRAVENOUS
  Filled 2016-10-04 (×5): qty 500

## 2016-10-04 MED ORDER — DEXTROSE 5 % IV SOLN
2.0000 g | Freq: Once | INTRAVENOUS | Status: AC
  Administered 2016-10-04: 2 g via INTRAVENOUS
  Filled 2016-10-04: qty 2

## 2016-10-04 MED ORDER — SODIUM CHLORIDE 0.9 % IV BOLUS (SEPSIS)
1000.0000 mL | Freq: Once | INTRAVENOUS | Status: AC
  Administered 2016-10-04: 1000 mL via INTRAVENOUS

## 2016-10-04 MED ORDER — IOPAMIDOL (ISOVUE-370) INJECTION 76%
INTRAVENOUS | Status: AC
  Administered 2016-10-04: 100 mL
  Filled 2016-10-04: qty 100

## 2016-10-04 MED ORDER — VANCOMYCIN HCL IN DEXTROSE 750-5 MG/150ML-% IV SOLN
750.0000 mg | Freq: Once | INTRAVENOUS | Status: AC
  Administered 2016-10-04: 750 mg via INTRAVENOUS
  Filled 2016-10-04: qty 150

## 2016-10-04 MED ORDER — ACETAMINOPHEN 500 MG PO TABS
1000.0000 mg | ORAL_TABLET | Freq: Once | ORAL | Status: AC
  Administered 2016-10-04: 1000 mg via ORAL
  Filled 2016-10-04: qty 2

## 2016-10-04 NOTE — Progress Notes (Signed)
Pharmacy Antibiotic Note  [name] [name] is a 61 y.o. male admitted on 10/04/2016 with pneumonia.  Pharmacy has been consulted for vancomycin dosing. Slight renal insufficiency noted with sCr 1.32 and CrCl 57.19ml/min.  Vancomycin trough goal 15-20  Plan: 1) Vancomycin 750mg  IV x 1 then 500mg  IV q12 2) Follow up for continuation of cefepime 3) Follow renal function, cultures, LOT, level if needed  Weight: 147 lb 0.8 oz (66.7 kg)  Temp (24hrs), Avg:99.1 F (37.3 C), Min:99.1 F (37.3 C), Max:99.1 F (37.3 C)   Recent Labs Lab 10/04/16 1846  WBC 7.3  CREATININE 1.32*    Estimated Creatinine Clearance: 57.5 mL/min (by C-G formula based on SCr of 1.32 mg/dL (H)).    No Known Allergies  Antimicrobials this admission: 1/31 Vancomycin>> 1/31 Cefepime x 1  Dose adjustments this admission: n/a  Microbiology results: N/a  Thank you for allowing pharmacy to be a part of this patient's care.  Deboraha Sprang 10/04/2016 10:26 PM

## 2016-10-04 NOTE — ED Triage Notes (Signed)
Pt complaining of generalized weakness and shortness of breath. Pt states just finished chemo for mass in throat. Pt denies any cough or fevers. Pt with no focal neuro deficits at triage.

## 2016-10-04 NOTE — H&P (Addendum)
History and Physical    [name] [name] [id number] DOB: [birth year] DOA: 10/04/2016  Referring MD/NP/PA: Dr. Rex Kras PCP: Rogers Blocker, MD  Patient coming from: home  Chief Complaint: Shortness of breath  HPI: [name] [name] is a 61 y.o. 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 Symptoms lasted a few minutes or so and usually improved with resting. Associated symptoms include numbness and tingling in the toes and fingers last 1 week, weight loss of 10-15 pounds in last month, and no appetite. Patient notes that he's been drinking Osmolite to help with his issues with weight loss. Patient provides a history of snorting and/or smoking cocaine. Denies any fever, chills, chest pain, loss of consciousness, or diarrhea.  ED Course: Upon admission patient was seen to have a temp of 9.1, pulse 6597, respirations 10-21, blood pressure 103/70, and O2 saturations maintain on room air. Lab work was similar to his previous. Urinalysis negative. CT angiogram of the chest revealed multifocal pneumonia with increased size of pulmonary nodule on the right lung apex to 1.2 cm in size. Patient was initially placed on empiric antibiotics vancomycin and cefepime due to history of receiving IV chemotherapy.  Review of Systems: As per HPI otherwise 10 point review of systems negative.   Past Medical History:  Diagnosis Date  . Anxiety   . Cancer ([place]) 04/04/2016   cancer of left tonsil  . COPD (chronic obstructive pulmonary disease) ([place])   . Hepatitis   . Hepatitis C    Treatment x 8 weeks 6-7 months ago Harvoni  . History of radiation therapy 05/23/16- 07/10/16   Left Tonsil and bilateral neck  . Sinus congestion   . Stomach ulcer     Past Surgical History:  Procedure Laterality Date  . COLONOSCOPY WITH PROPOFOL N/A  04/14/2015   Procedure: COLONOSCOPY WITH PROPOFOL;  Surgeon: Arta Silence, MD;  Location: [facility];  Service: Endoscopy;  Laterality: N/A;  . HERNIA REPAIR    . IR GENERIC HISTORICAL  05/17/2016   IR FLUORO GUIDE PORT INSERTION RIGHT 05/17/2016 Jacqulynn Cadet, MD [facility]  . IR GENERIC HISTORICAL  05/17/2016   IR US GUIDE VASC ACCESS RIGHT 05/17/2016 Jacqulynn Cadet, MD [facility]  . IR GENERIC HISTORICAL  05/17/2016   IR GASTROSTOMY TUBE MOD SED 05/17/2016 Jacqulynn Cadet, MD [facility]  . MULTIPLE EXTRACTIONS WITH ALVEOLOPLASTY N/A 05/03/2016   Procedure: Multiple extractions with alveoloplasty and gross debridement of remaining teeth.;  Surgeon: Lenn Cal, DDS;  Location: [facility];  Service: Oral Surgery;  Laterality: N/A;  . STOMACH SURGERY     20 years     reports that he quit smoking about 9 months ago. His smoking use included Cigarettes. He has a 15.00 pack-year smoking history. He has never used smokeless tobacco. He reports that he does not drink alcohol or use drugs.  No Known Allergies  Family History  Problem Relation Age of Onset  . Hypertension Mother   . Anxiety disorder Mother   . Diabetes Mother   . Cancer Father     Prior to Admission medications   Medication Sig Start Date End Date Taking? Authorizing Provider  aspirin EC 81 MG tablet Take 81 mg by mouth daily.   Yes Historical Provider, MD    Physical Exam:    Constitutional: Thin appearing male in NAD, calm, comfortable  Vitals:   10/04/16 2045 10/04/16 2130 10/04/16 2145 10/04/16 2200  BP: 105/85 113/77 112/80 109/73  Pulse: 71 72 75 71  Resp: 17 14 16 16   Temp:      TempSrc:      SpO2: 100% 99% 100% 100%  Weight:    66.7 kg (147 lb 0.8 oz)   Eyes: PERRL, lids and conjunctivae normal ENMT: Mucous membranes are moist. Posterior pharynx clear of any exudate or lesions.Normal dentition.  Neck: normal, supple, no masses, no thyromegaly Respiratory: clear to auscultation  bilaterally, no wheezing, no crackles. Normal respiratory effort. No accessory muscle use.  Cardiovascular: Regular rate and rhythm, no murmurs / rubs / gallops. No extremity edema. 2+ pedal pulses. No carotid bruits.  Abdomen: no tenderness, no masses palpated. No hepatosplenomegaly. Bowel sounds positive.  Musculoskeletal: no clubbing / cyanosis. No joint deformity upper and lower extremities. Good ROM, no contractures. Normal muscle tone.  Skin: no rashes, lesions, ulcers. No induration Neurologic: CN 2-12 grossly intact. Sensation intact, DTR normal. Strength 5/5 in all 4.  Psychiatric:Alert and oriented x 3. Normal mood.     Labs on Admission: I have personally reviewed following labs and imaging studies  CBC:  Recent Labs Lab 10/04/16 1846  WBC 7.3  NEUTROABS 5.5  HGB 10.8*  HCT 30.4*  MCV 97.4  PLT [id number]   Basic Metabolic Panel:  Recent Labs Lab 10/04/16 1846  NA 131*  K 5.0  CL 96*  CO2 25  GLUCOSE 82  BUN 26*  CREATININE 1.32*  CALCIUM 9.6   GFR: Estimated Creatinine Clearance: 57.5 mL/min (by C-G formula based on SCr of 1.32 mg/dL (H)). Liver Function Tests:  Recent Labs Lab 10/04/16 1846  AST 29  ALT 15*  ALKPHOS 68  BILITOT 0.4  PROT 7.5  ALBUMIN 4.1   No results for input(s): LIPASE, AMYLASE in the last 168 hours. No results for input(s): AMMONIA in the last 168 hours. Coagulation Profile: No results for input(s): INR, PROTIME in the last 168 hours. Cardiac Enzymes: No results for input(s): CKTOTAL, CKMB, CKMBINDEX, TROPONINI in the last 168 hours. BNP (last 3 results) No results for input(s): PROBNP in the last 8760 hours. HbA1C: No results for input(s): HGBA1C in the last 72 hours. CBG: No results for input(s): GLUCAP in the last 168 hours. Lipid Profile: No results for input(s): CHOL, HDL, LDLCALC, TRIG, CHOLHDL, LDLDIRECT in the last 72 hours. Thyroid Function Tests: No results for input(s): TSH, T4TOTAL, FREET4, T3FREE, THYROIDAB in  the last 72 hours. Anemia Panel: No results for input(s): VITAMINB12, FOLATE, FERRITIN, TIBC, IRON, RETICCTPCT in the last 72 hours. Urine analysis:    Component Value Date/Time   COLORURINE STRAW (A) 10/04/2016 1935   APPEARANCEUR CLEAR 10/04/2016 1935   LABSPEC 1.004 (L) 10/04/2016 1935   LABSPEC 1.015 06/26/2016 1129   PHURINE 6.0 10/04/2016 1935   GLUCOSEU NEGATIVE 10/04/2016 1935   GLUCOSEU Negative 06/26/2016 1129   HGBUR NEGATIVE 10/04/2016 1935   BILIRUBINUR NEGATIVE 10/04/2016 1935   BILIRUBINUR Negative 06/26/2016 [place] 10/04/2016 1935   PROTEINUR NEGATIVE 10/04/2016 1935   UROBILINOGEN 0.2 06/26/2016 1129   NITRITE NEGATIVE 10/04/2016 1935   LEUKOCYTESUR NEGATIVE 10/04/2016 1935   LEUKOCYTESUR Negative 06/26/2016 1129   Sepsis Labs: No results found for this or any previous visit (from the past 240 hour(s)).   Radiological Exams on Admission: Dg Chest 2 View  Result Date: 10/04/2016 CLINICAL DATA:  Acute onset of shortness of breath. Initial encounter. EXAM: CHEST  2 VIEW COMPARISON:  Chest radiograph performed 09/01/2016 FINDINGS: The lungs are well-aerated. Peribronchial thickening is noted. Rounded left apical opacities are of uncertain significance but could reflect mild infection. There is no evidence of pleural effusion or pneumothorax. The heart is normal in size; the mediastinal contour is within normal limits. No acute osseous abnormalities are seen. The right-sided chest port is noted ending about the mid SVC. IMPRESSION: New rounded left apical airspace opacities, of uncertain significance. These could reflect pneumonia, depending on the patient's symptoms. If the patient does not have clinical signs of pneumonia, CT of the chest could be considered for further evaluation. Otherwise, followup PA and lateral chest X-ray is recommended in 3-4 weeks following trial of antibiotic therapy to ensure resolution and exclude underlying metastatic disease.  Electronically Signed   By: Garald Balding M.D.   On: 10/04/2016 20:08   Ct Angio Chest Pe W/cm &/or Wo Cm  Result Date: 10/04/2016 CLINICAL DATA:  Acute onset of generalized weakness and shortness of breath. Current history of tonsillar cancer, on chemotherapy. Initial encounter. EXAM: CT ANGIOGRAPHY CHEST WITH CONTRAST TECHNIQUE: Multidetector CT imaging of the chest was performed using the standard protocol during bolus administration of intravenous contrast. Multiplanar CT image reconstructions and MIPs were obtained to evaluate the vascular anatomy. CONTRAST:  100 mL of Isovue 370 IV contrast COMPARISON:  Chest radiograph performed earlier today at 7:26 p.m., and PET/CT performed 04/21/2016 FINDINGS: Cardiovascular:  There is no evidence of pulmonary embolus. The heart is borderline normal in size. A right chest port is seen ending about the distal SVC. The great vessels are grossly unremarkable. No calcific atherosclerotic disease is seen. Mediastinum/Nodes: No mediastinal lymphadenopathy is seen. No pericardial effusion is identified. The visualized portions of thyroid gland are unremarkable. No axillary lymphadenopathy is appreciated. Lungs/Pleura: Patchy bilateral airspace opacities are noted, at the upper lobes, compatible with multifocal pneumonia. The previously noted ground-glass and solid nodule at the right lung apex demonstrates increased solid components. The solid component measures 1.2 cm. 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 No pleural effusion or pneumothorax is seen. Upper Abdomen: The visualized portions of the liver and spleen are grossly unremarkable. The visualized portions of the pancreas, adrenal glands and kidneys are within normal limits. Musculoskeletal: No acute osseous abnormalities are identified. The visualized musculature is unremarkable in appearance. Review of the MIP images confirms the above findings. IMPRESSION:  1. No evidence of pulmonary embolus. 2. Patchy bilateral airspace opacities, at the upper lobes, compatible with multifocal pneumonia. This corresponds to the finding on recent chest radiograph. 3. Previously noted ground-glass and solid nodule at the right lung apex demonstrates increased solid components. The solid component now measures 1.2 cm. 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 Tissue diagnosis would be helpful, as deemed clinically appropriate. Electronically Signed   By: Garald Balding M.D.   On: 10/04/2016 21:48    EKG: Independently reviewed. Sinus tachycardia 103 bpm  Assessment/Plan Multifocal pneumonia: Acute. Patient with intermittent reports of shortness of breath. Initially concern for pulmonary emboli. CT angiogram of the chest did not show any acute pulmonary embolus but a multifocal pneumonia and new nodule. - Admit to telemetry bed - PNA order set initiated  - Check blood and sputum cultures -  Antibiotics of vancomycin and cefepime were started due to patient's history of receiving chemotherapy. - Likely can de-escalate to abx  to community-acquired pneumonia coverage in a.m.  Tonsillar cancer - May  want to notify Dr. Alvy Bimler in a.m. a new CT findings  Pulmonary nodule: Acute enlargement of previously seen nodule on CT angiogram of the chest artery 1.2 cm in size concerning for forbroncogenic adenocarcinoma - As seen above  Unintentional Weight loss:  Patient reports weight loss of 15-20 pounds over last month or so.  Anemia:  hemoglobin 10.8 on admission. - continue to monitor  Chronic kidney disease stage III: Stable. Patient's baseline kidney function between 1.4 1.9. - Continue to monitor  History of cocaine use - Check UDS   DVT prophylaxis: Lovenox   Code Status: Full  Family Communication: No family present at bedside.   Disposition Plan: Likely discharge home once medically stable Consults  called: none Admission status: Observation  Norval Morton MD [facility] Pager [phone]  If 7PM-7AM, please contact night-coverage [email or web address] Password [facility]  10/04/2016, 10:41 PM

## 2016-10-04 NOTE — ED Provider Notes (Signed)
[facility] DEPT Provider Note   CSN: [id number] Arrival date & time: 10/04/16 1615     History    Chief Complaint  Patient presents with  . Weakness  . Fatigue  . Chemotherapy     HPI [name] [name] is a 61 y.o. male.  61yo M w/ PMH including tonsillar CA on chemo, COPD, hep C Who presents with generalized weakness and shortness of breath. Patient states that he has had generalized weakness recently but it seemed to get a lot worse today. He denies any focal weakness or unilateral weakness. He has had 1 week of progressively worsening tingling/numbness and fingers and toes bilaterally. Today in the setting of feeling generally weak, he had an episode of feeling short of breath. It lasted for "a while" but is currently resolved. He denies any associated chest pain, nausea, vomiting, diaphoresis, cough/cold symptoms, fevers, or recent illness. He also had a gradual onset of headache this afternoon that is now severe. No visual changes. He has had a headache like this a long time ago. He has had a decreased appetite for a while. No sick contacts or recent travel. No leg swelling/pain or history of blood clots. He does admit to cocaine use, last used 2 days ago. He denies any use today.  Past Medical History:  Diagnosis Date  . Anxiety   . Cancer ([place]) 04/04/2016   cancer of left tonsil  . COPD (chronic obstructive pulmonary disease) ([place])   . Hepatitis   . Hepatitis C    Treatment x 8 weeks 6-7 months ago Harvoni  . History of radiation therapy 05/23/16- 07/10/16   Left Tonsil and bilateral neck  . Sinus congestion   . Stomach ulcer      Patient Active Problem List   Diagnosis Date Noted  . Multifocal pneumonia 10/04/2016  . Chronic kidney disease, stage III (moderate) 09/14/2016  . Dry mouth 09/14/2016  . Dysgeusia 09/05/2016  . Dehydration 09/01/2016  . Bronchitis 09/01/2016  . Diarrhea 09/01/2016  . Anemia due to chronic illness 08/24/2016  . Acute prerenal failure  ([place]) 07/24/2016  . Hemoptysis 07/24/2016  . Pancytopenia, acquired ([place]) 07/04/2016  . Radiation dermatitis 07/04/2016  . Protein-calorie malnutrition, moderate ([place]) 06/27/2016  . Atypical chest pain 06/27/2016  . Bilateral tinnitus 05/30/2016  . Chemotherapy-induced nausea 05/30/2016  . Constipation, acute 05/30/2016  . Unintentional weight loss 05/30/2016  . Hypersensitivity reaction 05/23/2016  . S/P gastrostomy ([place]) 05/18/2016  . Cancer associated pain 05/18/2016  . Lesion of right lung 04/26/2016  . 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[place]) 04/24/2016  . History of hepatitis C 04/24/2016  . Chest pain 09/17/2014  . Anxiety   . Dyspnea 03/25/2014  . COPD GOLD II 07/25/2013  . Sinusitis, chronic 07/25/2013    Past Surgical History:  Procedure Laterality Date  . COLONOSCOPY WITH PROPOFOL N/A 04/14/2015   Procedure: COLONOSCOPY WITH PROPOFOL;  Surgeon: Arta Silence, MD;  [facility];  Service: Endoscopy;  Laterality: N/A;  . HERNIA REPAIR    . IR GENERIC HISTORICAL  05/17/2016   IR FLUORO GUIDE PORT INSERTION RIGHT 05/17/2016 Jacqulynn Cadet, MD [facility]  . IR GENERIC HISTORICAL  05/17/2016   IR US GUIDE VASC ACCESS RIGHT 05/17/2016 Jacqulynn Cadet, MD [facility]  . IR GENERIC HISTORICAL  05/17/2016   IR GASTROSTOMY TUBE MOD SED 05/17/2016 Jacqulynn Cadet, MD [facility]  . MULTIPLE EXTRACTIONS WITH ALVEOLOPLASTY N/A 05/03/2016   Procedure: Multiple extractions with alveoloplasty and gross debridement of remaining teeth.;  Surgeon: Lenn Cal, DDS;  Location: [facility];  Service: Oral Surgery;  Laterality: N/A;  . STOMACH SURGERY     20 years        Home Medications    Prior to Admission medications   Medication Sig Start Date End Date Taking? Authorizing Provider  aspirin EC 81 MG tablet Take 81 mg by mouth daily.   Yes Historical Provider, MD      Family History  Problem Relation Age of Onset  . Hypertension Mother   . Anxiety  disorder Mother   . Diabetes Mother   . Cancer Father      Social History  Substance Use Topics  . Smoking status: Former Smoker    Packs/day: 0.50    Years: 30.00    Types: Cigarettes    Quit date: 01/03/2016  . Smokeless tobacco: Never Used  . Alcohol use No     Comment: quit about 3 months ago, drank beer (he tells me a lot of beer)     Allergies     Patient has no known allergies.    Review of Systems  10 Systems reviewed and are negative for acute change except as noted in the HPI.   Physical Exam Updated Vital Signs BP 109/73   Pulse 71   Temp 99.1 F (37.3 C) (Oral)   Resp 16   Wt 147 lb 0.8 oz (66.7 kg)   SpO2 100%   BMI 19.40 kg/m   Physical Exam  Constitutional: He is oriented to person, place, and time. He appears well-developed and well-nourished. No distress.  HENT:  Head: Normocephalic and atraumatic.  Moist mucous membranes  Eyes: Conjunctivae are normal. Pupils are equal, round, and reactive to light.  Neck: Neck supple.  Cardiovascular: Normal rate, regular rhythm and normal heart sounds.   No murmur heard. Pulmonary/Chest: Effort normal and breath sounds normal.  Abdominal: Soft. Bowel sounds are normal. He exhibits no distension. There is no tenderness.  Musculoskeletal: He exhibits no edema.  Neurological: He is alert and oriented to person, place, and time. He has normal strength and normal reflexes. No cranial nerve deficit. He displays a negative Romberg sign.  Fluent speech  Skin: Skin is warm and dry.  Psychiatric: He has a normal mood and affect. Judgment normal.  Nursing note and vitals reviewed.     ED Treatments / Results  Labs (all labs ordered are listed, but only abnormal results are displayed) Labs Reviewed  COMPREHENSIVE METABOLIC PANEL - Abnormal; Notable for the following:       Result Value   Sodium 131 (*)    Chloride 96 (*)    BUN 26 (*)    Creatinine, Ser 1.32 (*)    ALT 15 (*)    GFR calc non Af Amer 58  (*)    All other components within normal limits  CBC WITH DIFFERENTIAL/PLATELET - Abnormal; Notable for the following:    RBC 3.12 (*)    Hemoglobin 10.8 (*)    HCT 30.4 (*)    MCH 34.6 (*)    All other components within normal limits  URINALYSIS, ROUTINE W REFLEX MICROSCOPIC - Abnormal; Notable for the following:    Color, Urine STRAW (*)    Specific Gravity, Urine 1.004 (*)    All other components within normal limits  I-STAT TROPOININ, ED     EKG  EKG Interpretation  Date/Time:  Wednesday October 04 2016 16:23:41 EST Ventricular Rate:  103 PR Interval:  134 QRS  Duration: 84 QT Interval:  318 QTC Calculation: 416 R Axis:   91 Text Interpretation:  Sinus tachycardia Biatrial enlargement Rightward axis Pulmonary disease pattern Abnormal ECG tachycardia and atrial enlargement new from previous, new T wave inversion aVL Confirmed by Renaye Janicki MD, Tully Burgo [phone]) on 10/04/2016 7:04:50 PM         Radiology Dg Chest 2 View  Result Date: 10/04/2016 CLINICAL DATA:  Acute onset of shortness of breath. Initial encounter. EXAM: CHEST  2 VIEW COMPARISON:  Chest radiograph performed 09/01/2016 FINDINGS: The lungs are well-aerated. Peribronchial thickening is noted. Rounded left apical opacities are of uncertain significance but could reflect mild infection. There is no evidence of pleural effusion or pneumothorax. The heart is normal in size; the mediastinal contour is within normal limits. No acute osseous abnormalities are seen. The right-sided chest port is noted ending about the mid SVC. IMPRESSION: New rounded left apical airspace opacities, of uncertain significance. These could reflect pneumonia, depending on the patient's symptoms. If the patient does not have clinical signs of pneumonia, CT of the chest could be considered for further evaluation. Otherwise, followup PA and lateral chest X-ray is recommended in 3-4 weeks following trial of antibiotic therapy to ensure resolution and  exclude underlying metastatic disease. Electronically Signed   By: Garald Balding M.D.   On: 10/04/2016 20:08   Ct Angio Chest Pe W/cm &/or Wo Cm  Result Date: 10/04/2016 CLINICAL DATA:  Acute onset of generalized weakness and shortness of breath. Current history of tonsillar cancer, on chemotherapy. Initial encounter. EXAM: CT ANGIOGRAPHY CHEST WITH CONTRAST TECHNIQUE: Multidetector CT imaging of the chest was performed using the standard protocol during bolus administration of intravenous contrast. Multiplanar CT image reconstructions and MIPs were obtained to evaluate the vascular anatomy. CONTRAST:  100 mL of Isovue 370 IV contrast COMPARISON:  Chest radiograph performed earlier today at 7:26 p.m., and PET/CT performed 04/21/2016 FINDINGS: Cardiovascular:  There is no evidence of pulmonary embolus. The heart is borderline normal in size. A right chest port is seen ending about the distal SVC. The great vessels are grossly unremarkable. No calcific atherosclerotic disease is seen. Mediastinum/Nodes: No mediastinal lymphadenopathy is seen. No pericardial effusion is identified. The visualized portions of thyroid gland are unremarkable. No axillary lymphadenopathy is appreciated. Lungs/Pleura: Patchy bilateral airspace opacities are noted, at the upper lobes, compatible with multifocal pneumonia. The previously noted ground-glass and solid nodule at the right lung apex demonstrates increased solid components. The solid component measures 1.2 cm. 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 No pleural effusion or pneumothorax is seen. Upper Abdomen: The visualized portions of the liver and spleen are grossly unremarkable. The visualized portions of the pancreas, adrenal glands and kidneys are within normal limits. Musculoskeletal: No acute osseous abnormalities are identified. The visualized musculature is unremarkable in appearance. Review of the MIP images  confirms the above findings. IMPRESSION: 1. No evidence of pulmonary embolus. 2. Patchy bilateral airspace opacities, at the upper lobes, compatible with multifocal pneumonia. This corresponds to the finding on recent chest radiograph. 3. Previously noted ground-glass and solid nodule at the right lung apex demonstrates increased solid components. The solid component now measures 1.2 cm. 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 Tissue diagnosis would be helpful, as deemed clinically appropriate. Electronically Signed   By: Garald Balding M.D.   On: 10/04/2016 21:48    Procedures Procedures (including critical care time) Procedures  Medications Ordered in ED  Medications  vancomycin (VANCOCIN) IVPB 750 mg/150 ml premix (not administered)  vancomycin (VANCOCIN) 500 mg in sodium chloride 0.9 % 100 mL IVPB (not administered)  sodium chloride 0.9 % bolus 1,000 mL (0 mLs Intravenous Stopped 10/04/16 2110)  acetaminophen (TYLENOL) tablet 1,000 mg (1,000 mg Oral Given 10/04/16 1945)  iopamidol (ISOVUE-370) 76 % injection (100 mLs  Contrast Given 10/04/16 2114)  ceFEPIme (MAXIPIME) 2 g in dextrose 5 % 50 mL IVPB (2 g Intravenous New Bag/Given 10/04/16 2237)     Initial Impression / Assessment and Plan / ED Course  I have reviewed the triage vital signs and the nursing notes.  Pertinent labs & imaging results that were available during my care of the patient were reviewed by me and considered in my medical decision making (see chart for details).      Pt w/ tonsillar cancer on chemo who p/w Weakness and shortness of breath episode today. He was non-toxic on exam w/ reassuring VS, T 99.1. No respiratory distress. Labs show Cr 1.32, WBC 7.3, Hgb 10.8. Chest x-ray shows left apical opacities, unclear whether pneumonia or mass. Obtained CTA for better evaluation as well as rule out a PE.  CTA negative for PE but does show multifocal pneumonia. CT also  notes right lung mass concerning for bronchogenic adenocarcinoma. I informed the patient of CT findings. Gave broad-spectrum antibiotics given his current immunosuppression with chemotherapy. Discussed observation admission with Dr. Tamala Julian, [facility], and patient admitted for further care. Final Clinical Impressions(s) / ED Diagnoses   Final diagnoses:  Multifocal pneumonia  Lung mass     New Prescriptions   No medications on file       Sharlett Iles, MD 10/04/16 2317

## 2016-10-05 ENCOUNTER — Other Ambulatory Visit: Payer: Self-pay

## 2016-10-05 ENCOUNTER — Encounter (HOSPITAL_COMMUNITY): Payer: Self-pay | Admitting: General Practice

## 2016-10-05 ENCOUNTER — Ambulatory Visit: Payer: Self-pay | Admitting: Hematology and Oncology

## 2016-10-05 DIAGNOSIS — J189 Pneumonia, unspecified organism: Secondary | ICD-10-CM | POA: Diagnosis not present

## 2016-10-05 DIAGNOSIS — D638 Anemia in other chronic diseases classified elsewhere: Secondary | ICD-10-CM | POA: Diagnosis present

## 2016-10-05 DIAGNOSIS — Z8711 Personal history of peptic ulcer disease: Secondary | ICD-10-CM | POA: Diagnosis not present

## 2016-10-05 DIAGNOSIS — Z923 Personal history of irradiation: Secondary | ICD-10-CM | POA: Diagnosis not present

## 2016-10-05 DIAGNOSIS — G629 Polyneuropathy, unspecified: Secondary | ICD-10-CM | POA: Diagnosis present

## 2016-10-05 DIAGNOSIS — Z833 Family history of diabetes mellitus: Secondary | ICD-10-CM | POA: Diagnosis not present

## 2016-10-05 DIAGNOSIS — F419 Anxiety disorder, unspecified: Secondary | ICD-10-CM | POA: Diagnosis present

## 2016-10-05 DIAGNOSIS — Z9221 Personal history of antineoplastic chemotherapy: Secondary | ICD-10-CM | POA: Diagnosis not present

## 2016-10-05 DIAGNOSIS — J440 Chronic obstructive pulmonary disease with acute lower respiratory infection: Secondary | ICD-10-CM | POA: Diagnosis present

## 2016-10-05 DIAGNOSIS — R911 Solitary pulmonary nodule: Secondary | ICD-10-CM | POA: Diagnosis not present

## 2016-10-05 DIAGNOSIS — F17210 Nicotine dependence, cigarettes, uncomplicated: Secondary | ICD-10-CM | POA: Diagnosis present

## 2016-10-05 DIAGNOSIS — F17200 Nicotine dependence, unspecified, uncomplicated: Secondary | ICD-10-CM | POA: Diagnosis not present

## 2016-10-05 DIAGNOSIS — N183 Chronic kidney disease, stage 3 (moderate): Secondary | ICD-10-CM | POA: Diagnosis present

## 2016-10-05 DIAGNOSIS — Z681 Body mass index (BMI) 19 or less, adult: Secondary | ICD-10-CM | POA: Diagnosis not present

## 2016-10-05 DIAGNOSIS — B1920 Unspecified viral hepatitis C without hepatic coma: Secondary | ICD-10-CM | POA: Diagnosis present

## 2016-10-05 DIAGNOSIS — C090 Malignant neoplasm of tonsillar fossa: Secondary | ICD-10-CM | POA: Diagnosis present

## 2016-10-05 DIAGNOSIS — Z8249 Family history of ischemic heart disease and other diseases of the circulatory system: Secondary | ICD-10-CM | POA: Diagnosis not present

## 2016-10-05 DIAGNOSIS — Z931 Gastrostomy status: Secondary | ICD-10-CM | POA: Diagnosis not present

## 2016-10-05 DIAGNOSIS — R0602 Shortness of breath: Secondary | ICD-10-CM | POA: Diagnosis present

## 2016-10-05 DIAGNOSIS — R918 Other nonspecific abnormal finding of lung field: Secondary | ICD-10-CM | POA: Diagnosis not present

## 2016-10-05 DIAGNOSIS — Z818 Family history of other mental and behavioral disorders: Secondary | ICD-10-CM | POA: Diagnosis not present

## 2016-10-05 DIAGNOSIS — E43 Unspecified severe protein-calorie malnutrition: Secondary | ICD-10-CM | POA: Diagnosis present

## 2016-10-05 DIAGNOSIS — F1410 Cocaine abuse, uncomplicated: Secondary | ICD-10-CM | POA: Diagnosis present

## 2016-10-05 LAB — RESPIRATORY PANEL BY PCR
Adenovirus: NOT DETECTED
Bordetella pertussis: NOT DETECTED
CORONAVIRUS OC43-RVPPCR: NOT DETECTED
Chlamydophila pneumoniae: NOT DETECTED
Coronavirus 229E: NOT DETECTED
Coronavirus HKU1: NOT DETECTED
Coronavirus NL63: NOT DETECTED
INFLUENZA B-RVPPCR: NOT DETECTED
Influenza A: NOT DETECTED
METAPNEUMOVIRUS-RVPPCR: NOT DETECTED
Mycoplasma pneumoniae: NOT DETECTED
PARAINFLUENZA VIRUS 1-RVPPCR: NOT DETECTED
PARAINFLUENZA VIRUS 2-RVPPCR: NOT DETECTED
PARAINFLUENZA VIRUS 3-RVPPCR: NOT DETECTED
Parainfluenza Virus 4: NOT DETECTED
RESPIRATORY SYNCYTIAL VIRUS-RVPPCR: NOT DETECTED
RHINOVIRUS / ENTEROVIRUS - RVPPCR: NOT DETECTED

## 2016-10-05 LAB — BASIC METABOLIC PANEL
ANION GAP: 11 (ref 5–15)
BUN: 26 mg/dL — AB (ref 6–20)
CHLORIDE: 99 mmol/L — AB (ref 101–111)
CO2: 23 mmol/L (ref 22–32)
Calcium: 9 mg/dL (ref 8.9–10.3)
Creatinine, Ser: 1.35 mg/dL — ABNORMAL HIGH (ref 0.61–1.24)
GFR calc Af Amer: >60 mL/min (ref >60)
GFR calc non Af Amer: 56 mL/min — ABNORMAL LOW (ref >60)
Glucose, Bld: 84 mg/dL (ref 65–99)
POTASSIUM: 4.5 mmol/L (ref 3.5–5.1)
SODIUM: 133 mmol/L — AB (ref 135–145)

## 2016-10-05 LAB — CBC
HCT: 28.2 % — ABNORMAL LOW (ref 39.0–52.0)
Hemoglobin: 10 g/dL — ABNORMAL LOW (ref 13.0–17.0)
MCH: 34.7 pg — ABNORMAL HIGH (ref 26.0–34.0)
MCHC: 35.5 g/dL (ref 30.0–36.0)
MCV: 97.9 fL (ref 78.0–100.0)
PLATELETS: 197 10*3/uL (ref 150–400)
RBC: 2.88 MIL/uL — ABNORMAL LOW (ref 4.22–5.81)
RDW: 12.5 % (ref 11.5–15.5)
WBC: 5 10*3/uL (ref 4.0–10.5)

## 2016-10-05 LAB — STREP PNEUMONIAE URINARY ANTIGEN: Strep Pneumo Urinary Antigen: NEGATIVE

## 2016-10-05 LAB — RAPID URINE DRUG SCREEN, HOSP PERFORMED
Amphetamines: NOT DETECTED
BARBITURATES: NOT DETECTED
BENZODIAZEPINES: NOT DETECTED
Cocaine: POSITIVE — AB
Opiates: NOT DETECTED
Tetrahydrocannabinol: NOT DETECTED

## 2016-10-05 MED ORDER — ENSURE ENLIVE PO LIQD
237.0000 mL | Freq: Every day | ORAL | Status: DC
  Administered 2016-10-05 – 2016-10-07 (×9): 237 mL via ORAL

## 2016-10-05 MED ORDER — ASPIRIN EC 81 MG PO TBEC
81.0000 mg | DELAYED_RELEASE_TABLET | Freq: Every day | ORAL | Status: DC
Start: 2016-10-05 — End: 2016-10-07
  Administered 2016-10-05 – 2016-10-07 (×3): 81 mg via ORAL
  Filled 2016-10-05 (×3): qty 1

## 2016-10-05 MED ORDER — PNEUMOCOCCAL VAC POLYVALENT 25 MCG/0.5ML IJ INJ
0.5000 mL | INJECTION | INTRAMUSCULAR | Status: AC
  Administered 2016-10-06: 0.5 mL via INTRAMUSCULAR
  Filled 2016-10-05: qty 0.5

## 2016-10-05 MED ORDER — SODIUM CHLORIDE 0.9 % IV SOLN
INTRAVENOUS | Status: DC
  Administered 2016-10-05 (×2): via INTRAVENOUS

## 2016-10-05 MED ORDER — DEXTROSE 5 % IV SOLN
1.0000 g | Freq: Three times a day (TID) | INTRAVENOUS | Status: DC
  Administered 2016-10-05 – 2016-10-07 (×7): 1 g via INTRAVENOUS
  Filled 2016-10-05 (×8): qty 1

## 2016-10-05 MED ORDER — ENOXAPARIN SODIUM 40 MG/0.4ML ~~LOC~~ SOLN
40.0000 mg | SUBCUTANEOUS | Status: DC
  Administered 2016-10-05 – 2016-10-07 (×3): 40 mg via SUBCUTANEOUS
  Filled 2016-10-05 (×3): qty 0.4

## 2016-10-05 NOTE — Progress Notes (Signed)
Nutrition Follow-up  DOCUMENTATION CODES:   Severe malnutrition in context of chronic illness  INTERVENTION:   Encouraged pt to continue home regimen once d/c'ed. Monitor weight and call RD with any further weight loss.   Ensure Enlive po six times per day, each supplement provides 350 kcal and 20 grams of protein (2100 kcal and 120 grams protein)   NUTRITION DIAGNOSIS:   Malnutrition (Severe) related to chronic illness (cancer) as evidenced by severe depletion of body fat, severe depletion of muscle mass.  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    Per pt he is not able to eat very much. He starts to chew and just loses all appetite. He is able to drink and has been drinking 6 cans of Osmolite 1.5 per day instead of putting them through his tube. He feels that he can get them in faster by drinking them instead of putting them through his tube. He likes to always have something on his stomach. Ensure Enlive at bedside, he is willing to consume these here during admission.  Home TF regimen provides: 2130 kcal and 89 grams protein Pt reports that he has lost more weight recently. He was up to 147 lb but is now back down to 144 lb this admission. 2% weight loss x 1 month. Suspect acute illness is cause for recent weight loss.   Meal Completion: 50% at lunch Na 133 Usual body weight prior to illness was 157 lb   Diet Order:  Diet Heart Room service appropriate? Yes; Fluid consistency: Thin Diet Heart Room service appropriate? Yes; Fluid consistency:  Thin  Skin:  Reviewed, no issues  Last BM:  1/30  Height:   Ht Readings from Last 1 Encounters:  10/05/16 6\' 1"  (1.854 m)    Weight:   Wt Readings from Last 1 Encounters:  10/05/16 144 lb 12.8 oz (65.7 kg)    Ideal Body Weight:  83.6 kg  BMI:  Body mass index is 19.1 kg/m.  Estimated Nutritional Needs:   Kcal:  2200-2400  Protein:  100-110 grams  Fluid:  > 2.2 L/day  EDUCATION NEEDS:   Education needs addressed  Maylon Peppers RD, [address], [place] Pager [phone] After Hours Pager

## 2016-10-05 NOTE — Progress Notes (Addendum)
PROGRESS NOTE        PATIENT DETAILS Name: [name] [name] Age: 61 y.o. Sex: male Date of Birth: 05-Dec-1955 Admit Date: 10/04/2016 Admitting Physician Norval Morton, MD [id number] L Dean, MD  Brief Narrative: Patient is a 61 y.o. 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 CT angiogram of the chest negative for pulmonary embolism, but found to have multifocal pneumonia. See below for further details  Subjective: Feels better  Assessment/Plan: Multifocal pneumonia: Continue empiric antimicrobial therapy. Await culture data. He is afebrile and does not appear toxic.  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 Probably better to repeat imaging in a few weeks after patient completes her course of antimicrobial therapy. Oncology following.  Anemia: Probably related to underlying chronic disease, hemoglobin close to usual baseline  History of PEG tube placement: PEG tube remains in place-have consulted nutrition services  Chronic kidney disease stage III: Creatinine close to usual baseline, follow  DVT Prophylaxis: Prophylactic Lovenox   Code Status: Full code   Family Communication: None at bedside  Disposition Plan: Remain inpatient-home in 1-2 days  Antimicrobial agents: Anti-infectives    Start     Dose/Rate Route Frequency Ordered Stop   10/05/16 1100  vancomycin (VANCOCIN) 500 mg in sodium chloride 0.9 % 100 mL IVPB     500 mg 100 mL/hr over 60 Minutes Intravenous Every 12 hours 10/04/16 2225 10/13/16 1059   10/05/16 0600  ceFEPIme (MAXIPIME) 1 g in dextrose 5 % 50 mL IVPB     1 g 100 mL/hr over 30 Minutes Intravenous Every 8 hours 10/05/16 0040 10/13/16 0559     10/04/16 2230  vancomycin (VANCOCIN) IVPB 750 mg/150 ml premix     750 mg 150 mL/hr over 60 Minutes Intravenous  Once 10/04/16 2225 10/05/16 0018   10/04/16 2215  ceFEPIme (MAXIPIME) 2 g in dextrose 5 % 50 mL IVPB     2 g 100 mL/hr over 30 Minutes Intravenous  Once 10/04/16 2214 10/04/16 2318     Procedures: None  CONSULTS:  None  Time spent: 25- minutes-Greater than 50% of this time was spent in counseling, explanation of diagnosis, planning of further management, and coordination of care.  MEDICATIONS: Scheduled Meds: . aspirin EC  81 mg Oral Daily  . ceFEPime (MAXIPIME) IV  1 g Intravenous Q8H  . enoxaparin (LOVENOX) injection  40 mg Subcutaneous Q24H  . [START ON 10/06/2016] pneumococcal 23 valent vaccine  0.5 mL Intramuscular Tomorrow-1000  . vancomycin  500 mg Intravenous Q12H   Continuous Infusions: . sodium chloride 100 mL/hr at 10/05/16 1316   PRN Meds:.   PHYSICAL EXAM: Vital signs: Vitals:   10/04/16 2345 10/05/16 0000 10/05/16 0044 10/05/16 0610  BP: 110/82 103/70 102/75 109/77  Pulse: 67 67 77 61  Resp: 16 13 16 16   Temp:   98.4 F (36.9 C) 98.4 F (36.9 C)  TempSrc:   Oral Oral  SpO2: 99% 100% 100% 100%  Weight:   65.7 kg (144 lb 12.8 oz)   Height:   6\' 1"  (1.854 m)    Filed Weights   10/04/16 2200 10/05/16 0044  Weight:  66.7 kg (147 lb 0.8 oz) 65.7 kg (144 lb 12.8 oz)   Body mass index is 19.1 kg/m.   General appearance :Awake, alert, not in any distress. Speech Clear. Not toxic Looking Eyes:, pupils equally reactive to light and accomodation,no scleral icterus.Pink conjunctiva HEENT: Atraumatic and Normocephalic Neck: supple, no JVD. No cervical lymphadenopathy. No thyromegaly Resp:Good air entry bilaterally, no added sounds  CVS: S1 S2 regular, no murmurs.  GI: Bowel sounds present, Non tender and not distended with no gaurding, rigidity or rebound.No organomegaly.Peg tube in place Extremities: B/L Lower Ext shows no edema, both legs are  warm to touch Neurology:  speech clear,Non focal, sensation is grossly intact. Psychiatric: Normal judgment and insight. Alert and oriented x 3. Normal mood. Musculoskeletal:No digital cyanosis Skin:No Rash, warm and dry Wounds:N/A  I have personally reviewed following labs and imaging studies  LABORATORY DATA: CBC:  Recent Labs Lab 10/04/16 1846 10/05/16 0110  WBC 7.3 5.0  NEUTROABS 5.5  --   HGB 10.8* 10.0*  HCT 30.4* 28.2*  MCV 97.4 97.9  PLT 244 [id number]    Basic Metabolic Panel:  Recent Labs Lab 10/04/16 1846 10/05/16 0110  NA 131* 133*  K 5.0 4.5  CL 96* 99*  CO2 25 23  GLUCOSE 82 84  BUN 26* 26*  CREATININE 1.32* 1.35*  CALCIUM 9.6 9.0    GFR: Estimated Creatinine Clearance: 55.4 mL/min (by C-G formula based on SCr of 1.35 mg/dL (H)).  Liver Function Tests:  Recent Labs Lab 10/04/16 1846  AST 29  ALT 15*  ALKPHOS 68  BILITOT 0.4  PROT 7.5  ALBUMIN 4.1   No results for input(s): LIPASE, AMYLASE in the last 168 hours. No results for input(s): AMMONIA in the last 168 hours.  Coagulation Profile: No results for input(s): INR, PROTIME in the last 168 hours.  Cardiac Enzymes: No results for input(s): CKTOTAL, CKMB, CKMBINDEX, TROPONINI in the last 168 hours.  BNP (last 3 results) No results for input(s): PROBNP in the last 8760 hours.  HbA1C: No results for input(s): HGBA1C in the last 72 hours.  CBG: No results for input(s): GLUCAP in the last 168 hours.  Lipid Profile: No results for input(s): CHOL, HDL, LDLCALC, TRIG, CHOLHDL, LDLDIRECT in the last 72 hours.  Thyroid Function Tests: No results for input(s): TSH, T4TOTAL, FREET4, T3FREE, THYROIDAB in the last 72 hours.  Anemia Panel: No results for input(s): VITAMINB12, FOLATE, FERRITIN, TIBC, IRON, RETICCTPCT in the last 72 hours.  Urine analysis:    Component Value Date/Time   COLORURINE STRAW (A) 10/04/2016 1935   APPEARANCEUR CLEAR 10/04/2016 1935   LABSPEC 1.004 (L) 10/04/2016  1935   LABSPEC 1.015 06/26/2016 1129   PHURINE 6.0 10/04/2016 1935   GLUCOSEU NEGATIVE 10/04/2016 1935   GLUCOSEU Negative 06/26/2016 1129   HGBUR NEGATIVE 10/04/2016 1935   BILIRUBINUR NEGATIVE 10/04/2016 1935   BILIRUBINUR Negative 06/26/2016 1129   KETONESUR NEGATIVE 10/04/2016 1935   PROTEINUR NEGATIVE 10/04/2016 1935   UROBILINOGEN 0.2 06/26/2016 1129   NITRITE NEGATIVE 10/04/2016 1935   LEUKOCYTESUR NEGATIVE 10/04/2016 1935   LEUKOCYTESUR Negative 06/26/2016 1129    Sepsis Labs: Lactic Acid, Venous No results found for: LATICACIDVEN  MICROBIOLOGY: No results found for this or any previous visit (from the past 240 hour(s)).  RADIOLOGY STUDIES/RESULTS: Dg Chest 2 View  Result Date: 10/04/2016 CLINICAL DATA:  Acute onset of shortness of breath. Initial encounter. EXAM: CHEST  2 VIEW COMPARISON:  Chest radiograph performed 09/01/2016 FINDINGS: The lungs are well-aerated. Peribronchial  thickening is noted. Rounded left apical opacities are of uncertain significance but could reflect mild infection. There is no evidence of pleural effusion or pneumothorax. The heart is normal in size; the mediastinal contour is within normal limits. No acute osseous abnormalities are seen. The right-sided chest port is noted ending about the mid SVC. IMPRESSION: New rounded left apical airspace opacities, of uncertain significance. These could reflect pneumonia, depending on the patient's symptoms. If the patient does not have clinical signs of pneumonia, CT of the chest could be considered for further evaluation. Otherwise, followup PA and lateral chest X-ray is recommended in 3-4 weeks following trial of antibiotic therapy to ensure resolution and exclude underlying metastatic disease. Electronically Signed   By: Garald Balding M.D.   On: 10/04/2016 20:08   Ct Angio Chest Pe W/cm &/or Wo Cm  Result Date: 10/04/2016 CLINICAL DATA:  Acute onset of generalized weakness and shortness of breath. Current  history of tonsillar cancer, on chemotherapy. Initial encounter. EXAM: CT ANGIOGRAPHY CHEST WITH CONTRAST TECHNIQUE: Multidetector CT imaging of the chest was performed using the standard protocol during bolus administration of intravenous contrast. Multiplanar CT image reconstructions and MIPs were obtained to evaluate the vascular anatomy. CONTRAST:  100 mL of Isovue 370 IV contrast COMPARISON:  Chest radiograph performed earlier today at 7:26 p.m., and PET/CT performed 04/21/2016 FINDINGS: Cardiovascular:  There is no evidence of pulmonary embolus. The heart is borderline normal in size. A right chest port is seen ending about the distal SVC. The great vessels are grossly unremarkable. No calcific atherosclerotic disease is seen. Mediastinum/Nodes: No mediastinal lymphadenopathy is seen. No pericardial effusion is identified. The visualized portions of thyroid gland are unremarkable. No axillary lymphadenopathy is appreciated. Lungs/Pleura: Patchy bilateral airspace opacities are noted, at the upper lobes, compatible with multifocal pneumonia. The previously noted ground-glass and solid nodule at the right lung apex demonstrates increased solid components. The solid component measures 1.2 cm. 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 No pleural effusion or pneumothorax is seen. Upper Abdomen: The visualized portions of the liver and spleen are grossly unremarkable. The visualized portions of the pancreas, adrenal glands and kidneys are within normal limits. Musculoskeletal: No acute osseous abnormalities are identified. The visualized musculature is unremarkable in appearance. Review of the MIP images confirms the above findings. IMPRESSION: 1. No evidence of pulmonary embolus. 2. Patchy bilateral airspace opacities, at the upper lobes, compatible with multifocal pneumonia. This corresponds to the finding on recent chest radiograph. 3. Previously noted  ground-glass and solid nodule at the right lung apex demonstrates increased solid components. The solid component now measures 1.2 cm. 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 Tissue diagnosis would be helpful, as deemed clinically appropriate. Electronically Signed   By: Garald Balding M.D.   On: 10/04/2016 21:48     LOS: 0 days   Oren Binet, MD  [facility] Pager:[phone]  If 7PM-7AM, please contact night-coverage [email or web address] Password [email or web address] 10/05/2016, 1:41 PM

## 2016-10-05 NOTE — Progress Notes (Signed)
Patient not seen yet. Noted CT findings; differential diagnosis could be pneumonia versus radiation pneumonitis Will stop by to see him tomorrow. Continue antibiotics therapy per primary service

## 2016-10-05 NOTE — Progress Notes (Signed)
Pt alert and oriented X 4.  VSS. Denies pain at this time.  Pleasant.

## 2016-10-06 DIAGNOSIS — R0602 Shortness of breath: Secondary | ICD-10-CM

## 2016-10-06 DIAGNOSIS — R918 Other nonspecific abnormal finding of lung field: Secondary | ICD-10-CM

## 2016-10-06 DIAGNOSIS — F17200 Nicotine dependence, unspecified, uncomplicated: Secondary | ICD-10-CM

## 2016-10-06 DIAGNOSIS — F1410 Cocaine abuse, uncomplicated: Secondary | ICD-10-CM

## 2016-10-06 LAB — LEGIONELLA PNEUMOPHILA SEROGP 1 UR AG: L. PNEUMOPHILA SEROGP 1 UR AG: NEGATIVE

## 2016-10-06 MED ORDER — ALBUTEROL SULFATE (2.5 MG/3ML) 0.083% IN NEBU: 2.5000 mg | INHALATION_SOLUTION | RESPIRATORY_TRACT | Status: DC | PRN

## 2016-10-06 MED ORDER — BENZONATATE 100 MG PO CAPS: 200.0000 mg | ORAL_CAPSULE | Freq: Three times a day (TID) | ORAL | Status: DC | PRN

## 2016-10-06 MED ORDER — ACETAMINOPHEN 325 MG PO TABS
650.0000 mg | ORAL_TABLET | Freq: Four times a day (QID) | ORAL | Status: DC | PRN
  Administered 2016-10-06: 650 mg via ORAL
  Filled 2016-10-06: qty 2

## 2016-10-06 MED ORDER — GABAPENTIN 600 MG PO TABS
300.0000 mg | ORAL_TABLET | Freq: Three times a day (TID) | ORAL | Status: DC
  Administered 2016-10-06 – 2016-10-07 (×4): 300 mg via ORAL
  Filled 2016-10-06 (×4): qty 1

## 2016-10-06 MED ORDER — OXYCODONE-ACETAMINOPHEN 5-325 MG PO TABS: 1.0000 | ORAL_TABLET | Freq: Four times a day (QID) | ORAL | Status: DC | PRN

## 2016-10-06 MED ORDER — ONDANSETRON HCL 4 MG/2ML IJ SOLN
4.0000 mg | Freq: Four times a day (QID) | INTRAMUSCULAR | Status: DC | PRN
Start: 2016-10-06 — End: 2016-10-07

## 2016-10-06 MED ORDER — MORPHINE SULFATE (PF) 2 MG/ML IV SOLN: 1.0000 mg | INTRAVENOUS | Status: DC | PRN

## 2016-10-06 MED ORDER — GUAIFENESIN ER 600 MG PO TB12
600.0000 mg | ORAL_TABLET | Freq: Two times a day (BID) | ORAL | Status: DC
  Administered 2016-10-06 – 2016-10-07 (×3): 600 mg via ORAL
  Filled 2016-10-06 (×4): qty 1

## 2016-10-06 NOTE — Progress Notes (Signed)
Report obtained at this time.  Taking over care of patient.  Pt lying in bed without c/o anything.  No s/s of any acute distress.  Call bell in reach

## 2016-10-06 NOTE — Progress Notes (Signed)
[name] [name]   DOB:Jan 31, 1956   [id number]    Subjective: Patient is well known to me. Summary of oncologic history as follows:   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[place])   04/04/2016 Initial Diagnosis    He saw ENT and had laryngoscopy and biopsy of left tonsil      04/04/2016 Pathology Results    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 Chronic small-vessel ischemic changes of the cerebral hemispheric white matter.       04/04/2016 Imaging    CT neck showed left tonsillar mass with diameter of 19 x 24 mm consistent with 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 necrosis. Suspicious level 5/supraclavicular nodes on the left. Scar type density in the right upper lobe is more prominent than was seen on a CT scan of the chest 10/06/2014      04/21/2016 PET scan    Asymmetric hypermetabolic soft tissue prominence in left palatine tonsil, consistent with known primary left tonsillar squamous cell Carcinoma. Left level 2 cervical lymphadenopathy, consistent with metastatic disease. No evidence of metastatic disease within the chest, abdomen, or pelvis. Stable 14 mm ground-glass and part solid nodular opacity in right lung apex compared to previous CT in 2016. This shows low-grade metabolic activity. Low-grade bronchogenic adenocarcinoma cannot be excluded. Consider surgical resection versus continued followup by CT in 12 months      05/17/2016 Procedure    Successful placement of a 20 French pull through gastrostomy tube & right IJ approach [facility] [place].      05/22/2016 - 07/03/2016 Chemotherapy    He received 3 doses of high dose cisplatin with radiation       05/23/2016 - 07/10/2016 Radiation Therapy    Rec'd Helical IMRT:  Left tonsil and bilateral neck / 70 Gy in 35 fractions to gross disease, 63 Gy in 35 fractions to high risk nodal echelons, and  56 Gy in 35 fractions to intermediate risk nodal echelons.          He was admitted to the hospital due to pleuritic chest pain and shortness of breath along with cough. The patient missed his appointment to see me last week. Over the past 2 weeks, he admitted that he has been using cocaine. He usually inhales cocaine with his girlfriend. He also started smoking again. He denies dysphagia. He lost some weight since admission. He is still eating and drinking by mouth and has not used his feeding tube lately.  Objective:  Vitals:   10/06/16 0507 10/06/16 1410  BP: 95/61 103/74  Pulse: 64 84  Resp: 17 18  Temp: 97.9 F (36.6 C) 98.5 F (36.9 C)     Intake/Output Summary (Last 24 hours) at 10/06/16 1652 Last data filed at 10/06/16 1357  Gross per 24 hour  Intake              777 ml  Output             1750 ml  Net             -973 ml    GENERAL:alert, no distress and comfortable SKIN: skin color, texture, turgor are normal, no rashes or significant lesions EYES: normal, Conjunctiva are pink and non-injected, sclera clear OROPHARYNX:no exudate, no erythema and lips, buccal mucosa, and tongue normal  NECK: supple, thyroid normal size, non-tender, without nodularity LYMPH:  no  palpable lymphadenopathy in the cervical, axillary or inguinal LUNGS: clear to auscultation and percussion with normal breathing effort HEART: regular rate & rhythm and no murmurs and no lower extremity edema ABDOMEN:abdomen soft, non-tender and normal bowel sounds. Feeding tube in situ Musculoskeletal:no cyanosis of digits and no clubbing  NEURO: alert & oriented x 3 with fluent speech, no focal motor/sensory deficits   Labs:  Lab Results  Component Value Date   WBC 5.0 10/05/2016   HGB 10.0 (L) 10/05/2016   HCT 28.2 (L) 10/05/2016   MCV 97.9 10/05/2016   PLT 197 10/05/2016   NEUTROABS 5.5 10/04/2016    Lab Results  Component Value Date   NA 133 (L) 10/05/2016   K 4.5 10/05/2016   CL 99 (L)  10/05/2016   CO2 23 10/05/2016    Studies:  Dg Chest 2 View  Result Date: 10/04/2016 CLINICAL DATA:  Acute onset of shortness of breath. Initial encounter. EXAM: CHEST  2 VIEW COMPARISON:  Chest radiograph performed 09/01/2016 FINDINGS: The lungs are well-aerated. Peribronchial thickening is noted. Rounded left apical opacities are of uncertain significance but could reflect mild infection. There is no evidence of pleural effusion or pneumothorax. The heart is normal in size; the mediastinal contour is within normal limits. No acute osseous abnormalities are seen. The right-sided chest port is noted ending about the mid SVC. IMPRESSION: New rounded left apical airspace opacities, of uncertain significance. These could reflect pneumonia, depending on the patient's symptoms. If the patient does not have clinical signs of pneumonia, CT of the chest could be considered for further evaluation. Otherwise, followup PA and lateral chest X-ray is recommended in 3-4 weeks following trial of antibiotic therapy to ensure resolution and exclude underlying metastatic disease. Electronically Signed   By: Garald Balding M.D.   On: 10/04/2016 20:08   Ct Angio Chest Pe W/cm &/or Wo Cm  Result Date: 10/04/2016 CLINICAL DATA:  Acute onset of generalized weakness and shortness of breath. Current history of tonsillar cancer, on chemotherapy. Initial encounter. EXAM: CT ANGIOGRAPHY CHEST WITH CONTRAST TECHNIQUE: Multidetector CT imaging of the chest was performed using the standard protocol during bolus administration of intravenous contrast. Multiplanar CT image reconstructions and MIPs were obtained to evaluate the vascular anatomy. CONTRAST:  100 mL of Isovue 370 IV contrast COMPARISON:  Chest radiograph performed earlier today at 7:26 p.m., and PET/CT performed 04/21/2016 FINDINGS: Cardiovascular:  There is no evidence of pulmonary embolus. The heart is borderline normal in size. A right chest port is seen ending about the  distal SVC. The great vessels are grossly unremarkable. No calcific atherosclerotic disease is seen. Mediastinum/Nodes: No mediastinal lymphadenopathy is seen. No pericardial effusion is identified. The visualized portions of thyroid gland are unremarkable. No axillary lymphadenopathy is appreciated. Lungs/Pleura: Patchy bilateral airspace opacities are noted, at the upper lobes, compatible with multifocal pneumonia. The previously noted ground-glass and solid nodule at the right lung apex demonstrates increased solid components. The solid component measures 1.2 cm. 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 No pleural effusion or pneumothorax is seen. Upper Abdomen: The visualized portions of the liver and spleen are grossly unremarkable. The visualized portions of the pancreas, adrenal glands and kidneys are within normal limits. Musculoskeletal: No acute osseous abnormalities are identified. The visualized musculature is unremarkable in appearance. Review of the MIP images confirms the above findings. IMPRESSION: 1. No evidence of pulmonary embolus. 2. Patchy bilateral airspace opacities, at the upper lobes, compatible with multifocal pneumonia. This corresponds  to the finding on recent chest radiograph. 3. Previously noted ground-glass and solid nodule at the right lung apex demonstrates increased solid components. The solid component now measures 1.2 cm. 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 Tissue diagnosis would be helpful, as deemed clinically appropriate. Electronically Signed   By: Garald Balding M.D.   On: 10/04/2016 21:48    Assessment & Plan:   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[place]) He is recovering well from treatment  I plan to see him back again in 2 weeks for follow-up He will be due for restaging PET CT scan soon  Abnormal lung findings The abnormal findings could be  related to pneumonia versus radiation pneumonitis versus malignancy I will get his case presented at the next ENT tumor board For now, I recommend treating him for atypical pneumonia  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 The patient denies recent history of bleeding such as epistaxis, hematuria or hematochezia. He is asymptomatic from the anemia. We will observe for now.  He does not require transfusion now.   Chronic kidney disease, stage III (moderate) Likely due to dehydration and recent cisplatin I continue to encourage him increase fluids as tolerated  S/P gastrostomy ([place]) He is doing well and has gained weight I will hold off discontinuation of feeding tube until his next visit  Nicotine dependency and cocaine abuse I discussed with the patient the importance of staying abstinent He appears motivated to quit  Discharge planning Will defer to primary service I will sign off and reschedule his appointment to see me in 2 weeks  Heath Lark, MD 10/06/2016  4:52 PM

## 2016-10-06 NOTE — Progress Notes (Addendum)
Pharmacy Antibiotic Note  [name] [name] is a 61 y.o. male admitted on 10/04/2016 with pneumonia.  Pharmacy has been consulted for vancomycin dosing. Slight renal insufficiency noted with sCr 1.35. Today is day #3 of empiric vancomycin and cefepime.  Vancomycin trough goal 15-20  Plan: 1) Vancomycin 500mg  IV q12 2) Follow up for continuation of cefepime 3) Follow renal function, cultures, LOT 4) Vancomycin level as indicated   Height: 6\' 1"  (185.4 cm) Weight: 144 lb 12.8 oz (65.7 kg) IBW/kg (Calculated) : 79.9  Temp (24hrs), Avg:98.2 F (36.8 C), Min:97.9 F (36.6 C), Max:98.4 F (36.9 C)   Recent Labs Lab 10/04/16 1846 10/05/16 0110  WBC 7.3 5.0  CREATININE 1.32* 1.35*    Estimated Creatinine Clearance: 55.4 mL/min (by C-G formula based on SCr of 1.35 mg/dL (H)).    No Known Allergies  Antimicrobials this admission: 1/31 Vancomycin >> 1/31 Cefepime >>  Dose adjustments this admission: N/A  Microbiology results: 2/1 RVP: neg 2/1 blood cx: sent 2/1 resp cx: sent  Thank you for allowing pharmacy to be a part of this patient's care.  Harvel Quale 10/06/2016 11:03 AM

## 2016-10-06 NOTE — Progress Notes (Addendum)
PROGRESS NOTE        PATIENT DETAILS Name: [name] [name] Age: 61 y.o. Sex: male Date of Birth: 05-Dec-1955 Admit Date: 10/04/2016 Admitting Physician Norval Morton, MD [id number] Wilhelmina Mcardle, MD  Brief Narrative: Patient is a 61 y.o. 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 Currently on observation by his oncologist. Admitted to [facility] on 10/04/16 with cough and SOB. Chest CT angiogram negative for pulmonary embolism, found to have multifocal pneumonia. See below for further details.    Subjective:  Patient appears in pleasant mood this morning - denies any SOB, or cough. Complains of neuropathic pain in his bilateral foot.  Assessment/Plan: Multifocal pneumonia: Clinically improved, afebrile overnight. Blood cultures still pending, however respiratory virus panel negative. Await culture results before narrowing antibiotic spectrum. Suspect that if clinical improvement continues, he should be ready for discharge on 2/3.   ? Neuropathic pain: Complains of neuropathic pain in his bilateral foot for the past few weeks, not sure if this is related to recent chemotherapy. Start Neurontin and follow.  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    Lung nodule: Not sure this is pneumonia or malignancy. Repeat imaging in a few weeks after patient completes course of antibiotics. Oncology following.   Anemia: Due to underlying chronic disease, Hgb is close to baseline at 10.0 Today.   History of PEG tube placement: PEG tube remains in place - nutritional services consulted. Recommended Ensure Enlive po 6 times per day. Follow daily weight.    Chronic  Kidney disease stage III: Creatinine close to baseline, at 1.35 Today. Follow periodically.   UDS + for cocaine:counseled  DVT Prophylaxis: Prophylactic  Lovenox   Code Status: Full code  Family Communication: None  Disposition Plan: Remain inpatient-home on 2/3  Antimicrobial agents: Anti-infectives    Start     Dose/Rate Route Frequency Ordered Stop   10/05/16 1100  vancomycin (VANCOCIN) 500 mg in sodium chloride 0.9 % 100 mL IVPB     500 mg 100 mL/hr over 60 Minutes Intravenous Every 12 hours 10/04/16 2225 10/13/16 1059   10/05/16 0600  ceFEPIme (MAXIPIME) 1 g in dextrose 5 % 50 mL IVPB     1 g 100 mL/hr over 30 Minutes Intravenous Every 8 hours 10/05/16 0040 10/13/16 0559   10/04/16 2230  vancomycin (VANCOCIN) IVPB 750 mg/150 ml premix     750 mg 150 mL/hr over 60 Minutes Intravenous  Once 10/04/16 2225 10/05/16 0018   10/04/16 2215  ceFEPIme (MAXIPIME) 2 g in dextrose 5 % 50 mL IVPB     2 g 100 mL/hr over 30 Minutes Intravenous  Once 10/04/16 2214 10/04/16 2318      Procedures: None at this time  CONSULTS: None at this time  Time spent: 25 minutes-Greater than 50% of this time was spent in counseling, explanation of diagnosis, planning of further management, and coordination of care.  MEDICATIONS: Scheduled Meds: . aspirin EC  81 mg Oral Daily  . ceFEPime (MAXIPIME) IV  1 g Intravenous Q8H  . enoxaparin (LOVENOX) injection  40 mg Subcutaneous Q24H  . feeding supplement (ENSURE ENLIVE)  237 mL Oral 6 X Daily  . gabapentin  300 mg  Oral TID  . vancomycin  500 mg Intravenous Q12H   Continuous Infusions: PRN Meds:.   PHYSICAL EXAM: Vital signs: Vitals:   10/05/16 0610 10/05/16 1402 10/05/16 2220 10/06/16 0507  BP: 109/77 116/74 110/78 95/61  Pulse: 61 64 62 64  Resp: 16 16 17 17   Temp: 98.4 F (36.9 C) 98.4 F (36.9 C) 98.2 F (36.8 C) 97.9 F (36.6 C)  TempSrc: Oral Oral Oral Oral  SpO2: 100% 100% 100% 100%  Weight:      Height:       Filed Weights   10/04/16 2200 10/05/16 0044  Weight: 66.7 kg (147 lb 0.8 oz) 65.7 kg (144 lb 12.8 oz)   Body mass index is 19.1 kg/m.   General appearance:  Awake, alert, not in any distress. Speech Clear. Not toxic Looking Eyes: PERRLA, no scleral icterus.Pink conjunctiva HEENT: Atraumatic and Normocephalic Neck: No JVD. No cervical lymphadenopathy. No thyromegaly Resp:Good air entry bilaterally, no added sounds  CVS: S1 S2 regular, no murmurs.  GI: Bowel sounds present, Non tender and not distended with no gaurding, rigidity or rebound.No organomegaly. Appears underweight.  Extremities: B/L Lower Ext shows no edema, both legs are warm to touch Neurology:  Speech clear,Non focal, sensation is grossly intact. Psychiatric: Normal judgment and insight. AAO x 3. Normal mood. Musculoskeletal: No digital cyanosis Skin: No Rash, warm and dry Wounds: N/A  I have personally reviewed following labs and imaging studies  LABORATORY DATA: CBC:  Recent Labs Lab 10/04/16 1846 10/05/16 0110  WBC 7.3 5.0  NEUTROABS 5.5  --   HGB 10.8* 10.0*  HCT 30.4* 28.2*  MCV 97.4 97.9  PLT 244 [id number]    Basic Metabolic Panel:  Recent Labs Lab 10/04/16 1846 10/05/16 0110  NA 131* 133*  K 5.0 4.5  CL 96* 99*  CO2 25 23  GLUCOSE 82 84  BUN 26* 26*  CREATININE 1.32* 1.35*  CALCIUM 9.6 9.0    GFR: Estimated Creatinine Clearance: 55.4 mL/min (by C-G formula based on SCr of 1.35 mg/dL (H)).  Liver Function Tests:  Recent Labs Lab 10/04/16 1846  AST 29  ALT 15*  ALKPHOS 68  BILITOT 0.4  PROT 7.5  ALBUMIN 4.1   No results for input(s): LIPASE, AMYLASE in the last 168 hours. No results for input(s): AMMONIA in the last 168 hours.  Coagulation Profile: No results for input(s): INR, PROTIME in the last 168 hours.  Cardiac Enzymes: No results for input(s): CKTOTAL, CKMB, CKMBINDEX, TROPONINI in the last 168 hours.  BNP (last 3 results) No results for input(s): PROBNP in the last 8760 hours.  HbA1C: No results for input(s): HGBA1C in the last 72 hours.  CBG: No results for input(s): GLUCAP in the last 168 hours.  Lipid Profile: No  results for input(s): CHOL, HDL, LDLCALC, TRIG, CHOLHDL, LDLDIRECT in the last 72 hours.  Thyroid Function Tests: No results for input(s): TSH, T4TOTAL, FREET4, T3FREE, THYROIDAB in the last 72 hours.  Anemia Panel: No results for input(s): VITAMINB12, FOLATE, FERRITIN, TIBC, IRON, RETICCTPCT in the last 72 hours.  Urine analysis:    Component Value Date/Time   COLORURINE STRAW (A) 10/04/2016 1935   APPEARANCEUR CLEAR 10/04/2016 1935   LABSPEC 1.004 (L) 10/04/2016 1935   LABSPEC 1.015 06/26/2016 1129   PHURINE 6.0 10/04/2016 1935   GLUCOSEU NEGATIVE 10/04/2016 1935   GLUCOSEU Negative 06/26/2016 1129   HGBUR NEGATIVE 10/04/2016 [place] 10/04/2016 1935   BILIRUBINUR Negative 06/26/2016 1129   [place]  10/04/2016 1935   PROTEINUR NEGATIVE 10/04/2016 1935   UROBILINOGEN 0.2 06/26/2016 1129   NITRITE NEGATIVE 10/04/2016 1935   LEUKOCYTESUR NEGATIVE 10/04/2016 1935   LEUKOCYTESUR Negative 06/26/2016 1129    Sepsis Labs: Lactic Acid, Venous No results found for: LATICACIDVEN  MICROBIOLOGY: Recent Results (from the past 240 hour(s))  Respiratory Panel by PCR     Status: None   Collection Time: 10/05/16  4:24 PM  Result Value Ref Range Status   Adenovirus NOT DETECTED NOT DETECTED Final   Coronavirus 229E NOT DETECTED NOT DETECTED Final   Coronavirus HKU1 NOT DETECTED NOT DETECTED Final   Coronavirus NL63 NOT DETECTED NOT DETECTED Final   Coronavirus OC43 NOT DETECTED NOT DETECTED Final   Metapneumovirus NOT DETECTED NOT DETECTED Final   Rhinovirus / Enterovirus NOT DETECTED NOT DETECTED Final   Influenza A NOT DETECTED NOT DETECTED Final   Influenza B NOT DETECTED NOT DETECTED Final   Parainfluenza Virus 1 NOT DETECTED NOT DETECTED Final   Parainfluenza Virus 2 NOT DETECTED NOT DETECTED Final   Parainfluenza Virus 3 NOT DETECTED NOT DETECTED Final   Parainfluenza Virus 4 NOT DETECTED NOT DETECTED Final   Respiratory Syncytial Virus NOT  DETECTED NOT DETECTED Final   Bordetella pertussis NOT DETECTED NOT DETECTED Final   Chlamydophila pneumoniae NOT DETECTED NOT DETECTED Final   Mycoplasma pneumoniae NOT DETECTED NOT DETECTED Final    RADIOLOGY STUDIES/RESULTS: Dg Chest 2 View  Result Date: 10/04/2016 CLINICAL DATA:  Acute onset of shortness of breath. Initial encounter. EXAM: CHEST  2 VIEW COMPARISON:  Chest radiograph performed 09/01/2016 FINDINGS: The lungs are well-aerated. Peribronchial thickening is noted. Rounded left apical opacities are of uncertain significance but could reflect mild infection. There is no evidence of pleural effusion or pneumothorax. The heart is normal in size; the mediastinal contour is within normal limits. No acute osseous abnormalities are seen. The right-sided chest port is noted ending about the mid SVC. IMPRESSION: New rounded left apical airspace opacities, of uncertain significance. These could reflect pneumonia, depending on the patient's symptoms. If the patient does not have clinical signs of pneumonia, CT of the chest could be considered for further evaluation. Otherwise, followup PA and lateral chest X-ray is recommended in 3-4 weeks following trial of antibiotic therapy to ensure resolution and exclude underlying metastatic disease. Electronically Signed   By: Garald Balding M.D.   On: 10/04/2016 20:08   Ct Angio Chest Pe W/cm &/or Wo Cm  Result Date: 10/04/2016 CLINICAL DATA:  Acute onset of generalized weakness and shortness of breath. Current history of tonsillar cancer, on chemotherapy. Initial encounter. EXAM: CT ANGIOGRAPHY CHEST WITH CONTRAST TECHNIQUE: Multidetector CT imaging of the chest was performed using the standard protocol during bolus administration of intravenous contrast. Multiplanar CT image reconstructions and MIPs were obtained to evaluate the vascular anatomy. CONTRAST:  100 mL of Isovue 370 IV contrast COMPARISON:  Chest radiograph performed earlier today at 7:26 p.m.,  and PET/CT performed 04/21/2016 FINDINGS: Cardiovascular:  There is no evidence of pulmonary embolus. The heart is borderline normal in size. A right chest port is seen ending about the distal SVC. The great vessels are grossly unremarkable. No calcific atherosclerotic disease is seen. Mediastinum/Nodes: No mediastinal lymphadenopathy is seen. No pericardial effusion is identified. The visualized portions of thyroid gland are unremarkable. No axillary lymphadenopathy is appreciated. Lungs/Pleura: Patchy bilateral airspace opacities are noted, at the upper lobes, compatible with multifocal pneumonia. The previously noted ground-glass and solid nodule at the right lung apex demonstrates  increased solid components. The solid component measures 1.2 cm. 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 No pleural effusion or pneumothorax is seen. Upper Abdomen: The visualized portions of the liver and spleen are grossly unremarkable. The visualized portions of the pancreas, adrenal glands and kidneys are within normal limits. Musculoskeletal: No acute osseous abnormalities are identified. The visualized musculature is unremarkable in appearance. Review of the MIP images confirms the above findings. IMPRESSION: 1. No evidence of pulmonary embolus. 2. Patchy bilateral airspace opacities, at the upper lobes, compatible with multifocal pneumonia. This corresponds to the finding on recent chest radiograph. 3. Previously noted ground-glass and solid nodule at the right lung apex demonstrates increased solid components. The solid component now measures 1.2 cm. 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 Tissue diagnosis would be helpful, as deemed clinically appropriate. Electronically Signed   By: Garald Balding M.D.   On: 10/04/2016 21:48     LOS: 1 day   Rose Clousing, PAS  [employer]  If 7PM-7AM, please contact  night-coverage [email or web address] Password [facility] 10/06/2016, 10:47 AM  Attending MD note  Patient was seen, examined,treatment plan was discussed with the PA-S.  I have personally reviewed the clinical findings, lab, imaging studies and management of this patient in detail. I agree with the documentation, as recorded by the PA-S   Afebrile overnight. Main complaint is tingling and numbness in his bilateral foot.  On Exam: Gen. exam: Awake, alert, not in any distress Chest: Good air entry bilaterally, no rhonchi or rales CVS: S1-S2 regular, no murmurs Abdomen: Soft, nontender and nondistended Neurology: Non-focal Skin: No rash or lesions  Impression: Pneumonia-improving Neuropathic pain-?related to recent chemotherapy History of squamous cell cancer of the tonsil of nausea Lung nodule-not sure if this is secondary to pneumonia or new malignant lesion  Plan: Continue empiric antibiotics-await blood cultures before narrowing any further Start Neurontin Await oncology evaluation   Rest as above  [facility]

## 2016-10-07 DIAGNOSIS — J189 Pneumonia, unspecified organism: Secondary | ICD-10-CM

## 2016-10-07 MED ORDER — LEVOFLOXACIN 750 MG PO TABS: 750.0000 mg | ORAL_TABLET | Freq: Every day | ORAL | 0 refills | Status: DC

## 2016-10-07 NOTE — Discharge Instructions (Signed)
Follow with Primary MD Rogers Blocker, MD & your oncologist in 7 days   Get CBC, CMP, 2 view Chest X ray checked  by Primary MD or SNF MD in 5-7 days ( we routinely change or add medications that can affect your baseline labs and fluid status, therefore we recommend that you get the mentioned basic workup next visit with your PCP, your PCP may decide not to get them or add new tests based on their clinical decision)   Activity: As tolerated with Full fall precautions use walker/cane & assistance as needed   Disposition Home     Diet: Heart Healthy    For Heart failure patients - Check your Weight same time everyday, if you gain over 2 pounds, or you develop in leg swelling, experience more shortness of breath or chest pain, call your Primary MD immediately. Follow Cardiac Low Salt Diet and 1.5 lit/day fluid restriction.   On your next visit with your primary care physician please Get Medicines reviewed and adjusted.   Please request your Prim.MD to go over all Hospital Tests and Procedure/Radiological results at the follow up, please get all Hospital records sent to your Prim MD by signing hospital release before you go home.   If you experience worsening of your admission symptoms, develop shortness of breath, life threatening emergency, suicidal or homicidal thoughts you must seek medical attention immediately by calling 911 or calling your MD immediately  if symptoms less severe.  You Must read complete instructions/literature along with all the possible adverse reactions/side effects for all the Medicines you take and that have been prescribed to you. Take any new Medicines after you have completely understood and accpet all the possible adverse reactions/side effects.   Do not drive, operate heavy machinery, perform activities at heights, swimming or participation in water activities or provide baby sitting services if your were admitted for syncope or siezures until you have seen by  Primary MD or a Neurologist and advised to do so again.  Do not drive when taking Pain medications.    Do not take more than prescribed Pain, Sleep and Anxiety Medications  Special Instructions: If you have smoked or chewed Tobacco  in the last 2 yrs please stop smoking, stop any regular Alcohol  and or any Recreational drug use.  Wear Seat belts while driving.   Please note  You were cared for by a hospitalist during your hospital stay. If you have any questions about your discharge medications or the care you received while you were in the hospital after you are discharged, you can call the unit and asked to speak with the hospitalist on call if the hospitalist that took care of you is not available. Once you are discharged, your primary care physician will handle any further medical issues. Please note that NO REFILLS for any discharge medications will be authorized once you are discharged, as it is imperative that you return to your primary care physician (or establish a relationship with a primary care physician if you do not have one) for your aftercare needs so that they can reassess your need for medications and monitor your lab values.

## 2016-10-07 NOTE — Progress Notes (Signed)
Patient discharged to home. AVS given to patient, patient agreed and verbalized understanding. Patient left unit via wheelchair. No further questions at this moment.  [name] [name] n 10/07/16 12:55 PM

## 2016-10-07 NOTE — Discharge Summary (Signed)
[name] [name] [id number] DOB: 01-31-1956 DOA: 10/04/2016  PCP: Rogers Blocker, MD  Admit date: 10/04/2016  Discharge date: 10/07/2016  Admitted From: Home Disposition:  home   Recommendations for Outpatient Follow-up:   Follow up with PCP in 1-2 weeks  PCP Please obtain BMP/CBC, 2 view CXR in 1week,  (see Discharge instructions)   PCP Please follow up on the following pending results: None   Home Health: None   Equipment/Devices: None  Consultations: Oncology Discharge Condition: Stable   CODE STATUS: Full   Diet Recommendation:  Heart Healthy    Chief Complaint  Patient presents with  . Weakness  . Fatigue  . Chemotherapy     Brief history of present illness from the day of admission and additional interim summary    Patient is a 61 y.o. 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 Currently on observation by his oncologist. Admitted to [facility] on 10/04/16 with cough and SOB. Chest CT angiogram negative for pulmonary embolism, found to have multifocal pneumonia. See below for further details.                                                                  Hospital Course    CAP - Treated with empiric antibiotics and supportive care, cultures negative, appears nontoxic, symptom-free eager to go home, will be placed on 4 days of oral Levaquin and discharged home with PCP follow up. Request PCP to check 2 view chest x-ray, CBC CMP next visit.  History of tonsillar fossa squamous cell cancer, now lung nodule. He has finished treatment for his lung cancer in the past, was seen by his oncologist Dr. Alvy Bimler will follow the patient in the office.  Anemia of chronic disease. At baseline.  History of PEG tube placement - no acute issues.  Taking oral diet.  CKD 3. Creatinine baseline.  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 Counseled to quit all.  Discharge diagnosis     Principal Problem:   Multifocal pneumonia Active Problems:   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HCC)   Lesion of right lung   Unintentional weight loss   Chronic kidney disease, stage III (moderate)    Discharge instructions    Discharge Instructions    Diet - low sodium heart healthy    Complete by:  As directed    Discharge instructions    Complete by:  As directed    Follow with Primary MD Rogers Blocker, MD & your oncologist in 7 days   Get CBC, CMP, 2 view Chest X ray checked  by Primary MD or SNF MD in 5-7 days ( we routinely change or add medications that can affect your baseline labs and fluid status, therefore  we recommend that you get the mentioned basic workup next visit with your PCP, your PCP may decide not to get them or add new tests based on their clinical decision)   Activity: As tolerated with Full fall precautions use walker/cane & assistance as needed   Disposition Home     Diet: Heart Healthy    For Heart failure patients - Check your Weight same time everyday, if you gain over 2 pounds, or you develop in leg swelling, experience more shortness of breath or chest pain, call your Primary MD immediately. Follow Cardiac Low Salt Diet and 1.5 lit/day fluid restriction.   On your next visit with your primary care physician please Get Medicines reviewed and adjusted.   Please request your Prim.MD to go over all Hospital Tests and Procedure/Radiological results at the follow up, please get all Hospital records sent to your Prim MD by signing hospital release before you go home.   If you experience worsening of your admission symptoms, develop shortness of breath, life threatening emergency, suicidal or homicidal thoughts you must seek medical attention immediately by calling 911 or calling your MD  immediately  if symptoms less severe.  You Must read complete instructions/literature along with all the possible adverse reactions/side effects for all the Medicines you take and that have been prescribed to you. Take any new Medicines after you have completely understood and accpet all the possible adverse reactions/side effects.   Do not drive, operate heavy machinery, perform activities at heights, swimming or participation in water activities or provide baby sitting services if your were admitted for syncope or siezures until you have seen by Primary MD or a Neurologist and advised to do so again.  Do not drive when taking Pain medications.    Do not take more than prescribed Pain, Sleep and Anxiety Medications  Special Instructions: If you have smoked or chewed Tobacco  in the last 2 yrs please stop smoking, stop any regular Alcohol  and or any Recreational drug use.  Wear Seat belts while driving.   Please note  You were cared for by a hospitalist during your hospital stay. If you have any questions about your discharge medications or the care you received while you were in the hospital after you are discharged, you can call the unit and asked to speak with the hospitalist on call if the hospitalist that took care of you is not available. Once you are discharged, your primary care physician will handle any further medical issues. Please note that NO REFILLS for any discharge medications will be authorized once you are discharged, as it is imperative that you return to your primary care physician (or establish a relationship with a primary care physician if you do not have one) for your aftercare needs so that they can reassess your need for medications and monitor your lab values.   Increase activity slowly    Complete by:  As directed       Discharge Medications   Allergies as of 10/07/2016   No Known Allergies     Medication List    TAKE these medications   aspirin EC 81 MG  tablet Take 81 mg by mouth daily.   levofloxacin 750 MG tablet Commonly known as:  LEVAQUIN Take 1 tablet (750 mg total) by mouth daily.       Follow-up Information    Rogers Blocker, MD. Schedule an appointment as soon as possible for a visit in 1 week(s).   Specialty:  Internal Medicine Contact information: [address] [place] [address] [phone]        Heath Lark, MD. Schedule an appointment as soon as possible for a visit in 1 week(s).   Specialty:  Hematology and Oncology Contact information: [place] [address] [phone]           Major procedures and Radiology Reports - PLEASE review detailed and final reports thoroughly  -       Dg Chest 2 View  Result Date: 10/04/2016 CLINICAL DATA:  Acute onset of shortness of breath. Initial encounter. EXAM: CHEST  2 VIEW COMPARISON:  Chest radiograph performed 09/01/2016 FINDINGS: The lungs are well-aerated. Peribronchial thickening is noted. Rounded left apical opacities are of uncertain significance but could reflect mild infection. There is no evidence of pleural effusion or pneumothorax. The heart is normal in size; the mediastinal contour is within normal limits. No acute osseous abnormalities are seen. The right-sided chest port is noted ending about the mid SVC. IMPRESSION: New rounded left apical airspace opacities, of uncertain significance. These could reflect pneumonia, depending on the patient's symptoms. If the patient does not have clinical signs of pneumonia, CT of the chest could be considered for further evaluation. Otherwise, followup PA and lateral chest X-ray is recommended in 3-4 weeks following trial of antibiotic therapy to ensure resolution and exclude underlying metastatic disease. Electronically Signed   By: Garald Balding M.D.   On: 10/04/2016 20:08   Ct Angio Chest Pe W/cm &/or Wo Cm  Result Date: 10/04/2016 CLINICAL DATA:  Acute onset of generalized  weakness and shortness of breath. Current history of tonsillar cancer, on chemotherapy. Initial encounter. EXAM: CT ANGIOGRAPHY CHEST WITH CONTRAST TECHNIQUE: Multidetector CT imaging of the chest was performed using the standard protocol during bolus administration of intravenous contrast. Multiplanar CT image reconstructions and MIPs were obtained to evaluate the vascular anatomy. CONTRAST:  100 mL of Isovue 370 IV contrast COMPARISON:  Chest radiograph performed earlier today at 7:26 p.m., and PET/CT performed 04/21/2016 FINDINGS: Cardiovascular:  There is no evidence of pulmonary embolus. The heart is borderline normal in size. A right chest port is seen ending about the distal SVC. The great vessels are grossly unremarkable. No calcific atherosclerotic disease is seen. Mediastinum/Nodes: No mediastinal lymphadenopathy is seen. No pericardial effusion is identified. The visualized portions of thyroid gland are unremarkable. No axillary lymphadenopathy is appreciated. Lungs/Pleura: Patchy bilateral airspace opacities are noted, at the upper lobes, compatible with multifocal pneumonia. The previously noted ground-glass and solid nodule at the right lung apex demonstrates increased solid components. The solid component measures 1.2 cm. 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 No pleural effusion or pneumothorax is seen. Upper Abdomen: The visualized portions of the liver and spleen are grossly unremarkable. The visualized portions of the pancreas, adrenal glands and kidneys are within normal limits. Musculoskeletal: No acute osseous abnormalities are identified. The visualized musculature is unremarkable in appearance. Review of the MIP images confirms the above findings. IMPRESSION: 1. No evidence of pulmonary embolus. 2. Patchy bilateral airspace opacities, at the upper lobes, compatible with multifocal pneumonia. This corresponds to the finding on recent  chest radiograph. 3. Previously noted ground-glass and solid nodule at the right lung apex demonstrates increased solid components. The solid component now measures 1.2 cm. 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 Tissue diagnosis would be helpful, as deemed clinically appropriate. Electronically Signed   By: Jacqulynn Cadet  Chang M.D.   On: 10/04/2016 21:48    Micro Results     Recent Results (from the past 240 hour(s))  Culture, blood (routine x 2) Call MD if unable to obtain prior to antibiotics being given     Status: None (Preliminary result)   Collection Time: 10/05/16  1:10 AM  Result Value Ref Range Status   Specimen Description BLOOD LEFT HAND  Final   Special Requests IN PEDIATRIC BOTTLE 4CC  Final   Culture NO GROWTH 1 DAY  Final   Report Status PENDING  Incomplete  Culture, blood (routine x 2) Call MD if unable to obtain prior to antibiotics being given     Status: None (Preliminary result)   Collection Time: 10/05/16  1:15 AM  Result Value Ref Range Status   Specimen Description BLOOD RIGHT HAND  Final   Special Requests BOTTLES DRAWN AEROBIC AND ANAEROBIC 5CC  Final   Culture NO GROWTH 1 DAY  Final   Report Status PENDING  Incomplete  Respiratory Panel by PCR     Status: None   Collection Time: 10/05/16  4:24 PM  Result Value Ref Range Status   Adenovirus NOT DETECTED NOT DETECTED Final   Coronavirus 229E NOT DETECTED NOT DETECTED Final   Coronavirus HKU1 NOT DETECTED NOT DETECTED Final   Coronavirus NL63 NOT DETECTED NOT DETECTED Final   Coronavirus OC43 NOT DETECTED NOT DETECTED Final   Metapneumovirus NOT DETECTED NOT DETECTED Final   Rhinovirus / Enterovirus NOT DETECTED NOT DETECTED Final   Influenza A NOT DETECTED NOT DETECTED Final   Influenza B NOT DETECTED NOT DETECTED Final   Parainfluenza Virus 1 NOT DETECTED NOT DETECTED Final   Parainfluenza Virus 2 NOT DETECTED NOT DETECTED Final   Parainfluenza Virus 3 NOT  DETECTED NOT DETECTED Final   Parainfluenza Virus 4 NOT DETECTED NOT DETECTED Final   Respiratory Syncytial Virus NOT DETECTED NOT DETECTED Final   Bordetella pertussis NOT DETECTED NOT DETECTED Final   Chlamydophila pneumoniae NOT DETECTED NOT DETECTED Final   Mycoplasma pneumoniae NOT DETECTED NOT DETECTED Final    Today   Subjective    Ernestina Patches today has no headache,no chest abdominal pain,no new weakness tingling or numbness, feels much better wants to go home today.    Objective   Blood pressure 103/64, pulse 74, temperature 98.1 F (36.7 C), temperature source Oral, resp. rate 16, height 6\' 1"  (1.854 m), weight 65.7 kg (144 lb 12.8 oz), SpO2 98 %.   Intake/Output Summary (Last 24 hours) at 10/07/16 1153 Last data filed at 10/07/16 0900  Gross per 24 hour  Intake             1570 ml  Output             1875 ml  Net             -305 ml    Exam Awake Alert, Oriented x 3, No new F.N deficits, Normal affect [place].AT,PERRAL Supple Neck,No JVD, No cervical lymphadenopathy appriciated.  Symmetrical Chest wall movement, Good air movement bilaterally, CTAB RRR,No Gallops,Rubs or new Murmurs, No Parasternal Heave +ve B.Sounds, Abd Soft, Non tender, No organomegaly appriciated, No rebound -guarding or rigidity. No Cyanosis, Clubbing or edema, No new Rash or bruise   Data Review   CBC w Diff: Lab Results  Component Value Date   WBC 5.0 10/05/2016   HGB 10.0 (L) 10/05/2016   HGB 9.8 (L) 09/14/2016   HCT 28.2 (L) 10/05/2016   HCT  27.8 (L) 09/14/2016   PLT 197 10/05/2016   PLT 176 09/14/2016   LYMPHOPCT 13 10/04/2016   LYMPHOPCT 14.7 09/14/2016   MONOPCT 11 10/04/2016   MONOPCT 13.3 09/14/2016   EOSPCT 1 10/04/2016   EOSPCT 1.8 09/14/2016   BASOPCT 0 10/04/2016   BASOPCT 0.3 09/14/2016    CMP: Lab Results  Component Value Date   NA 133 (L) 10/05/2016   NA 135 (L) 09/14/2016   K 4.5 10/05/2016   K 3.9 09/14/2016   CL 99 (L) 10/05/2016   CO2 23 10/05/2016    CO2 26 09/14/2016   BUN 26 (H) 10/05/2016   BUN 26.8 (H) 09/14/2016   CREATININE 1.35 (H) 10/05/2016   CREATININE 1.5 (H) 09/14/2016   PROT 7.5 10/04/2016   PROT 7.2 09/14/2016   ALBUMIN 4.1 10/04/2016   ALBUMIN 3.9 09/14/2016   BILITOT 0.4 10/04/2016   BILITOT 0.36 09/14/2016   ALKPHOS 68 10/04/2016   ALKPHOS 65 09/14/2016   AST 29 10/04/2016   AST 19 09/14/2016   ALT 15 (L) 10/04/2016   ALT 11 09/14/2016  .   Total Time in preparing paper work, data evaluation and todays exam - 35 minutes  Thurnell Lose M.D on 10/07/2016 at 11:53 AM  [facility]   Office  [phone]

## 2016-10-10 LAB — CULTURE, BLOOD (ROUTINE X 2)
Culture: NO GROWTH
Culture: NO GROWTH

## 2016-10-13 MED FILL — GABAPENTIN 100 MG CAPSULE: 100 | 30 days supply | Qty: 30 | Fill #0

## 2016-10-17 ENCOUNTER — Ambulatory Visit (HOSPITAL_BASED_OUTPATIENT_CLINIC_OR_DEPARTMENT_OTHER): Payer: Medicaid Other | Admitting: Hematology and Oncology

## 2016-10-17 ENCOUNTER — Telehealth: Payer: Self-pay | Admitting: Hematology and Oncology

## 2016-10-17 ENCOUNTER — Other Ambulatory Visit (HOSPITAL_BASED_OUTPATIENT_CLINIC_OR_DEPARTMENT_OTHER): Payer: Medicaid Other

## 2016-10-17 VITALS — BP 118/79 | HR 100 | Temp 98.4°F | Resp 20 | Ht 73.0 in | Wt 147.0 lb

## 2016-10-17 DIAGNOSIS — N183 Chronic kidney disease, stage 3 unspecified: Secondary | ICD-10-CM

## 2016-10-17 DIAGNOSIS — C090 Malignant neoplasm of tonsillar fossa: Secondary | ICD-10-CM | POA: Diagnosis not present

## 2016-10-17 DIAGNOSIS — G620 Drug-induced polyneuropathy: Secondary | ICD-10-CM

## 2016-10-17 DIAGNOSIS — T451X5A Adverse effect of antineoplastic and immunosuppressive drugs, initial encounter: Secondary | ICD-10-CM

## 2016-10-17 LAB — CBC WITH DIFFERENTIAL/PLATELET
BASO%: 0.2 % (ref 0.0–2.0)
Basophils Absolute: 0 10*3/uL (ref 0.0–0.1)
EOS ABS: 0 10*3/uL (ref 0.0–0.5)
EOS%: 0.6 % (ref 0.0–7.0)
HEMATOCRIT: 34.9 % — AB (ref 38.4–49.9)
HGB: 12 g/dL — ABNORMAL LOW (ref 13.0–17.1)
LYMPH#: 0.7 10*3/uL — AB (ref 0.9–3.3)
LYMPH%: 12.3 % — AB (ref 14.0–49.0)
MCH: 35.3 pg — ABNORMAL HIGH (ref 27.2–33.4)
MCHC: 34.3 g/dL (ref 32.0–36.0)
MCV: 102.7 fL — AB (ref 79.3–98.0)
MONO#: 0.5 10*3/uL (ref 0.1–0.9)
MONO%: 9.1 % (ref 0.0–14.0)
NEUT%: 77.8 % — ABNORMAL HIGH (ref 39.0–75.0)
NEUTROS ABS: 4.3 10*3/uL (ref 1.5–6.5)
PLATELETS: 247 10*3/uL (ref 140–400)
RBC: 3.4 10*6/uL — ABNORMAL LOW (ref 4.20–5.82)
RDW: 12.1 % (ref 11.0–14.6)
WBC: 5.5 10*3/uL (ref 4.0–10.3)

## 2016-10-17 LAB — COMPREHENSIVE METABOLIC PANEL
ALT: 20 U/L (ref 0–55)
AST: 26 U/L (ref 5–34)
Albumin: 4.1 g/dL (ref 3.5–5.0)
Alkaline Phosphatase: 78 U/L (ref 40–150)
Anion Gap: 12 mEq/L — ABNORMAL HIGH (ref 3–11)
BUN: 32.6 mg/dL — AB (ref 7.0–26.0)
CALCIUM: 10.3 mg/dL (ref 8.4–10.4)
CHLORIDE: 99 meq/L (ref 98–109)
CO2: 25 mEq/L (ref 22–29)
Creatinine: 1.5 mg/dL — ABNORMAL HIGH (ref 0.7–1.3)
EGFR: 59 mL/min/{1.73_m2} — AB (ref >90)
GLUCOSE: 104 mg/dL (ref 70–140)
POTASSIUM: 4.3 meq/L (ref 3.5–5.1)
SODIUM: 135 meq/L — AB (ref 136–145)
Total Bilirubin: 0.22 mg/dL (ref 0.20–1.20)
Total Protein: 8 g/dL (ref 6.4–8.3)

## 2016-10-17 MED ORDER — PREDNISONE 10 MG PO TABS: 10.0000 mg | ORAL_TABLET | Freq: Every day | ORAL | 0 refills | Status: DC

## 2016-10-17 MED ORDER — GABAPENTIN 300 MG PO CAPS: 300.0000 mg | ORAL_CAPSULE | Freq: Three times a day (TID) | ORAL | 1 refills | Status: DC

## 2016-10-17 MED FILL — GABAPENTIN 300 MG CAPSULE: 300 | 30 days supply | Qty: 90 | Fill #0

## 2016-10-17 MED FILL — predniSONE 10 MG TABS: 10 | 30 days supply | Qty: 30 | Fill #0

## 2016-10-17 NOTE — Telephone Encounter (Signed)
Appointments scheduled per 2/13 LOS. Patient notified.

## 2016-10-19 ENCOUNTER — Encounter: Payer: Self-pay | Admitting: Hematology and Oncology

## 2016-10-19 MED FILL — PROAIR HFA 90 MCG INHALER: 108 (90 BAS | 30 days supply | Qty: 9 | Fill #1

## 2016-10-19 NOTE — Assessment & Plan Note (Signed)
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

## 2016-10-19 NOTE — Assessment & Plan Note (Signed)
He is recovering well from treatment  He will be due for restaging PET CT scan soon Ciinical exam is benign

## 2016-10-19 NOTE — Assessment & Plan Note (Signed)
Likely due to dehydration and recent cisplatin I continue to encourage him increase fluids as tolerated 

## 2016-10-19 NOTE — Progress Notes (Signed)
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 Chronic small-vessel ischemic changes of the cerebral hemispheric white matter.       04/04/2016 Imaging    CT neck showed left tonsillar mass with diameter of 19 x 24 mm consistent with 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 necrosis. Suspicious level 5/supraclavicular nodes on the left. Scar type density in the right upper lobe is more prominent than was seen on a CT scan of the chest 10/06/2014      04/21/2016 PET scan    Asymmetric hypermetabolic soft tissue prominence in left palatine tonsil, consistent with known primary left tonsillar squamous cell Carcinoma. Left level 2 cervical lymphadenopathy, consistent with metastatic disease. No evidence of metastatic disease within the chest, abdomen, or pelvis. Stable 14 mm ground-glass and part solid nodular opacity in right lung apex compared to previous CT in 2016. This shows low-grade metabolic activity. Low-grade bronchogenic adenocarcinoma cannot be excluded. Consider surgical resection versus continued followup by CT in 12 months      05/17/2016 Procedure    Successful placement of a 20 French pull  through gastrostomy tube & right IJ approach [employer].      05/22/2016 - 07/03/2016 Chemotherapy    He received 3 doses of high dose cisplatin with radiation       05/23/2016 - 07/10/2016 Radiation Therapy    Rec'd Helical IMRT:  Left tonsil and bilateral neck / 70 Gy in 35 fractions to gross disease, 63 Gy in 35 fractions to high risk nodal echelons, and 56 Gy in 35 fractions to intermediate risk nodal echelons.          10/04/2016 - 10/07/2016 Hospital Admission    He was admitted due to atypical chest pain from illicit drug use. CT scan showed possible atypical pneumonia versus radiation pneumonitis      10/04/2016 Imaging    Ct scan showed no evidence of pulmonary embolus. 2. Patchy bilateral airspace opacities, at the upper lobes, compatible with multifocal pneumonia. This corresponds to the finding on recent chest radiograph. 3. Previously noted ground-glass and solid nodule at the right lung apex demonstrates increased solid components. The solid component now measures 1.2 cm. 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 Tissue diagnosis would be helpful, as deemed clinically appropriate.       INTERVAL HISTORY: Please see below for problem oriented charting. He is seen after recent hospital stay He denies further chest pain or cough He denies recent drug use. Has been smoking intermittently and drinking He felt recent worsening neuropathic pain  REVIEW OF SYSTEMS:   Constitutional: Denies fevers, chills or abnormal weight loss Eyes: Denies blurriness of vision Ears, nose, mouth, throat, and face: Denies mucositis  or sore throat Respiratory: Denies cough, dyspnea or wheezes Cardiovascular: Denies palpitation, chest discomfort or lower extremity swelling Gastrointestinal:  Denies nausea, heartburn or change in bowel habits Skin: Denies abnormal skin rashes Lymphatics: Denies new lymphadenopathy or easy  bruising Neurological:Denies numbness, tingling or new weaknesses Behavioral/Psych: Mood is stable, no new changes  All other systems were reviewed with the patient and are negative.  I have reviewed the past medical history, past surgical history, social history and family history with the patient and they are unchanged from previous note.  ALLERGIES:  has No Known Allergies.  MEDICATIONS:  Current Outpatient Prescriptions  Medication Sig Dispense Refill  . aspirin EC 81 MG tablet Take 81 mg by mouth daily.    Marland Kitchen gabapentin (NEURONTIN) 100 MG capsule Take 100 mg by mouth daily.  2  . gabapentin (NEURONTIN) 300 MG capsule Take 1 capsule (300 mg total) by mouth 3 (three) times daily. 90 capsule 1  . predniSONE (DELTASONE) 10 MG tablet Take 1 tablet (10 mg total) by mouth daily with breakfast. 30 tablet 0   Current Facility-Administered Medications  Medication Dose Route Frequency Provider Last Rate Last Dose  . dexamethasone (DECADRON) injection 10 mg  10 mg Intravenous Once Heath Lark, MD      . LORazepam (ATIVAN) injection 0.5 mg  0.5 mg Intravenous Once Heath Lark, MD       Facility-Administered Medications Ordered in Other Visits  Medication Dose Route Frequency Provider Last Rate Last Dose  . sodium chloride flush (NS) 0.9 % injection 10 mL  10 mL Intracatheter PRN Heath Lark, MD   10 mL at 05/24/16 1615    PHYSICAL EXAMINATION: ECOG PERFORMANCE STATUS: 1 - Symptomatic but completely ambulatory  Vitals:   10/17/16 1349  BP: 118/79  Pulse: 100  Resp: 20  Temp: 98.4 F (36.9 C)   Filed Weights   10/17/16 1349  Weight: 147 lb (66.7 kg)    GENERAL:alert, no distress and comfortable SKIN: skin color, texture, turgor are normal, no rashes or significant lesions EYES: normal, Conjunctiva are pink and non-injected, sclera clear OROPHARYNX:no exudate, no erythema and lips, buccal mucosa, and tongue normal  NECK: supple, thyroid normal size, non-tender, without  nodularity LYMPH:  no palpable lymphadenopathy in the cervical, axillary or inguinal LUNGS: clear to auscultation and percussion with normal breathing effort HEART: regular rate & rhythm and no murmurs and no lower extremity edema ABDOMEN:abdomen soft, non-tender and normal bowel sounds Musculoskeletal:no cyanosis of digits and no clubbing  NEURO: alert & oriented x 3 with fluent speech, no focal motor/sensory deficits  LABORATORY DATA:  I have reviewed the data as listed    Component Value Date/Time   NA 135 (L) 10/17/2016 1341   K 4.3 10/17/2016 1341   CL 99 (L) 10/05/2016 0110   CO2 25 10/17/2016 1341   GLUCOSE 104 10/17/2016 1341   BUN 32.6 (H) 10/17/2016 1341   CREATININE 1.5 (H) 10/17/2016 1341   CALCIUM 10.3 10/17/2016 1341   PROT 8.0 10/17/2016 1341   ALBUMIN 4.1 10/17/2016 1341   AST 26 10/17/2016 1341   ALT 20 10/17/2016 1341   ALKPHOS 78 10/17/2016 1341   BILITOT 0.22 10/17/2016 1341   GFRNONAA 56 (L) 10/05/2016 0110   GFRAA >60 10/05/2016 0110    No results found for: SPEP, UPEP  Lab Results  Component Value Date   WBC 5.5 10/17/2016   NEUTROABS 4.3 10/17/2016   HGB 12.0 (L) 10/17/2016   HCT 34.9 (L) 10/17/2016   MCV  102.7 (H) 10/17/2016   PLT 247 10/17/2016      Chemistry      Component Value Date/Time   NA 135 (L) 10/17/2016 1341   K 4.3 10/17/2016 1341   CL 99 (L) 10/05/2016 0110   CO2 25 10/17/2016 1341   BUN 32.6 (H) 10/17/2016 1341   CREATININE 1.5 (H) 10/17/2016 1341      Component Value Date/Time   CALCIUM 10.3 10/17/2016 1341   ALKPHOS 78 10/17/2016 1341   AST 26 10/17/2016 1341   ALT 20 10/17/2016 1341   BILITOT 0.22 10/17/2016 1341       RADIOGRAPHIC STUDIES: I have personally reviewed the radiological images as listed and agreed with the findings in the report. Dg Chest 2 View  Result Date: 10/04/2016 CLINICAL DATA:  Acute onset of shortness of breath. Initial encounter. EXAM: CHEST  2 VIEW COMPARISON:  Chest radiograph  performed 09/01/2016 FINDINGS: The lungs are well-aerated. Peribronchial thickening is noted. Rounded left apical opacities are of uncertain significance but could reflect mild infection. There is no evidence of pleural effusion or pneumothorax. The heart is normal in size; the mediastinal contour is within normal limits. No acute osseous abnormalities are seen. The right-sided chest port is noted ending about the mid SVC. IMPRESSION: New rounded left apical airspace opacities, of uncertain significance. These could reflect pneumonia, depending on the patient's symptoms. If the patient does not have clinical signs of pneumonia, CT of the chest could be considered for further evaluation. Otherwise, followup PA and lateral chest X-ray is recommended in 3-4 weeks following trial of antibiotic therapy to ensure resolution and exclude underlying metastatic disease. Electronically Signed   By: Garald Balding M.D.   On: 10/04/2016 20:08   Ct Angio Chest Pe W/cm &/or Wo Cm  Result Date: 10/04/2016 CLINICAL DATA:  Acute onset of generalized weakness and shortness of breath. Current history of tonsillar cancer, on chemotherapy. Initial encounter. EXAM: CT ANGIOGRAPHY CHEST WITH CONTRAST TECHNIQUE: Multidetector CT imaging of the chest was performed using the standard protocol during bolus administration of intravenous contrast. Multiplanar CT image reconstructions and MIPs were obtained to evaluate the vascular anatomy. CONTRAST:  100 mL of Isovue 370 IV contrast COMPARISON:  Chest radiograph performed earlier today at 7:26 p.m., and PET/CT performed 04/21/2016 FINDINGS: Cardiovascular:  There is no evidence of pulmonary embolus. The heart is borderline normal in size. A right chest port is seen ending about the distal SVC. The great vessels are grossly unremarkable. No calcific atherosclerotic disease is seen. Mediastinum/Nodes: No mediastinal lymphadenopathy is seen. No pericardial effusion is identified. The visualized  portions of thyroid gland are unremarkable. No axillary lymphadenopathy is appreciated. Lungs/Pleura: Patchy bilateral airspace opacities are noted, at the upper lobes, compatible with multifocal pneumonia. The previously noted ground-glass and solid nodule at the right lung apex demonstrates increased solid components. The solid component measures 1.2 cm. 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 No pleural effusion or pneumothorax is seen. Upper Abdomen: The visualized portions of the liver and spleen are grossly unremarkable. The visualized portions of the pancreas, adrenal glands and kidneys are within normal limits. Musculoskeletal: No acute osseous abnormalities are identified. The visualized musculature is unremarkable in appearance. Review of the MIP images confirms the above findings. IMPRESSION: 1. No evidence of pulmonary embolus. 2. Patchy bilateral airspace opacities, at the upper lobes, compatible with multifocal pneumonia. This corresponds to the finding on recent chest radiograph. 3. Previously noted ground-glass and solid nodule at the  right lung apex demonstrates increased solid components. The solid component now measures 1.2 cm. 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 Tissue diagnosis would be helpful, as deemed clinically appropriate. Electronically Signed   By: Garald Balding M.D.   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[place]) He is recovering well from treatment  He will be due for restaging PET CT scan soon Ciinical exam is benign  Peripheral neuropathy due to chemotherapy [facility]) He has neuropathy due to chemo and likely exacerbated by alcohol intake I recommend stop alcohol immediately I recommend a trial of gabapentin We discussed risk of nausea, constipation and sedation  Chronic kidney disease, stage III (moderate) Likely  due to dehydration and recent cisplatin I continue to encourage him increase fluids as tolerated   No orders of the defined types were placed in this encounter.  All questions were answered. The patient knows to call the clinic with any problems, questions or concerns. No barriers to learning was detected. I spent 15 minutes counseling the patient face to face. The total time spent in the appointment was 20 minutes and more than 50% was on counseling and review of test results     Heath Lark, MD 10/19/2016 4:26 AM

## 2016-10-23 NOTE — Progress Notes (Signed)
Mr. Wilcken presents for follow up of radiation completed 07/10/16 to his Left Tonsil and bilateral neck.   Pain issues, if any: He reports tingling and numbness to his feet/legs and fingers/arms. He is taking gabapentin 300 mg twice daily. He tells me he does not feel like it is helping him.  Using a feeding tube?: Yes, he is not taking nutrition through his feeding tube. He is flushing it daily.  Weight changes, if any:  Wt Readings from Last 3 Encounters:  10/27/16 143 lb (64.9 kg)  10/17/16 147 lb (66.7 kg)  10/05/16 144 lb 12.8 oz (65.7 kg)   Swallowing issues, if any: He is eating well. He is not modifying his diet.  Smoking or chewing tobacco? He tells me he quit one week ago. He had started back after completing treatment.  Using fluoride trays daily? No Last ENT visit was on: Dr. Erik Obey not since diagnosis.  Other notable issues, if any:  He tells me that his taste has returned, but he has a decreased appetite. He has been placed on prednisone daily and he does feel like it has helped some.   PET 10/26/16  BP 109/83   Pulse 82   Temp 99.1 F (37.3 C)   Ht 6\' 1"  (1.854 m)   Wt 143 lb (64.9 kg)   SpO2 100% Comment: room air  BMI 18.87 kg/m

## 2016-10-26 ENCOUNTER — Encounter (HOSPITAL_COMMUNITY)
Admission: RE | Admit: 2016-10-26 | Discharge: 2016-10-26 | Disposition: A | Discharge status: Home / Self Care | Payer: Medicaid Other | Source: Ambulatory Visit | Attending: Radiation Oncology | Admitting: Radiation Oncology

## 2016-10-26 ENCOUNTER — Ambulatory Visit
Admission: RE | Admit: 2016-10-26 | Discharge: 2016-10-26 | Disposition: A | Discharge status: Home / Self Care | Payer: Medicaid Other | Source: Ambulatory Visit | Attending: Radiation Oncology | Admitting: Radiation Oncology

## 2016-10-26 ENCOUNTER — Other Ambulatory Visit: Payer: Self-pay | Admitting: *Deleted

## 2016-10-26 DIAGNOSIS — C090 Malignant neoplasm of tonsillar fossa: Secondary | ICD-10-CM | POA: Diagnosis not present

## 2016-10-26 LAB — TSH: TSH: 1.388 m[IU]/L (ref 0.320–4.118)

## 2016-10-26 LAB — GLUCOSE, CAPILLARY: GLUCOSE-CAPILLARY: 88 mg/dL (ref 65–99)

## 2016-10-26 MED ORDER — DIAZEPAM 5 MG PO TABS: 5.0000 mg | ORAL_TABLET | Freq: Four times a day (QID) | ORAL | 0 refills | Status: DC | PRN

## 2016-10-26 MED ORDER — FLUDEOXYGLUCOSE F - 18 (FDG) INJECTION
7.6000 | Freq: Once | INTRAVENOUS | Status: AC | PRN
  Administered 2016-10-26: 7.6 via INTRAVENOUS

## 2016-10-27 ENCOUNTER — Encounter: Payer: Self-pay | Admitting: Radiation Oncology

## 2016-10-27 ENCOUNTER — Encounter: Payer: Self-pay | Admitting: *Deleted

## 2016-10-27 ENCOUNTER — Ambulatory Visit
Admission: RE | Admit: 2016-10-27 | Discharge: 2016-10-27 | Disposition: A | Discharge status: Home / Self Care | Payer: Medicaid Other | Source: Ambulatory Visit | Attending: Radiation Oncology | Admitting: Radiation Oncology

## 2016-10-27 DIAGNOSIS — Z720 Tobacco use: Secondary | ICD-10-CM | POA: Diagnosis not present

## 2016-10-27 DIAGNOSIS — Z79899 Other long term (current) drug therapy: Secondary | ICD-10-CM | POA: Diagnosis not present

## 2016-10-27 DIAGNOSIS — C090 Malignant neoplasm of tonsillar fossa: Secondary | ICD-10-CM | POA: Diagnosis present

## 2016-10-27 DIAGNOSIS — Z7982 Long term (current) use of aspirin: Secondary | ICD-10-CM | POA: Diagnosis not present

## 2016-10-27 DIAGNOSIS — Z931 Gastrostomy status: Secondary | ICD-10-CM | POA: Diagnosis not present

## 2016-10-27 NOTE — Progress Notes (Signed)
Radiation Oncology         [phone] ________________________________  Name: [name] [name] MRN: [id number]  Date: 10/27/2016  DOB: [birth year]  Follow-Up Visit Note  CC: Rogers Blocker, MD  Jodi Marble, MD  Diagnosis and Prior Radiotherapy:       ICD-9-CM ICD-10-CM   1. 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  We are joined today by Gayleen Orem, RN, Head and Neck Nurse Navigator.  On review of systems, the patient reports tingling and numbness throughout his body, especially when he bends his back or neck forward. He is taking Gabapentin 300 mg twice daily at this time, though he does not feel like it is helping. He has a feeding tube in place but is not taking nutrition through it at this time; he is flushing it daily. The patient denies any issues swallowing and is eating without modifying his diet. He reports his taste has returned, though his appetite has not come back completely. He is on Prednisone daily and he feels that this has helped his appetite some. He reports he quit smoking one week ago; he had started back after completing treatment. He is not using fluoride trays at this time. The patient has not seen Dr. Erik Obey since his diagnosis.   The patient had a recent PET scan on 10/26/16 showing no evidence of disease residual or recurrence.  ALLERGIES:  has No Known Allergies.  Meds: Current Outpatient Prescriptions  Medication Sig Dispense Refill  . aspirin EC 81 MG tablet Take 81 mg by mouth daily.    Marland Kitchen gabapentin (NEURONTIN) 300 MG capsule Take 1 capsule (300 mg  total) by mouth 3 (three) times daily. 90 capsule 1  . predniSONE (DELTASONE) 10 MG tablet Take 1 tablet (10 mg total) by mouth daily with breakfast. 30 tablet 0   Current Facility-Administered Medications  Medication Dose Route Frequency Provider Last Rate Last Dose  . dexamethasone (DECADRON) injection 10 mg  10 mg Intravenous Once Heath Lark, MD      . LORazepam (ATIVAN) injection 0.5 mg  0.5 mg Intravenous Once Heath Lark, MD       Facility-Administered Medications Ordered in Other Encounters  Medication Dose Route Frequency Provider Last Rate Last Dose  . sodium chloride flush (NS) 0.9 % injection 10 mL  10 mL Intracatheter PRN Heath Lark, MD   10 mL at 05/24/16 1615    Physical Findings: The patient is in no acute distress. Patient is alert and oriented. Wt Readings from Last 3 Encounters:  10/27/16 143 lb (64.9 kg)  10/17/16 147 lb (66.7 kg)  10/05/16 144 lb 12.8 oz (65.7 kg)    height is 6\' 1"  (1.854 m) and weight is 143 lb (64.9 kg). His temperature is 99.1 F (37.3 C). His blood pressure is 109/83 and his pulse is 82. His oxygen saturation is 100%.  General: Alert and oriented, in no acute distress HEENT: Extraocular movements are intact. Oropharynx and oral cavity are clear. No sign of tumor. Neck: Neck is notable for no palpable masses in the cervical or supraclavicular neck. Skin: Skin in treatment  fields is intact and slightly dry. Heart: Regular in rate and rhythm with no murmurs. Chest: Clear to auscultation bilaterally. Vascular: PAC in right upper chest. Abdomen: Soft, nontender, nondistended, with no rigidity or guarding. PEG tube intact in the left abdomen. Extremities: No edema in extremities. Lymphatics: see Neck Exam Psychiatric: Judgment and insight are intact. Affect is appropriate.   Lab Findings: Lab Results  Component Value Date   WBC 5.5 10/17/2016   HGB 12.0 (L) 10/17/2016   HCT 34.9 (L) 10/17/2016   MCV 102.7 (H) 10/17/2016   PLT 247 10/17/2016     Lab Results  Component Value Date   TSH 1.388 10/26/2016    Radiographic Findings: Dg Chest 2 View  Result Date: 10/04/2016 CLINICAL DATA:  Acute onset of shortness of breath. Initial encounter. EXAM: CHEST  2 VIEW COMPARISON:  Chest radiograph performed 09/01/2016 FINDINGS: The lungs are well-aerated. Peribronchial thickening is noted. Rounded left apical opacities are of uncertain significance but could reflect mild infection. There is no evidence of pleural effusion or pneumothorax. The heart is normal in size; the mediastinal contour is within normal limits. No acute osseous abnormalities are seen. The right-sided chest port is noted ending about the mid SVC. IMPRESSION: New rounded left apical airspace opacities, of uncertain significance. These could reflect pneumonia, depending on the patient's symptoms. If the patient does not have clinical signs of pneumonia, CT of the chest could be considered for further evaluation. Otherwise, followup PA and lateral chest X-ray is recommended in 3-4 weeks following trial of antibiotic therapy to ensure resolution and exclude underlying metastatic disease. Electronically Signed   By: Garald Balding M.D.   On: 10/04/2016 20:08   Ct Angio Chest Pe W/cm &/or Wo Cm  Result Date: 10/04/2016 CLINICAL DATA:  Acute onset of generalized weakness and shortness of breath. Current history of tonsillar cancer, on chemotherapy. Initial encounter. EXAM: CT ANGIOGRAPHY CHEST WITH CONTRAST TECHNIQUE: Multidetector CT imaging of the chest was performed using the standard protocol during bolus administration of intravenous contrast. Multiplanar CT image reconstructions and MIPs were obtained to evaluate the vascular anatomy. CONTRAST:  100 mL of Isovue 370 IV contrast COMPARISON:  Chest radiograph performed earlier today at 7:26 p.m., and PET/CT performed 04/21/2016 FINDINGS: Cardiovascular:  There is no evidence of pulmonary embolus. The heart is borderline normal in  size. A right chest port is seen ending about the distal SVC. The great vessels are grossly unremarkable. No calcific atherosclerotic disease is seen. Mediastinum/Nodes: No mediastinal lymphadenopathy is seen. No pericardial effusion is identified. The visualized portions of thyroid gland are unremarkable. No axillary lymphadenopathy is appreciated. Lungs/Pleura: Patchy bilateral airspace opacities are noted, at the upper lobes, compatible with multifocal pneumonia. The previously noted ground-glass and solid nodule at the right lung apex demonstrates increased solid components. The solid component measures 1.2 cm. 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 No pleural effusion or pneumothorax is seen. Upper Abdomen: The visualized portions of the liver and spleen are grossly unremarkable. The visualized portions of the pancreas, adrenal glands and kidneys are within normal limits. Musculoskeletal: No acute osseous abnormalities are identified. The visualized musculature is unremarkable in appearance. Review of the MIP images confirms the above findings. IMPRESSION: 1. No evidence of pulmonary embolus. 2. Patchy bilateral airspace opacities, at the upper lobes, compatible with multifocal pneumonia. This corresponds to the finding on recent chest radiograph. 3. Previously noted ground-glass and solid nodule at the right lung apex demonstrates increased solid components. The  solid component now measures 1.2 cm. 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 Tissue diagnosis would be helpful, as deemed clinically appropriate. Electronically Signed   By: Garald Balding M.D.   On: 10/04/2016 21:48   Nm Pet Image Restag (ps) Skull Base To Thigh  Result Date: 10/26/2016 CLINICAL DATA:  Subsequent treatment strategy for head and neck cancer. Enlarging right upper lobe lesion. EXAM: NUCLEAR MEDICINE PET SKULL BASE TO  THIGH TECHNIQUE: 7.6 mCi F-18 FDG was injected intravenously. Full-ring PET imaging was performed from the skull base to thigh after the radiotracer. CT data was obtained and used for attenuation correction and anatomic localization. FASTING BLOOD GLUCOSE:  Value: 88 mg/dl COMPARISON:  Chest CT 10/04/2016.  PET-CT 04/21/2016 FINDINGS: NECK No hypermetabolic lymph nodes in the neck. CHEST No hypermetabolic mediastinal or hilar nodes. The right apical lesion is sub solid with a slightly enlarged solid portion. However, this is not metabolically active on the PET scan with SUV max of 1.99. Multiple ill-defined airspace opacities in the upper lobes bilaterally persists since the recent CT scan and are mildly hypermetabolic. The subpleural airspace process at the left lung apex has an SUV max of 5.6. A more medial density appears smaller and more 6 seconds on today's examination it measures 14 x 13 mm. Previously measured 18 x 17 mm. SUV max is 3 point Anterior right apical density is mildly hypermetabolic measuring 3.6. Stable emphysematous changes and pulmonary scarring. No lower lobe lung lesions or pleural effusion. ABDOMEN/PELVIS No abnormal hypermetabolic activity within the liver, pancreas, adrenal glands, or spleen. No hypermetabolic lymph nodes in the abdomen or pelvis. A feeding gastrostomy tube is in place. SKELETON No focal hypermetabolic activity to suggest skeletal metastasis. Stable numerous lucencies in the the pelvis but no hypermetabolism. IMPRESSION: 1. Slight increasing solid component of a right apical lung lesion but no concerning hypermetabolism. Recommend continued surveillance. 2. Bilateral patchy apical airspace opacities are mild to moderately hypermetabolic and likely reflect infection. Recommend short-term followup noncontrast chest CT in 3 months to reassess. 3. No mediastinal or hilar mass or adenopathy. 4. No significant findings in the abdomen/pelvis. 5. Stable lucencies throughout the  bony pelvis but no hypermetabolism to suggest metastasis. Electronically Signed   By: Marijo Sanes M.D.   On: 10/26/2016 15:55    Impression/Plan:    1) Head and Neck Cancer Status: No evidence of disease recurrence.  2) Nutritional Status: good PEG tube: yes We discussed that once the patient can maintain his weight x41mo without the feeding tube, the tube may be removed.  3) Risk Factors: I asked the patient today about tobacco use. The patient uses tobacco.  I advised the patient to quit. Services were offered by me today including outpatient counseling and pharmacotherapy. I assessed for the willingness to attempt to quit and provided encouragement and demonstrated willingness to make referrals and/or prescriptions to help the patient attempt to quit. The patient has follow-up with the oncologic team to touch base on their tobacco use and /or cessation efforts.  Over 3 minutes were spent on this issue. Goal quit date is 12-03-16.  He declines Rx but may try nicotine gum to help  4) Swallowing: no concerns at this time.  5) Dental: Encouraged to continue regular followup with dentistry, and dental hygiene including fluoride rinses.  6) Thyroid function: continue to check q6-12 mo Lab Results  Component Value Date   TSH 1.388 10/26/2016    7) Other: I will refer  the patient to multidisciplinary clinic for physical therapy for peripheral neuropathy, swallowing therapy, nutrition counseling, and social work for smoking cessation.  8) Follow-up with Dr. Erik Obey in 3 months and Dr. Alvy Bimler in 6 months with repeat chest CT at that time to follow the apical lung lesions that I think are benign. The patient will follow with me on an as needed basis and continue with med onc and ENT for surveillance. The patient was encouraged to call with any issues or questions.  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  Nevertheless, he will take Gabapentin via med/onc and follow with PT for further support. _____________________________________   Eppie Gibson, MD  This document serves as a record of services personally performed by Eppie Gibson, MD. It was created on her behalf by Maryla Morrow, a trained medical scribe. The creation of this record is based on the scribe's personal observations and the provider's statements to them. This document has been checked and approved by the attending provider.

## 2016-10-28 NOTE — Progress Notes (Signed)
Oncology Nurse Navigator Documentation  Met with patient during post-tmt follow-up appt with Dr. Isidore Moos.  He completed chemotherapy 07/03/2016 and RT 07/10/2016 for L tonsil carcinoma.  He arrives today to discuss 2/22 re-staging PET. He reported:  Using PEG occasionally for nutrition but otherwise eating/drinking orally.  Flushing daily.  Has resumed cigarette smoking.  He understands importance of cessation for optimal treatment outcome.  Again provided information/discussed opportunities for smoking cessation support:    Quit[employer] class/individual counseling at [employer] information  He understands I will coordinate follow-up with ENT [facility] for May. He understands to call me with needs/concerns.  Gayleen Orem, RN, BSN, [place] Neck Oncology Nurse [place] [facility] [place] [phone]

## 2016-10-29 ENCOUNTER — Other Ambulatory Visit: Payer: Self-pay | Admitting: Radiation Oncology

## 2016-10-29 DIAGNOSIS — Z1329 Encounter for screening for other suspected endocrine disorder: Secondary | ICD-10-CM

## 2016-10-29 DIAGNOSIS — C090 Malignant neoplasm of tonsillar fossa: Secondary | ICD-10-CM

## 2016-10-30 ENCOUNTER — Telehealth: Payer: Self-pay | Admitting: *Deleted

## 2016-10-30 NOTE — Telephone Encounter (Signed)
Oncology Nurse Navigator Documentation  Per Dr. Pearlie Oyster guidance, called [facility] ENT to arrange appointment.  Spoke with [facility]. Requested patient be contacted and routine post-RT follow-up appt arranged with Dr. Erik Obey in late May. She verbalized understanding.  Gayleen Orem, RN, BSN, [place] Neck Oncology Nurse [place] at [place] [phone]

## 2016-11-07 ENCOUNTER — Other Ambulatory Visit (HOSPITAL_BASED_OUTPATIENT_CLINIC_OR_DEPARTMENT_OTHER): Payer: Medicaid Other

## 2016-11-07 ENCOUNTER — Encounter: Payer: Self-pay | Admitting: Hematology and Oncology

## 2016-11-07 ENCOUNTER — Telehealth: Payer: Self-pay | Admitting: Hematology and Oncology

## 2016-11-07 ENCOUNTER — Ambulatory Visit (HOSPITAL_BASED_OUTPATIENT_CLINIC_OR_DEPARTMENT_OTHER): Payer: Medicaid Other | Admitting: Hematology and Oncology

## 2016-11-07 VITALS — BP 108/76 | HR 66 | Temp 98.1°F | Resp 17 | Ht 73.0 in | Wt 139.5 lb

## 2016-11-07 DIAGNOSIS — C090 Malignant neoplasm of tonsillar fossa: Secondary | ICD-10-CM

## 2016-11-07 DIAGNOSIS — R911 Solitary pulmonary nodule: Secondary | ICD-10-CM | POA: Diagnosis not present

## 2016-11-07 DIAGNOSIS — N183 Chronic kidney disease, stage 3 unspecified: Secondary | ICD-10-CM

## 2016-11-07 DIAGNOSIS — F1010 Alcohol abuse, uncomplicated: Secondary | ICD-10-CM | POA: Diagnosis not present

## 2016-11-07 DIAGNOSIS — R634 Abnormal weight loss: Secondary | ICD-10-CM | POA: Diagnosis not present

## 2016-11-07 DIAGNOSIS — D638 Anemia in other chronic diseases classified elsewhere: Secondary | ICD-10-CM | POA: Diagnosis not present

## 2016-11-07 DIAGNOSIS — Z1329 Encounter for screening for other suspected endocrine disorder: Secondary | ICD-10-CM

## 2016-11-07 DIAGNOSIS — T451X5A Adverse effect of antineoplastic and immunosuppressive drugs, initial encounter: Secondary | ICD-10-CM

## 2016-11-07 DIAGNOSIS — G620 Drug-induced polyneuropathy: Secondary | ICD-10-CM | POA: Diagnosis not present

## 2016-11-07 LAB — CBC WITH DIFFERENTIAL/PLATELET
BASO%: 0.2 % (ref 0.0–2.0)
Basophils Absolute: 0 10*3/uL (ref 0.0–0.1)
EOS%: 1.5 % (ref 0.0–7.0)
Eosinophils Absolute: 0.1 10*3/uL (ref 0.0–0.5)
HEMATOCRIT: 36.8 % — AB (ref 38.4–49.9)
HEMOGLOBIN: 12.7 g/dL — AB (ref 13.0–17.1)
LYMPH#: 0.7 10*3/uL — AB (ref 0.9–3.3)
LYMPH%: 9.8 % — ABNORMAL LOW (ref 14.0–49.0)
MCH: 34.8 pg — ABNORMAL HIGH (ref 27.2–33.4)
MCHC: 34.5 g/dL (ref 32.0–36.0)
MCV: 101 fL — ABNORMAL HIGH (ref 79.3–98.0)
MONO#: 0.7 10*3/uL (ref 0.1–0.9)
MONO%: 9.7 % (ref 0.0–14.0)
NEUT%: 78.8 % — ABNORMAL HIGH (ref 39.0–75.0)
NEUTROS ABS: 5.8 10*3/uL (ref 1.5–6.5)
Platelets: 195 10*3/uL (ref 140–400)
RBC: 3.64 10*6/uL — ABNORMAL LOW (ref 4.20–5.82)
RDW: 12.1 % (ref 11.0–14.6)
WBC: 7.4 10*3/uL (ref 4.0–10.3)

## 2016-11-07 LAB — COMPREHENSIVE METABOLIC PANEL
ALT: 33 U/L (ref 0–55)
ANION GAP: 9 meq/L (ref 3–11)
AST: 29 U/L (ref 5–34)
Albumin: 4.2 g/dL (ref 3.5–5.0)
Alkaline Phosphatase: 78 U/L (ref 40–150)
BILIRUBIN TOTAL: 0.37 mg/dL (ref 0.20–1.20)
BUN: 25.5 mg/dL (ref 7.0–26.0)
CALCIUM: 10.1 mg/dL (ref 8.4–10.4)
CHLORIDE: 105 meq/L (ref 98–109)
CO2: 27 meq/L (ref 22–29)
CREATININE: 1.5 mg/dL — AB (ref 0.7–1.3)
EGFR: 57 mL/min/{1.73_m2} — AB (ref >90)
Glucose: 78 mg/dl (ref 70–140)
Potassium: 4.3 mEq/L (ref 3.5–5.1)
Sodium: 140 mEq/L (ref 136–145)
Total Protein: 7.9 g/dL (ref 6.4–8.3)

## 2016-11-07 LAB — TSH: TSH: 0.674 m[IU]/L (ref 0.320–4.118)

## 2016-11-07 NOTE — Progress Notes (Signed)
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 Chronic small-vessel ischemic changes of the cerebral hemispheric white matter.       04/04/2016 Imaging    CT neck showed left tonsillar mass with diameter of 19 x 24 mm consistent with 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 necrosis. Suspicious level 5/supraclavicular nodes on the left. Scar type density in the right upper lobe is more prominent than was seen on a CT scan of the chest 10/06/2014      04/21/2016 PET scan    Asymmetric hypermetabolic soft tissue prominence in left palatine tonsil, consistent with known primary left tonsillar squamous cell Carcinoma. Left level 2 cervical lymphadenopathy, consistent with metastatic disease. No evidence of metastatic disease within the chest, abdomen, or pelvis. Stable 14 mm ground-glass and part solid nodular opacity in right lung apex compared to previous CT in 2016. This shows low-grade metabolic activity. Low-grade bronchogenic adenocarcinoma cannot be excluded. Consider surgical resection versus continued followup by CT in 12 months      05/17/2016 Procedure    Successful placement of a 20 French pull  through gastrostomy tube & right IJ approach [facility] [place].      05/22/2016 - 07/03/2016 Chemotherapy    He received 3 doses of high dose cisplatin with radiation       05/23/2016 - 07/10/2016 Radiation Therapy    Rec'd Helical IMRT:  Left tonsil and bilateral neck / 70 Gy in 35 fractions to gross disease, 63 Gy in 35 fractions to high risk nodal echelons, and 56 Gy in 35 fractions to intermediate risk nodal echelons.          10/04/2016 - 10/07/2016 Hospital Admission    He was admitted due to atypical chest pain from illicit drug use. CT scan showed possible atypical pneumonia versus radiation pneumonitis      10/04/2016 Imaging    Ct scan showed no evidence of pulmonary embolus. 2. Patchy bilateral airspace opacities, at the upper lobes, compatible with multifocal pneumonia. This corresponds to the finding on recent chest radiograph. 3. Previously noted ground-glass and solid nodule at the right lung apex demonstrates increased solid components. The solid component now measures 1.2 cm. 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 Tissue diagnosis would be helpful, as deemed clinically appropriate.      10/26/2016 PET scan    Slight increasing solid component of a right apical lung lesion but no concerning hypermetabolism. Recommend continued surveillance. 2. Bilateral patchy apical airspace opacities are mild to moderately hypermetabolic and likely reflect infection. Recommend short-term followup noncontrast chest CT in 3 months to reassess. 3. No mediastinal or hilar mass or adenopathy. 4. No significant findings in the abdomen/pelvis. 5. Stable lucencies throughout  the bony pelvis but no hypermetabolism to suggest metastasis.       INTERVAL HISTORY: Please see below for problem oriented charting. He returns for further follow-up. He denies cough, chest pain or shortness of breath. He has lost some weight because of poor appetite. He  denies dysphagia. He had resume alcohol intake.  He denies drug abuse recently.  He has been smoking periodically. The patient is somewhat noncompliant. He was not taking his gabapentin for neuropathy or prednisone therapy for appetite stimulant. Overall, he had no energy.  He felt recent flare of peripheral neuropathy.  REVIEW OF SYSTEMS:   Constitutional: Denies fevers, chills  Eyes: Denies blurriness of vision Ears, nose, mouth, throat, and face: Denies mucositis or sore throat Respiratory: Denies cough, dyspnea or wheezes Cardiovascular: Denies palpitation, chest discomfort or lower extremity swelling Gastrointestinal:  Denies nausea, heartburn or change in bowel habits Skin: Denies abnormal skin rashes Lymphatics: Denies new lymphadenopathy or easy bruising Behavioral/Psych: Mood is stable, no new changes  All other systems were reviewed with the patient and are negative.  I have reviewed the past medical history, past surgical history, social history and family history with the patient and they are unchanged from previous note.  ALLERGIES:  has No Known Allergies.  MEDICATIONS:  Current Outpatient Prescriptions  Medication Sig Dispense Refill  . aspirin EC 81 MG tablet Take 81 mg by mouth daily.    . predniSONE (DELTASONE) 10 MG tablet Take 1 tablet (10 mg total) by mouth daily with breakfast. 30 tablet 0  . PROAIR HFA 108 (90 Base) MCG/ACT inhaler   3  . gabapentin (NEURONTIN) 300 MG capsule Take 1 capsule (300 mg total) by mouth 3 (three) times daily. (Patient not taking: Reported on 11/07/2016) 90 capsule 1   Current Facility-Administered Medications  Medication Dose Route Frequency Provider Last Rate Last Dose  . dexamethasone (DECADRON) injection 10 mg  10 mg Intravenous Once Heath Lark, MD      . LORazepam (ATIVAN) injection 0.5 mg  0.5 mg Intravenous Once Heath Lark, MD       Facility-Administered Medications Ordered in Other Visits  Medication Dose Route Frequency  Provider Last Rate Last Dose  . sodium chloride flush (NS) 0.9 % injection 10 mL  10 mL Intracatheter PRN Heath Lark, MD   10 mL at 05/24/16 1615    PHYSICAL EXAMINATION: ECOG PERFORMANCE STATUS: 1 - Symptomatic but completely ambulatory  Vitals:   11/07/16 1012  BP: 108/76  Pulse: 66  Resp: 17  Temp: 98.1 F (36.7 C)   Filed Weights   11/07/16 1012  Weight: 139 lb 8 oz (63.3 kg)    GENERAL:alert, no distress and comfortable. He looks thin SKIN: skin color, texture, turgor are normal, no rashes or significant lesions EYES: normal, Conjunctiva are pink and non-injected, sclera clear OROPHARYNX:no exudate, no erythema and lips, buccal mucosa, and tongue normal  NECK: supple, thyroid normal size, non-tender, without nodularity LYMPH:  no palpable lymphadenopathy in the cervical, axillary or inguinal LUNGS: clear to auscultation and percussion with normal breathing effort HEART: regular rate & rhythm and no murmurs and no lower extremity edema ABDOMEN:abdomen soft, non-tender and normal bowel sounds Musculoskeletal:no cyanosis of digits and no clubbing  NEURO: alert & oriented x 3 with fluent speech, no focal motor/sensory deficits  LABORATORY DATA:  I have reviewed the data as listed    Component Value Date/Time   NA 140 11/07/2016 0949   K 4.3 11/07/2016 0949   CL 99 (L)  10/05/2016 0110   CO2 27 11/07/2016 0949   GLUCOSE 78 11/07/2016 0949   BUN 25.5 11/07/2016 0949   CREATININE 1.5 (H) 11/07/2016 0949   CALCIUM 10.1 11/07/2016 0949   PROT 7.9 11/07/2016 0949   ALBUMIN 4.2 11/07/2016 0949   AST 29 11/07/2016 0949   ALT 33 11/07/2016 0949   ALKPHOS 78 11/07/2016 0949   BILITOT 0.37 11/07/2016 0949   GFRNONAA 56 (L) 10/05/2016 0110   GFRAA >60 10/05/2016 0110    No results found for: SPEP, UPEP  Lab Results  Component Value Date   WBC 7.4 11/07/2016   NEUTROABS 5.8 11/07/2016   HGB 12.7 (L) 11/07/2016   HCT 36.8 (L) 11/07/2016   MCV 101.0 (H) 11/07/2016    PLT 195 11/07/2016      Chemistry      Component Value Date/Time   NA 140 11/07/2016 0949   K 4.3 11/07/2016 0949   CL 99 (L) 10/05/2016 0110   CO2 27 11/07/2016 0949   BUN 25.5 11/07/2016 0949   CREATININE 1.5 (H) 11/07/2016 0949      Component Value Date/Time   CALCIUM 10.1 11/07/2016 0949   ALKPHOS 78 11/07/2016 0949   AST 29 11/07/2016 0949   ALT 33 11/07/2016 0949   BILITOT 0.37 11/07/2016 0949       RADIOGRAPHIC STUDIES: I have personally reviewed the radiological images as listed and agreed with the findings in the report. Nm Pet Image Restag (ps) Skull Base To Thigh  Result Date: 10/26/2016 CLINICAL DATA:  Subsequent treatment strategy for head and neck cancer. Enlarging right upper lobe lesion. EXAM: NUCLEAR MEDICINE PET SKULL BASE TO THIGH TECHNIQUE: 7.6 mCi F-18 FDG was injected intravenously. Full-ring PET imaging was performed from the skull base to thigh after the radiotracer. CT data was obtained and used for attenuation correction and anatomic localization. FASTING BLOOD GLUCOSE:  Value: 88 mg/dl COMPARISON:  Chest CT 10/04/2016.  PET-CT 04/21/2016 FINDINGS: NECK No hypermetabolic lymph nodes in the neck. CHEST No hypermetabolic mediastinal or hilar nodes. The right apical lesion is sub solid with a slightly enlarged solid portion. However, this is not metabolically active on the PET scan with SUV max of 1.99. Multiple ill-defined airspace opacities in the upper lobes bilaterally persists since the recent CT scan and are mildly hypermetabolic. The subpleural airspace process at the left lung apex has an SUV max of 5.6. A more medial density appears smaller and more 6 seconds on today's examination it measures 14 x 13 mm. Previously measured 18 x 17 mm. SUV max is 3 point Anterior right apical density is mildly hypermetabolic measuring 3.6. Stable emphysematous changes and pulmonary scarring. No lower lobe lung lesions or pleural effusion. ABDOMEN/PELVIS No abnormal  hypermetabolic activity within the liver, pancreas, adrenal glands, or spleen. No hypermetabolic lymph nodes in the abdomen or pelvis. A feeding gastrostomy tube is in place. SKELETON No focal hypermetabolic activity to suggest skeletal metastasis. Stable numerous lucencies in the the pelvis but no hypermetabolism. IMPRESSION: 1. Slight increasing solid component of a right apical lung lesion but no concerning hypermetabolism. Recommend continued surveillance. 2. Bilateral patchy apical airspace opacities are mild to moderately hypermetabolic and likely reflect infection. Recommend short-term followup noncontrast chest CT in 3 months to reassess. 3. No mediastinal or hilar mass or adenopathy. 4. No significant findings in the abdomen/pelvis. 5. Stable lucencies throughout the bony pelvis but no hypermetabolism to suggest metastasis. Electronically Signed   By: Marijo Sanes M.D.   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 The abnormal changes in the upper lung is not consistent with metastatic cancer. I plan to repeat imaging study again in a few months. We will arrange for PEG tube and port removal next week. He needs a follow-up with ENT service in the near future. I will continue to see him on the regular basis for supportive care  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 The patient denies recent history of bleeding such as epistaxis, hematuria or hematochezia. He is asymptomatic from the anemia. We will observe for now.  He does not require transfusion now.    Chronic kidney disease, stage III (moderate) Likely due to dehydration and recent cisplatin I continue to encourage him increase fluids as tolerated  Peripheral neuropathy due to chemotherapy [facility]) He had recent flare of peripheral neuropathy.  I suspect it is related to recent alcohol intake. He is also not taking  gabapentin consistently. I spent some time discussing with the patient some of the risk of worsening neuropathy including cold weather, extraneous physical activity or alcohol intake. The patient is interested to try gabapentin again.  I told him to quit alcohol intake  Alcohol abuse He had recent alcohol intake. I suspect he could have caused flare of his neuropathy. I warn him that alcohol could increase his risk of recurrence of cancer.  Lesion of right lung The lesion seen in the upper lobe of the lung could be related to radiation induced changes and atypical infection. I would repeat imaging study again in 3 months  Unintentional weight loss He has poor appetite due to dysgeusia. We discussed low-dose steroid therapy that might help with COPD, appetite and energy. The patient is very reluctant to try. He has taken it recently for 7 days and it was not helpful. I recommend he increase the dose of 15-20 mg daily. I plan to see him again in 2 weeks to assess response to treatment. If this is not helpful, we can consider trying other medications such as mirtazapine.   Orders Placed This Encounter  Procedures  . IR Removal Tun Access W/ Port W/O FL    Standing Status:   Future    Standing Expiration Date:   01/07/2018    Order Specific Question:   Reason for exam:    Answer:   no need port    Order Specific Question:   Preferred Imaging Location?    Answer:   [place] TUBE REMOVAL    Standing Status:   Future    Standing Expiration Date:   01/07/2018    Order Specific Question:   Reason for Exam (SYMPTOM  OR DIAGNOSIS REQUIRED)    Answer:   feeding tube no longer needed    Order Specific Question:   Preferred Imaging Location?    Answer:   [facility]   All questions were answered. The patient knows to call the clinic with any problems, questions or concerns. No barriers to learning was detected. I spent 25 minutes counseling the patient  face to face. The total time spent in the appointment was 40 minutes and more than 50% was on counseling and review of test results     Heath Lark, MD 11/07/2016 10:43 AM

## 2016-11-07 NOTE — Assessment & Plan Note (Signed)
This is likely anemia of chronic disease. The patient denies recent history of bleeding such as epistaxis, hematuria or hematochezia. He is asymptomatic from the anemia. We will observe for now.  He does not require transfusion now.   

## 2016-11-07 NOTE — Assessment & Plan Note (Signed)
I have reviewed recent imaging study. The abnormal changes in the upper lung is not consistent with metastatic cancer. I plan to repeat imaging study again in a few months. We will arrange for PEG tube and port removal next week. He needs a follow-up with ENT service in the near future. I will continue to see him on the regular basis for supportive care

## 2016-11-07 NOTE — Assessment & Plan Note (Signed)
Likely due to dehydration and recent cisplatin I continue to encourage him increase fluids as tolerated 

## 2016-11-07 NOTE — Assessment & Plan Note (Signed)
He has poor appetite due to dysgeusia. We discussed low-dose steroid therapy that might help with COPD, appetite and energy. The patient is very reluctant to try. He has taken it recently for 7 days and it was not helpful. I recommend he increase the dose of 15-20 mg daily. I plan to see him again in 2 weeks to assess response to treatment. If this is not helpful, we can consider trying other medications such as mirtazapine.

## 2016-11-07 NOTE — Assessment & Plan Note (Signed)
He had recent flare of peripheral neuropathy.  I suspect it is related to recent alcohol intake. He is also not taking gabapentin consistently. I spent some time discussing with the patient some of the risk of worsening neuropathy including cold weather, extraneous physical activity or alcohol intake. The patient is interested to try gabapentin again.  I told him to quit alcohol intake

## 2016-11-07 NOTE — Assessment & Plan Note (Signed)
The lesion seen in the upper lobe of the lung could be related to radiation induced changes and atypical infection. I would repeat imaging study again in 3 months

## 2016-11-07 NOTE — Assessment & Plan Note (Signed)
He had recent alcohol intake. I suspect he could have caused flare of his neuropathy. I warn him that alcohol could increase his risk of recurrence of cancer. 

## 2016-11-07 NOTE — Telephone Encounter (Signed)
Gave patient AVS and calender per 11/07/2016 los

## 2016-11-13 ENCOUNTER — Encounter (HOSPITAL_COMMUNITY): Payer: Self-pay

## 2016-11-13 ENCOUNTER — Other Ambulatory Visit: Payer: Self-pay | Admitting: *Deleted

## 2016-11-13 ENCOUNTER — Ambulatory Visit (HOSPITAL_COMMUNITY)
Admission: RE | Admit: 2016-11-13 | Discharge: 2016-11-13 | Disposition: A | Discharge status: Home / Self Care | Payer: Medicaid Other | Source: Ambulatory Visit | Attending: Hematology and Oncology | Admitting: Hematology and Oncology

## 2016-11-13 DIAGNOSIS — C099 Malignant neoplasm of tonsil, unspecified: Secondary | ICD-10-CM

## 2016-11-13 DIAGNOSIS — Z8589 Personal history of malignant neoplasm of other organs and systems: Secondary | ICD-10-CM | POA: Diagnosis not present

## 2016-11-13 DIAGNOSIS — Z452 Encounter for adjustment and management of vascular access device: Secondary | ICD-10-CM | POA: Insufficient documentation

## 2016-11-13 DIAGNOSIS — Z431 Encounter for attention to gastrostomy: Secondary | ICD-10-CM | POA: Diagnosis present

## 2016-11-13 DIAGNOSIS — C090 Malignant neoplasm of tonsillar fossa: Secondary | ICD-10-CM

## 2016-11-13 HISTORY — PX: IR GENERIC HISTORICAL: IMG1180011

## 2016-11-13 LAB — PROTIME-INR
INR: 0.96
Prothrombin Time: 12.8 seconds (ref 11.4–15.2)

## 2016-11-13 MED ORDER — LIDOCAINE-EPINEPHRINE (PF) 2 %-1:200000 IJ SOLN
INTRAMUSCULAR | Status: DC | PRN
  Administered 2016-11-13: 10 mL via INTRADERMAL

## 2016-11-13 MED ORDER — LIDOCAINE-EPINEPHRINE (PF) 2 %-1:200000 IJ SOLN
INTRAMUSCULAR | Status: AC
  Filled 2016-11-13: qty 20

## 2016-11-13 MED ORDER — LIDOCAINE VISCOUS 2 % MT SOLN
OROMUCOSAL | Status: AC
  Filled 2016-11-13: qty 15

## 2016-11-13 MED ORDER — CEFAZOLIN SODIUM-DEXTROSE 2-4 GM/100ML-% IV SOLN
INTRAVENOUS | Status: AC
  Administered 2016-11-13: 2000 mg via INTRAVENOUS
  Filled 2016-11-13: qty 100

## 2016-11-13 MED ORDER — CEFAZOLIN SODIUM-DEXTROSE 2-4 GM/100ML-% IV SOLN
2.0000 g | Freq: Once | INTRAVENOUS | Status: AC
  Administered 2016-11-13: 2000 mg via INTRAVENOUS

## 2016-11-13 MED ORDER — CEFAZOLIN (ANCEF) 1 G IV SOLR: 2.0000 g | INTRAVENOUS | Status: DC

## 2016-11-13 NOTE — Discharge Instructions (Signed)
Sutured Wound Care  Sutures are stitches that can be used to close wounds. Taking care of your wound properly can help to prevent pain and infection. It can also help your wound to heal more quickly.  How is this treated?  Wound Care  · Keep the wound clean and dry.  · If you were given a bandage (dressing), you should change it at least once per day or as directed by your health care provider. You should also change it if it becomes wet or dirty.  · Keep the wound completely dry for the first 24 hours or as directed by your health care provider. After that time, you may shower or bathe. However, make sure that the wound is not soaked in water until the sutures have been removed.  · Clean the wound one time each day or as directed by your health care provider.  ? Wash the wound with soap and water.  ? Rinse the wound with water to remove all soap.  ? Pat the wound dry with a clean towel. Do not rub the wound.  · After cleaning the wound, apply a thin layer of antibiotic ointment as directed by your health care provider. This will help to prevent infection and keep the dressing from sticking to the wound.  · Have the sutures removed as directed by your health care provider.  General Instructions  · Take or apply medicines only as directed by your health care provider.  · To help prevent scarring, make sure to cover your wound with sunscreen whenever you are outside after the sutures are removed and the wound is healed. Make sure to wear a sunscreen of at least 30 SPF.  · If you were prescribed an antibiotic medicine or ointment, finish all of it even if you start to feel better.  · Do not scratch or pick at the wound.  · Keep all follow-up visits as directed by your health care provider. This is important.  · Check your wound every day for signs of infection. Watch for:  ? Redness, swelling, or pain.  ? Fluid, blood, or pus.  · Raise (elevate) the injured area above the level of your heart while you are sitting or  lying down, if possible.  · Avoid stretching your wound.  · Drink enough fluids to keep your urine clear or pale yellow.  Contact a health care provider if:  · You received a tetanus shot and you have swelling, severe pain, redness, or bleeding at the injection site.  · You have a fever.  · A wound that was closed breaks open.  · You notice a bad smell coming from the wound.  · You notice something coming out of the wound, such as wood or glass.  · Your pain is not controlled with medicine.  · You have increased redness, swelling, or pain at the site of your wound.  · You have fluid, blood, or pus coming from your wound.  · You notice a change in the color of your skin near your wound.  · You need to change the dressing frequently due to fluid, blood, or pus draining from the wound.  · You develop a new rash.  · You develop numbness around the wound.  Get help right away if:  · You develop severe swelling around the injury site.  · Your pain suddenly increases and is severe.  · You develop painful lumps near the wound or on skin that is anywhere on your body.  ·   You have a red streak going away from your wound.  · The wound is on your hand or foot and you cannot properly move a finger or toe.  · The wound is on your hand or foot and you notice that your fingers or toes look pale or bluish.  This information is not intended to replace advice given to you by your health care provider. Make sure you discuss any questions you have with your health care provider.  Document Released: 09/28/2004 Document Revised: 01/27/2016 Document Reviewed: 04/02/2013  Elsevier Interactive Patient Education © 2017 [employer]      ==============================

## 2016-11-13 NOTE — Progress Notes (Signed)
Pt discharged from radiology department per radiology staff after procedure.

## 2016-11-13 NOTE — Procedures (Signed)
Head and neck ca  Removal RT IJ POWER PORT Removal 20 fr Gtube  No comp Stable EBL 0 FULL REPORT IN PACS

## 2016-11-14 ENCOUNTER — Ambulatory Visit: Payer: Medicaid Other | Attending: Radiation Oncology

## 2016-11-14 ENCOUNTER — Ambulatory Visit: Payer: Medicaid Other | Admitting: Physical Therapy

## 2016-11-14 ENCOUNTER — Encounter: Payer: Self-pay | Admitting: Nutrition

## 2016-11-20 ENCOUNTER — Telehealth: Payer: Self-pay | Admitting: *Deleted

## 2016-11-20 NOTE — Telephone Encounter (Addendum)
Oncology Nurse Navigator Documentation  In follow-up to patient's expressed interest in [facility] program, faxed Medical Clearance form signed by Dr. Isidore Moos to Heron Nay, program coordinator. Notification of successful fax transmission received.  He again apologized for missing last week's H&N MDC d/t discomfort from previous day's PEG removal.  He indicated he wants follow-up with Nutrition and SLP.  I indicated will attempt to coordinate appts on an upcoming Monday or alternatively appts will be scheduled at clinicians respective locations.  He voiced understanding.  Gayleen Orem, RN, BSN, [place] Oncology Nurse [place] at [place] [phone]

## 2016-11-22 ENCOUNTER — Ambulatory Visit: Payer: Self-pay | Admitting: Hematology and Oncology

## 2016-11-23 ENCOUNTER — Telehealth: Payer: Self-pay

## 2016-11-23 NOTE — Telephone Encounter (Signed)
Patient called and left message that he missed his appt with Dr. Alvy Bimler and needs to reschedule.

## 2016-11-23 NOTE — Telephone Encounter (Signed)
Called patient and told him a scheduler would be calling him to reschedule appt.

## 2016-11-23 NOTE — Telephone Encounter (Signed)
I will place scheduling msg 

## 2016-11-24 ENCOUNTER — Telehealth: Payer: Self-pay | Admitting: Hematology and Oncology

## 2016-11-24 NOTE — Telephone Encounter (Signed)
Called patient to inform his of next scheduled appointments.

## 2016-12-04 ENCOUNTER — Ambulatory Visit: Payer: Medicaid Other | Attending: Radiation Oncology

## 2016-12-04 ENCOUNTER — Ambulatory Visit: Payer: Self-pay

## 2016-12-05 ENCOUNTER — Other Ambulatory Visit (HOSPITAL_BASED_OUTPATIENT_CLINIC_OR_DEPARTMENT_OTHER): Payer: Medicaid Other

## 2016-12-05 ENCOUNTER — Telehealth: Payer: Self-pay | Admitting: *Deleted

## 2016-12-05 ENCOUNTER — Telehealth: Payer: Self-pay | Admitting: Hematology and Oncology

## 2016-12-05 ENCOUNTER — Ambulatory Visit (HOSPITAL_BASED_OUTPATIENT_CLINIC_OR_DEPARTMENT_OTHER): Payer: Medicaid Other | Admitting: Hematology and Oncology

## 2016-12-05 ENCOUNTER — Encounter: Payer: Self-pay | Admitting: Hematology and Oncology

## 2016-12-05 ENCOUNTER — Ambulatory Visit: Payer: Medicaid Other | Admitting: Nutrition

## 2016-12-05 VITALS — BP 109/69 | HR 77 | Temp 98.7°F | Resp 18 | Ht 73.0 in | Wt 142.7 lb

## 2016-12-05 DIAGNOSIS — R918 Other nonspecific abnormal finding of lung field: Secondary | ICD-10-CM | POA: Diagnosis not present

## 2016-12-05 DIAGNOSIS — M2669 Other specified disorders of temporomandibular joint: Secondary | ICD-10-CM

## 2016-12-05 DIAGNOSIS — F17200 Nicotine dependence, unspecified, uncomplicated: Secondary | ICD-10-CM | POA: Diagnosis not present

## 2016-12-05 DIAGNOSIS — M26649 Arthritis of unspecified temporomandibular joint: Secondary | ICD-10-CM

## 2016-12-05 DIAGNOSIS — F1010 Alcohol abuse, uncomplicated: Secondary | ICD-10-CM

## 2016-12-05 DIAGNOSIS — C090 Malignant neoplasm of tonsillar fossa: Secondary | ICD-10-CM

## 2016-12-05 DIAGNOSIS — N179 Acute kidney failure, unspecified: Secondary | ICD-10-CM | POA: Diagnosis not present

## 2016-12-05 DIAGNOSIS — T451X5A Adverse effect of antineoplastic and immunosuppressive drugs, initial encounter: Secondary | ICD-10-CM

## 2016-12-05 DIAGNOSIS — G620 Drug-induced polyneuropathy: Secondary | ICD-10-CM | POA: Diagnosis not present

## 2016-12-05 LAB — CBC WITH DIFFERENTIAL/PLATELET
BASO%: 0.3 % (ref 0.0–2.0)
BASOS ABS: 0 10*3/uL (ref 0.0–0.1)
EOS%: 0.4 % (ref 0.0–7.0)
Eosinophils Absolute: 0 10*3/uL (ref 0.0–0.5)
HEMATOCRIT: 39 % (ref 38.4–49.9)
HEMOGLOBIN: 13.5 g/dL (ref 13.0–17.1)
LYMPH#: 0.7 10*3/uL — AB (ref 0.9–3.3)
LYMPH%: 13 % — ABNORMAL LOW (ref 14.0–49.0)
MCH: 34.6 pg — AB (ref 27.2–33.4)
MCHC: 34.7 g/dL (ref 32.0–36.0)
MCV: 99.6 fL — AB (ref 79.3–98.0)
MONO#: 0.5 10*3/uL (ref 0.1–0.9)
MONO%: 9.6 % (ref 0.0–14.0)
NEUT#: 4 10*3/uL (ref 1.5–6.5)
NEUT%: 76.7 % — ABNORMAL HIGH (ref 39.0–75.0)
Platelets: 182 10*3/uL (ref 140–400)
RBC: 3.92 10*6/uL — ABNORMAL LOW (ref 4.20–5.82)
RDW: 12.4 % (ref 11.0–14.6)
WBC: 5.2 10*3/uL (ref 4.0–10.3)

## 2016-12-05 LAB — COMPREHENSIVE METABOLIC PANEL
ALT: 35 U/L (ref 0–55)
ANION GAP: 9 meq/L (ref 3–11)
AST: 33 U/L (ref 5–34)
Albumin: 4.2 g/dL (ref 3.5–5.0)
Alkaline Phosphatase: 69 U/L (ref 40–150)
BUN: 27.6 mg/dL — ABNORMAL HIGH (ref 7.0–26.0)
CHLORIDE: 103 meq/L (ref 98–109)
CO2: 28 meq/L (ref 22–29)
Calcium: 10.2 mg/dL (ref 8.4–10.4)
Creatinine: 1.6 mg/dL — ABNORMAL HIGH (ref 0.7–1.3)
EGFR: 55 mL/min/{1.73_m2} — AB (ref >90)
Glucose: 151 mg/dl — ABNORMAL HIGH (ref 70–140)
POTASSIUM: 5.6 meq/L — AB (ref 3.5–5.1)
Sodium: 140 mEq/L (ref 136–145)
Total Bilirubin: 0.4 mg/dL (ref 0.20–1.20)
Total Protein: 7.8 g/dL (ref 6.4–8.3)

## 2016-12-05 NOTE — Assessment & Plan Note (Signed)
Likely due to dehydration and recent cisplatin I continue to encourage him increase fluids as tolerated and avoid alcohol intake The mild hyperkalemia is likely related to renal failure

## 2016-12-05 NOTE — Assessment & Plan Note (Signed)
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

## 2016-12-05 NOTE — Assessment & Plan Note (Signed)
He had recent flare of peripheral neuropathy.  I suspect it is related to recent alcohol intake. Reinforced the importance of taking gabapentin regularly and avoid alcohol

## 2016-12-05 NOTE — Assessment & Plan Note (Signed)
He had recent alcohol intake. I suspect he could have caused flare of his neuropathy. I warn him that alcohol could increase his risk of recurrence of cancer.

## 2016-12-05 NOTE — Telephone Encounter (Signed)
-----   Message from Heath Lark, MD sent at 12/05/2016 11:11 AM EDT ----- Regarding: abnormal K and Cr pls call and let him know he is likely dehydrated from alcohol intake Please stop alcohol and drink more fluids

## 2016-12-05 NOTE — Telephone Encounter (Signed)
Gave patient AVS and calender per 12/05/2016 los.  

## 2016-12-05 NOTE — Assessment & Plan Note (Signed)
I have reviewed recent imaging study. The abnormal changes in the upper lung is not consistent with metastatic cancer. I plan to repeat imaging study again next month He needs a follow-up with ENT service in the near future. I will continue to see him on the regular basis for supportive care

## 2016-12-05 NOTE — Progress Notes (Signed)
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 Chronic small-vessel ischemic changes of the cerebral hemispheric white matter.       04/04/2016 Imaging    CT neck showed left tonsillar mass with diameter of 19 x 24 mm consistent with 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 necrosis. Suspicious level 5/supraclavicular nodes on the left. Scar type density in the right upper lobe is more prominent than was seen on a CT scan of the chest 10/06/2014      04/21/2016 PET scan    Asymmetric hypermetabolic soft tissue prominence in left palatine tonsil, consistent with known primary left tonsillar squamous cell Carcinoma. Left level 2 cervical lymphadenopathy, consistent with metastatic disease. No evidence of metastatic disease within the chest, abdomen, or pelvis. Stable 14 mm ground-glass and part solid nodular opacity in right lung apex compared to previous CT in 2016. This shows low-grade metabolic activity. Low-grade bronchogenic adenocarcinoma cannot be excluded. Consider surgical resection versus continued followup by CT in 12 months      05/17/2016 Procedure    Successful placement of a 20 French pull  through gastrostomy tube & right IJ approach [employer].      05/22/2016 - 07/03/2016 Chemotherapy    He received 3 doses of high dose cisplatin with radiation       05/23/2016 - 07/10/2016 Radiation Therapy    Rec'd Helical IMRT:  Left tonsil and bilateral neck / 70 Gy in 35 fractions to gross disease, 63 Gy in 35 fractions to high risk nodal echelons, and 56 Gy in 35 fractions to intermediate risk nodal echelons.          10/04/2016 - 10/07/2016 Hospital Admission    He was admitted due to atypical chest pain from illicit drug use. CT scan showed possible atypical pneumonia versus radiation pneumonitis      10/04/2016 Imaging    Ct scan showed no evidence of pulmonary embolus. 2. Patchy bilateral airspace opacities, at the upper lobes, compatible with multifocal pneumonia. This corresponds to the finding on recent chest radiograph. 3. Previously noted ground-glass and solid nodule at the right lung apex demonstrates increased solid components. The solid component now measures 1.2 cm. 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 Tissue diagnosis would be helpful, as deemed clinically appropriate.      10/26/2016 PET scan    Slight increasing solid component of a right apical lung lesion but no concerning hypermetabolism. Recommend continued surveillance. 2. Bilateral patchy apical airspace opacities are mild to moderately hypermetabolic and likely reflect infection. Recommend short-term followup noncontrast chest CT in 3 months to reassess. 3. No mediastinal or hilar mass or adenopathy. 4. No significant findings in the abdomen/pelvis. 5. Stable lucencies throughout  the bony pelvis but no hypermetabolism to suggest metastasis.      11/13/2016 Procedure    Successful right IJ vein Port-A-Cath explant.      11/13/2016 Procedure    Uncomplicated gastrostomy removal       INTERVAL HISTORY: Please see below for problem oriented charting. He  is seen as part of his follow-up The prednisone has helped him with some weight gain He continues to have poor appetite He denies dysphagia He started to drink several beers a day and smoked 1-2 cigarettes per day He complained of some left jaw pain Denies new lymphadenopathy No recent fever, chills or cough Complain of neuropathic pain  REVIEW OF SYSTEMS:   Constitutional: Denies fevers, chills or abnormal weight loss Eyes: Denies blurriness of vision Ears, nose, mouth, throat, and face: Denies mucositis or sore throat Respiratory: Denies cough, dyspnea or wheezes Cardiovascular: Denies palpitation, chest discomfort or lower extremity swelling Gastrointestinal:  Denies nausea, heartburn or change in bowel habits Skin: Denies abnormal skin rashes Lymphatics: Denies new lymphadenopathy or easy bruising Neurological:Denies numbness, tingling or new weaknesses Behavioral/Psych: Mood is stable, no new changes  All other systems were reviewed with the patient and are negative.  I have reviewed the past medical history, past surgical history, social history and family history with the patient and they are unchanged from previous note.  ALLERGIES:  has No Known Allergies.  MEDICATIONS:  Current Outpatient Prescriptions  Medication Sig Dispense Refill  . aspirin EC 81 MG tablet Take 81 mg by mouth daily.    Marland Kitchen gabapentin (NEURONTIN) 300 MG capsule Take 1 capsule (300 mg total) by mouth 3 (three) times daily. 90 capsule 1  . predniSONE (DELTASONE) 10 MG tablet Take 1 tablet (10 mg total) by mouth daily with breakfast. 30 tablet 0  . PROAIR HFA 108 (90 Base) MCG/ACT inhaler   3   Current Facility-Administered Medications  Medication Dose Route Frequency Provider Last Rate Last Dose  . dexamethasone (DECADRON) injection 10 mg  10 mg Intravenous Once Heath Lark, MD      . LORazepam (ATIVAN) injection 0.5 mg  0.5 mg Intravenous Once Heath Lark, MD       Facility-Administered Medications  Ordered in Other Visits  Medication Dose Route Frequency Provider Last Rate Last Dose  . sodium chloride flush (NS) 0.9 % injection 10 mL  10 mL Intracatheter PRN Heath Lark, MD   10 mL at 05/24/16 1615    PHYSICAL EXAMINATION: ECOG PERFORMANCE STATUS: 1 - Symptomatic but completely ambulatory  Vitals:   12/05/16 1035  BP: 109/69  Pulse: 77  Resp: 18  Temp: 98.7 F (37.1 C)   Filed Weights   12/05/16 1035  Weight: 142 lb 11.2 oz (64.7 kg)    GENERAL:alert, no distress and comfortable SKIN: skin color, texture, turgor are normal, no rashes or significant lesions EYES: normal, Conjunctiva are pink and non-injected, sclera clear OROPHARYNX:no exudate, no erythema and lips, buccal mucosa, and tongue normal  NECK: supple, thyroid normal size, non-tender, without nodularity LYMPH:  no palpable lymphadenopathy in the cervical, axillary or inguinal LUNGS: clear to auscultation and percussion with normal breathing effort HEART: regular rate & rhythm and no murmurs and no lower extremity edema ABDOMEN:abdomen soft, non-tender and normal bowel sounds Musculoskeletal:no cyanosis of digits and no clubbing  NEURO: alert & oriented x 3 with fluent speech, no focal motor/sensory deficits  LABORATORY DATA:  I have reviewed the data as listed    Component Value Date/Time   NA  140 12/05/2016 1026   K 5.6 (H) 12/05/2016 1026   CL 99 (L) 10/05/2016 0110   CO2 28 12/05/2016 1026   GLUCOSE 151 (H) 12/05/2016 1026   BUN 27.6 (H) 12/05/2016 1026   CREATININE 1.6 (H) 12/05/2016 1026   CALCIUM 10.2 12/05/2016 1026   PROT 7.8 12/05/2016 1026   ALBUMIN 4.2 12/05/2016 1026   AST 33 12/05/2016 1026   ALT 35 12/05/2016 1026   ALKPHOS 69 12/05/2016 1026   BILITOT 0.40 12/05/2016 1026   GFRNONAA 56 (L) 10/05/2016 0110   GFRAA >60 10/05/2016 0110    No results found for: SPEP, UPEP  Lab Results  Component Value Date   WBC 5.2 12/05/2016   NEUTROABS 4.0 12/05/2016   HGB 13.5 12/05/2016    HCT 39.0 12/05/2016   MCV 99.6 (H) 12/05/2016   PLT 182 12/05/2016      Chemistry      Component Value Date/Time   NA 140 12/05/2016 1026   K 5.6 (H) 12/05/2016 1026   CL 99 (L) 10/05/2016 0110   CO2 28 12/05/2016 1026   BUN 27.6 (H) 12/05/2016 1026   CREATININE 1.6 (H) 12/05/2016 1026      Component Value Date/Time   CALCIUM 10.2 12/05/2016 1026   ALKPHOS 69 12/05/2016 1026   AST 33 12/05/2016 1026   ALT 35 12/05/2016 1026   BILITOT 0.40 12/05/2016 1026       RADIOGRAPHIC STUDIES: I have personally reviewed the radiological images as listed and agreed with the findings in the report. Ir Removal Tun Access W/ Port W/o Fl  Result Date: 11/13/2016 INDICATION: Head and neck cancer, completed therapy, port catheter no longer required EXAM: REMOVAL RIGHT IJ VEIN PORT-A-CATH MEDICATIONS: 1% lidocaine with epinephrine locally ANESTHESIA/SEDATION: None. FLUOROSCOPY TIME:  Fluoroscopy Time: None. COMPLICATIONS: None immediate. PROCEDURE: Informed written consent was obtained from the patient after a thorough discussion of the procedural risks, benefits and alternatives. All questions were addressed. Maximal Sterile Barrier Technique was utilized including caps, mask, sterile gowns, sterile gloves, sterile drape, hand hygiene and skin antiseptic. A timeout was performed prior to the initiation of the procedure. The right chest was prepped and draped in a sterile fashion. Lidocaine was utilized for local anesthesia. An incision was made over the previously healed surgical incision. Utilizing blunt dissection, the port catheter and reservoir were removed from the underlying subcutaneous tissue in their entirety. Securing sutures were also removed. The pocket was irrigated with a copious amount of sterile normal saline. The pocket was closed with interrupted 3-0 Vicryl stitches. The subcutaneous tissue was closed with 3-0 Vicryl interrupted subcutaneous stitches. A 4-0 Vicryl running subcuticular  stitch was utilized to approximate the skin. Dermabond was applied. IMPRESSION: Successful right IJ vein Port-A-Cath explant. Electronically Signed   By: Jerilynn Mages.  Shick M.D.   On: 11/13/2016 14:10   Ir Gastrostomy Tube Removal  Result Date: 11/13/2016 INDICATION: Head and neck cancer, completed therapy EXAM: REMOVAL 20 FRENCH PULL-THROUGH GASTROSTOMY MEDICATIONS: Viscous lidocaine locally ANESTHESIA/SEDATION: None. CONTRAST:  None. FLUOROSCOPY TIME:  None. COMPLICATIONS: None immediate. PROCEDURE: Informed written consent was obtained from the patient after a thorough discussion of the procedural risks, benefits and alternatives. All questions were addressed. Maximal Sterile Barrier Technique was utilized including caps, mask, sterile gowns, sterile gloves, sterile drape, hand hygiene and skin antiseptic. A timeout was performed prior to the initiation of the procedure. The existing 20 French pull-through gastrostomy was removed utilizing manual firm retraction. Gastrostomy was removed entirely and intact. Sterile dressing applied  to the site. No immediate complication. IMPRESSION: Uncomplicated gastrostomy removal Electronically Signed   By: Jerilynn Mages.  Shick M.D.   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 The abnormal changes in the upper lung is not consistent with metastatic cancer. I plan to repeat imaging study again next month He needs a follow-up with ENT service in the near future. I will continue to see him on the regular basis for supportive care  Alcohol abuse He had recent alcohol intake. I suspect he could have caused flare of his neuropathy. I warn him that alcohol could increase his risk of recurrence of cancer.  Peripheral neuropathy due to chemotherapy [facility]) He had recent flare of peripheral neuropathy.  I suspect it is related to recent alcohol intake. 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 CT CHEST WO CONTRAST    Standing Status:   Future    Standing Expiration Date:   01/09/2018    Order Specific Question:   Reason for exam:    Answer:   lung nodules, hx tonsil ca    Order Specific Question:   Preferred imaging location?    Answer:   [facility]   All questions were answered. The patient knows to call the clinic with any problems, questions or concerns. No barriers to learning was detected. I spent 25 minutes counseling the patient face to face. The total time spent in the appointment was 30 minutes and more than 50% was on counseling and review of test results     Heath Lark, MD 12/05/2016 11:10 AM

## 2016-12-05 NOTE — Progress Notes (Signed)
Nutrition follow-up completed with patient status post treatment for tonsil cancer. Weight was documented as 140.2 pounds April 3, increased from 139.5 pounds March 6. Patient denies difficulty chewing and swallowing. His feeding tube was removed March 12. He is drinking Osmolite 1.5.  However, he is almost out of this product. Reports he has limited access to food and oral nutrition supplements.  Nutrition diagnosis: Inadequate oral intake has improved.  Intervention: Educated patient on the importance of consuming small frequent meals and snacks with high-calorie high-protein shakes. Provided patient with 2 cases of vanilla Ensure Plus. Also provided canned food from food pantry and referred patient to social worker for assistance as needed. Teach back method was used.  Monitoring, evaluation, goals: Patient will tolerate increased oral intake to meet greater than 90% of estimated needs for weight gain/weight stabilization.  Next visit: To be scheduled as needed.  **Disclaimer: This note was dictated with voice recognition software. Similar sounding words can inadvertently be transcribed and this note may contain transcription errors which may not have been corrected upon publication of note.**

## 2016-12-05 NOTE — Telephone Encounter (Signed)
LM with note below 

## 2016-12-05 NOTE — Assessment & Plan Note (Signed)
He is not symptomatic Lung exam is clear Per discussion from prior tumor board, I plan to repeat CT scan next month to follow on abnormal lung nodules

## 2016-12-05 NOTE — Assessment & Plan Note (Signed)
The patient has started smoking several cigarettes per day We discussed the importance of nicotine cessation due to risk of recurrence of cancer

## 2016-12-06 ENCOUNTER — Telehealth: Payer: Self-pay | Admitting: *Deleted

## 2016-12-06 ENCOUNTER — Telehealth: Payer: Self-pay | Admitting: Hematology and Oncology

## 2016-12-06 NOTE — Telephone Encounter (Signed)
Received a call from radiation oncology to schedule an appointment for the patient in late August. Okay to add appointment per desk nurse.

## 2016-12-06 NOTE — Telephone Encounter (Signed)
CALLED PATIENT TO INFORM OF LAB ON 04-26-17 @ 8 AM AND HIS FU WITH DR. Alvy Bimler ON 04-26-17 @ 8:30 AM, SPOKE WITH Regina Common AND THE PATIENT IS AWARE OF THESE APPTS.

## 2016-12-07 ENCOUNTER — Telehealth: Payer: Self-pay | Admitting: *Deleted

## 2016-12-07 NOTE — Telephone Encounter (Signed)
Oncology Nurse Navigator Documentation  Per Dr. Calton Dach guidance, called [facility] to arrange appointment.  Spoke with Janett Billow, requested patient be contacted and routine follow-up appt arranged with Dr. Erik Obey.   She verbalized understanding.  Gayleen Orem, RN, BSN, [place] [facility] Oncology Nurse [place] [facility] [place] [phone]

## 2017-01-09 MED FILL — PROAIR HFA 90 MCG INHALER: 108 (90 BAS | 30 days supply | Qty: 9 | Fill #2

## 2017-01-18 ENCOUNTER — Telehealth: Payer: Self-pay

## 2017-01-18 ENCOUNTER — Other Ambulatory Visit: Payer: Self-pay | Admitting: Hematology and Oncology

## 2017-01-18 DIAGNOSIS — C090 Malignant neoplasm of tonsillar fossa: Secondary | ICD-10-CM

## 2017-01-18 NOTE — Telephone Encounter (Signed)
Pls call Elmyra Ricks for urgent precert for CT neck. If it cannot be done on the same day as CT chest, please move labs,flush and appt to see me to a later date. Best to do all scans on the same day

## 2017-01-18 NOTE — Telephone Encounter (Signed)
Patient called and left message to call him back. Called patient back, patient states that he is having a CT of the chest  on 5-22. Patient is requesting if he can have some type of scan on his head at the same time.  He has started having head, face and throat pain and feeling funny. The pain started on the left side of his head near the area that he had treatment previously.

## 2017-01-23 ENCOUNTER — Encounter (HOSPITAL_COMMUNITY): Payer: Self-pay

## 2017-01-23 ENCOUNTER — Ambulatory Visit (HOSPITAL_COMMUNITY)
Admission: RE | Admit: 2017-01-23 | Discharge: 2017-01-23 | Disposition: A | Discharge status: Home / Self Care | Payer: Medicaid Other | Source: Ambulatory Visit | Attending: Hematology and Oncology | Admitting: Hematology and Oncology

## 2017-01-23 ENCOUNTER — Other Ambulatory Visit (HOSPITAL_BASED_OUTPATIENT_CLINIC_OR_DEPARTMENT_OTHER): Payer: Medicaid Other

## 2017-01-23 ENCOUNTER — Other Ambulatory Visit: Payer: Self-pay | Admitting: Hematology and Oncology

## 2017-01-23 DIAGNOSIS — Z923 Personal history of irradiation: Secondary | ICD-10-CM | POA: Insufficient documentation

## 2017-01-23 DIAGNOSIS — C090 Malignant neoplasm of tonsillar fossa: Secondary | ICD-10-CM

## 2017-01-23 DIAGNOSIS — J439 Emphysema, unspecified: Secondary | ICD-10-CM | POA: Diagnosis not present

## 2017-01-23 DIAGNOSIS — C770 Secondary and unspecified malignant neoplasm of lymph nodes of head, face and neck: Secondary | ICD-10-CM | POA: Insufficient documentation

## 2017-01-23 DIAGNOSIS — R918 Other nonspecific abnormal finding of lung field: Secondary | ICD-10-CM

## 2017-01-23 LAB — CBC WITH DIFFERENTIAL/PLATELET
BASO%: 0.3 % (ref 0.0–2.0)
Basophils Absolute: 0 10*3/uL (ref 0.0–0.1)
EOS%: 1.7 % (ref 0.0–7.0)
Eosinophils Absolute: 0.1 10*3/uL (ref 0.0–0.5)
HCT: 36.5 % — ABNORMAL LOW (ref 38.4–49.9)
HGB: 12.7 g/dL — ABNORMAL LOW (ref 13.0–17.1)
LYMPH%: 11.3 % — AB (ref 14.0–49.0)
MCH: 34.5 pg — ABNORMAL HIGH (ref 27.2–33.4)
MCHC: 34.8 g/dL (ref 32.0–36.0)
MCV: 99.4 fL — ABNORMAL HIGH (ref 79.3–98.0)
MONO#: 0.9 10*3/uL (ref 0.1–0.9)
MONO%: 14.5 % — AB (ref 0.0–14.0)
NEUT#: 4.2 10*3/uL (ref 1.5–6.5)
NEUT%: 72.2 % (ref 39.0–75.0)
Platelets: 197 10*3/uL (ref 140–400)
RBC: 3.67 10*6/uL — AB (ref 4.20–5.82)
RDW: 13.8 % (ref 11.0–14.6)
WBC: 5.9 10*3/uL (ref 4.0–10.3)
lymph#: 0.7 10*3/uL — ABNORMAL LOW (ref 0.9–3.3)

## 2017-01-23 LAB — COMPREHENSIVE METABOLIC PANEL
ALBUMIN: 4.1 g/dL (ref 3.5–5.0)
ALT: 58 U/L — ABNORMAL HIGH (ref 0–55)
AST: 58 U/L — AB (ref 5–34)
Alkaline Phosphatase: 62 U/L (ref 40–150)
Anion Gap: 11 mEq/L (ref 3–11)
BILIRUBIN TOTAL: 0.28 mg/dL (ref 0.20–1.20)
BUN: 28.3 mg/dL — AB (ref 7.0–26.0)
CO2: 27 mEq/L (ref 22–29)
Calcium: 9.9 mg/dL (ref 8.4–10.4)
Chloride: 104 mEq/L (ref 98–109)
Creatinine: 1.5 mg/dL — ABNORMAL HIGH (ref 0.7–1.3)
EGFR: 59 mL/min/{1.73_m2} — ABNORMAL LOW (ref >90)
GLUCOSE: 82 mg/dL (ref 70–140)
Potassium: 4.7 mEq/L (ref 3.5–5.1)
SODIUM: 142 meq/L (ref 136–145)
TOTAL PROTEIN: 7.4 g/dL (ref 6.4–8.3)

## 2017-01-23 MED ORDER — IOPAMIDOL (ISOVUE-300) INJECTION 61%: 100.0000 mL | Freq: Once | INTRAVENOUS | Status: DC | PRN

## 2017-01-23 MED ORDER — IOPAMIDOL (ISOVUE-300) INJECTION 61%
75.0000 mL | Freq: Once | INTRAVENOUS | Status: AC | PRN
  Administered 2017-01-23: 75 mL via INTRAVENOUS

## 2017-01-25 ENCOUNTER — Ambulatory Visit (HOSPITAL_BASED_OUTPATIENT_CLINIC_OR_DEPARTMENT_OTHER): Payer: Medicaid Other | Admitting: Hematology and Oncology

## 2017-01-25 ENCOUNTER — Encounter: Payer: Self-pay | Admitting: Hematology and Oncology

## 2017-01-25 ENCOUNTER — Telehealth: Payer: Self-pay | Admitting: Hematology and Oncology

## 2017-01-25 VITALS — BP 116/74 | HR 71 | Temp 98.6°F | Resp 17 | Ht 73.0 in | Wt 136.9 lb

## 2017-01-25 DIAGNOSIS — N183 Chronic kidney disease, stage 3 unspecified: Secondary | ICD-10-CM

## 2017-01-25 DIAGNOSIS — R918 Other nonspecific abnormal finding of lung field: Secondary | ICD-10-CM

## 2017-01-25 DIAGNOSIS — T451X5A Adverse effect of antineoplastic and immunosuppressive drugs, initial encounter: Secondary | ICD-10-CM

## 2017-01-25 DIAGNOSIS — F17200 Nicotine dependence, unspecified, uncomplicated: Secondary | ICD-10-CM | POA: Diagnosis not present

## 2017-01-25 DIAGNOSIS — G620 Drug-induced polyneuropathy: Secondary | ICD-10-CM

## 2017-01-25 DIAGNOSIS — C090 Malignant neoplasm of tonsillar fossa: Secondary | ICD-10-CM

## 2017-01-25 MED ORDER — GABAPENTIN 300 MG PO CAPS: 300.0000 mg | ORAL_CAPSULE | Freq: Three times a day (TID) | ORAL | 1 refills | Status: DC

## 2017-01-25 MED FILL — GABAPENTIN 300 MG CAPSULE: 300 | 30 days supply | Qty: 90 | Fill #0

## 2017-01-25 NOTE — Assessment & Plan Note (Signed)
Likely due to dehydration and recent cisplatin I continue to encourage him increase fluids as tolerated

## 2017-01-25 NOTE — Assessment & Plan Note (Signed)
CT scan show no evidence of cancer progression Per radiologist recommendation, will repeat imaging study in 6 months

## 2017-01-25 NOTE — Assessment & Plan Note (Signed)
The patient has started smoking several cigarettes per day We discussed the importance of nicotine cessation due to risk of recurrence of cancer

## 2017-01-25 NOTE — Telephone Encounter (Signed)
Gave patient AVS and calender per 5/24 LOS.  

## 2017-01-25 NOTE — Assessment & Plan Note (Signed)
He ran out of prescription gabapentin recently I refill his prescription today

## 2017-01-25 NOTE — Progress Notes (Signed)
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 Chronic small-vessel ischemic changes of the cerebral hemispheric white matter.       04/04/2016 Imaging    CT neck showed left tonsillar mass with diameter of 19 x 24 mm consistent with 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 necrosis. Suspicious level 5/supraclavicular nodes on the left. Scar type density in the right upper lobe is more prominent than was seen on a CT scan of the chest 10/06/2014      04/21/2016 PET scan    Asymmetric hypermetabolic soft tissue prominence in left palatine tonsil, consistent with known primary left tonsillar squamous cell Carcinoma. Left level 2 cervical lymphadenopathy, consistent with metastatic disease. No evidence of metastatic disease within the chest, abdomen, or pelvis. Stable 14 mm ground-glass and part solid nodular opacity in right lung apex compared to previous CT in 2016. This shows low-grade metabolic activity. Low-grade bronchogenic adenocarcinoma cannot be excluded. Consider surgical resection versus continued followup by CT in 12 months      05/17/2016 Procedure    Successful placement of a 20 French  pull through gastrostomy tube & right IJ approach [facility] [place].      05/22/2016 - 07/03/2016 Chemotherapy    He received 3 doses of high dose cisplatin with radiation       05/23/2016 - 07/10/2016 Radiation Therapy    Rec'd Helical IMRT:  Left tonsil and bilateral neck / 70 Gy in 35 fractions to gross disease, 63 Gy in 35 fractions to high risk nodal echelons, and 56 Gy in 35 fractions to intermediate risk nodal echelons.          10/04/2016 - 10/07/2016 Hospital Admission    He was admitted due to atypical chest pain from illicit drug use. CT scan showed possible atypical pneumonia versus radiation pneumonitis      10/04/2016 Imaging    Ct scan showed no evidence of pulmonary embolus. 2. Patchy bilateral airspace opacities, at the upper lobes, compatible with multifocal pneumonia. This corresponds to the finding on recent chest radiograph. 3. Previously noted ground-glass and solid nodule at the right lung apex demonstrates increased solid components. The solid component now measures 1.2 cm. 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 Tissue diagnosis would be helpful, as deemed clinically appropriate.      10/26/2016 PET scan    Slight increasing solid component of a right apical lung lesion but no concerning hypermetabolism. Recommend continued surveillance. 2. Bilateral patchy apical airspace opacities are mild to moderately hypermetabolic and likely reflect infection. Recommend short-term followup noncontrast chest CT in 3 months to reassess. 3. No mediastinal or hilar mass or adenopathy. 4. No significant findings in the abdomen/pelvis. 5. Stable lucencies throughout  the bony pelvis but no hypermetabolism to suggest metastasis.      11/13/2016 Procedure    Successful right IJ vein Port-A-Cath explant.      11/13/2016 Procedure    Uncomplicated gastrostomy removal      01/23/2017 Imaging    Ct neck: 1. Sequelae of radiation therapy in  the neck. The oropharynx level of this neck CT is degraded by motion artifact. Still, soft tissue volume has definitely decreased at the level of the 2017 left tonsillar mass, suggesting positive response to treatment and concordant with the negative findings on the February PET-CT. 2. Normalized left level 2 metastatic lymph node. No abnormal cervical lymph nodes today. 3. Chest CT findings today are reported separately.      01/23/2017 Imaging    Ct chest: 1. Stable biapical pleural and parenchymal scarring changes with patchy airspace opacities and interstitial thickening most likely representing postinflammatory/postinfectious scarring changes. No new pulmonary lesions to suggest metastatic disease. Recommend six-month follow-up noncontrast chest CT to reassess. 2. Stable emphysematous changes. 3. No mediastinal or hilar mass or adenopathy.       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 He has not seen ENT service recently He denies swallowing difficulties No recent cough, congestion or infection  REVIEW OF SYSTEMS:   Constitutional: Denies fevers, chills  Eyes: Denies blurriness of vision Ears, nose, mouth, throat, and face: Denies mucositis or sore throat Respiratory: Denies cough, dyspnea or wheezes Cardiovascular: Denies palpitation, chest discomfort or lower extremity swelling Gastrointestinal:  Denies nausea, heartburn or change in bowel habits Skin: Denies abnormal skin rashes Lymphatics: Denies new lymphadenopathy or easy bruising Neurological:Denies numbness, tingling or new weaknesses Behavioral/Psych: Mood is stable, no new changes  All other systems were reviewed with the patient and are negative.  I have reviewed the past medical  history, past surgical history, social history and family history with the patient and they are unchanged from previous note.  ALLERGIES:  has No Known Allergies.  MEDICATIONS:  Current Outpatient Prescriptions  Medication Sig Dispense Refill  . aspirin EC 81 MG tablet Take 81 mg by mouth daily.    Marland Kitchen gabapentin (NEURONTIN) 300 MG capsule Take 1 capsule (300 mg total) by mouth 3 (three) times daily. 90 capsule 1  . predniSONE (DELTASONE) 10 MG tablet Take 1 tablet (10 mg total) by mouth daily with breakfast. 30 tablet 0  . PROAIR HFA 108 (90 Base) MCG/ACT inhaler   3   Current Facility-Administered Medications  Medication Dose Route Frequency Provider Last Rate Last Dose  . dexamethasone (DECADRON) injection 10 mg  10 mg Intravenous Once Alvy Bimler, Nassir Neidert, MD      . LORazepam (ATIVAN) injection 0.5 mg  0.5 mg Intravenous Once Heath Lark, MD       Facility-Administered Medications Ordered in Other Visits  Medication Dose Route Frequency Provider Last Rate Last Dose  . sodium chloride flush (NS) 0.9 % injection 10 mL  10 mL Intracatheter PRN Alvy Bimler, Emonie Espericueta, MD   10 mL at 05/24/16 1615    PHYSICAL EXAMINATION: ECOG PERFORMANCE STATUS: 0 - Asymptomatic  Vitals:   01/25/17 0836  BP: 116/74  Pulse: 71  Resp: 17  Temp: 98.6 F (37 C)   Filed Weights   01/25/17 0836  Weight: 136 lb 14.4  oz (62.1 kg)    GENERAL:alert, no distress and comfortable SKIN: skin color, texture, turgor are normal, no rashes or significant lesions EYES: normal, Conjunctiva are pink and non-injected, sclera clear OROPHARYNX:no exudate, no erythema and lips, buccal mucosa, and tongue normal  NECK: supple, thyroid normal size, non-tender, without nodularity LYMPH:  no palpable lymphadenopathy in the cervical, axillary or inguinal LUNGS: clear to auscultation and percussion with normal breathing effort HEART: regular rate & rhythm and no murmurs and no lower extremity edema ABDOMEN:abdomen soft, non-tender and normal  bowel sounds Musculoskeletal:no cyanosis of digits and no clubbing  NEURO: alert & oriented x 3 with fluent speech, no focal motor/sensory deficits  LABORATORY DATA:  I have reviewed the data as listed    Component Value Date/Time   NA 142 01/23/2017 0737   K 4.7 01/23/2017 0737   CL 99 (L) 10/05/2016 0110   CO2 27 01/23/2017 0737   GLUCOSE 82 01/23/2017 0737   BUN 28.3 (H) 01/23/2017 0737   CREATININE 1.5 (H) 01/23/2017 0737   CALCIUM 9.9 01/23/2017 0737   PROT 7.4 01/23/2017 0737   ALBUMIN 4.1 01/23/2017 0737   AST 58 (H) 01/23/2017 0737   ALT 58 (H) 01/23/2017 0737   ALKPHOS 62 01/23/2017 0737   BILITOT 0.28 01/23/2017 0737   GFRNONAA 56 (L) 10/05/2016 0110   GFRAA >60 10/05/2016 0110    No results found for: SPEP, UPEP  Lab Results  Component Value Date   WBC 5.9 01/23/2017   NEUTROABS 4.2 01/23/2017   HGB 12.7 (L) 01/23/2017   HCT 36.5 (L) 01/23/2017   MCV 99.4 (H) 01/23/2017   PLT 197 01/23/2017      Chemistry      Component Value Date/Time   NA 142 01/23/2017 0737   K 4.7 01/23/2017 0737   CL 99 (L) 10/05/2016 0110   CO2 27 01/23/2017 0737   BUN 28.3 (H) 01/23/2017 0737   CREATININE 1.5 (H) 01/23/2017 0737      Component Value Date/Time   CALCIUM 9.9 01/23/2017 0737   ALKPHOS 62 01/23/2017 0737   AST 58 (H) 01/23/2017 0737   ALT 58 (H) 01/23/2017 0737   BILITOT 0.28 01/23/2017 0737       RADIOGRAPHIC STUDIES: I have personally reviewed the radiological images as listed and agreed with the findings in the report. Ct Soft Tissue Neck W Contrast  Result Date: 01/23/2017 CLINICAL DATA:  61 year old male restaging left tonsillar carcinoma diagnosed in 2017 status post radiation, chemotherapy ongoing. EXAM: CT NECK WITH CONTRAST TECHNIQUE: Multidetector CT imaging of the neck was performed using the standard protocol following the bolus administration of intravenous contrast. CONTRAST:  6mL ISOVUE-300 IOPAMIDOL (ISOVUE-300) INJECTION 61% in conjunction  with contrast enhanced imaging of the chest reported separately. COMPARISON:  PET-CT 10/26/2016.  Neck CT 04/04/2016 FINDINGS: Pharynx and larynx: Laryngeal soft tissue contours are within normal limits. There is mild motion artifact at the oropharynx today, and this does obscure the level of the primary tumor as seen on 04/04/2016. However, there is clearly less left palatine tonsil soft tissue compared to that exam. There is mild generalized pharyngeal mucosal space soft tissue thickening compatible with sequelae of radiation. Mild associated parapharyngeal and retropharyngeal space edema/stranding. Salivary glands: Some of the sublingual space is obscured by motion today. Submandibular gland hyper enhancement is compatible with interval radiation. The parotid glands are largely obscured today. Thyroid: Negative. Lymph nodes: The dominant left level 2 metastatic node has significantly regressed and now measures 6 mm short  axis (versus 19 mm in 2017). No new or increased cervical lymph nodes. No cystic or necrotic nodes. Vascular: Major vascular structures in the neck and at the skullbase appear patent. Limited intracranial: Negative. Visualized orbits: Negative. Mastoids and visualized paranasal sinuses: Clear. Skeleton: Cervical spine degeneration. No acute or suspicious osseous lesion identified. Upper chest: Reported separately today. IMPRESSION: 1. Sequelae of radiation therapy in the neck. The oropharynx level of this neck CT is degraded by motion artifact. Still, soft tissue volume has definitely decreased at the level of the 2017 left tonsillar mass, suggesting positive response to treatment and concordant with the negative findings on the February PET-CT. 2. Normalized left level 2 metastatic lymph node. No abnormal cervical lymph nodes today. 3. Chest CT findings today are reported separately. Electronically Signed   By: Genevie Ann M.D.   On: 01/23/2017 14:20   Ct Chest W Contrast  Result Date:  01/23/2017 CLINICAL DATA:  Restaging left tonsil carcinoma. Diagnosis 2017. Radiation complete. Chemotherapy ongoing. EXAM: CT CHEST WITH CONTRAST TECHNIQUE: Multidetector CT imaging of the chest was performed during intravenous contrast administration. CONTRAST:  28mL ISOVUE-300 IOPAMIDOL (ISOVUE-300) INJECTION 61% COMPARISON:  PET-CT 10/26/2016 and chest CT 10/04/2016. FINDINGS: Cardiovascular: The heart is normal in size. No pericardial effusion. Mild tortuosity of the thoracic aorta but no dissection or atherosclerotic calcifications. The branch vessels are patent. No definite coronary artery calcifications. Mediastinum/Nodes: No mediastinal or hilar mass or lymphadenopathy. Small scattered lymph nodes are stable. The esophagus is grossly normal. Lungs/Pleura: Stable biapical pleural and parenchymal scarring changes, right greater than left with a persistent right apical lung density. This measures approximately 14 x 12 mm and is unchanged. The more medial smaller density is also stable. Stable bilateral ill-defined upper lobe patchy airspace opacities with interstitial thickening. This is likely scarring change related to prior infection or inflammation. No progressive findings. No new pulmonary nodules. No acute overlying pulmonary process. No pleural effusion. No interstitial lung disease or bronchiectasis. Stable emphysematous changes with hyperinflation. Upper Abdomen: No significant upper abdominal findings. Chest wall/ Musculoskeletal: No chest wall mass, supraclavicular or axillary lymphadenopathy. The thyroid gland is grossly normal. The bony thorax is intact. No findings suspicious for osseous metastatic disease. Patchy areas of demineralization appears stable. IMPRESSION: 1. Stable biapical pleural and parenchymal scarring changes with patchy airspace opacities and interstitial thickening most likely representing postinflammatory/postinfectious scarring changes. No new pulmonary lesions to suggest  metastatic disease. Recommend six-month follow-up noncontrast chest CT to reassess. 2. Stable emphysematous changes. 3. No mediastinal or hilar mass or adenopathy. Electronically Signed   By: Marijo Sanes M.D.   On: 01/23/2017 15:26    ASSESSMENT & PLAN:  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[facility]) CT imaging studies show no evidence of cancer I recommend close ENT follow-up   Multiple lung nodules on CT CT scan show no evidence of cancer progression Per radiologist recommendation, will repeat imaging study in 6 months  Smoker The patient has started smoking several cigarettes per day We discussed the importance of nicotine cessation due to risk of recurrence of cancer  Chronic kidney disease, stage III (moderate) Likely due to dehydration and recent cisplatin I continue to encourage him increase fluids as tolerated  Peripheral neuropathy due to chemotherapy [facility]) He ran out of prescription gabapentin recently I refill his prescription today   Orders Placed This Encounter  Procedures  . CT CHEST W CONTRAST    Standing Status:   Future    Standing Expiration Date:   03/01/2018  Order Specific Question:   Reason for exam:    Answer:   lung nodules follow-up    Order Specific Question:   Preferred imaging location?    Answer:   [facility]  . CBC with Differential/Platelet    Standing Status:   Future    Standing Expiration Date:   03/01/2018  . Comprehensive metabolic panel    Standing Status:   Future    Standing Expiration Date:   03/01/2018   All questions were answered. The patient knows to call the clinic with any problems, questions or concerns. No barriers to learning was detected. I spent 25 minutes counseling the patient face to face. The total time spent in the appointment was 30 minutes and more than 50% was on counseling and review of test results     Heath Lark, MD 01/25/2017 12:19 PM

## 2017-01-25 NOTE — Assessment & Plan Note (Addendum)
CT imaging studies show no evidence of cancer I recommend close ENT follow-up

## 2017-01-26 ENCOUNTER — Telehealth: Payer: Self-pay | Admitting: *Deleted

## 2017-01-26 NOTE — Telephone Encounter (Signed)
Oncology Nurse Navigator Documentation  Called ENT Dr. Noreene Filbert office to coordinate routing post-tmt follow-up, spoke with [facility].  She noted patient was No Show for his 5/15 10:20 appt, will call him to reschedule.  Gayleen Orem, RN, BSN, [place] Oncology Nurse [place] [facility] [place] [phone]

## 2017-03-12 ENCOUNTER — Encounter (HOSPITAL_COMMUNITY): Payer: Self-pay | Admitting: Emergency Medicine

## 2017-03-12 ENCOUNTER — Emergency Department (HOSPITAL_COMMUNITY)
Admission: EM | Admit: 2017-03-12 | Discharge: 2017-03-12 | Disposition: A | Discharge status: Home / Self Care | Payer: Medicaid Other | Attending: Emergency Medicine | Admitting: Emergency Medicine

## 2017-03-12 DIAGNOSIS — F17210 Nicotine dependence, cigarettes, uncomplicated: Secondary | ICD-10-CM | POA: Insufficient documentation

## 2017-03-12 DIAGNOSIS — C099 Malignant neoplasm of tonsil, unspecified: Secondary | ICD-10-CM | POA: Diagnosis not present

## 2017-03-12 DIAGNOSIS — J0101 Acute recurrent maxillary sinusitis: Secondary | ICD-10-CM | POA: Insufficient documentation

## 2017-03-12 DIAGNOSIS — J449 Chronic obstructive pulmonary disease, unspecified: Secondary | ICD-10-CM | POA: Insufficient documentation

## 2017-03-12 DIAGNOSIS — R51 Headache: Secondary | ICD-10-CM | POA: Diagnosis present

## 2017-03-12 DIAGNOSIS — N183 Chronic kidney disease, stage 3 (moderate): Secondary | ICD-10-CM | POA: Insufficient documentation

## 2017-03-12 MED ORDER — OXYMETAZOLINE HCL 0.05 % NA SOLN
2.0000 | Freq: Once | NASAL | Status: AC
  Administered 2017-03-12: 2 via NASAL
  Filled 2017-03-12: qty 15

## 2017-03-12 MED ORDER — AMOXICILLIN-POT CLAVULANATE 875-125 MG PO TABS
1.0000 | ORAL_TABLET | Freq: Once | ORAL | Status: AC
  Administered 2017-03-12: 1 via ORAL
  Filled 2017-03-12: qty 1

## 2017-03-12 MED ORDER — AMOXICILLIN-POT CLAVULANATE 875-125 MG PO TABS: 1.0000 | ORAL_TABLET | Freq: Two times a day (BID) | ORAL | 0 refills | Status: DC

## 2017-03-12 MED ORDER — ACETAMINOPHEN 500 MG PO TABS
1000.0000 mg | ORAL_TABLET | Freq: Once | ORAL | Status: AC
  Administered 2017-03-12: 1000 mg via ORAL
  Filled 2017-03-12: qty 2

## 2017-03-12 NOTE — ED Notes (Signed)
Pt ambulated to room with a steady gait and had no complaints. Pt assisted to restroom.

## 2017-03-12 NOTE — ED Provider Notes (Signed)
[facility] DEPT Provider Note   CSN: [id number] Arrival date & time: 03/12/17  1017     History   Chief Complaint Chief Complaint  Patient presents with  . Headache    "head tightness"  . Facial Pain    HPI [name] [name] is a 61 y.o. male.  The history is provided by the patient.  Illness  This is a recurrent problem. The current episode started more than 2 days ago. The problem occurs constantly. The problem has been gradually worsening. Associated symptoms include headaches. Pertinent negatives include no chest pain, no abdominal pain and no shortness of breath. Associated symptoms comments: Sinus pressure. Nothing aggravates the symptoms. Nothing relieves the symptoms. He has tried nothing for the symptoms.    Past Medical History:  Diagnosis Date  . Anxiety   . Cancer ([place]) 04/04/2016   cancer of left tonsil  . CKD (chronic kidney disease), stage III   . COPD (chronic obstructive pulmonary disease) ([place])   . Hepatitis   . Hepatitis C    Treatment x 8 weeks 6-7 months ago Harvoni  . History of radiation therapy 05/23/16- 07/10/16   Left Tonsil and bilateral neck  . Sinus congestion   . Stomach ulcer     Patient Active Problem List   Diagnosis Date Noted  . Multiple lung nodules on CT 12/05/2016  . 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 Multifocal pneumonia 10/04/2016  . Chronic kidney disease, stage III (moderate) 09/14/2016  . Dry mouth 09/14/2016  . Dysgeusia 09/05/2016  . Dehydration 09/01/2016  . Bronchitis 09/01/2016  . Diarrhea 09/01/2016  . Anemia due to chronic illness 08/24/2016  . Acute prerenal failure ([place]) 07/24/2016  . Hemoptysis 07/24/2016  . Pancytopenia, acquired ([place]) 07/04/2016  . Radiation dermatitis 07/04/2016  . Protein-calorie malnutrition, moderate ([place]) 06/27/2016  . Atypical chest pain 06/27/2016  . Bilateral tinnitus 05/30/2016  . Chemotherapy-induced nausea 05/30/2016  .  Constipation, acute 05/30/2016  . Unintentional weight loss 05/30/2016  . Hypersensitivity reaction 05/23/2016  . S/P gastrostomy ([place]) 05/18/2016  . Cancer associated pain 05/18/2016  . Lesion of right lung 04/26/2016  . 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 History of hepatitis C 04/24/2016  . Chest pain 09/17/2014  . Anxiety   . Dyspnea 03/25/2014  . COPD GOLD II 07/25/2013  . Sinusitis, chronic 07/25/2013  . Smoker 07/25/2013    Past Surgical History:  Procedure Laterality Date  . COLONOSCOPY WITH PROPOFOL N/A 04/14/2015   Procedure: COLONOSCOPY WITH PROPOFOL;  Surgeon: Arta Silence, MD;  [facility];  Service: Endoscopy;  Laterality: N/A;  . HERNIA REPAIR    . IR GENERIC HISTORICAL  05/17/2016   IR FLUORO GUIDE PORT INSERTION RIGHT 05/17/2016 Jacqulynn Cadet, MD [facility]  . IR GENERIC HISTORICAL  05/17/2016   IR US GUIDE VASC ACCESS RIGHT 05/17/2016 Jacqulynn Cadet, MD [facility]  . IR GENERIC HISTORICAL  05/17/2016   IR GASTROSTOMY TUBE MOD SED 05/17/2016 Jacqulynn Cadet, MD [facility]  . IR GENERIC HISTORICAL  11/13/2016   IR GASTROSTOMY TUBE REMOVAL 11/13/2016 Greggory Keen, MD [facility]  . IR GENERIC HISTORICAL  11/13/2016   IR REMOVAL TUN ACCESS W/ PORT W/O FL MOD SED 11/13/2016 Greggory Keen, MD [facility]  . MULTIPLE EXTRACTIONS WITH ALVEOLOPLASTY N/A 05/03/2016   Procedure: Multiple extractions with alveoloplasty and gross debridement of remaining teeth.;  Surgeon: Lenn Cal, DDS;  Location: [facility];  Service: Oral Surgery;  Laterality: N/A;  . STOMACH SURGERY     20 years       Home Medications    Prior to Admission medications   Medication Sig Start Date End Date Taking? Authorizing Provider  amoxicillin-clavulanate (AUGMENTIN) 875-125 MG tablet Take 1 tablet by mouth every 12 (twelve) hours. 03/12/17   Valda Lamb, MD  gabapentin (NEURONTIN) 300 MG capsule Take 1 capsule (300 mg total) by mouth 3 (three)  times daily. Patient not taking: Reported on 03/12/2017 01/25/17   Heath Lark, MD    Family History Family History  Problem Relation Age of Onset  . Hypertension Mother   . Anxiety disorder Mother   . Diabetes Mother   . Cancer Father     Social History Social History  Substance Use Topics  . Smoking status: Current Some Day Smoker    Packs/day: 0.50    Years: 30.00    Types: Cigarettes  . Smokeless tobacco: Never Used  . Alcohol use Yes     Comment: quit about 3 months ago, drank beer (he tells me a lot of beer)     Allergies   Patient has no known allergies.   Review of Systems Review of Systems  Constitutional: Negative for chills and fever.  HENT: Positive for congestion, sinus pain and sinus pressure. Negative for ear pain and sore throat.   Eyes: Negative for pain and visual disturbance.  Respiratory: Negative for cough and shortness of breath.   Cardiovascular: Negative for chest pain and palpitations.  Gastrointestinal: Negative for abdominal pain and vomiting.  Endocrine: Negative for polyuria.  Genitourinary: Negative for dysuria and hematuria.  Musculoskeletal: Negative for arthralgias and back pain.  Skin: Negative for color change and rash.  Neurological: Positive for headaches. Negative for seizures and syncope.  Psychiatric/Behavioral: Negative for agitation and behavioral problems.  All other systems reviewed and are negative.    Physical Exam Updated Vital Signs BP 117/88 (BP Location: Right Arm)   Pulse 63   Temp 98.4 F (36.9 C) (Oral)   Resp 15   Ht 6' 1.5" (1.867 m)   Wt 63.5 kg (140 lb)   SpO2 100%   BMI 18.22 kg/m   Physical Exam  Constitutional: He is oriented to person, place, and time. He appears well-developed and well-nourished.  HENT:  Head: Normocephalic and atraumatic.  Mouth/Throat: Oropharynx is clear and moist.  Eyes: Conjunctivae are normal.  Neck: Normal range of motion. Neck supple. No tracheal deviation present.    Cardiovascular: Normal rate and regular rhythm.   No murmur heard. Pulmonary/Chest: Effort normal and breath sounds normal. No respiratory distress.  Abdominal: Soft. There is no tenderness.  Musculoskeletal: He exhibits no edema.  Neurological: He is alert and oriented to person, place, and time. No cranial nerve deficit or sensory deficit. He exhibits normal muscle tone. Coordination normal.  Normal finger-nose, able to ambulate, photophobia muscle strength throughout and sensation intact throughout.  Skin: Skin is warm and dry.  Psychiatric: He has a normal mood and affect.  Nursing note and vitals reviewed.    ED Treatments / Results  Labs (all labs ordered are listed, but only abnormal results are displayed) Labs Reviewed - No data to display  EKG  EKG Interpretation None       Radiology No results found.  Procedures Procedures (including critical care time)  Medications Ordered in ED Medications  acetaminophen (TYLENOL) tablet 1,000 mg (1,000 mg Oral Given 03/12/17 1902)  oxymetazoline (AFRIN) 0.05 % nasal spray 2 spray (  2 sprays Each Nare Given 03/12/17 1903)  amoxicillin-clavulanate (AUGMENTIN) 875-125 MG per tablet 1 tablet (1 tablet Oral Given 03/12/17 2023)     Initial Impression / Assessment and Plan / ED Course  I have reviewed the triage vital signs and the nursing notes.  Pertinent labs & imaging results that were available during my care of the patient were reviewed by me and considered in my medical decision making (see chart for details).     [name] [name] is a 61 year old male with history of Chronic sinusitis, throat cancer treated with radiation, chemotherapy and his last treatment was several months ago coming in today with headache. Patient states he has had a worsening headache over the last 4 days. States it feels like his normal sinus pain but now a little worse. Describes it as a tightness as well as a frontal pressure. This had no nausea,  vomiting. Not the worse headache of his life and does not awaken him from sleep tonight. Denies any altered mental status or confusion.  On exam he is sitting up in bed in no apparent distress with stable vital signs are within normal limits. Neurological exam is afocal.Do not believe CT imaging is necessary, as patient has unremarkable neurologic examination with no concerning signs or symptoms of brain mass or history of trauma and history is also inconsistent with hemorrhage. Likely exacerbation of his chronic sinusitis. Given Tylenol and Afrin spray here in the emergency department as well as his first pill of Augmentin, and will be discharged with a course of Augmentin. Stable under my care in stable at time of discharge  Patient was seen with my attending, Dr. Laverta Baltimore, who voiced agreement and oversaw the evaluation and treatment of this patient.   [employer] seismologist was used in the creation of this note. If there are any errors or inconsistencies needing clarification, please contact me directly.   Final Clinical Impressions(s) / ED Diagnoses   Final diagnoses:  Acute recurrent maxillary sinusitis    New Prescriptions New Prescriptions   AMOXICILLIN-CLAVULANATE (AUGMENTIN) 875-125 MG TABLET    Take 1 tablet by mouth every 12 (twelve) hours.     Valda Lamb, MD 03/12/17 2100    Margette Fast, MD 03/13/17 [facility] [place]

## 2017-03-12 NOTE — ED Triage Notes (Signed)
Pt. Stated, I feel like I have a lot of pressure in my head and I've had sinus infection before just don't know whats going on.  I have throat cancer and Im finished with treatment.  Unable to get my appetite back.

## 2017-03-13 MED FILL — AMOX TR-K CLV 875-125 MG TA: 875-125 | 7 days supply | Qty: 14 | Fill #0

## 2017-03-13 MED FILL — SYMBICORT 160-4.5 MCG INH: 160-4.5 | 30 days supply | Qty: 10 | Fill #0

## 2017-03-21 MED FILL — FLUTICASONE PROP 50 MCG SPR: 50 | 30 days supply | Qty: 16 | Fill #0

## 2017-03-21 MED FILL — MONTELUKAST SOD 10 MG TAB: 10 | 30 days supply | Qty: 30 | Fill #0

## 2017-03-21 MED FILL — AZITHROMYCIN 250 MG TAB: 250 | 5 days supply | Qty: 6 | Fill #0

## 2017-03-27 ENCOUNTER — Encounter (HOSPITAL_COMMUNITY): Payer: Self-pay

## 2017-03-27 ENCOUNTER — Emergency Department (HOSPITAL_COMMUNITY)
Admission: EM | Admit: 2017-03-27 | Discharge: 2017-03-27 | Disposition: A | Discharge status: Home / Self Care | Payer: Medicaid Other | Attending: Emergency Medicine | Admitting: Emergency Medicine

## 2017-03-27 DIAGNOSIS — R519 Headache, unspecified: Secondary | ICD-10-CM

## 2017-03-27 DIAGNOSIS — Z79899 Other long term (current) drug therapy: Secondary | ICD-10-CM | POA: Diagnosis not present

## 2017-03-27 DIAGNOSIS — N183 Chronic kidney disease, stage 3 (moderate): Secondary | ICD-10-CM | POA: Insufficient documentation

## 2017-03-27 DIAGNOSIS — T485X5A Adverse effect of other anti-common-cold drugs, initial encounter: Secondary | ICD-10-CM

## 2017-03-27 DIAGNOSIS — J310 Chronic rhinitis: Secondary | ICD-10-CM | POA: Insufficient documentation

## 2017-03-27 DIAGNOSIS — R51 Headache: Secondary | ICD-10-CM | POA: Diagnosis not present

## 2017-03-27 DIAGNOSIS — Z87891 Personal history of nicotine dependence: Secondary | ICD-10-CM | POA: Insufficient documentation

## 2017-03-27 DIAGNOSIS — J449 Chronic obstructive pulmonary disease, unspecified: Secondary | ICD-10-CM | POA: Insufficient documentation

## 2017-03-27 MED ORDER — KETOROLAC TROMETHAMINE 60 MG/2ML IM SOLN
30.0000 mg | Freq: Once | INTRAMUSCULAR | Status: AC
  Administered 2017-03-27: 30 mg via INTRAMUSCULAR
  Filled 2017-03-27: qty 2

## 2017-03-27 NOTE — ED Triage Notes (Signed)
Per Pt, Pt is coming from home with complaints of face pain and headache that has been going on for three weeks. Pt reports he was given antibiotics by ED and his PCP with no relief. Pt has hx of throat cancer,

## 2017-03-27 NOTE — ED Provider Notes (Signed)
[facility] DEPT Provider Note   CSN: [id number] Arrival date & time: 03/27/17  1355     History   Chief Complaint Chief Complaint  Patient presents with  . Headache    HPI DHANUSH JOKERST is a 61 y.o. male.  HPI the patient is a 61 y.o. male who presents to the ED with several weeks of intermittent frontal and maxillary headache. Onset was gradual in nature. Patient has been treated for sinusitis with Augmentin, and azithromycin. Patient also put on allergy medicine, including intranasal steroids. He reports that this has had minimal improvement in his symptomatology. In fact, patient reports that his headache worsens after using the intranasal spray. Pain is exacerbated with bending forward and palpation of the sinuses. No other alleviating or aggravating factors. Denies any fevers, vision changes, focal deficits, trauma.   Past Medical History:  Diagnosis Date  . Anxiety   . Cancer ([place]) 04/04/2016   cancer of left tonsil  . CKD (chronic kidney disease), stage III   . COPD (chronic obstructive pulmonary disease) ([place])   . Hepatitis   . Hepatitis C    Treatment x 8 weeks 6-7 months ago Harvoni  . History of radiation therapy 05/23/16- 07/10/16   Left Tonsil and bilateral neck  . Sinus congestion   . Stomach ulcer     Patient Active Problem List   Diagnosis Date Noted  . Multiple lung nodules on CT 12/05/2016  . 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 Multifocal pneumonia 10/04/2016  . Chronic kidney disease, stage III (moderate) 09/14/2016  . Dry mouth 09/14/2016  . Dysgeusia 09/05/2016  . Dehydration 09/01/2016  . Bronchitis 09/01/2016  . Diarrhea 09/01/2016  . Anemia due to chronic illness 08/24/2016  . Acute prerenal failure ([place]) 07/24/2016  . Hemoptysis 07/24/2016  . Pancytopenia, acquired ([place]) 07/04/2016  . Radiation dermatitis 07/04/2016  . Protein-calorie malnutrition, moderate ([place]) 06/27/2016  .  Atypical chest pain 06/27/2016  . Bilateral tinnitus 05/30/2016  . Chemotherapy-induced nausea 05/30/2016  . Constipation, acute 05/30/2016  . Unintentional weight loss 05/30/2016  . Hypersensitivity reaction 05/23/2016  . S/P gastrostomy ([place]) 05/18/2016  . Cancer associated pain 05/18/2016  . Lesion of right lung 04/26/2016  . 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[place]) 04/24/2016  . History of hepatitis C 04/24/2016  . Chest pain 09/17/2014  . Anxiety   . Dyspnea 03/25/2014  . COPD GOLD II 07/25/2013  . Sinusitis, chronic 07/25/2013  . Smoker 07/25/2013    Past Surgical History:  Procedure Laterality Date  . COLONOSCOPY WITH PROPOFOL N/A 04/14/2015   Procedure: COLONOSCOPY WITH PROPOFOL;  Surgeon: Arta Silence, MD;  [facility];  Service: Endoscopy;  Laterality: N/A;  . HERNIA REPAIR    . IR GENERIC HISTORICAL  05/17/2016   IR FLUORO GUIDE PORT INSERTION RIGHT 05/17/2016 Jacqulynn Cadet, MD [facility]  . IR GENERIC HISTORICAL  05/17/2016   IR US GUIDE VASC ACCESS RIGHT 05/17/2016 Jacqulynn Cadet, MD [facility]  . IR GENERIC HISTORICAL  05/17/2016   IR GASTROSTOMY TUBE MOD SED 05/17/2016 Jacqulynn Cadet, MD [facility]  . IR GENERIC HISTORICAL  11/13/2016   IR GASTROSTOMY TUBE REMOVAL 11/13/2016 Greggory Keen, MD [facility]  . IR GENERIC HISTORICAL  11/13/2016   IR REMOVAL TUN ACCESS W/ PORT W/O FL MOD SED 11/13/2016 Greggory Keen, MD [facility]  . MULTIPLE EXTRACTIONS WITH ALVEOLOPLASTY N/A 05/03/2016   Procedure: Multiple extractions with alveoloplasty and gross debridement of remaining teeth.;  Surgeon: Lenn Cal, DDS;  Location[facility];  Service: Oral Surgery;  Laterality: N/A;  . STOMACH SURGERY     20 years       Home Medications    Prior to Admission medications   Medication Sig Start Date End Date Taking? Authorizing Provider  azithromycin (ZITHROMAX) 250 MG tablet Take 250-500 mg by mouth daily. 500 mg for day 1, 250 mg for 4 days,  started on 03-21-17   Yes [provider]  fluticasone (FLONASE) 50 MCG/ACT nasal spray Place 1 spray into both nostrils daily.   Yes [provider]  montelukast (SINGULAIR) 10 MG tablet Take 10 mg by mouth at bedtime.   Yes [provider]  amoxicillin-clavulanate (AUGMENTIN) 875-125 MG tablet Take 1 tablet by mouth every 12 (twelve) hours. Patient not taking: Reported on 03/27/2017 03/12/17   Valda Lamb, MD  gabapentin (NEURONTIN) 300 MG capsule Take 1 capsule (300 mg total) by mouth 3 (three) times daily. Patient not taking: Reported on 03/12/2017 01/25/17   Heath Lark, MD    Family History Family History  Problem Relation Age of Onset  . Hypertension Mother   . Anxiety disorder Mother   . Diabetes Mother   . Cancer Father     Social History Social History  Substance Use Topics  . Smoking status: Former Smoker    Packs/day: 0.50    Years: 30.00    Types: Cigarettes  . Smokeless tobacco: Never Used  . Alcohol use No     Comment: quit about 3 months ago, drank beer (he tells me a lot of beer)     Allergies   Patient has no known allergies.   Review of Systems Review of Systems All other systems are reviewed and are negative for acute change except as noted in the HPI   Physical Exam Updated Vital Signs BP 116/76 (BP Location: Right Arm)   Pulse (!) 54   Temp 98.9 F (37.2 C) (Oral)   Resp 17   Ht 6' 1.5" (1.867 m)   Wt 63.5 kg (140 lb)   SpO2 100%   BMI 18.22 kg/m   Physical Exam  Constitutional: He is oriented to person, place, and time. He appears well-developed and well-nourished. No distress.  HENT:  Head: Normocephalic and atraumatic.  Right Ear: External ear normal.  Left Ear: External ear normal.  Nose: Nose normal.  Mouth/Throat: Mucous membranes are normal. No trismus in the jaw.  Discomfort with palpation of the maxillary sinuses bilaterally.  Eyes: Conjunctivae and EOM are normal. No scleral icterus.  Neck: Normal  range of motion and phonation normal.  Cardiovascular: Normal rate and regular rhythm.   Pulmonary/Chest: Effort normal. No stridor. No respiratory distress.  Abdominal: He exhibits no distension.  Musculoskeletal: Normal range of motion. He exhibits no edema.  Neurological: He is alert and oriented to person, place, and time.  Mental Status: Alert and oriented to person, place, and time. Attention and concentration normal. Speech clear. Recent memory is intact  Cranial Nerves  II Visual Fields: Intact to confrontation. Visual fields intact. III, IV, VI: Pupils equal and reactive to light and near. Full eye movement without nystagmus  V Facial Sensation: Normal. No weakness of masticatory muscles  VII: No facial weakness or asymmetry  VIII Auditory Acuity: Grossly normal  IX/X: The uvula is midline; the palate elevates symmetrically  XI: Normal sternocleidomastoid and trapezius strength  XII: The tongue is midline. No atrophy or fasciculations.   Motor System: Muscle  Strength: 5/5 and symmetric in the upper and lower extremities. No pronation or drift.  Muscle Tone: Tone and muscle bulk are normal in the upper and lower extremities.   Reflexes: DTRs: 1+ and symmetrical in all four extremities. Plantar responses are flexor bilaterally.  Coordination: Intact finger-to-nose, heel-to-shin. No tremor.  Sensation: Intact to light touch, and pinprick. Gait: Routine gait normal.    Skin: He is not diaphoretic.  Psychiatric: He has a normal mood and affect. His behavior is normal.  Vitals reviewed.    ED Treatments / Results  Labs (all labs ordered are listed, but only abnormal results are displayed) Labs Reviewed - No data to display  EKG  EKG Interpretation None       Radiology No results found.  Procedures Procedures (including critical care time)  Medications Ordered in ED Medications  ketorolac (TORADOL) injection 30 mg (30 mg Intramuscular Given 03/27/17 1936)      Initial Impression / Assessment and Plan / ED Course  I have reviewed the triage vital signs and the nursing notes.  Pertinent labs & imaging results that were available during my care of the patient were reviewed by me and considered in my medical decision making (see chart for details).     Non focal neuro exam. No papilledema. No recent head trauma. No fever. Doubt meningitis. Doubt intracranial bleed. Doubt IIH. Doubt glaucoma or temporal arteritis. Low suspicion for cavernous sinus/venous sinus thrombosis.  MRI obtained one and a half years ago without evidence of metastatic brain disease.  No indication for imaging. The patient is safe for discharge with strict return precautions.  Headache improved following IM Toradol.  The patient is safe for discharge with strict return precautions.  Final Clinical Impressions(s) / ED Diagnoses   Final diagnoses:  Sinus headache  Rhinitis medicamentosa   Disposition: Discharge  Condition: Good  I have discussed the results, Dx and Tx plan with the patient who expressed understanding and agree(s) with the plan. Discharge instructions discussed at great length. The patient was given strict return precautions who verbalized understanding of the instructions. No further questions at time of discharge.    New Prescriptions   No medications on file    Follow Up: [facility], [place] [address] [phone]  Schedule an appointment as soon as possible for a visit in 1 week If symptoms do not improve or  worsen      Sarrinah Gardin, Grayce Sessions, MD 03/27/17 2016

## 2017-03-27 NOTE — ED Provider Notes (Deleted)
Medical screening examination/treatment/procedure(s) were conducted as a shared visit with non-physician practitioner(s) and myself.  I personally evaluated the patient during the encounter. Briefly, the patient is a 61 y.o. male who presents to the ED with several weeks of frontal and maxillary headache. Patient has been treated for sinusitis with Augmentin, and azithromycin. Patient also put on allergy medicine. He reports that this has had minimal improvement in his symptomatology. Denies any fevers, vision changes, focal deficits, trauma. Non focal neuro exam. No papilledema. No recent head trauma. No fever. Doubt meningitis. Doubt intracranial bleed. Doubt IIH. Doubt glaucoma or temporal arteritis. No indication for imaging. The patient is safe for discharge with strict return precautions.      Fatima Blank, MD 03/27/17 [phone]

## 2017-04-10 ENCOUNTER — Telehealth: Payer: Self-pay | Admitting: *Deleted

## 2017-04-10 ENCOUNTER — Encounter (HOSPITAL_COMMUNITY): Payer: Self-pay

## 2017-04-10 ENCOUNTER — Emergency Department (HOSPITAL_COMMUNITY): Payer: Medicaid Other

## 2017-04-10 ENCOUNTER — Emergency Department (HOSPITAL_COMMUNITY)
Admission: EM | Admit: 2017-04-10 | Discharge: 2017-04-11 | Disposition: A | Discharge status: Home / Self Care | Payer: Medicaid Other | Attending: Emergency Medicine | Admitting: Emergency Medicine

## 2017-04-10 DIAGNOSIS — R51 Headache: Secondary | ICD-10-CM | POA: Diagnosis present

## 2017-04-10 DIAGNOSIS — Z5321 Procedure and treatment not carried out due to patient leaving prior to being seen by health care provider: Secondary | ICD-10-CM | POA: Insufficient documentation

## 2017-04-10 DIAGNOSIS — R079 Chest pain, unspecified: Secondary | ICD-10-CM | POA: Insufficient documentation

## 2017-04-10 LAB — I-STAT TROPONIN, ED: Troponin i, poc: 0.01 ng/mL (ref 0.00–0.08)

## 2017-04-10 LAB — CBC
HEMATOCRIT: 38.4 % — AB (ref 39.0–52.0)
Hemoglobin: 13.2 g/dL (ref 13.0–17.0)
MCH: 33.1 pg (ref 26.0–34.0)
MCHC: 34.4 g/dL (ref 30.0–36.0)
MCV: 96.2 fL (ref 78.0–100.0)
PLATELETS: 171 10*3/uL (ref 150–400)
RBC: 3.99 MIL/uL — AB (ref 4.22–5.81)
RDW: 12.5 % (ref 11.5–15.5)
WBC: 5.8 10*3/uL (ref 4.0–10.5)

## 2017-04-10 LAB — BASIC METABOLIC PANEL
ANION GAP: 12 (ref 5–15)
BUN: 12 mg/dL (ref 6–20)
CHLORIDE: 100 mmol/L — AB (ref 101–111)
CO2: 24 mmol/L (ref 22–32)
Calcium: 9.5 mg/dL (ref 8.9–10.3)
Creatinine, Ser: 1.42 mg/dL — ABNORMAL HIGH (ref 0.61–1.24)
GFR calc Af Amer: >60 mL/min (ref >60)
GFR calc non Af Amer: 53 mL/min — ABNORMAL LOW (ref >60)
Glucose, Bld: 87 mg/dL (ref 65–99)
POTASSIUM: 4.8 mmol/L (ref 3.5–5.1)
SODIUM: 136 mmol/L (ref 135–145)

## 2017-04-10 NOTE — ED Triage Notes (Signed)
Pt states he has had headache X1 month. He states he has been seen here for same and prescribed meds for allergies/sinuitis without any improvement. Pt also reports left side chest pain. Skin warm and dry. No distress noted.

## 2017-04-10 NOTE — Telephone Encounter (Signed)
Oncology Nurse Navigator Documentation  Received call from Mr Medearis reporting new onset 2-3 weeks ago of sharp facial pain rated 10/10 that radiates to top of head, bilateral ears.  He has been taking OTC motrin and tylenol which resolves pain temporarily.  I noted he completed chemo therapy 07/03/16, RT 07/10/16, recommended he call ENT Dr. Noreene Filbert office to request appt with on-call ENT or Dr. Erik Obey ASAP for evaluation rather than going to ED.  He voiced understanding.  Gayleen Orem, RN, BSN, [place] Oncology Nurse [place] at [place] [phone]

## 2017-04-11 ENCOUNTER — Other Ambulatory Visit: Payer: Self-pay | Admitting: Radiation Oncology

## 2017-04-11 DIAGNOSIS — C090 Malignant neoplasm of tonsillar fossa: Secondary | ICD-10-CM

## 2017-04-11 MED FILL — TRAVATAN Z 0.004% EYE DROP: 0.004 | 25 days supply | Qty: 3 | Fill #0

## 2017-04-11 NOTE — Telephone Encounter (Signed)
Oncology Nurse Navigator Documentation  In follow-up to his previous call, patient called stating he was told by [facility] ENT he needs a referral before he can be seen.  He understands I will notify Dr. Isidore Moos.  Gayleen Orem, RN, BSN, [place] Oncology Nurse [place] at [place] [phone]

## 2017-04-12 ENCOUNTER — Telehealth: Payer: Self-pay | Admitting: *Deleted

## 2017-04-12 NOTE — Telephone Encounter (Signed)
Oncology Nurse Navigator Documentation  Received call from [name] [name] indicating referral has been received by [facility] ENT, he has appt to see Dr. Erik Obey tomorrow.  Gayleen Orem, RN, BSN, [place] [facility] Oncology Nurse [place] at [place] [phone]

## 2017-04-13 ENCOUNTER — Telehealth: Payer: Self-pay | Admitting: *Deleted

## 2017-04-13 NOTE — Telephone Encounter (Addendum)
Oncology Nurse Navigator Documentation  Received call from [name] s/p his appt this afternoon with ENT Dr. Erik Obey. He reported Dr. Erik Obey recommended:  CT Neck and Head for further evaluation of current symptoms.  Evaluation by Dr. Enrique Sack, [facility].  [name] indicated he would call on Monday to arrange appt.  Attendance at [facility] for support and better understanding of post-tmt recovery.  I mentioned the upcoming September program, told him I would mail information, encouraged him to attend. I thanked him for update, informed Dr. Isidore Moos.  Gayleen Orem, RN, BSN, [place] [facility] Oncology Nurse [place] at [place] [phone]

## 2017-04-15 NOTE — Progress Notes (Signed)
[facility] Note  04/16/2017     CHIEF COMPLAINT Patient presents for Retina Evaluation   HISTORY OF PRESENT ILLNESS: [name] [name] is a 61 y.o. male who presents to the clinic today for:   HPI    Retina Evaluation  In right eye.     Last edited by Bernarda Caffey, MD on 04/15/2017 11:56 PM. (History)      Referring physician: Rogers Blocker, MD [place], [place] [address]  HISTORICAL INFORMATION:   Selected notes from the MEDICAL RECORD NUMBER Referral from Dr. 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 (Ophthalmic Drugs)   No current facility-administered medications for this visit.  (Ophthalmic Drugs)   Current Outpatient Prescriptions (Other)  Medication Sig  . amoxicillin-clavulanate (AUGMENTIN) 875-125 MG tablet Take 1 tablet by mouth every 12 (twelve) hours. (Patient not taking: Reported on 03/27/2017)  . azithromycin (ZITHROMAX) 250 MG tablet Take 250-500 mg by mouth daily. 500 mg for day 1, 250 mg for 4 days, started on 03-21-17  . fluticasone (FLONASE) 50 MCG/ACT nasal spray Place 1 spray into both nostrils daily.  Marland Kitchen gabapentin (NEURONTIN) 300 MG capsule Take 1 capsule (300 mg total) by mouth 3 (three) times daily. (Patient not taking: Reported on 03/12/2017)  . montelukast (SINGULAIR) 10 MG tablet Take 10 mg by mouth at bedtime.   Current Facility-Administered Medications (Other)  Medication Route  . dexamethasone (DECADRON) injection 10 mg Intravenous  . LORazepam (ATIVAN) injection 0.5 mg Intravenous   Facility-Administered Medications Ordered in Other Visits (Other)  Medication Route  . sodium chloride flush (NS) 0.9 % injection 10 mL Intracatheter      REVIEW OF  SYSTEMS:    ALLERGIES No Known Allergies  PAST MEDICAL HISTORY Past Medical History:  Diagnosis Date  . Anxiety   . Cancer ([place]) 04/04/2016   cancer of left tonsil  . CKD (chronic kidney disease), stage III   . COPD (chronic obstructive pulmonary disease) ([place])   . Hepatitis   . Hepatitis C    Treatment x 8 weeks 6-7 months ago Harvoni  . History of radiation therapy 05/23/16- 07/10/16   Left Tonsil and bilateral neck  . Sinus congestion   . Stomach ulcer    Past Surgical History:  Procedure Laterality Date  . COLONOSCOPY WITH PROPOFOL N/A 04/14/2015   Procedure: COLONOSCOPY WITH PROPOFOL;  Surgeon: Arta Silence, MD;  Location: [facility];  Service: Endoscopy;  Laterality: N/A;  . HERNIA REPAIR    . IR GENERIC HISTORICAL  05/17/2016   IR FLUORO GUIDE PORT INSERTION RIGHT 05/17/2016 Jacqulynn Cadet, MD [facility]  . IR GENERIC HISTORICAL  05/17/2016   IR US GUIDE VASC ACCESS RIGHT 05/17/2016 Jacqulynn Cadet, MD [facility]  . IR GENERIC HISTORICAL  05/17/2016   IR GASTROSTOMY TUBE MOD SED 05/17/2016 Jacqulynn Cadet, MD [facility]  . IR GENERIC HISTORICAL  11/13/2016   IR GASTROSTOMY TUBE REMOVAL 11/13/2016 Greggory Keen, MD [facility]  . IR GENERIC HISTORICAL  11/13/2016   IR REMOVAL TUN ACCESS W/ PORT W/O FL MOD SED 11/13/2016 Greggory Keen, MD [facility]  . MULTIPLE EXTRACTIONS WITH ALVEOLOPLASTY N/A 05/03/2016   Procedure: Multiple extractions with alveoloplasty and gross debridement of remaining teeth.;  Surgeon: Lenn Cal, DDS;  Location: [facility];  Service: Oral Surgery;  Laterality: N/A;  . STOMACH SURGERY     20 years    FAMILY HISTORY Family History  Problem Relation Age of Onset  . Hypertension Mother   . Anxiety disorder Mother   . Diabetes Mother   . Cancer Father     SOCIAL HISTORY Social History  Substance Use Topics  . Smoking status: Former Smoker    Packs/day: 0.50    Years: 30.00    Types: Cigarettes  . Smokeless tobacco: Never  Used  . Alcohol use No     Comment: quit about 3 months ago, drank beer (he tells me a lot of beer)         OPHTHALMIC EXAM:   Not recorded      IMAGING AND PROCEDURES  Imaging and Procedures for 04/16/17           ASSESSMENT/PLAN:    ICD-10-CM   1. Retinal hole of right eye H33.321   2. 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 Primary open angle glaucoma (POAG) of both eyes, moderate stage H40.1132   4. Nuclear sclerosis of both eyes H25.13   5. Retinal edema H35.81 OCT, Retina - OU - Both Eyes    1.  Retinal holes OD  2.  Lattice Degeneration  3.  POAG OU  4.  [place] OU  5.  No retinal edema  Ophthalmic Meds Ordered this visit:  No orders of the defined types were placed in this encounter.      No Follow-up on file.  There are no Patient Instructions on file for this visit.   Explained the diagnoses, plan, and follow up with the patient and they expressed understanding.  Patient expressed understanding of the importance of proper follow up care.   Gardiner Sleeper, M.D., Ph.D. Vitreoretinal Surgeon [facility] 04/16/17     Abbreviations: M myopia (nearsighted); A astigmatism; H hyperopia (farsighted); P presbyopia; Mrx spectacle prescription;  CTL contact lenses; OD right eye; OS left eye; OU both eyes  XT exotropia; ET esotropia; PEK punctate epithelial keratitis; PEE punctate epithelial erosions; DES dry eye syndrome; MGD meibomian gland dysfunction; ATs artificial tears; PFAT's preservative free artificial tears; [place] nuclear sclerotic cataract; PSC posterior subcapsular cataract; ERM epi-retinal membrane; PVD posterior vitreous detachment; RD retinal detachment; DM diabetes mellitus; DR diabetic retinopathy; NPDR non-proliferative diabetic retinopathy; PDR proliferative diabetic retinopathy; CSME clinically significant macular edema; DME diabetic macular edema; dbh dot blot hemorrhages; CWS cotton wool  spot; POAG primary open angle glaucoma; C/D cup-to-disc ratio; HVF humphrey visual field; GVF goldmann visual field; OCT optical coherence tomography; IOP intraocular pressure; BRVO Branch retinal vein occlusion; CRVO central retinal vein occlusion; CRAO central retinal artery occlusion; BRAO branch retinal artery occlusion; RT retinal tear; SB scleral buckle; PPV pars plana vitrectomy; VH Vitreous hemorrhage; PRP panretinal laser photocoagulation; IVK intravitreal kenalog; VMT vitreomacular traction; MH Macular hole;  NVD neovascularization of the disc; NVE neovascularization elsewhere; AREDS age related eye disease study; ARMD age related macular degeneration; POAG primary open angle glaucoma; EBMD epithelial/anterior basement membrane dystrophy; ACIOL anterior chamber intraocular lens; IOL intraocular lens; PCIOL posterior chamber intraocular lens; Phaco/IOL phacoemulsification with intraocular lens placement; [place] photorefractive keratectomy; LASIK laser assisted in situ keratomileusis; HTN hypertension; DM diabetes mellitus; COPD chronic obstructive pulmonary disease  This encounter was created in error - please disregard.

## 2017-04-16 ENCOUNTER — Encounter (INDEPENDENT_AMBULATORY_CARE_PROVIDER_SITE_OTHER): Payer: Self-pay | Admitting: Ophthalmology

## 2017-04-16 ENCOUNTER — Other Ambulatory Visit: Payer: Self-pay | Admitting: Internal Medicine

## 2017-04-16 DIAGNOSIS — J019 Acute sinusitis, unspecified: Secondary | ICD-10-CM

## 2017-04-17 ENCOUNTER — Encounter (HOSPITAL_COMMUNITY): Payer: Self-pay | Admitting: Emergency Medicine

## 2017-04-17 ENCOUNTER — Emergency Department (HOSPITAL_COMMUNITY)
Admission: EM | Admit: 2017-04-17 | Discharge: 2017-04-17 | Disposition: A | Discharge status: Home / Self Care | Payer: Medicaid Other | Attending: Emergency Medicine | Admitting: Emergency Medicine

## 2017-04-17 ENCOUNTER — Other Ambulatory Visit: Payer: Self-pay

## 2017-04-17 ENCOUNTER — Ambulatory Visit
Admission: RE | Admit: 2017-04-17 | Discharge: 2017-04-17 | Disposition: A | Discharge status: Home / Self Care | Payer: Medicaid Other | Source: Ambulatory Visit | Attending: Internal Medicine | Admitting: Internal Medicine

## 2017-04-17 ENCOUNTER — Emergency Department (HOSPITAL_COMMUNITY): Payer: Medicaid Other

## 2017-04-17 DIAGNOSIS — R0602 Shortness of breath: Secondary | ICD-10-CM

## 2017-04-17 DIAGNOSIS — T701XXA Sinus barotrauma, initial encounter: Secondary | ICD-10-CM | POA: Insufficient documentation

## 2017-04-17 DIAGNOSIS — F419 Anxiety disorder, unspecified: Secondary | ICD-10-CM | POA: Insufficient documentation

## 2017-04-17 DIAGNOSIS — Z85819 Personal history of malignant neoplasm of unspecified site of lip, oral cavity, and pharynx: Secondary | ICD-10-CM | POA: Diagnosis not present

## 2017-04-17 DIAGNOSIS — Z87891 Personal history of nicotine dependence: Secondary | ICD-10-CM | POA: Diagnosis not present

## 2017-04-17 DIAGNOSIS — N183 Chronic kidney disease, stage 3 (moderate): Secondary | ICD-10-CM | POA: Insufficient documentation

## 2017-04-17 DIAGNOSIS — J449 Chronic obstructive pulmonary disease, unspecified: Secondary | ICD-10-CM | POA: Insufficient documentation

## 2017-04-17 DIAGNOSIS — J3489 Other specified disorders of nose and nasal sinuses: Secondary | ICD-10-CM

## 2017-04-17 DIAGNOSIS — J019 Acute sinusitis, unspecified: Secondary | ICD-10-CM

## 2017-04-17 MED ORDER — ALBUTEROL SULFATE (2.5 MG/3ML) 0.083% IN NEBU
INHALATION_SOLUTION | RESPIRATORY_TRACT | Status: AC
  Filled 2017-04-17: qty 6

## 2017-04-17 NOTE — Progress Notes (Deleted)
[facility] Note  04/23/2017     CHIEF COMPLAINT Patient presents for No chief complaint on file.   HISTORY OF PRESENT ILLNESS: [name] [name] is a 61 y.o. male who presents to the clinic today for:     Referring physician: Rogers Blocker, MD [address] [place], [place] [address]  HISTORICAL INFORMATION:   Selected notes from the [place] Referral from Dr. 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 Travoprost, BAK Free, (TRAVATAN Z) 0.004 % SOLN ophthalmic solution Place 1 drop into both eyes at bedtime.   No current facility-administered medications for this visit.  (Ophthalmic Drugs)   Current Outpatient Prescriptions (Other)  Medication Sig  . amoxicillin-clavulanate (AUGMENTIN) 875-125 MG tablet Take 1 tablet by mouth every 12 (twelve) hours. (Patient not taking: Reported on 03/27/2017)  . gabapentin (NEURONTIN) 300 MG capsule Take 1 capsule (300 mg total) by mouth 3 (three) times daily. (Patient not taking: Reported on 03/12/2017)   Current Facility-Administered Medications (Other)  Medication Route  . dexamethasone (DECADRON) injection 10 mg Intravenous  . LORazepam (ATIVAN) injection 0.5 mg Intravenous   Facility-Administered Medications Ordered in Other Visits (Other)  Medication Route  . sodium chloride flush (NS) 0.9 % injection 10 mL Intracatheter      REVIEW OF SYSTEMS:    ALLERGIES No Known Allergies  PAST MEDICAL HISTORY Past Medical History:  Diagnosis Date  . Anxiety   . Cancer ([place]) 04/04/2016   cancer of left tonsil  . CKD (chronic kidney disease), stage III   . COPD (chronic obstructive pulmonary disease) ([place])   . Hepatitis   .  Hepatitis C    Treatment x 8 weeks 6-7 months ago Harvoni  . History of radiation therapy 05/23/16- 07/10/16   Left Tonsil and bilateral neck  . Sinus congestion   . Stomach ulcer    Past Surgical History:  Procedure Laterality Date  . COLONOSCOPY WITH PROPOFOL N/A 04/14/2015   Procedure: COLONOSCOPY WITH PROPOFOL;  Surgeon: Arta Silence, MD;  [facility];  Service: Endoscopy;  Laterality: N/A;  . HERNIA REPAIR    . IR GENERIC HISTORICAL  05/17/2016   IR FLUORO GUIDE PORT INSERTION RIGHT 05/17/2016 Jacqulynn Cadet, MD [facility]  . IR GENERIC HISTORICAL  05/17/2016   IR US GUIDE VASC ACCESS RIGHT 05/17/2016 Jacqulynn Cadet, MD [facility]  . IR GENERIC HISTORICAL  05/17/2016   IR GASTROSTOMY TUBE MOD SED 05/17/2016 Jacqulynn Cadet, MD [facility]  . IR GENERIC HISTORICAL  11/13/2016   IR GASTROSTOMY TUBE REMOVAL 11/13/2016 Greggory Keen, MD [facility]  . IR GENERIC HISTORICAL  11/13/2016   IR REMOVAL TUN ACCESS W/ PORT W/O FL MOD SED 11/13/2016 Greggory Keen, MD [facility]  . MULTIPLE EXTRACTIONS WITH ALVEOLOPLASTY N/A 05/03/2016   Procedure: Multiple extractions with alveoloplasty and gross debridement of remaining teeth.;  Surgeon: Lenn Cal, DDS;  Location[facility];  Service: Oral Surgery;  Laterality: N/A;  . STOMACH SURGERY     20 years    FAMILY HISTORY Family History  Problem Relation Age of Onset  . Hypertension Mother   . Anxiety disorder Mother   . Diabetes Mother   . Cancer Father     SOCIAL HISTORY  Social History  Substance Use Topics  . Smoking status: Former Smoker    Packs/day: 0.50    Years: 30.00    Types: Cigarettes  . Smokeless tobacco: Never Used  . Alcohol use No     Comment: quit about 3 months ago, drank beer (he tells me a lot of beer)         OPHTHALMIC EXAM:   Not recorded      IMAGING AND PROCEDURES  Imaging and Procedures for 04/22/17           ASSESSMENT/PLAN:    ICD-10-CM   1. Retinal hole of  right eye H33.321   2. Lattice degeneration of both retinas H35.413   3. Primary open angle glaucoma (POAG) of both eyes, moderate stage H40.1132   4. Nuclear sclerosis of both eyes H25.13   5. Retinal edema H35.81     1.  Retinal holes OD  2.  Lattice Degeneration  3.  POAG OU  4.  [place] OU  5.  No retinal edema  Ophthalmic Meds Ordered this visit:  No orders of the defined types were placed in this encounter.      No Follow-up on file.  There are no Patient Instructions on file for this visit.   Explained the diagnoses, plan, and follow up with the patient and they expressed understanding.  Patient expressed understanding of the importance of proper follow up care.   Gardiner Sleeper, M.D., Ph.D. Vitreoretinal Surgeon [facility] 04/22/17     Abbreviations: M myopia (nearsighted); A astigmatism; H hyperopia (farsighted); P presbyopia; Mrx spectacle prescription;  CTL contact lenses; OD right eye; OS left eye; OU both eyes  XT exotropia; ET esotropia; PEK punctate epithelial keratitis; PEE punctate epithelial erosions; DES dry eye syndrome; MGD meibomian gland dysfunction; ATs artificial tears; PFAT's preservative free artificial tears; [place] nuclear sclerotic cataract; PSC posterior subcapsular cataract; ERM epi-retinal membrane; PVD posterior vitreous detachment; RD retinal detachment; DM diabetes mellitus; DR diabetic retinopathy; NPDR non-proliferative diabetic retinopathy; PDR proliferative diabetic retinopathy; CSME clinically significant macular edema; DME diabetic macular edema; dbh dot blot hemorrhages; CWS cotton wool spot; POAG primary open angle glaucoma; C/D cup-to-disc ratio; HVF humphrey visual field; GVF goldmann visual field; OCT optical coherence tomography; IOP intraocular pressure; BRVO Branch retinal vein occlusion; CRVO central retinal vein occlusion; CRAO central retinal artery occlusion; BRAO branch retinal artery occlusion; RT retinal  tear; SB scleral buckle; PPV pars plana vitrectomy; VH Vitreous hemorrhage; PRP panretinal laser photocoagulation; IVK intravitreal kenalog; VMT vitreomacular traction; MH Macular hole;  NVD neovascularization of the disc; NVE neovascularization elsewhere; AREDS age related eye disease study; ARMD age related macular degeneration; POAG primary open angle glaucoma; EBMD epithelial/anterior basement membrane dystrophy; ACIOL anterior chamber intraocular lens; IOL intraocular lens; PCIOL posterior chamber intraocular lens; Phaco/IOL phacoemulsification with intraocular lens placement; [place] photorefractive keratectomy; LASIK laser assisted in situ keratomileusis; HTN hypertension; DM diabetes mellitus; COPD chronic obstructive pulmonary disease

## 2017-04-17 NOTE — ED Provider Notes (Signed)
[facility] DEPT Provider Note   CSN: [id number] Arrival date & time: 04/17/17  0208     History   Chief Complaint Chief Complaint  Patient presents with  . Shortness of Breath  . Headache    HPI [name] [name] is a 61 y.o. male with history of COPD, hepatitis C, chronic sinus congestion who presents with shortness of breath that began early this morning and 1 month history of sinus headache. Patient reports he was woken up this morning to bring his mother to the hospital. He then began having shortness of breath and trouble catching his breath. He reports that has resolved now and he is breathing at baseline. Patient denies any chest pain now or earlier. He reports he is seeing an ear nose and throat doctor for his chronic sinus congestion and pain and has imaging this morning to assess it. He reports associated intermittent R ear pain. He has taken Augmentin, intranasal steroids without relief. He reports he came here with his mother and thought he should get checked out as well. He denies any current chest pain, shortness of breath, fevers, abdominal pain, nausea, vomiting, urinary symptoms.  HPI  Past Medical History:  Diagnosis Date  . Anxiety   . Cancer ([place]) 04/04/2016   cancer of left tonsil  . CKD (chronic kidney disease), stage III   . COPD (chronic obstructive pulmonary disease) ([place])   . Hepatitis   . Hepatitis C    Treatment x 8 weeks 6-7 months ago Harvoni  . History of radiation therapy 05/23/16- 07/10/16   Left Tonsil and bilateral neck  . Sinus congestion   . Stomach ulcer     Patient Active Problem List   Diagnosis Date Noted  . Multiple lung nodules on CT 12/05/2016  . 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 Multifocal pneumonia 10/04/2016  . Chronic kidney disease, stage III (moderate) 09/14/2016  . Dry mouth 09/14/2016  . Dysgeusia 09/05/2016  . Dehydration 09/01/2016  . Bronchitis 09/01/2016  .  Diarrhea 09/01/2016  . Anemia due to chronic illness 08/24/2016  . Acute prerenal failure ([place]) 07/24/2016  . Hemoptysis 07/24/2016  . Pancytopenia, acquired ([place]) 07/04/2016  . Radiation dermatitis 07/04/2016  . Protein-calorie malnutrition, moderate ([place]) 06/27/2016  . Atypical chest pain 06/27/2016  . Bilateral tinnitus 05/30/2016  . Chemotherapy-induced nausea 05/30/2016  . Constipation, acute 05/30/2016  . Unintentional weight loss 05/30/2016  . Hypersensitivity reaction 05/23/2016  . S/P gastrostomy ([place]) 05/18/2016  . Cancer associated pain 05/18/2016  . Lesion of right lung 04/26/2016  . 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[place]) 04/24/2016  . History of hepatitis C 04/24/2016  . Chest pain 09/17/2014  . Anxiety   . Dyspnea 03/25/2014  . COPD GOLD II 07/25/2013  . Sinusitis, chronic 07/25/2013  . Smoker 07/25/2013    Past Surgical History:  Procedure Laterality Date  . COLONOSCOPY WITH PROPOFOL N/A 04/14/2015   Procedure: COLONOSCOPY WITH PROPOFOL;  Surgeon: Arta Silence, MD;  [facility];  Service: Endoscopy;  Laterality: N/A;  . HERNIA REPAIR    . IR GENERIC HISTORICAL  05/17/2016   IR FLUORO GUIDE PORT INSERTION RIGHT 05/17/2016 Jacqulynn Cadet, MD [facility]  . IR GENERIC HISTORICAL  05/17/2016   IR US GUIDE VASC ACCESS RIGHT 05/17/2016 Jacqulynn Cadet, MD [facility]  . IR GENERIC HISTORICAL  05/17/2016   IR GASTROSTOMY TUBE MOD SED 05/17/2016 Jacqulynn Cadet, MD [facility]  . IR GENERIC HISTORICAL  11/13/2016  IR GASTROSTOMY TUBE REMOVAL 11/13/2016 Greggory Keen, MD [facility]  . IR GENERIC HISTORICAL  11/13/2016   IR REMOVAL TUN ACCESS W/ PORT W/O FL MOD SED 11/13/2016 Greggory Keen, MD [facility]  . MULTIPLE EXTRACTIONS WITH ALVEOLOPLASTY N/A 05/03/2016   Procedure: Multiple extractions with alveoloplasty and gross debridement of remaining teeth.;  Surgeon: Lenn Cal, DDS;  Location[facility];  Service: Oral Surgery;  Laterality:  N/A;  . STOMACH SURGERY     20 years       Home Medications    Prior to Admission medications   Medication Sig Start Date End Date Taking? Authorizing Provider  Travoprost, BAK Free, (TRAVATAN Z) 0.004 % SOLN ophthalmic solution Place 1 drop into both eyes at bedtime. 04/11/17  Yes [provider]  amoxicillin-clavulanate (AUGMENTIN) 875-125 MG tablet Take 1 tablet by mouth every 12 (twelve) hours. Patient not taking: Reported on 03/27/2017 03/12/17   Valda Lamb, MD  gabapentin (NEURONTIN) 300 MG capsule Take 1 capsule (300 mg total) by mouth 3 (three) times daily. Patient not taking: Reported on 03/12/2017 01/25/17   Heath Lark, MD    Family History Family History  Problem Relation Age of Onset  . Hypertension Mother   . Anxiety disorder Mother   . Diabetes Mother   . Cancer Father     Social History Social History  Substance Use Topics  . Smoking status: Former Smoker    Packs/day: 0.50    Years: 30.00    Types: Cigarettes  . Smokeless tobacco: Never Used  . Alcohol use No     Comment: quit about 3 months ago, drank beer (he tells me a lot of beer)     Allergies   Patient has no known allergies.   Review of Systems Review of Systems  Constitutional: Negative for chills and fever.  HENT: Positive for ear pain, sinus pain and sinus pressure. Negative for facial swelling and sore throat.   Respiratory: Positive for shortness of breath (resolved).   Cardiovascular: Negative for chest pain.  Gastrointestinal: Negative for abdominal pain, nausea and vomiting.  Genitourinary: Negative for dysuria.  Musculoskeletal: Negative for back pain.  Skin: Negative for rash and wound.  Neurological: Negative for headaches.  Psychiatric/Behavioral: The patient is not nervous/anxious.      Physical Exam Updated Vital Signs BP 107/82   Pulse 71   Temp 98.4 F (36.9 C) (Oral)   Resp 15   Ht 6\' 1"  (1.854 m)   Wt 61.2 kg (135 lb)   SpO2 99%   BMI 17.81 kg/m    Physical Exam  Constitutional: He appears well-developed and well-nourished. No distress.  HENT:  Head: Normocephalic and atraumatic.  Right Ear: Tympanic membrane normal.  Left Ear: Tympanic membrane normal.  Mouth/Throat: Oropharynx is clear and moist. No oropharyngeal exudate.  No tenderness on palpation of the sinuses  Eyes: Pupils are equal, round, and reactive to light. Conjunctivae are normal. Right eye exhibits no discharge. Left eye exhibits no discharge. No scleral icterus.  Neck: Normal range of motion. Neck supple. No thyromegaly present.  Cardiovascular: Normal rate, regular rhythm, normal heart sounds and intact distal pulses.  Exam reveals no gallop and no friction rub.   No murmur heard. Pulmonary/Chest: Effort normal and breath sounds normal. No stridor. No respiratory distress. He has no wheezes. He has no rales.  Abdominal: Soft. Bowel sounds are normal. He exhibits no distension. There is no tenderness. There is no rebound and no guarding.  Musculoskeletal: He exhibits  no edema.  Lymphadenopathy:    He has no cervical adenopathy.  Neurological: He is alert. Coordination normal.  Skin: Skin is warm and dry. No rash noted. He is not diaphoretic. No pallor.  Psychiatric: He has a normal mood and affect.  Nursing note and vitals reviewed.    ED Treatments / Results  Labs (all labs ordered are listed, but only abnormal results are displayed) Labs Reviewed - No data to display  EKG  EKG Interpretation None       Radiology Dg Chest 2 View  Result Date: 04/17/2017 CLINICAL DATA:  Shortness of breath EXAM: CHEST  2 VIEW COMPARISON:  04/10/2017, 10/26/2016 FINDINGS: Hyperinflation. No acute infiltrate or effusion. Stable cardiomediastinal silhouette. Vague right apical opacity corresponding to the PET CT abnormality. IMPRESSION: No active cardiopulmonary disease. Electronically Signed   By: Donavan Foil M.D.   On: 04/17/2017 02:46    Procedures Procedures  (including critical care time)  Medications Ordered in ED Medications - No data to display   Initial Impression / Assessment and Plan / ED Course  I have reviewed the triage vital signs and the nursing notes.  Pertinent labs & imaging results that were available during my care of the patient were reviewed by me and considered in my medical decision making (see chart for details).     Patient with resolved shortness of breath, most likely anxiety related to patient's mother who is very sick. CXR shows no active pulmonary disease. EKG shows NSR, incomplete RBBB, rightward axis. Considering patient back to baseline, I do not feel labs are necessary at this time. We'll discharge patient home so he can have the imaging he needs to evaluate his chronic nasal congestion causing his sinus headaches. Return precautions discussed. Patient understands and agrees the plan. Patient vitals stable throughout ED course and discharged in satisfactory condition. I discussed patient case with Dr. Christy Gentles who guided the patient's management and agrees with plan.   Final Clinical Impressions(s) / ED Diagnoses   Final diagnoses:  Shortness of breath  Sinus pressure    New Prescriptions New Prescriptions   No medications on file     Caryl Ada 04/17/17 0708    Virgel Manifold, MD 04/17/17 1110

## 2017-04-17 NOTE — ED Provider Notes (Signed)
ED ECG REPORT   Date: 04/17/2017 0213am  Rate: 81  Rhythm: normal sinus rhythm  QRS Axis: right  Intervals: normal  ST/T Wave abnormalities: t wave inversion  Conduction Disutrbances:right bundle branch block  Narrative Interpretation:   Old EKG Reviewed: unchanged  I have personally reviewed the EKG tracing and agree with the computerized printout as noted.    Ripley Fraise, MD 04/17/17 [id number]

## 2017-04-17 NOTE — Discharge Instructions (Signed)
Please follow up with your primary care provider as well as your ENT for further evaluation and treatment of your symptoms. You can follow up with the health department for STD testing. Please return to the emergency department if you develop any new or worsening symptoms.

## 2017-04-17 NOTE — Progress Notes (Signed)
Order(s) created erroneously. Erroneous order ID: [id number]  Order moved by: Milas Hock  Order move date/time: 04/17/2017 5:26 PM  Source Patient: [id number]  Source Contact: 04/16/2017  Destination Patient: [id number]  Destination Contact: 11/19/2012

## 2017-04-17 NOTE — ED Triage Notes (Signed)
Pt presents to ED with mother for assessment of shortgness of breath when bringing his mother to the ED, as well as a headache x 3 days.  NAD at triage.

## 2017-04-23 ENCOUNTER — Encounter (INDEPENDENT_AMBULATORY_CARE_PROVIDER_SITE_OTHER): Payer: Medicaid Other | Admitting: Ophthalmology

## 2017-04-25 ENCOUNTER — Other Ambulatory Visit: Payer: Self-pay | Admitting: Otolaryngology

## 2017-04-26 ENCOUNTER — Other Ambulatory Visit: Payer: Self-pay

## 2017-04-26 ENCOUNTER — Emergency Department (HOSPITAL_COMMUNITY): Payer: Medicaid Other

## 2017-04-26 ENCOUNTER — Emergency Department (HOSPITAL_COMMUNITY)
Admission: EM | Admit: 2017-04-26 | Discharge: 2017-04-26 | Disposition: A | Discharge status: Home / Self Care | Payer: Medicaid Other | Attending: Emergency Medicine | Admitting: Emergency Medicine

## 2017-04-26 ENCOUNTER — Encounter (HOSPITAL_COMMUNITY): Payer: Self-pay | Admitting: Emergency Medicine

## 2017-04-26 ENCOUNTER — Ambulatory Visit: Payer: Self-pay | Admitting: Hematology and Oncology

## 2017-04-26 DIAGNOSIS — Z85818 Personal history of malignant neoplasm of other sites of lip, oral cavity, and pharynx: Secondary | ICD-10-CM | POA: Diagnosis not present

## 2017-04-26 DIAGNOSIS — F17210 Nicotine dependence, cigarettes, uncomplicated: Secondary | ICD-10-CM | POA: Insufficient documentation

## 2017-04-26 DIAGNOSIS — Z79899 Other long term (current) drug therapy: Secondary | ICD-10-CM | POA: Insufficient documentation

## 2017-04-26 DIAGNOSIS — J449 Chronic obstructive pulmonary disease, unspecified: Secondary | ICD-10-CM | POA: Diagnosis not present

## 2017-04-26 DIAGNOSIS — N183 Chronic kidney disease, stage 3 (moderate): Secondary | ICD-10-CM | POA: Insufficient documentation

## 2017-04-26 DIAGNOSIS — R51 Headache: Secondary | ICD-10-CM | POA: Diagnosis present

## 2017-04-26 DIAGNOSIS — R519 Headache, unspecified: Secondary | ICD-10-CM

## 2017-04-26 LAB — CBC WITH DIFFERENTIAL/PLATELET
BASOS PCT: 0 %
Basophils Absolute: 0 10*3/uL (ref 0.0–0.1)
EOS ABS: 0.1 10*3/uL (ref 0.0–0.7)
EOS PCT: 2 %
HCT: 35.3 % — ABNORMAL LOW (ref 39.0–52.0)
Hemoglobin: 12.2 g/dL — ABNORMAL LOW (ref 13.0–17.0)
LYMPHS ABS: 0.7 10*3/uL (ref 0.7–4.0)
Lymphocytes Relative: 17 %
MCH: 33.5 pg (ref 26.0–34.0)
MCHC: 34.6 g/dL (ref 30.0–36.0)
MCV: 97 fL (ref 78.0–100.0)
MONOS PCT: 11 %
Monocytes Absolute: 0.5 10*3/uL (ref 0.1–1.0)
Neutro Abs: 3 10*3/uL (ref 1.7–7.7)
Neutrophils Relative %: 70 %
PLATELETS: 166 10*3/uL (ref 150–400)
RBC: 3.64 MIL/uL — ABNORMAL LOW (ref 4.22–5.81)
RDW: 13.2 % (ref 11.5–15.5)
WBC: 4.3 10*3/uL (ref 4.0–10.5)

## 2017-04-26 LAB — BASIC METABOLIC PANEL
Anion gap: 9 (ref 5–15)
BUN: 15 mg/dL (ref 6–20)
CALCIUM: 9.2 mg/dL (ref 8.9–10.3)
CHLORIDE: 101 mmol/L (ref 101–111)
CO2: 24 mmol/L (ref 22–32)
CREATININE: 1.35 mg/dL — AB (ref 0.61–1.24)
GFR calc non Af Amer: 56 mL/min — ABNORMAL LOW (ref >60)
Glucose, Bld: 66 mg/dL (ref 65–99)
Potassium: 4.2 mmol/L (ref 3.5–5.1)
SODIUM: 134 mmol/L — AB (ref 135–145)

## 2017-04-26 LAB — I-STAT TROPONIN, ED: TROPONIN I, POC: 0.01 ng/mL (ref 0.00–0.08)

## 2017-04-26 MED ORDER — SODIUM CHLORIDE 0.9 % IV BOLUS (SEPSIS)
1000.0000 mL | Freq: Once | INTRAVENOUS | Status: AC
  Administered 2017-04-26: 1000 mL via INTRAVENOUS

## 2017-04-26 MED ORDER — ALBUTEROL SULFATE (2.5 MG/3ML) 0.083% IN NEBU: 5.0000 mg | INHALATION_SOLUTION | Freq: Once | RESPIRATORY_TRACT | Status: DC

## 2017-04-26 MED ORDER — METOCLOPRAMIDE HCL 5 MG/ML IJ SOLN
10.0000 mg | Freq: Once | INTRAMUSCULAR | Status: AC
  Administered 2017-04-26: 10 mg via INTRAVENOUS
  Filled 2017-04-26: qty 2

## 2017-04-26 MED ORDER — DIPHENHYDRAMINE HCL 50 MG/ML IJ SOLN
25.0000 mg | Freq: Once | INTRAMUSCULAR | Status: AC
  Administered 2017-04-26: 25 mg via INTRAVENOUS
  Filled 2017-04-26: qty 1

## 2017-04-26 NOTE — ED Provider Notes (Signed)
[facility] DEPT Provider Note   CSN: [id number] Arrival date & time: 04/26/17  0137     History   Chief Complaint Chief Complaint  Patient presents with  . Shortness of Breath  . Headache    HPI [name] [name] is a 61 y.o. male.  HPI  61 year old male with a history of tonsillar cancer with prior radiation, CKD, COPD, hepatitis presents with 24 hours of right sided headache. Started off mild and gradual and has progressively worsened. No fevers. Pain radiates down right neck and into right arm. Has chronic sinus problems but this headache feels different. Vomited once. No weakness/numbness. No neck stiffness. On the way here developed transient dyspnea, no cough or chest pain. Resolved on its own. Has taken aspirin/BC powders but no other medicine. Pain is 9/10. No eye pain or blurry vision. The headache is described as a "numbness".  Past Medical History:  Diagnosis Date  . Anxiety   . Cancer ([place]) 04/04/2016   cancer of left tonsil  . CKD (chronic kidney disease), stage III   . COPD (chronic obstructive pulmonary disease) ([place])   . Hepatitis   . Hepatitis C    Treatment x 8 weeks 6-7 months ago Harvoni  . History of radiation therapy 05/23/16- 07/10/16   Left Tonsil and bilateral neck  . Sinus congestion   . Stomach ulcer     Patient Active Problem List   Diagnosis Date Noted  . Multiple lung nodules on CT 12/05/2016  . 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 Multifocal pneumonia 10/04/2016  . Chronic kidney disease, stage III (moderate) 09/14/2016  . Dry mouth 09/14/2016  . Dysgeusia 09/05/2016  . Dehydration 09/01/2016  . Bronchitis 09/01/2016  . Diarrhea 09/01/2016  . Anemia due to chronic illness 08/24/2016  . Acute prerenal failure ([place]) 07/24/2016  . Hemoptysis 07/24/2016  . Pancytopenia, acquired ([place]) 07/04/2016  . Radiation dermatitis 07/04/2016  . Protein-calorie malnutrition, moderate ([place])  06/27/2016  . Atypical chest pain 06/27/2016  . Bilateral tinnitus 05/30/2016  . Chemotherapy-induced nausea 05/30/2016  . Constipation, acute 05/30/2016  . Unintentional weight loss 05/30/2016  . Hypersensitivity reaction 05/23/2016  . S/P gastrostomy ([place]) 05/18/2016  . Cancer associated pain 05/18/2016  . Lesion of right lung 04/26/2016  . 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[place]) 04/24/2016  . History of hepatitis C 04/24/2016  . Chest pain 09/17/2014  . Anxiety   . Dyspnea 03/25/2014  . COPD GOLD II 07/25/2013  . Sinusitis, chronic 07/25/2013  . Smoker 07/25/2013    Past Surgical History:  Procedure Laterality Date  . COLONOSCOPY WITH PROPOFOL N/A 04/14/2015   Procedure: COLONOSCOPY WITH PROPOFOL;  Surgeon: Arta Silence, MD;  Location: [facility];  Service: Endoscopy;  Laterality: N/A;  . HERNIA REPAIR    . IR GENERIC HISTORICAL  05/17/2016   IR FLUORO GUIDE PORT INSERTION RIGHT 05/17/2016 Jacqulynn Cadet, MD [facility]  . IR GENERIC HISTORICAL  05/17/2016   IR US GUIDE VASC ACCESS RIGHT 05/17/2016 Jacqulynn Cadet, MD [facility]  . IR GENERIC HISTORICAL  05/17/2016   IR GASTROSTOMY TUBE MOD SED 05/17/2016 Jacqulynn Cadet, MD [facility]  . IR GENERIC HISTORICAL  11/13/2016   IR GASTROSTOMY TUBE REMOVAL 11/13/2016 Greggory Keen, MD [facility]  . IR GENERIC HISTORICAL  11/13/2016   IR REMOVAL TUN ACCESS W/ PORT W/O FL MOD SED 11/13/2016 Greggory Keen, MD [facility]  . MULTIPLE EXTRACTIONS WITH ALVEOLOPLASTY N/A 05/03/2016   Procedure:  Multiple extractions with alveoloplasty and gross debridement of remaining teeth.;  Surgeon: Lenn Cal, DDS;  Location: [facility] OR[facility];  Service: Oral Surgery;  Laterality: N/A;  . STOMACH SURGERY     20 years       Home Medications    Prior to Admission medications   Medication Sig Start Date End Date Taking? Authorizing Provider  Travoprost, BAK Free, (TRAVATAN Z) 0.004 % SOLN ophthalmic solution Place 1 drop into both  eyes at bedtime. 04/11/17  Yes [provider]  amoxicillin-clavulanate (AUGMENTIN) 875-125 MG tablet Take 1 tablet by mouth every 12 (twelve) hours. Patient not taking: Reported on 03/27/2017 03/12/17   Valda Lamb, MD  gabapentin (NEURONTIN) 300 MG capsule Take 1 capsule (300 mg total) by mouth 3 (three) times daily. Patient not taking: Reported on 03/12/2017 01/25/17   Heath Lark, MD    Family History Family History  Problem Relation Age of Onset  . Hypertension Mother   . Anxiety disorder Mother   . Diabetes Mother   . Cancer Father     Social History Social History  Substance Use Topics  . Smoking status: Current Some Day Smoker    Packs/day: 0.50    Years: 30.00    Types: Cigarettes  . Smokeless tobacco: Never Used  . Alcohol use No     Comment: quit about 3 months ago, drank beer (he tells me a lot of beer)     Allergies   Patient has no known allergies.   Review of Systems Review of Systems  Constitutional: Negative for fever.  Eyes: Negative for pain and visual disturbance.  Respiratory: Positive for shortness of breath.   Cardiovascular: Negative for chest pain.  Gastrointestinal: Positive for vomiting. Negative for abdominal pain.  Musculoskeletal: Positive for neck pain. Negative for neck stiffness.  Neurological: Positive for dizziness and headaches. Negative for weakness and numbness.  All other systems reviewed and are negative.    Physical Exam Updated Vital Signs BP 123/84 (BP Location: Left Arm)   Pulse 65   Temp 98.5 F (36.9 C) (Oral)   Resp 14   Ht 6\' 1"  (1.854 m)   Wt 59 kg (130 lb)   SpO2 100%   BMI 17.15 kg/m   Physical Exam  Constitutional: He is oriented to person, place, and time. He appears well-developed and well-nourished.  HENT:  Head: Normocephalic and atraumatic.  Right Ear: External ear normal.  Left Ear: External ear normal.  Nose: Nose normal.  No scalp tenderness  Eyes: Pupils are equal, round, and reactive  to light. EOM are normal. Right eye exhibits no discharge. Left eye exhibits no discharge.  Neck: Normal range of motion. Neck supple. Muscular tenderness present. No spinous process tenderness present.    Cardiovascular: Normal rate, regular rhythm and normal heart sounds.   Pulmonary/Chest: Effort normal and breath sounds normal. He has no wheezes. He has no rales.  Abdominal: Soft. There is no tenderness.  Musculoskeletal: He exhibits no edema.  Neurological: He is alert and oriented to person, place, and time.  CN 3-12 grossly intact. 5/5 strength in all 4 extremities. Grossly normal sensation. Normal finger to nose.   Skin: Skin is warm and dry. He is not diaphoretic.  Nursing note and vitals reviewed.    ED Treatments / Results  Labs (all labs ordered are listed, but only abnormal results are displayed) Labs Reviewed  BASIC METABOLIC PANEL - Abnormal; Notable for the following:       Result Value  Sodium 134 (*)    Creatinine, Ser 1.35 (*)    GFR calc non Af Amer 56 (*)    All other components within normal limits  CBC WITH DIFFERENTIAL/PLATELET - Abnormal; Notable for the following:    RBC 3.64 (*)    Hemoglobin 12.2 (*)    HCT 35.3 (*)    All other components within normal limits  I-STAT TROPONIN, ED    EKG  EKG Interpretation  Date/Time:  Thursday April 26 2017 01:51:24 EDT Ventricular Rate:  76 PR Interval:  138 QRS Duration: 94 QT Interval:  374 QTC Calculation: 420 R Axis:   100 Text Interpretation:  Normal sinus rhythm Rightward axis Incomplete right bundle branch block Borderline ECG no significant change comapred to Apr 10 2017 Confirmed by Sherwood Gambler [phone]) on 04/26/2017 4:34:57 AM Also confirmed by Sherwood Gambler [phone]), editor Hattie Perch [phone])  on 04/26/2017 6:46:20 AM       Radiology Dg Chest 2 View  Result Date: 04/26/2017 CLINICAL DATA:  Dyspnea and chest pain EXAM: CHEST  2 VIEW COMPARISON:  04/17/2017, 09/16/2016 FINDINGS:  The heart size and mediastinal contours are within normal limits. Hyperinflated appearance of the lungs with scarring at the right lung apex and left lung base. Findings appear chronic. The visualized skeletal structures are unremarkable. IMPRESSION: Chronic hyperinflation of the lungs with right upper and left lower lobe scarring. Electronically Signed   By: Ashley Royalty M.D.   On: 04/26/2017 02:26   Ct Head Wo Contrast  Result Date: 04/26/2017 CLINICAL DATA:  Right side headache, neck pain and right shoulder pain. No known injury. EXAM: CT HEAD WITHOUT CONTRAST CT CERVICAL SPINE WITHOUT CONTRAST TECHNIQUE: Multidetector CT imaging of the head and cervical spine was performed following the standard protocol without intravenous contrast. Multiplanar CT image reconstructions of the cervical spine were also generated. COMPARISON:  MRI brain 04/04/2016.  Neck CT 01/23/2017. FINDINGS: CT HEAD FINDINGS Brain: No acute intracranial abnormality. Specifically, no hemorrhage, hydrocephalus, mass lesion, acute infarction, or significant intracranial injury. Vascular: No hyperdense vessel or unexpected calcification. Skull: No acute calvarial abnormality. Sinuses/Orbits: Visualized paranasal sinuses and mastoids clear. Orbital soft tissues unremarkable. Other: None CT CERVICAL SPINE FINDINGS Alignment: Normal Skull base and vertebrae: No fracture Soft tissues and spinal canal: Prevertebral soft tissues are normal. No epidural or paraspinal hematoma. Disc levels: Diffuse degenerative disc disease. Mild degenerative facet disease bilaterally. Upper chest: Biapical pleural/ parenchymal scarring, stable since prior CT Other: No acute findings. Skin thickening within the neck compatible with prior radiation. IMPRESSION: No acute intracranial abnormality. No acute bony abnormality in the cervical spine.  Spondylosis. Electronically Signed   By: Rolm Baptise M.D.   On: 04/26/2017 07:12   Ct Cervical Spine Wo Contrast  Result  Date: 04/26/2017 CLINICAL DATA:  Right side headache, neck pain and right shoulder pain. No known injury. EXAM: CT HEAD WITHOUT CONTRAST CT CERVICAL SPINE WITHOUT CONTRAST TECHNIQUE: Multidetector CT imaging of the head and cervical spine was performed following the standard protocol without intravenous contrast. Multiplanar CT image reconstructions of the cervical spine were also generated. COMPARISON:  MRI brain 04/04/2016.  Neck CT 01/23/2017. FINDINGS: CT HEAD FINDINGS Brain: No acute intracranial abnormality. Specifically, no hemorrhage, hydrocephalus, mass lesion, acute infarction, or significant intracranial injury. Vascular: No hyperdense vessel or unexpected calcification. Skull: No acute calvarial abnormality. Sinuses/Orbits: Visualized paranasal sinuses and mastoids clear. Orbital soft tissues unremarkable. Other: None CT CERVICAL SPINE FINDINGS Alignment: Normal Skull base and vertebrae: No fracture Soft tissues and spinal  canal: Prevertebral soft tissues are normal. No epidural or paraspinal hematoma. Disc levels: Diffuse degenerative disc disease. Mild degenerative facet disease bilaterally. Upper chest: Biapical pleural/ parenchymal scarring, stable since prior CT Other: No acute findings. Skin thickening within the neck compatible with prior radiation. IMPRESSION: No acute intracranial abnormality. No acute bony abnormality in the cervical spine.  Spondylosis. Electronically Signed   By: Rolm Baptise M.D.   On: 04/26/2017 07:12    Procedures Procedures (including critical care time)  Medications Ordered in ED Medications  albuterol (PROVENTIL) (2.5 MG/3ML) 0.083% nebulizer solution 5 mg (5 mg Nebulization Not Given 04/26/17 0331)  sodium chloride 0.9 % bolus 1,000 mL (0 mLs Intravenous Stopped 04/26/17 0740)  metoCLOPramide (REGLAN) injection 10 mg (10 mg Intravenous Given 04/26/17 0533)  diphenhydrAMINE (BENADRYL) injection 25 mg (25 mg Intravenous Given 04/26/17 0533)     Initial  Impression / Assessment and Plan / ED Course  I have reviewed the triage vital signs and the nursing notes.  Pertinent labs & imaging results that were available during my care of the patient were reviewed by me and considered in my medical decision making (see chart for details).     HA resolved after reglan/benadryl/fluids. Unclear cause. Gradual in onset, doubt SAH. Given cancer history, CT obtained. This is benign. Might be primarily from neck given pain in right lateral neck and radiation down arm. No neuro symptoms to suggest dissection. Full ROM. Doubt infectious cause. Unclear cause of very transient dyspnea. Well appearing here. D/c home with tylenol/nsaids, f/u with PCP. Discussed return precautions.   Final Clinical Impressions(s) / ED Diagnoses   Final diagnoses:  Right-sided headache    New Prescriptions Discharge Medication List as of 04/26/2017  7:32 AM       Sherwood Gambler, MD 04/26/17 [phone]

## 2017-04-26 NOTE — ED Triage Notes (Signed)
Pt c/o shortness of breath and a headache that began yesterday. Pt denies chest pain. Lung sounds clear.

## 2017-05-05 ENCOUNTER — Ambulatory Visit (HOSPITAL_COMMUNITY)
Admission: EM | Admit: 2017-05-05 | Discharge: 2017-05-05 | Disposition: A | Discharge status: Home / Self Care | Payer: Self-pay

## 2017-05-05 ENCOUNTER — Ambulatory Visit (INDEPENDENT_AMBULATORY_CARE_PROVIDER_SITE_OTHER): Payer: Self-pay

## 2017-05-05 ENCOUNTER — Encounter (HOSPITAL_COMMUNITY): Payer: Self-pay | Admitting: *Deleted

## 2017-05-05 DIAGNOSIS — M542 Cervicalgia: Secondary | ICD-10-CM

## 2017-05-05 DIAGNOSIS — M791 Myalgia: Secondary | ICD-10-CM

## 2017-05-05 DIAGNOSIS — M7918 Myalgia, other site: Secondary | ICD-10-CM

## 2017-05-05 DIAGNOSIS — S41011A Laceration without foreign body of right shoulder, initial encounter: Secondary | ICD-10-CM

## 2017-05-05 MED ORDER — KETOROLAC TROMETHAMINE 30 MG/ML IJ SOLN
15.0000 mg | Freq: Once | INTRAMUSCULAR | Status: AC
  Administered 2017-05-05: 15 mg via INTRAMUSCULAR

## 2017-05-05 MED ORDER — KETOROLAC TROMETHAMINE 30 MG/ML IJ SOLN
INTRAMUSCULAR | Status: AC
  Filled 2017-05-05: qty 1

## 2017-05-05 MED ORDER — METHOCARBAMOL 500 MG PO TABS: 500.0000 mg | ORAL_TABLET | Freq: Two times a day (BID) | ORAL | 0 refills | Status: DC

## 2017-05-05 NOTE — Discharge Instructions (Signed)
No fracture seen dislocation seen on x-ray, he did have evidence of degenerative changes to your cervical spine. If you have continued neck pain, follow-up with the orthopedist or neurologist. Given injection here for pain, no started you on a muscle relaxer called Robaxin. This may cause drowsiness, do not drive or operate heavy machinery until you know how this affects you.
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
## 2017-06-15 ENCOUNTER — Encounter (HOSPITAL_COMMUNITY): Payer: Self-pay | Admitting: Emergency Medicine

## 2017-06-15 ENCOUNTER — Emergency Department (HOSPITAL_COMMUNITY): Payer: Medicaid Other

## 2017-06-15 ENCOUNTER — Telehealth: Payer: Self-pay | Admitting: *Deleted

## 2017-06-15 ENCOUNTER — Ambulatory Visit (HOSPITAL_COMMUNITY)
Admission: EM | Admit: 2017-06-15 | Discharge: 2017-06-15 | Disposition: A | Discharge status: Other Acute Inpatient Hospital | Payer: Medicaid Other | Attending: Family Medicine | Admitting: Family Medicine

## 2017-06-15 ENCOUNTER — Emergency Department (HOSPITAL_COMMUNITY)
Admission: EM | Admit: 2017-06-15 | Discharge: 2017-06-15 | Disposition: A | Discharge status: Home / Self Care | Payer: Medicaid Other | Attending: Emergency Medicine | Admitting: Emergency Medicine

## 2017-06-15 DIAGNOSIS — R0789 Other chest pain: Secondary | ICD-10-CM | POA: Diagnosis not present

## 2017-06-15 DIAGNOSIS — Z79899 Other long term (current) drug therapy: Secondary | ICD-10-CM | POA: Insufficient documentation

## 2017-06-15 DIAGNOSIS — Z85818 Personal history of malignant neoplasm of other sites of lip, oral cavity, and pharynx: Secondary | ICD-10-CM | POA: Insufficient documentation

## 2017-06-15 DIAGNOSIS — N183 Chronic kidney disease, stage 3 (moderate): Secondary | ICD-10-CM | POA: Diagnosis not present

## 2017-06-15 DIAGNOSIS — R079 Chest pain, unspecified: Secondary | ICD-10-CM

## 2017-06-15 DIAGNOSIS — F17210 Nicotine dependence, cigarettes, uncomplicated: Secondary | ICD-10-CM | POA: Insufficient documentation

## 2017-06-15 DIAGNOSIS — J449 Chronic obstructive pulmonary disease, unspecified: Secondary | ICD-10-CM | POA: Diagnosis not present

## 2017-06-15 LAB — BASIC METABOLIC PANEL
ANION GAP: 12 (ref 5–15)
BUN: 19 mg/dL (ref 6–20)
CHLORIDE: 103 mmol/L (ref 101–111)
CO2: 24 mmol/L (ref 22–32)
Calcium: 9.6 mg/dL (ref 8.9–10.3)
Creatinine, Ser: 1.51 mg/dL — ABNORMAL HIGH (ref 0.61–1.24)
GFR, EST AFRICAN AMERICAN: 57 mL/min — AB (ref >60)
GFR, EST NON AFRICAN AMERICAN: 49 mL/min — AB (ref >60)
Glucose, Bld: 63 mg/dL — ABNORMAL LOW (ref 65–99)
POTASSIUM: 3.8 mmol/L (ref 3.5–5.1)
SODIUM: 139 mmol/L (ref 135–145)

## 2017-06-15 LAB — CBC
HCT: 37 % — ABNORMAL LOW (ref 39.0–52.0)
HEMOGLOBIN: 12.3 g/dL — AB (ref 13.0–17.0)
MCH: 33.5 pg (ref 26.0–34.0)
MCHC: 33.2 g/dL (ref 30.0–36.0)
MCV: 100.8 fL — ABNORMAL HIGH (ref 78.0–100.0)
PLATELETS: 145 10*3/uL — AB (ref 150–400)
RBC: 3.67 MIL/uL — AB (ref 4.22–5.81)
RDW: 14.4 % (ref 11.5–15.5)
WBC: 5.8 10*3/uL (ref 4.0–10.5)

## 2017-06-15 LAB — TROPONIN I

## 2017-06-15 MED ORDER — NITROGLYCERIN 0.4 MG SL SUBL
SUBLINGUAL_TABLET | SUBLINGUAL | Status: AC
  Filled 2017-06-15: qty 1

## 2017-06-15 MED ORDER — ASPIRIN 81 MG PO CHEW
CHEWABLE_TABLET | ORAL | Status: AC
  Filled 2017-06-15: qty 4

## 2017-06-15 MED ORDER — NITROGLYCERIN 0.4 MG SL SUBL
0.4000 mg | SUBLINGUAL_TABLET | SUBLINGUAL | Status: DC | PRN
  Administered 2017-06-15: 0.4 mg via SUBLINGUAL

## 2017-06-15 MED ORDER — ASPIRIN 81 MG PO CHEW
324.0000 mg | CHEWABLE_TABLET | Freq: Once | ORAL | Status: AC
  Administered 2017-06-15: 324 mg via ORAL

## 2017-06-15 NOTE — ED Notes (Signed)
[facility] has been dispatched... Report given to Darci Current, RN at [facility] ED.

## 2017-06-15 NOTE — ED Notes (Signed)
Collected blood samples from patient and patient to Xray at this time. Per MD [facility], patient okay to go to waiting area and await test results.

## 2017-06-15 NOTE — ED Triage Notes (Signed)
Pt to ED via [facility] from Urgent Care with c/o left chest pain.  Onset last pm but became worse today.  Pt denies any shortness of breath, nausea or vomiting.   Pt was given ASA 324  And NTG 1 SL

## 2017-06-15 NOTE — Telephone Encounter (Signed)
Oncology Nurse Navigator Documentation  Rec'd call from patient asking for confirmation of future appts.  Informed him of 11/26 0745 lab, 11/27 11:00 with Dr. Alvy Bimler.  He voiced understanding.  Gayleen Orem, RN, BSN, [place] [facility] Oncology Nurse [place] [facility] [phone]

## 2017-06-15 NOTE — ED Provider Notes (Signed)
[facility] DEPT Provider Note   CSN: [id number] Arrival date & time: 06/15/17  1843     History   Chief Complaint Chest discomfort  HPI [name] [name] is a 61 y.o. male.  HPI Patient is a 61 year old male presents the emergency department with intermittent chest pain which now been constant today and is located in his left chest.  He states this feels like pressure.  He is seen in the urgent care and transferred to the emergency department for further evaluation.  He smokes cigarettes and has a history of chronic kidney disease and COPD.  No prior history of known coronary disease.  Denies a history of hypertension or hyperlipidemia.  No diabetes.  No family history of cardiac disease.states no significant discomfort at this time.   Past Medical History:  Diagnosis Date  . Anxiety   . Cancer ([place]) 04/04/2016   cancer of left tonsil  . CKD (chronic kidney disease), stage III ([place])   . COPD (chronic obstructive pulmonary disease) ([place])   . Hepatitis   . Hepatitis C    Treatment x 8 weeks 6-7 months ago Harvoni  . History of radiation therapy 05/23/16- 07/10/16   Left Tonsil and bilateral neck  . Sinus congestion   . Stomach ulcer     Patient Active Problem List   Diagnosis Date Noted  . Multiple lung nodules on CT 12/05/2016  . 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 Multifocal pneumonia 10/04/2016  . Chronic kidney disease, stage III (moderate) ([place]) 09/14/2016  . Dry mouth 09/14/2016  . Dysgeusia 09/05/2016  . Dehydration 09/01/2016  . Bronchitis 09/01/2016  . Diarrhea 09/01/2016  . Anemia due to chronic illness 08/24/2016  . Acute prerenal failure ([place]) 07/24/2016  . Hemoptysis 07/24/2016  . Pancytopenia, acquired ([place]) 07/04/2016  . Radiation dermatitis 07/04/2016  . Protein-calorie malnutrition, moderate ([place]) 06/27/2016  . Atypical chest pain 06/27/2016  . Bilateral tinnitus 05/30/2016  .  Chemotherapy-induced nausea 05/30/2016  . Constipation, acute 05/30/2016  . Unintentional weight loss 05/30/2016  . Hypersensitivity reaction 05/23/2016  . S/P gastrostomy ([place]) 05/18/2016  . Cancer associated pain 05/18/2016  . Lesion of right lung 04/26/2016  . 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 History of hepatitis C 04/24/2016  . Chest pain 09/17/2014  . Anxiety   . Dyspnea 03/25/2014  . COPD GOLD II 07/25/2013  . Sinusitis, chronic 07/25/2013  . Smoker 07/25/2013    Past Surgical History:  Procedure Laterality Date  . COLONOSCOPY WITH PROPOFOL N/A 04/14/2015   Procedure: COLONOSCOPY WITH PROPOFOL;  Surgeon: Arta Silence, MD;  Location: [facility];  Service: Endoscopy;  Laterality: N/A;  . HERNIA REPAIR    . IR GENERIC HISTORICAL  05/17/2016   IR FLUORO GUIDE PORT INSERTION RIGHT 05/17/2016 Jacqulynn Cadet, MD [facility]  . IR GENERIC HISTORICAL  05/17/2016   IR US GUIDE VASC ACCESS RIGHT 05/17/2016 Jacqulynn Cadet, MD [facility]  . IR GENERIC HISTORICAL  05/17/2016   IR GASTROSTOMY TUBE MOD SED 05/17/2016 Jacqulynn Cadet, MD [facility]  . IR GENERIC HISTORICAL  11/13/2016   IR GASTROSTOMY TUBE REMOVAL 11/13/2016 Greggory Keen, MD [facility]  . IR GENERIC HISTORICAL  11/13/2016   IR REMOVAL TUN ACCESS W/ PORT W/O FL MOD SED 11/13/2016 Greggory Keen, MD [facility]  . MULTIPLE EXTRACTIONS WITH ALVEOLOPLASTY N/A 05/03/2016   Procedure: Multiple extractions with alveoloplasty and gross debridement of remaining teeth.;  Surgeon: Lenn Cal, DDS;  Location: [facility];  Service: Oral Surgery;  Laterality: N/A;  . STOMACH SURGERY     20 years       Home Medications    Prior to Admission medications   Medication Sig Start Date End Date Taking? Authorizing Provider  amoxicillin-clavulanate (AUGMENTIN) 875-125 MG tablet Take 1 tablet by mouth every 12 (twelve) hours. Patient not taking: Reported on 03/27/2017 03/12/17   Valda Lamb, MD    Budesonide-Formoterol Fumarate (SYMBICORT IN) Inhale into the lungs.    [provider]  gabapentin (NEURONTIN) 300 MG capsule Take 1 capsule (300 mg total) by mouth 3 (three) times daily. Patient not taking: Reported on 03/12/2017 01/25/17   Heath Lark, MD  methocarbamol (ROBAXIN) 500 MG tablet Take 1 tablet (500 mg total) by mouth 2 (two) times daily. 05/05/17   Barnet Glasgow, NP  Travoprost, BAK Free, (TRAVATAN Z) 0.004 % SOLN ophthalmic solution Place 1 drop into both eyes at bedtime. 04/11/17   [provider]  UNKNOWN TO PATIENT Rx med for sinus    [provider]    Family History Family History  Problem Relation Age of Onset  . Hypertension Mother   . Anxiety disorder Mother   . Diabetes Mother   . Cancer Father     Social History Social History  Substance Use Topics  . Smoking status: Current Some Day Smoker    Packs/day: 0.50    Years: 30.00    Types: Cigarettes  . Smokeless tobacco: Never Used  . Alcohol use Yes     Comment: QOD     Allergies   Patient has no known allergies.   Review of Systems Review of Systems  All other systems reviewed and are negative.    Physical Exam Updated Vital Signs There were no vitals taken for this visit.  Physical Exam  Constitutional: He is oriented to person, place, and time. He appears well-developed and well-nourished.  HENT:  Head: Normocephalic and atraumatic.  Eyes: EOM are normal.  Neck: Normal range of motion.  Cardiovascular: Normal rate, regular rhythm, normal heart sounds and intact distal pulses.   Pulmonary/Chest: Effort normal and breath sounds normal. No respiratory distress.  Abdominal: Soft. He exhibits no distension. There is no tenderness.  Musculoskeletal: Normal range of motion.  Neurological: He is alert and oriented to person, place, and time.  Skin: Skin is warm and dry.  Psychiatric: He has a normal mood and affect. Judgment normal.  Nursing note and vitals  reviewed.    ED Treatments / Results  Labs (all labs ordered are listed, but only abnormal results are displayed) Labs Reviewed  CBC - Abnormal; Notable for the following:       Result Value   RBC 3.67 (*)    Hemoglobin 12.3 (*)    HCT 37.0 (*)    MCV 100.8 (*)    Platelets 145 (*)    All other components within normal limits  BASIC METABOLIC PANEL - Abnormal; Notable for the following:    Glucose, Bld 63 (*)    Creatinine, Ser 1.51 (*)    GFR calc non Af Amer 49 (*)    GFR calc Af Amer 57 (*)    All other components within normal limits  TROPONIN I    EKG  EKG Interpretation  Date/Time:  Friday June 15 2017 19:06:37 EDT Ventricular Rate:  50 PR Interval:    QRS Duration: 103 QT Interval:  435 QTC Calculation: 397 R Axis:   95 Text Interpretation:  Sinus rhythm Left atrial enlargement Borderline right axis deviation RSR' in V1 or V2, probably normal variant No significant change was found Confirmed by Jola Schmidt [phone]) on 06/15/2017 7:12:46 PM       Radiology Dg Chest 2 View  Result Date: 06/15/2017 CLINICAL DATA:  Left-sided chest pain. EXAM: CHEST  2 VIEW COMPARISON:  04/26/2017 FINDINGS: Cardiomediastinal silhouette is normal. Mediastinal contours appear intact. There is no evidence of pleural effusion or pneumothorax. There is an airspace consolidation versus pulmonary nodule with increasing distortion of the interstitial markings in the right apex of the lung. Osseous structures are without acute abnormality. Soft tissues are grossly normal. IMPRESSION: Airspace consolidations versus pulmonary nodule with increasing distortion of the interstitial markings in the right apex of the lung. Further evaluation with CT of the chest without contrast may be considered, if found clinically necessary. Electronically Signed   By: Fidela Salisbury M.D.   On: 06/15/2017 20:22    Procedures Procedures (including critical care time)  Medications Ordered in  ED Medications - No data to display   Initial Impression / Assessment and Plan / ED Course  I have reviewed the triage vital signs and the nursing notes.  Pertinent labs & imaging results that were available during my care of the patient were reviewed by me and considered in my medical decision making (see chart for details).     Patient seen and evaluated.  Labs chest x-ray EKG.  Nonspecific chest x-ray.  Patient does have foot appears to be a pulmonary nodule in his right upper lobe.  This is not consistent with his presentation today.  He understands that he will need follow-up imaging including a CT scan for this.  Clinically I do not think the patient has pneumonia.  He understands to return to the ER for new or worsening symptoms.  I recommended smoking cessation.  Final Clinical Impressions(s) / ED Diagnoses   Final diagnoses:  Chest pain, unspecified type    New Prescriptions Discharge Medication List as of 06/15/2017 11:07 PM       Jola Schmidt, MD 06/16/17 0100

## 2017-06-15 NOTE — ED Triage Notes (Signed)
Pt sts left sided CP around rib area x weeks worse over last several days

## 2017-06-20 NOTE — ED Provider Notes (Signed)
[facility]   [id number] 06/15/17 Arrival Time: 6644  ASSESSMENT & PLAN:  1. Nonspecific chest pain     Meds ordered this encounter  Medications  . DISCONTD: nitroGLYCERIN (NITROSTAT) SL tablet 0.4 mg  . aspirin chewable tablet 324 mg   Very mild improvement with SL NTG. Isolated v2 inverted T wave on ECG. EMS to ED for evaluation. Stable upon D/C.  Reviewed expectations re: course of current medical issues. Questions answered. Outlined signs and symptoms indicating need for more acute intervention. Patient verbalized understanding. After Visit Summary given.   SUBJECTIVE:  [name] [name] is a 61 y.o. male who presents with complaint of:  Chest Pain: Onset abrupt, 1 day ago, with unchanged course since that time. Describes discomfort as intermittent, occurring with increasing frequency, pressure like in nature, does not radiate. Patient rates pain as a 7/10 in intensity. Associated symptoms: none. Aggravating factors: none. Alleviating factors: none. Cardiac risk factors: advanced age (older than 12 for men, 32 for women). Patient's risk factors for DVT/PE: none. Previous cardiac testing: none reported. OTC treatment: None. Current smoker.  ROS: As per HPI.   OBJECTIVE:  Vitals:   06/15/17 1741  BP: 137/87  Pulse: 61  Resp: 18  Temp: 98.1 F (36.7 C)  TempSrc: Oral  SpO2: 100%    General appearance: alert; no distress Eyes: PERRLA; EOMI; conjunctiva normal HENT: normocephalic; atraumatic Neck: supple Lungs: clear to auscultation bilaterally Heart: regular rate and rhythm Chest Wall: non-tender Abdomen: soft, non-tender; bowel sounds normal; no masses or organomegaly; no guarding or rebound tenderness Extremities: no cyanosis or edema; symmetrical with no gross deformities Skin: warm and dry Psychological: alert and cooperative; normal mood and affect   No Known Allergies  Past Medical History:  Diagnosis Date  . Anxiety   . Cancer ([place])  04/04/2016   cancer of left tonsil  . CKD (chronic kidney disease), stage III ([place])   . COPD (chronic obstructive pulmonary disease) ([place])   . Hepatitis   . Hepatitis C    Treatment x 8 weeks 6-7 months ago Harvoni  . History of radiation therapy 05/23/16- 07/10/16   Left Tonsil and bilateral neck  . Sinus congestion   . Stomach ulcer    Social History   Social History  . Marital status: Single    Spouse name: N/A  . Number of children: 3  . Years of education: N/A   Occupational History  . disabled    Social History Main Topics  . Smoking status: Current Some Day Smoker    Packs/day: 0.50    Years: 30.00    Types: Cigarettes  . Smokeless tobacco: Never Used  . Alcohol use Yes     Comment: QOD  . Drug use: No  . Sexual activity: Yes   Other Topics Concern  . Not on file   Social History Narrative   Lives with mother.    Mother is disabled with mental illness. He is the principal care giver   Has 3 children.   [name] is an important contact   He does not drive. Takes a bus. Ride a bike.   Family History  Problem Relation Age of Onset  . Hypertension Mother   . Anxiety disorder Mother   . Diabetes Mother   . Cancer Father    Past Surgical History:  Procedure Laterality Date  . COLONOSCOPY WITH PROPOFOL N/A 04/14/2015   Procedure: COLONOSCOPY WITH PROPOFOL;  Surgeon: Arta Silence, MD;  Location[facility];  Service: Endoscopy;  Laterality: N/A;  . HERNIA REPAIR    . IR GENERIC HISTORICAL  05/17/2016   IR FLUORO GUIDE PORT INSERTION RIGHT 05/17/2016 Jacqulynn Cadet, MD [facility]  . IR GENERIC HISTORICAL  05/17/2016   IR US GUIDE VASC ACCESS RIGHT 05/17/2016 Jacqulynn Cadet, MD [facility]  . IR GENERIC HISTORICAL  05/17/2016   IR GASTROSTOMY TUBE MOD SED 05/17/2016 Jacqulynn Cadet, MD [facility]  . IR GENERIC HISTORICAL  11/13/2016   IR GASTROSTOMY TUBE REMOVAL 11/13/2016 Greggory Keen, MD [facility]  . IR GENERIC HISTORICAL  11/13/2016   IR  REMOVAL TUN ACCESS W/ PORT W/O FL MOD SED 11/13/2016 Greggory Keen, MD [facility]  . MULTIPLE EXTRACTIONS WITH ALVEOLOPLASTY N/A 05/03/2016   Procedure: Multiple extractions with alveoloplasty and gross debridement of remaining teeth.;  Surgeon: Lenn Cal, DDS;  Location: [facility];  Service: Oral Surgery;  Laterality: N/A;  . STOMACH SURGERY     20 years     Vanessa Kick, MD 06/20/17 [phone]

## 2017-07-27 ENCOUNTER — Telehealth: Payer: Self-pay | Admitting: *Deleted

## 2017-07-27 NOTE — Telephone Encounter (Signed)
Pt states his CT scan was changed to 11/29 because it has not been authorized. We tried to get this changed back to an earlier date without success. Appts: 11/26 lab, 11/27 Dr Alvy Bimler, 11/29 CT. Pt is anxious to talk to Dr Alvy Bimler because of his chest pain, but knows he needs to have scan for her to review first.

## 2017-07-30 ENCOUNTER — Other Ambulatory Visit (HOSPITAL_BASED_OUTPATIENT_CLINIC_OR_DEPARTMENT_OTHER): Payer: Medicaid Other

## 2017-07-30 ENCOUNTER — Ambulatory Visit (HOSPITAL_COMMUNITY): Payer: Medicaid Other

## 2017-07-30 DIAGNOSIS — C090 Malignant neoplasm of tonsillar fossa: Secondary | ICD-10-CM | POA: Diagnosis not present

## 2017-07-30 LAB — CBC WITH DIFFERENTIAL/PLATELET
BASO%: 0.2 % (ref 0.0–2.0)
BASOS ABS: 0 10*3/uL (ref 0.0–0.1)
EOS%: 4.3 % (ref 0.0–7.0)
Eosinophils Absolute: 0.3 10*3/uL (ref 0.0–0.5)
HCT: 40.3 % (ref 38.4–49.9)
HGB: 13.7 g/dL (ref 13.0–17.1)
LYMPH%: 25.3 % (ref 14.0–49.0)
MCH: 33.8 pg — AB (ref 27.2–33.4)
MCHC: 34 g/dL (ref 32.0–36.0)
MCV: 99.5 fL — ABNORMAL HIGH (ref 79.3–98.0)
MONO#: 0.6 10*3/uL (ref 0.1–0.9)
MONO%: 8.9 % (ref 0.0–14.0)
NEUT#: 3.9 10*3/uL (ref 1.5–6.5)
NEUT%: 61.3 % (ref 39.0–75.0)
Platelets: 200 10*3/uL (ref 140–400)
RBC: 4.05 10*6/uL — ABNORMAL LOW (ref 4.20–5.82)
RDW: 12.4 % (ref 11.0–14.6)
WBC: 6.3 10*3/uL (ref 4.0–10.3)
lymph#: 1.6 10*3/uL (ref 0.9–3.3)

## 2017-07-30 LAB — COMPREHENSIVE METABOLIC PANEL
ALT: 16 U/L (ref 0–55)
AST: 27 U/L (ref 5–34)
Albumin: 4.3 g/dL (ref 3.5–5.0)
Alkaline Phosphatase: 64 U/L (ref 40–150)
Anion Gap: 11 mEq/L (ref 3–11)
BILIRUBIN TOTAL: 0.52 mg/dL (ref 0.20–1.20)
BUN: 21 mg/dL (ref 7.0–26.0)
CO2: 27 meq/L (ref 22–29)
CREATININE: 1.6 mg/dL — AB (ref 0.7–1.3)
Calcium: 10.1 mg/dL (ref 8.4–10.4)
Chloride: 100 mEq/L (ref 98–109)
EGFR: 52 mL/min/{1.73_m2} — ABNORMAL LOW (ref >60)
GLUCOSE: 97 mg/dL (ref 70–140)
Potassium: 4.9 mEq/L (ref 3.5–5.1)
SODIUM: 139 meq/L (ref 136–145)
TOTAL PROTEIN: 8 g/dL (ref 6.4–8.3)

## 2017-07-31 ENCOUNTER — Telehealth: Payer: Self-pay | Admitting: Hematology and Oncology

## 2017-07-31 ENCOUNTER — Ambulatory Visit: Payer: Self-pay | Admitting: Hematology and Oncology

## 2017-07-31 NOTE — Telephone Encounter (Signed)
Spoke with patient re 11/30 f/u

## 2017-08-02 ENCOUNTER — Encounter (HOSPITAL_COMMUNITY): Payer: Self-pay

## 2017-08-02 ENCOUNTER — Ambulatory Visit (HOSPITAL_COMMUNITY)
Admission: RE | Admit: 2017-08-02 | Discharge: 2017-08-02 | Disposition: A | Discharge status: Home / Self Care | Payer: Medicaid Other | Source: Ambulatory Visit | Attending: Hematology and Oncology | Admitting: Hematology and Oncology

## 2017-08-02 DIAGNOSIS — C090 Malignant neoplasm of tonsillar fossa: Secondary | ICD-10-CM | POA: Diagnosis present

## 2017-08-02 DIAGNOSIS — R911 Solitary pulmonary nodule: Secondary | ICD-10-CM | POA: Insufficient documentation

## 2017-08-02 DIAGNOSIS — Z923 Personal history of irradiation: Secondary | ICD-10-CM | POA: Insufficient documentation

## 2017-08-02 MED ORDER — IOPAMIDOL (ISOVUE-300) INJECTION 61%
75.0000 mL | Freq: Once | INTRAVENOUS | Status: AC | PRN
  Administered 2017-08-02: 75 mL via INTRAVENOUS

## 2017-08-02 MED ORDER — IOPAMIDOL (ISOVUE-300) INJECTION 61%
INTRAVENOUS | Status: AC
  Administered 2017-08-02: 75 mL via INTRAVENOUS
  Filled 2017-08-02: qty 75

## 2017-08-03 ENCOUNTER — Telehealth: Payer: Self-pay | Admitting: Hematology and Oncology

## 2017-08-03 ENCOUNTER — Encounter: Payer: Self-pay | Admitting: Hematology and Oncology

## 2017-08-03 ENCOUNTER — Ambulatory Visit (HOSPITAL_BASED_OUTPATIENT_CLINIC_OR_DEPARTMENT_OTHER): Payer: Medicaid Other | Admitting: Hematology and Oncology

## 2017-08-03 VITALS — BP 132/88 | HR 63 | Temp 98.5°F | Resp 18 | Ht 73.0 in | Wt 143.4 lb

## 2017-08-03 DIAGNOSIS — M436 Torticollis: Secondary | ICD-10-CM | POA: Diagnosis not present

## 2017-08-03 DIAGNOSIS — N183 Chronic kidney disease, stage 3 unspecified: Secondary | ICD-10-CM

## 2017-08-03 DIAGNOSIS — R911 Solitary pulmonary nodule: Secondary | ICD-10-CM | POA: Diagnosis not present

## 2017-08-03 DIAGNOSIS — R918 Other nonspecific abnormal finding of lung field: Secondary | ICD-10-CM

## 2017-08-03 DIAGNOSIS — G620 Drug-induced polyneuropathy: Secondary | ICD-10-CM | POA: Diagnosis not present

## 2017-08-03 DIAGNOSIS — C090 Malignant neoplasm of tonsillar fossa: Secondary | ICD-10-CM | POA: Diagnosis present

## 2017-08-03 DIAGNOSIS — T451X5A Adverse effect of antineoplastic and immunosuppressive drugs, initial encounter: Principal | ICD-10-CM

## 2017-08-03 NOTE — Progress Notes (Signed)
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 Chronic small-vessel ischemic changes of the cerebral hemispheric white matter.       04/04/2016 Imaging    CT neck showed left tonsillar mass with diameter of 19 x 24 mm consistent with 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 necrosis. Suspicious level 5/supraclavicular nodes on the left. Scar type density in the right upper lobe is more prominent than was seen on a CT scan of the chest 10/06/2014      04/21/2016 PET scan    Asymmetric hypermetabolic soft tissue prominence in left palatine tonsil, consistent with known primary left tonsillar squamous cell Carcinoma. Left level 2 cervical lymphadenopathy, consistent with metastatic disease. No evidence of metastatic disease within the chest, abdomen, or pelvis. Stable 14 mm ground-glass and part solid nodular opacity in right lung apex compared to previous CT in 2016. This shows low-grade metabolic activity. Low-grade bronchogenic adenocarcinoma cannot be excluded. Consider surgical resection versus continued followup by CT in 12 months      05/17/2016 Procedure    Successful placement of a 20 French  pull through gastrostomy tube & right IJ approach [facility] [place].      05/22/2016 - 07/03/2016 Chemotherapy    [name] [name] received 3 doses of high dose cisplatin with radiation       05/23/2016 - 07/10/2016 Radiation Therapy    Rec'd Helical IMRT:  Left tonsil and bilateral neck / 70 Gy in 35 fractions to gross disease, 63 Gy in 35 fractions to high risk nodal echelons, and 56 Gy in 35 fractions to intermediate risk nodal echelons.          10/04/2016 - 10/07/2016 Hospital Admission    [name] [name] was admitted due to atypical chest pain from illicit drug use. CT scan showed possible atypical pneumonia versus radiation pneumonitis      10/04/2016 Imaging    Ct scan showed no evidence of pulmonary embolus. 2. Patchy bilateral airspace opacities, at the upper lobes, compatible with multifocal pneumonia. This corresponds to the finding on recent chest radiograph. 3. Previously noted ground-glass and solid nodule at the right lung apex demonstrates increased solid components. The solid component now measures 1.2 cm. 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 Tissue diagnosis would be helpful, as deemed clinically appropriate.      10/26/2016 PET scan    Slight increasing solid component of a right apical lung lesion but no concerning hypermetabolism. Recommend continued surveillance. 2. Bilateral patchy apical airspace opacities are mild to moderately hypermetabolic and likely reflect infection. Recommend short-term followup noncontrast chest CT in 3 months to reassess. 3. No mediastinal or hilar mass or adenopathy. 4. No significant findings in the abdomen/pelvis. 5. Stable lucencies throughout  the bony pelvis but no hypermetabolism to suggest metastasis.      11/13/2016 Procedure    Successful right IJ vein Port-A-Cath explant.      11/13/2016 Procedure    Uncomplicated gastrostomy removal      01/23/2017 Imaging    Ct neck: 1. Sequelae of radiation therapy in  the neck. The oropharynx level of this neck CT is degraded by motion artifact. Still, soft tissue volume has definitely decreased at the level of the 2017 left tonsillar mass, suggesting positive response to treatment and concordant with the negative findings on the February PET-CT. 2. Normalized left level 2 metastatic lymph node. No abnormal cervical lymph nodes today. 3. Chest CT findings today are reported separately.      01/23/2017 Imaging    Ct chest: 1. Stable biapical pleural and parenchymal scarring changes with patchy airspace opacities and interstitial thickening most likely representing postinflammatory/postinfectious scarring changes. No new pulmonary lesions to suggest metastatic disease. Recommend six-month follow-up noncontrast chest CT to reassess. 2. Stable emphysematous changes. 3. No mediastinal or hilar mass or adenopathy.      08/02/2017 Imaging    1. Irregular subsolid apical right upper lobe 1.8 cm pulmonary nodule is stable in size with slightly increased solid component in the interval. This nodule has increased in density and mildly increased in size since 10/06/2014 chest CT, which pre-dates the history of radiation therapy for the left tonsillar malignancy, which was diagnosed in August 2017. As such, an indolent/low grade primary bronchogenic adenocarcinoma remains on the differential. Continued surveillance is advised if surgical management or biopsy is not elected at this time. 2. Separate biapical lung opacities demonstrate changes suggestive of evolving postinflammatory change such as due to prior radiation therapy. 3. No thoracic adenopathy or other findings suggestive of metastatic disease in the chest.       INTERVAL HISTORY: Please see below for problem oriented charting. [name] [name] returns for further follow-up The patient complained of intermittent cough and chest discomfort Denies fever or chills No hemoptysis [name] [name] denies swallowing difficulties [name] [name] denies recent  smoking or drug use  REVIEW OF SYSTEMS:   Constitutional: Denies fevers, chills or abnormal weight loss Eyes: Denies blurriness of vision Ears, nose, mouth, throat, and face: Denies mucositis or sore throat Cardiovascular: Denies palpitation, chest discomfort or lower extremity swelling Gastrointestinal:  Denies nausea, heartburn or change in bowel habits Skin: Denies abnormal skin rashes Lymphatics: Denies new lymphadenopathy or easy bruising Neurological:Denies numbness, tingling or new weaknesses Behavioral/Psych: Mood is stable, no new changes  All other systems were reviewed with the patient and are negative.  I have reviewed the past medical history, past surgical history, social history and family history with the patient and they are unchanged from previous note.  ALLERGIES:  has No Known Allergies.  MEDICATIONS:  Current Outpatient Medications  Medication Sig Dispense Refill  . Budesonide-Formoterol Fumarate (SYMBICORT IN) Inhale 1 puff into the lungs 2 (two) times daily.     . fluticasone (FLONASE) 50 MCG/ACT nasal spray Place 1-2 sprays into both nostrils daily as needed for allergies or rhinitis.    . Travoprost, BAK Free, (TRAVATAN Z) 0.004 % SOLN ophthalmic solution Place 1 drop into both eyes at bedtime.     Current Facility-Administered Medications  Medication Dose Route Frequency Provider Last Rate Last Dose  . dexamethasone (DECADRON) injection 10 mg  10 mg Intravenous Once Roylee Chaffin, MD      . LORazepam (ATIVAN) injection 0.5 mg  0.5 mg Intravenous Once Cedricka Sackrider,  MD       Facility-Administered Medications Ordered in Other Visits  Medication Dose Route Frequency Provider Last Rate Last Dose  . sodium chloride flush (NS) 0.9 % injection 10 mL  10 mL Intracatheter PRN Heath Lark, MD   10 mL at 05/24/16 1615    PHYSICAL EXAMINATION: ECOG PERFORMANCE STATUS: 1 - Symptomatic but completely ambulatory  Vitals:   08/03/17 0815  BP: 132/88  Pulse: 63  Resp:  18  Temp: 98.5 F (36.9 C)  SpO2: 100%   Filed Weights   08/03/17 0815  Weight: 143 lb 6.4 oz (65 kg)    GENERAL:alert, no distress and comfortable SKIN: skin color, texture, turgor are normal, no rashes or significant lesions EYES: normal, Conjunctiva are pink and non-injected, sclera clear OROPHARYNX:no exudate, no erythema and lips, buccal mucosa, and tongue normal  NECK: Noted mild neck stiffness from prior radiation  lYMPH:  no palpable lymphadenopathy in the cervical, axillary or inguinal LUNGS: clear to auscultation and percussion with normal breathing effort HEART: regular rate & rhythm and no murmurs and no lower extremity edema ABDOMEN:abdomen soft, non-tender and normal bowel sounds Musculoskeletal:no cyanosis of digits and no clubbing  NEURO: alert & oriented x 3 with fluent speech, no focal motor/sensory deficits  LABORATORY DATA:  I have reviewed the data as listed    Component Value Date/Time   NA 139 07/30/2017 0802   K 4.9 07/30/2017 0802   CL 103 06/15/2017 1951   CO2 27 07/30/2017 0802   GLUCOSE 97 07/30/2017 0802   BUN 21.0 07/30/2017 0802   CREATININE 1.6 (H) 07/30/2017 0802   CALCIUM 10.1 07/30/2017 0802   PROT 8.0 07/30/2017 0802   ALBUMIN 4.3 07/30/2017 0802   AST 27 07/30/2017 0802   ALT 16 07/30/2017 0802   ALKPHOS 64 07/30/2017 0802   BILITOT 0.52 07/30/2017 0802   GFRNONAA 49 (L) 06/15/2017 1951   GFRAA 57 (L) 06/15/2017 1951    No results found for: SPEP, UPEP  Lab Results  Component Value Date   WBC 6.3 07/30/2017   NEUTROABS 3.9 07/30/2017   HGB 13.7 07/30/2017   HCT 40.3 07/30/2017   MCV 99.5 (H) 07/30/2017   PLT 200 07/30/2017      Chemistry      Component Value Date/Time   NA 139 07/30/2017 0802   K 4.9 07/30/2017 0802   CL 103 06/15/2017 1951   CO2 27 07/30/2017 0802   BUN 21.0 07/30/2017 0802   CREATININE 1.6 (H) 07/30/2017 0802      Component Value Date/Time   CALCIUM 10.1 07/30/2017 0802   ALKPHOS 64 07/30/2017  0802   AST 27 07/30/2017 0802   ALT 16 07/30/2017 0802   BILITOT 0.52 07/30/2017 0802       RADIOGRAPHIC STUDIES: I have reviewed the imaging study with the patient I have personally reviewed the radiological images as listed and agreed with the findings in the report. Ct Chest W Contrast  Result Date: 08/02/2017 CLINICAL DATA:  Tonsil cancer diagnosed August 2017 status post radiation therapy and chemotherapy. Follow-up pulmonary nodules. EXAM: CT CHEST WITH CONTRAST TECHNIQUE: Multidetector CT imaging of the chest was performed during intravenous contrast administration. CONTRAST:  75 cc Isovue-300 IV. COMPARISON:  06/15/2017 chest radiograph.   01/23/2017 chest CT. FINDINGS: Cardiovascular: Normal heart size. No significant pericardial fluid/thickening. Great vessels are normal in course and caliber. No central pulmonary emboli. Mediastinum/Nodes: No discrete thyroid nodules. Unremarkable esophagus. No pathologically enlarged axillary, mediastinal or hilar lymph nodes. Lungs/Pleura:  No pneumothorax. No pleural effusion. Irregular subsolid 1.8 x 1.1 cm apical right upper lobe pulmonary nodule (series 5/ image 24) with increased density including increased 1.1 cm solid component, previously 1.8 x 1.0 cm with 0.7 cm solid component on 01/23/2017 and 1.6 x 1.0 cm with 0.6 cm solid component on 10/06/2014 using similar measurement technique. Additional patchy ground-glass attenuation, subpleural reticulation and bandlike consolidation at both lung apices appears slightly decreased in size with slightly increased associated parenchymal distortion, suggesting evolving postinflammatory changes such as due to radiation therapy. No acute consolidative airspace disease or lung masses. Peripheral basilar left lower lobe 3 mm pulmonary nodule (series 5/image 153) appears stable since 10/04/2016, probably benign. No new significant pulmonary nodules. Upper abdomen: No acute abnormality. Musculoskeletal: No  aggressive appearing focal osseous lesions. Minimal thoracic spondylosis. IMPRESSION: 1. Irregular subsolid apical right upper lobe 1.8 cm pulmonary nodule is stable in size with slightly increased solid component in the interval. This nodule has increased in density and mildly increased in size since 10/06/2014 chest CT, which pre-dates the history of radiation therapy for the left tonsillar malignancy, which was diagnosed in August 2017. As such, an indolent/low grade primary bronchogenic adenocarcinoma remains on the differential. Continued surveillance is advised if surgical management or biopsy is not elected at this time. 2. Separate biapical lung opacities demonstrate changes suggestive of evolving postinflammatory change such as due to prior radiation therapy. 3. No thoracic adenopathy or other findings suggestive of metastatic disease in the chest. Electronically Signed   By: Ilona Sorrel M.D.   On: 08/02/2017 17:16    ASSESSMENT & PLAN:  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[facility]) His last CT imaging studies showed no evidence of cancer Clinically, I could not detect signs of disease I recommend close ENT follow-up   Multiple lung nodules on CT [name] [name] has mild enlarging lung nodules the patient is at risk of cancer I recommend PET/CT scan before referral to pulmonologist and [name] [name] agreed to proceed  Chronic kidney disease, stage III (moderate) Likely due to prior treatment His serum creatinine is stable I continue to encourage him increase fluids as tolerated  Neck stiffness This is likely related to prior radiation I recommend referral to physical therapy and rehab and [name] [name] agreed   Orders Placed This Encounter  Procedures  . NM PET Image Restag (PS) Skull Base To Thigh    Standing Status:   Future    Standing Expiration Date:   08/03/2018    Order Specific Question:   If indicated for the ordered procedure, I authorize the administration of a radiopharmaceutical per Radiology protocol     Answer:   Yes    Order Specific Question:   Preferred imaging location?    Answer:   [facility]    Order Specific Question:   Radiology Contrast Protocol - do NOT remove file path    Answer:   [email or web address]    Order Specific Question:   Reason for Exam additional comments    Answer:   Hx tonsil ca, enlarging lung nodules  . Ambulatory Referral to Physical Therapy    Referral Priority:   Routine    Referral Type:   Physical Medicine    Referral Reason:   Specialty Services Required    Requested Specialty:   Physical Therapy    Number of Visits Requested:   1  . Ambulatory referral to Pulmonology    Referral Priority:   Routine    Referral Type:   Consultation  Referral Reason:   Specialty Services Required    Requested Specialty:   Pulmonary Disease    Number of Visits Requested:   1   All questions were answered. The patient knows to call the clinic with any problems, questions or concerns. No barriers to learning was detected. I spent 25 minutes counseling the patient face to face. The total time spent in the appointment was 30 minutes and more than 50% was on counseling and review of test results     Heath Lark, MD 08/03/2017 3:51 PM

## 2017-08-03 NOTE — Assessment & Plan Note (Signed)
His last CT imaging studies showed no evidence of cancer Clinically, I could not detect signs of disease I recommend close ENT follow-up

## 2017-08-03 NOTE — Assessment & Plan Note (Signed)
He has mild enlarging lung nodules the patient is at risk of cancer I recommend PET/CT scan before referral to pulmonologist and he agreed to proceed

## 2017-08-03 NOTE — Assessment & Plan Note (Signed)
Likely due to prior treatment His serum creatinine is stable I continue to encourage him increase fluids as tolerated 

## 2017-08-03 NOTE — Telephone Encounter (Signed)
Gave avs and calendar for December  °

## 2017-08-03 NOTE — Assessment & Plan Note (Signed)
This is likely related to prior radiation I recommend referral to physical therapy and rehab and he agreed

## 2017-08-10 ENCOUNTER — Telehealth: Payer: Self-pay

## 2017-08-10 NOTE — Telephone Encounter (Signed)
Called and canceled appt for Monday. Scheduling will call him to schedule appt with Dr. Christiana Pellant after scan.

## 2017-08-10 NOTE — Telephone Encounter (Signed)
-----   Message from Heath Lark, MD sent at 08/09/2017 11:24 AM EST ----- Regarding: PET scan not scheduled If it does not get done by tomorrow, I will have to reschedule appt nxt week

## 2017-08-13 ENCOUNTER — Ambulatory Visit: Payer: Medicaid Other | Admitting: Hematology and Oncology

## 2017-08-14 ENCOUNTER — Other Ambulatory Visit: Payer: Self-pay | Admitting: Hematology and Oncology

## 2017-08-14 ENCOUNTER — Institutional Professional Consult (permissible substitution): Payer: Self-pay | Admitting: Internal Medicine

## 2017-08-15 ENCOUNTER — Telehealth: Payer: Self-pay | Admitting: Hematology and Oncology

## 2017-08-15 NOTE — Telephone Encounter (Signed)
Left message regarding appt ....  Per sched msg 12/11

## 2017-08-20 ENCOUNTER — Ambulatory Visit: Payer: Medicaid Other | Attending: Hematology and Oncology | Admitting: Physical Therapy

## 2017-08-21 ENCOUNTER — Institutional Professional Consult (permissible substitution): Payer: Self-pay | Admitting: Internal Medicine

## 2017-08-29 ENCOUNTER — Encounter (HOSPITAL_COMMUNITY): Payer: Medicaid Other

## 2017-08-31 ENCOUNTER — Emergency Department (HOSPITAL_COMMUNITY)
Admission: EM | Admit: 2017-08-31 | Discharge: 2017-08-31 | Discharge status: Against Medical Advice | Payer: Medicaid Other | Attending: Emergency Medicine | Admitting: Emergency Medicine

## 2017-08-31 ENCOUNTER — Encounter (HOSPITAL_COMMUNITY): Payer: Self-pay | Admitting: Emergency Medicine

## 2017-08-31 ENCOUNTER — Other Ambulatory Visit: Payer: Self-pay

## 2017-08-31 DIAGNOSIS — R300 Dysuria: Secondary | ICD-10-CM | POA: Insufficient documentation

## 2017-08-31 DIAGNOSIS — Z5321 Procedure and treatment not carried out due to patient leaving prior to being seen by health care provider: Secondary | ICD-10-CM | POA: Diagnosis not present

## 2017-08-31 LAB — URINALYSIS, ROUTINE W REFLEX MICROSCOPIC
BILIRUBIN URINE: NEGATIVE
Glucose, UA: NEGATIVE mg/dL
Hgb urine dipstick: NEGATIVE
KETONES UR: 5 mg/dL — AB
LEUKOCYTES UA: NEGATIVE
NITRITE: NEGATIVE
PROTEIN: NEGATIVE mg/dL
Specific Gravity, Urine: 1.014 (ref 1.005–1.030)
pH: 5 (ref 5.0–8.0)

## 2017-08-31 NOTE — ED Triage Notes (Signed)
Pt. Stated, ive had burring feeling for a month and frequency started last night

## 2017-08-31 NOTE — ED Notes (Signed)
Unable to locate pt in the lobby  

## 2017-09-03 ENCOUNTER — Encounter (HOSPITAL_COMMUNITY): Payer: Self-pay | Admitting: *Deleted

## 2017-09-03 ENCOUNTER — Other Ambulatory Visit: Payer: Self-pay

## 2017-09-03 ENCOUNTER — Emergency Department (HOSPITAL_COMMUNITY)
Admission: EM | Admit: 2017-09-03 | Discharge: 2017-09-03 | Disposition: A | Discharge status: Home / Self Care | Payer: Medicaid Other | Attending: Emergency Medicine | Admitting: Emergency Medicine

## 2017-09-03 DIAGNOSIS — B1920 Unspecified viral hepatitis C without hepatic coma: Secondary | ICD-10-CM | POA: Insufficient documentation

## 2017-09-03 DIAGNOSIS — F17210 Nicotine dependence, cigarettes, uncomplicated: Secondary | ICD-10-CM | POA: Insufficient documentation

## 2017-09-03 DIAGNOSIS — N183 Chronic kidney disease, stage 3 (moderate): Secondary | ICD-10-CM | POA: Diagnosis not present

## 2017-09-03 DIAGNOSIS — N410 Acute prostatitis: Secondary | ICD-10-CM

## 2017-09-03 DIAGNOSIS — R350 Frequency of micturition: Secondary | ICD-10-CM

## 2017-09-03 DIAGNOSIS — J449 Chronic obstructive pulmonary disease, unspecified: Secondary | ICD-10-CM | POA: Insufficient documentation

## 2017-09-03 LAB — CBC
HEMATOCRIT: 37.6 % — AB (ref 39.0–52.0)
HEMOGLOBIN: 12.7 g/dL — AB (ref 13.0–17.0)
MCH: 33.6 pg (ref 26.0–34.0)
MCHC: 33.8 g/dL (ref 30.0–36.0)
MCV: 99.5 fL (ref 78.0–100.0)
Platelets: 198 10*3/uL (ref 150–400)
RBC: 3.78 MIL/uL — AB (ref 4.22–5.81)
RDW: 13.6 % (ref 11.5–15.5)
WBC: 4.5 10*3/uL (ref 4.0–10.5)

## 2017-09-03 LAB — BASIC METABOLIC PANEL
ANION GAP: 7 (ref 5–15)
BUN: 22 mg/dL — ABNORMAL HIGH (ref 6–20)
CHLORIDE: 104 mmol/L (ref 101–111)
CO2: 26 mmol/L (ref 22–32)
Calcium: 9.5 mg/dL (ref 8.9–10.3)
Creatinine, Ser: 1.65 mg/dL — ABNORMAL HIGH (ref 0.61–1.24)
GFR calc non Af Amer: 44 mL/min — ABNORMAL LOW (ref >60)
GFR, EST AFRICAN AMERICAN: 51 mL/min — AB (ref >60)
GLUCOSE: 95 mg/dL (ref 65–99)
POTASSIUM: 4.1 mmol/L (ref 3.5–5.1)
Sodium: 137 mmol/L (ref 135–145)

## 2017-09-03 LAB — URINALYSIS, ROUTINE W REFLEX MICROSCOPIC
Bilirubin Urine: NEGATIVE
GLUCOSE, UA: NEGATIVE mg/dL
Hgb urine dipstick: NEGATIVE
KETONES UR: NEGATIVE mg/dL
LEUKOCYTES UA: NEGATIVE
Nitrite: NEGATIVE
PH: 5 (ref 5.0–8.0)
Protein, ur: NEGATIVE mg/dL
SPECIFIC GRAVITY, URINE: 1.014 (ref 1.005–1.030)

## 2017-09-03 MED ORDER — LEVOFLOXACIN 500 MG PO TABS: 500.0000 mg | ORAL_TABLET | Freq: Every day | ORAL | 0 refills | Status: DC

## 2017-09-03 NOTE — Discharge Instructions (Addendum)
Stop doxycycline. Take antibiotic as prescribed. Recheck with Dr. Marlou Sa in next week Drink plenty of fluids.

## 2017-09-03 NOTE — ED Triage Notes (Addendum)
To ED for eval of urine frequency. States he has had pain in bilarteral sides with radiation down to groin. States as soon as he drinks something he has to urinate and has been incontinent due to frequency. Pt was in ED on Friday but unable to wait to be seen

## 2017-09-03 NOTE — ED Provider Notes (Signed)
[place] EMERGENCY DEPARTMENT Provider Note   CSN: [id number] Arrival date & time: 09/03/17  3235     History   Chief Complaint Chief Complaint  Patient presents with  . Urinary Frequency    HPI [name] [name] is a 61 y.o. male.  HPI  61 y.o male complaining of urinary frequency.  STates increased uop increases with any po intake.  Saw Dr. Marlou Sa for pain between legs and checked prostate and told to take antibiotis.  Saw Dr. Marlou Sa December 26. PRescribed started Started on Saturday and takes twice a day. Taking doxycycline 100 mg bid.    Past Medical History:  Diagnosis Date  . Anxiety   . Cancer ([place]) 04/04/2016   cancer of left tonsil  . CKD (chronic kidney disease), stage III ([place])   . COPD (chronic obstructive pulmonary disease) ([place])   . Hepatitis   . Hepatitis C    Treatment x 8 weeks 6-7 months ago Har[facility]  . History of radiation therapy 05/23/16- 07/10/16   Left Tonsil and bilateral neck  . Sinus congestion   . Stomach ulcer     Patient Active Problem List   Diagnosis Date Noted  . Neck stiffness 08/03/2017  . Multiple lung nodules on CT 12/05/2016  . 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 Multifocal pneumonia 10/04/2016  . Chronic kidney disease, stage III (moderate) ([place]) 09/14/2016  . Dry mouth 09/14/2016  . Dysgeusia 09/05/2016  . Dehydration 09/01/2016  . Bronchitis 09/01/2016  . Diarrhea 09/01/2016  . Anemia due to chronic illness 08/24/2016  . Acute prerenal failure ([place]) 07/24/2016  . Hemoptysis 07/24/2016  . Pancytopenia, acquired ([place]) 07/04/2016  . Radiation dermatitis 07/04/2016  . Atypical chest pain 06/27/2016  . Bilateral tinnitus 05/30/2016  . Chemotherapy-induced nausea 05/30/2016  . Constipation, acute 05/30/2016  . Hypersensitivity reaction 05/23/2016  . Cancer associated pain 05/18/2016  . Lesion of right lung 04/26/2016  . 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  History of hepatitis C 04/24/2016  . Chest pain 09/17/2014  . Anxiety   . Dyspnea 03/25/2014  . COPD GOLD II 07/25/2013  . Sinusitis, chronic 07/25/2013    Past Surgical History:  Procedure Laterality Date  . COLONOSCOPY WITH PROPOFOL N/A 04/14/2015   Procedure: COLONOSCOPY WITH PROPOFOL;  Surgeon: Arta Silence, MD;  Location: [facility] ENDOSCOPY;  Service: Endoscopy;  Laterality: N/A;  . HERNIA REPAIR    . IR GENERIC HISTORICAL  05/17/2016   IR FLUORO GUIDE PORT INSERTION RIGHT 05/17/2016 Jacqulynn Cadet, MD [facility]-INTER[facility]  . IR GENERIC HISTORICAL  05/17/2016   IR US GUIDE VASC ACCESS RIGHT 05/17/2016 Jacqulynn Cadet, MD [facility]  . IR GENERIC HISTORICAL  05/17/2016   IR GASTROSTOMY TUBE MOD SED 05/17/2016 Jacqulynn Cadet, MD [facility]  . IR GENERIC HISTORICAL  11/13/2016   IR GASTROSTOMY TUBE REMOVAL 11/13/2016 Greggory Keen, MD [facility]  . IR GENERIC HISTORICAL  11/13/2016   IR REMOVAL TUN ACCESS W/ PORT W/O FL MOD SED 11/13/2016 Greggory Keen, MD [facility]  . MULTIPLE EXTRACTIONS WITH ALVEOLOPLASTY N/A 05/03/2016   Procedure: Multiple extractions with alveoloplasty and gross debridement of remaining teeth.;  Surgeon: Lenn Cal, DDS;  Location: [facility] ORS;  Service: Oral Surgery;  Laterality: N/A;  . STOMACH SURGERY     20 years       Home Medications    Prior to Admission medications   Medication Sig Start Date End Date Taking? Authorizing Provider  Budesonide-Formoterol Fumarate (  SYMBICORT IN) Inhale 1 puff into the lungs 2 (two) times daily.     [provider]  fluticasone (FLONASE) 50 MCG/ACT nasal spray Place 1-2 sprays into both nostrils daily as needed for allergies or rhinitis.    [provider]  Travoprost, BAK Free, (TRAVATAN Z) 0.004 % SOLN ophthalmic solution Place 1 drop into both eyes at bedtime. 04/11/17   [provider]    Family History Family History  Problem Relation Age of Onset  . Hypertension Mother   . Anxiety  disorder Mother   . Diabetes Mother   . Cancer Father     Social History Social History   Tobacco Use  . Smoking status: Current Some Day Smoker    Packs/day: 0.50    Years: 30.00    Pack years: 15.00    Types: Cigarettes  . Smokeless tobacco: Never Used  Substance Use Topics  . Alcohol use: Yes    Comment: QOD  . Drug use: No     Allergies   Patient has no known allergies.   Review of Systems Review of Systems  All other systems reviewed and are negative.    Physical Exam Updated Vital Signs BP (!) 120/103 (BP Location: Right Arm)   Pulse 74   Temp 97.7 F (36.5 C) (Oral)   Resp 16   SpO2 100%   Physical Exam  Constitutional: He is oriented to person, place, and time. He appears well-developed and well-nourished.  HENT:  Head: Normocephalic and atraumatic.  Eyes: EOM are normal. Pupils are equal, round, and reactive to light.  Neck: Normal range of motion.  Cardiovascular: Normal rate.  Pulmonary/Chest: Effort normal.  Abdominal: Soft.  suprapubic ttp  Neurological: He is alert and oriented to person, place, and time.  Skin: Skin is warm and dry. Capillary refill takes less than 2 seconds.  Psychiatric: He has a normal mood and affect.  Nursing note and vitals reviewed.    ED Treatments / Results  Labs (all labs ordered are listed, but only abnormal results are displayed) Labs Reviewed  BASIC METABOLIC PANEL - Abnormal; Notable for the following components:      Result Value   BUN 22 (*)    Creatinine, Ser 1.65 (*)    GFR calc non Af Amer 44 (*)    GFR calc Af Amer 51 (*)    All other components within normal limits  CBC - Abnormal; Notable for the following components:   RBC 3.78 (*)    Hemoglobin 12.7 (*)    HCT 37.6 (*)    All other components within normal limits  URINALYSIS, ROUTINE W REFLEX MICROSCOPIC    EKG  EKG Interpretation None       Radiology No results found.  Procedures Procedures (including critical care  time)  Medications Ordered in ED Medications - No data to display   Initial Impression / Assessment and Plan / ED Course  I have reviewed the triage vital signs and the nursing notes.  Pertinent labs & imaging results that were available during my care of the patient were reviewed by me and considered in my medical decision making (see chart for details).     Will d/c doxycycline.  Bladder with 220 cc now  Final Clinical Impressions(s) / ED Diagnoses   Final diagnoses:  Acute prostatitis  Urinary frequency    ED Discharge Orders        Ordered    levofloxacin (LEVAQUIN) 500 MG tablet  Daily  09/03/17 Pollock, Samil Mecham, MD 09/03/17 [phone]

## 2017-09-05 ENCOUNTER — Ambulatory Visit: Payer: Self-pay | Admitting: Hematology and Oncology

## 2017-09-12 ENCOUNTER — Telehealth: Payer: Self-pay | Admitting: *Deleted

## 2017-09-12 NOTE — Telephone Encounter (Signed)
I will see him on Monday at 330 pm. I will send scheduling msg

## 2017-09-12 NOTE — Telephone Encounter (Signed)
[name] [name] states his scan is scheduled for 1/14. He needs an appt with Dr Alvy Bimler

## 2017-09-17 ENCOUNTER — Encounter (HOSPITAL_COMMUNITY)
Admission: RE | Admit: 2017-09-17 | Discharge: 2017-09-17 | Disposition: A | Discharge status: Home / Self Care | Payer: Medicaid Other | Source: Ambulatory Visit | Attending: Hematology and Oncology | Admitting: Hematology and Oncology

## 2017-09-17 ENCOUNTER — Inpatient Hospital Stay: Payer: Medicaid Other

## 2017-09-17 ENCOUNTER — Telehealth: Payer: Self-pay | Admitting: Hematology and Oncology

## 2017-09-17 ENCOUNTER — Encounter: Payer: Self-pay | Admitting: Hematology and Oncology

## 2017-09-17 ENCOUNTER — Inpatient Hospital Stay: Payer: Medicaid Other | Attending: Hematology and Oncology | Admitting: Hematology and Oncology

## 2017-09-17 VITALS — BP 131/76 | HR 88 | Temp 98.2°F | Resp 18 | Ht 73.0 in | Wt 138.1 lb

## 2017-09-17 DIAGNOSIS — C090 Malignant neoplasm of tonsillar fossa: Secondary | ICD-10-CM

## 2017-09-17 DIAGNOSIS — R5383 Other fatigue: Secondary | ICD-10-CM | POA: Insufficient documentation

## 2017-09-17 DIAGNOSIS — F17210 Nicotine dependence, cigarettes, uncomplicated: Secondary | ICD-10-CM

## 2017-09-17 DIAGNOSIS — Z923 Personal history of irradiation: Secondary | ICD-10-CM | POA: Diagnosis not present

## 2017-09-17 DIAGNOSIS — Z9221 Personal history of antineoplastic chemotherapy: Secondary | ICD-10-CM

## 2017-09-17 DIAGNOSIS — D631 Anemia in chronic kidney disease: Secondary | ICD-10-CM | POA: Diagnosis not present

## 2017-09-17 DIAGNOSIS — Z79899 Other long term (current) drug therapy: Secondary | ICD-10-CM | POA: Diagnosis not present

## 2017-09-17 DIAGNOSIS — D638 Anemia in other chronic diseases classified elsewhere: Secondary | ICD-10-CM

## 2017-09-17 DIAGNOSIS — N183 Chronic kidney disease, stage 3 unspecified: Secondary | ICD-10-CM

## 2017-09-17 DIAGNOSIS — R911 Solitary pulmonary nodule: Secondary | ICD-10-CM

## 2017-09-17 DIAGNOSIS — Z720 Tobacco use: Secondary | ICD-10-CM

## 2017-09-17 DIAGNOSIS — I700 Atherosclerosis of aorta: Secondary | ICD-10-CM

## 2017-09-17 DIAGNOSIS — Z85818 Personal history of malignant neoplasm of other sites of lip, oral cavity, and pharynx: Secondary | ICD-10-CM | POA: Diagnosis present

## 2017-09-17 LAB — CBC WITH DIFFERENTIAL/PLATELET
BASOS ABS: 0 10*3/uL (ref 0.0–0.1)
Basophils Relative: 0 %
Eosinophils Absolute: 0.1 10*3/uL (ref 0.0–0.5)
Eosinophils Relative: 3 %
HEMATOCRIT: 37.7 % — AB (ref 38.4–49.9)
HEMOGLOBIN: 12.8 g/dL — AB (ref 13.0–17.1)
Lymphocytes Relative: 17 %
Lymphs Abs: 0.9 10*3/uL (ref 0.9–3.3)
MCH: 33.9 pg — ABNORMAL HIGH (ref 27.2–33.4)
MCHC: 34 g/dL (ref 32.0–36.0)
MCV: 99.7 fL — AB (ref 79.3–98.0)
MONO ABS: 0.4 10*3/uL (ref 0.1–0.9)
Monocytes Relative: 8 %
NEUTROS ABS: 3.9 10*3/uL (ref 1.5–6.5)
NEUTROS PCT: 72 %
Platelets: 177 10*3/uL (ref 140–400)
RBC: 3.78 MIL/uL — ABNORMAL LOW (ref 4.20–5.82)
RDW: 14.1 % (ref 11.0–15.6)
WBC: 5.3 10*3/uL (ref 4.0–10.3)

## 2017-09-17 LAB — COMPREHENSIVE METABOLIC PANEL
ALBUMIN: 4.2 g/dL (ref 3.5–5.0)
ALK PHOS: 60 U/L (ref 40–150)
ALT: 16 U/L (ref 0–55)
AST: 29 U/L (ref 5–34)
Anion gap: 10 (ref 3–11)
BILIRUBIN TOTAL: 0.6 mg/dL (ref 0.2–1.2)
BUN: 22 mg/dL (ref 7–26)
CO2: 27 mmol/L (ref 22–29)
CREATININE: 1.6 mg/dL — AB (ref 0.70–1.30)
Calcium: 9.8 mg/dL (ref 8.4–10.4)
Chloride: 105 mmol/L (ref 98–109)
GFR calc Af Amer: 53 mL/min — ABNORMAL LOW (ref >60)
GFR, EST NON AFRICAN AMERICAN: 46 mL/min — AB (ref >60)
GLUCOSE: 97 mg/dL (ref 70–140)
Potassium: 4.6 mmol/L (ref 3.5–5.1)
Sodium: 142 mmol/L (ref 136–145)
TOTAL PROTEIN: 7.6 g/dL (ref 6.4–8.3)

## 2017-09-17 LAB — GLUCOSE, CAPILLARY: Glucose-Capillary: 86 mg/dL (ref 65–99)

## 2017-09-17 MED ORDER — FLUDEOXYGLUCOSE F - 18 (FDG) INJECTION
6.6900 | Freq: Once | INTRAVENOUS | Status: AC | PRN
  Administered 2017-09-17: 6.69 via INTRAVENOUS

## 2017-09-17 NOTE — Progress Notes (Signed)
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 Chronic small-vessel ischemic changes of the cerebral hemispheric white matter.       04/04/2016 Imaging    CT neck showed left tonsillar mass with diameter of 19 x 24 mm consistent with 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 necrosis. Suspicious level 5/supraclavicular nodes on the left. Scar type density in the right upper lobe is more prominent than was seen on a CT scan of the chest 10/06/2014      04/21/2016 PET scan    Asymmetric hypermetabolic soft tissue prominence in left palatine tonsil, consistent with known primary left tonsillar squamous cell Carcinoma. Left level 2 cervical lymphadenopathy, consistent with metastatic disease. No evidence of metastatic disease within the chest, abdomen, or pelvis. Stable 14 mm ground-glass and part solid nodular opacity in right lung apex compared to previous CT in 2016. This shows low-grade metabolic activity. Low-grade bronchogenic adenocarcinoma cannot be excluded. Consider surgical resection versus continued followup by CT in 12 months      05/17/2016 Procedure    Successful placement of a  20 French pull through gastrostomy tube & right IJ approach Power [facility].      05/22/2016 - 07/03/2016 Chemotherapy    He received 3 doses of high dose cisplatin with radiation       05/23/2016 - 07/10/2016 Radiation Therapy    Rec'd Helical IMRT:  Left tonsil and bilateral neck / 70 Gy in 35 fractions to gross disease, 63 Gy in 35 fractions to high risk nodal echelons, and 56 Gy in 35 fractions to intermediate risk nodal echelons.          10/04/2016 - 10/07/2016 Hospital Admission    He was admitted due to atypical chest pain from illicit drug use. CT scan showed possible atypical pneumonia versus radiation pneumonitis      10/04/2016 Imaging    Ct scan showed no evidence of pulmonary embolus. 2. Patchy bilateral airspace opacities, at the upper lobes, compatible with multifocal pneumonia. This corresponds to the finding on recent chest radiograph. 3. Previously noted ground-glass and solid nodule at the right lung apex demonstrates increased solid components. The solid component now measures 1.2 cm. 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 Tissue diagnosis would be helpful, as deemed clinically appropriate.      10/26/2016 PET scan    Slight increasing solid component of a right apical lung lesion but no concerning hypermetabolism. Recommend continued surveillance. 2. Bilateral patchy apical airspace opacities are mild to moderately hypermetabolic and likely reflect infection. Recommend short-term followup noncontrast chest CT in 3 months to reassess. 3. No mediastinal or hilar mass or adenopathy. 4. No significant findings in the abdomen/pelvis. 5. Stable lucencies  throughout the bony pelvis but no hypermetabolism to suggest metastasis.      11/13/2016 Procedure    Successful right IJ vein Port-A-Cath explant.      11/13/2016 Procedure    Uncomplicated gastrostomy removal      01/23/2017 Imaging    Ct neck: 1. Sequelae of radiation  therapy in the neck. The oropharynx level of this neck CT is degraded by motion artifact. Still, soft tissue volume has definitely decreased at the level of the 2017 left tonsillar mass, suggesting positive response to treatment and concordant with the negative findings on the February PET-CT. 2. Normalized left level 2 metastatic lymph node. No abnormal cervical lymph nodes today. 3. Chest CT findings today are reported separately.      01/23/2017 Imaging    Ct chest: 1. Stable biapical pleural and parenchymal scarring changes with patchy airspace opacities and interstitial thickening most likely representing postinflammatory/postinfectious scarring changes. No new pulmonary lesions to suggest metastatic disease. Recommend six-month follow-up noncontrast chest CT to reassess. 2. Stable emphysematous changes. 3. No mediastinal or hilar mass or adenopathy.      08/02/2017 Imaging    1. Irregular subsolid apical right upper lobe 1.8 cm pulmonary nodule is stable in size with slightly increased solid component in the interval. This nodule has increased in density and mildly increased in size since 10/06/2014 chest CT, which pre-dates the history of radiation therapy for the left tonsillar malignancy, which was diagnosed in August 2017. As such, an indolent/low grade primary bronchogenic adenocarcinoma remains on the differential. Continued surveillance is advised if surgical management or biopsy is not elected at this time. 2. Separate biapical lung opacities demonstrate changes suggestive of evolving postinflammatory change such as due to prior radiation therapy. 3. No thoracic adenopathy or other findings suggestive of metastatic disease in the chest.      09/17/2017 PET scan    1. No findings to suggest recurrent or metastatic head/neck primary. 2. Right apical mixed attenuation nodule is similar in size to most recent CT and similar in hypermetabolism compared to 10/26/2016. As on the recent CT, slow  interval growth suggests indolent neoplasm such as low-grade adenocarcinoma. 3. Aortic Atherosclerosis (ICD10-I70.0). 4. Stable appearance of non FDG avid lucencies throughout the bony pelvis. These warrant ongoing follow-up attention. 5. Trace cul-de-sac fluid, new or increased since the prior PET.       INTERVAL HISTORY: Please see below for problem oriented charting. He returns to review PET CT scan He denies major cough, recent infection, fever or chills He continued to smoke intermittently He denies swallowing difficulties No new lymphadenopathy  REVIEW OF SYSTEMS:   Constitutional: Denies fevers, chills or abnormal weight loss Eyes: Denies blurriness of vision Ears, nose, mouth, throat, and face: Denies mucositis or sore throat Respiratory: Denies cough, dyspnea or wheezes Cardiovascular: Denies palpitation, chest discomfort or lower extremity swelling Gastrointestinal:  Denies nausea, heartburn or change in bowel habits Skin: Denies abnormal skin rashes Lymphatics: Denies new lymphadenopathy or easy bruising Neurological:Denies numbness, tingling or new weaknesses Behavioral/Psych: Mood is stable, no new changes  All other systems were reviewed with the patient and are negative.  I have reviewed the past medical history, past surgical history, social history and family history with the patient and they are unchanged from previous note.  ALLERGIES:  has No Known Allergies.  MEDICATIONS:  Current Outpatient Medications  Medication Sig Dispense Refill  . Budesonide-Formoterol Fumarate (SYMBICORT IN) Inhale 1 puff into the lungs 2 (two) times daily.     Marland Kitchen  fluticasone (FLONASE) 50 MCG/ACT nasal spray Place 1-2 sprays into both nostrils daily as needed for allergies or rhinitis.    . Travoprost, BAK Free, (TRAVATAN Z) 0.004 % SOLN ophthalmic solution Place 1 drop into both eyes at bedtime.     Current Facility-Administered Medications  Medication Dose Route Frequency  Provider Last Rate Last Dose  . dexamethasone (DECADRON) injection 10 mg  10 mg Intravenous Once Alvy Bimler, Marimar Suber, MD      . LORazepam (ATIVAN) injection 0.5 mg  0.5 mg Intravenous Once Heath Lark, MD       Facility-Administered Medications Ordered in Other Visits  Medication Dose Route Frequency Provider Last Rate Last Dose  . sodium chloride flush (NS) 0.9 % injection 10 mL  10 mL Intracatheter PRN Heath Lark, MD   10 mL at 05/24/16 1615    PHYSICAL EXAMINATION: ECOG PERFORMANCE STATUS: 1 - Symptomatic but completely ambulatory  Vitals:   09/17/17 1502  BP: 131/76  Pulse: 88  Resp: 18  Temp: 98.2 F (36.8 C)  SpO2: 100%   Filed Weights   09/17/17 1502  Weight: 138 lb 1.6 oz (62.6 kg)    GENERAL:alert, no distress and comfortable SKIN: skin color, texture, turgor are normal, no rashes or significant lesions EYES: normal, Conjunctiva are pink and non-injected, sclera clear OROPHARYNX:no exudate, no erythema and lips, buccal mucosa, and tongue normal  NECK: supple, thyroid normal size, non-tender, without nodularity LYMPH:  no palpable lymphadenopathy in the cervical, axillary or inguinal LUNGS: clear to auscultation and percussion with normal breathing effort HEART: regular rate & rhythm and no murmurs and no lower extremity edema ABDOMEN:abdomen soft, non-tender and normal bowel sounds Musculoskeletal:no cyanosis of digits and no clubbing  NEURO: alert & oriented x 3 with fluent speech, no focal motor/sensory deficits  LABORATORY DATA:  I have reviewed the data as listed    Component Value Date/Time   NA 142 09/17/2017 1529   NA 139 07/30/2017 0802   K 4.6 09/17/2017 1529   K 4.9 07/30/2017 0802   CL 105 09/17/2017 1529   CO2 27 09/17/2017 1529   CO2 27 07/30/2017 0802   GLUCOSE 97 09/17/2017 1529   GLUCOSE 97 07/30/2017 0802   BUN 22 09/17/2017 1529   BUN 21.0 07/30/2017 0802   CREATININE 1.60 (H) 09/17/2017 1529   CREATININE 1.6 (H) 07/30/2017 0802   CALCIUM 9.8  09/17/2017 1529   CALCIUM 10.1 07/30/2017 0802   PROT 7.6 09/17/2017 1529   PROT 8.0 07/30/2017 0802   ALBUMIN 4.2 09/17/2017 1529   ALBUMIN 4.3 07/30/2017 0802   AST 29 09/17/2017 1529   AST 27 07/30/2017 0802   ALT 16 09/17/2017 1529   ALT 16 07/30/2017 0802   ALKPHOS 60 09/17/2017 1529   ALKPHOS 64 07/30/2017 0802   BILITOT 0.6 09/17/2017 1529   BILITOT 0.52 07/30/2017 0802   GFRNONAA 46 (L) 09/17/2017 1529   GFRAA 53 (L) 09/17/2017 1529    No results found for: SPEP, UPEP  Lab Results  Component Value Date   WBC 5.3 09/17/2017   NEUTROABS 3.9 09/17/2017   HGB 12.8 (L) 09/17/2017   HCT 37.7 (L) 09/17/2017   MCV 99.7 (H) 09/17/2017   PLT 177 09/17/2017      Chemistry      Component Value Date/Time   NA 142 09/17/2017 1529   NA 139 07/30/2017 0802   K 4.6 09/17/2017 1529   K 4.9 07/30/2017 0802   CL 105 09/17/2017 1529   CO2 27 09/17/2017  1529   CO2 27 07/30/2017 0802   BUN 22 09/17/2017 1529   BUN 21.0 07/30/2017 0802   CREATININE 1.60 (H) 09/17/2017 1529   CREATININE 1.6 (H) 07/30/2017 0802      Component Value Date/Time   CALCIUM 9.8 09/17/2017 1529   CALCIUM 10.1 07/30/2017 0802   ALKPHOS 60 09/17/2017 1529   ALKPHOS 64 07/30/2017 0802   AST 29 09/17/2017 1529   AST 27 07/30/2017 0802   ALT 16 09/17/2017 1529   ALT 16 07/30/2017 0802   BILITOT 0.6 09/17/2017 1529   BILITOT 0.52 07/30/2017 0802       RADIOGRAPHIC STUDIES: I have reviewed multiple imaging study with the patient I have personally reviewed the radiological images as listed and agreed with the findings in the report. Nm Pet Image Restag (ps) Skull Base To Thigh  Result Date: 09/17/2017 CLINICAL DATA:  Subsequent treatment strategy for restaging of head neck cancer. Right apical pulmonary lesion. EXAM: NUCLEAR MEDICINE PET SKULL BASE TO THIGH TECHNIQUE: 6.7 mCi F-18 FDG was injected intravenously. Full-ring PET imaging was performed from the skull base to thigh after the radiotracer. CT  data was obtained and used for attenuation correction and anatomic localization. FASTING BLOOD GLUCOSE:  Value: 86 mg/dl COMPARISON:  PET of 10/26/2016. Chest CT of 04/26/2017 most recent neck CT of 01/23/2017. FINDINGS: NECK: No mucosal primary or cervical nodal hypermetabolism identified. No cervical adenopathy. CHEST: Hypermetabolism corresponding to the right apical mixed attenuation nodule. This measures on the order of 1.8 cm and a S.U.V. max of 2.0 today. Similar in size on 08/02/2017 CT. 1.5 cm and a S.U.V. max of 2.0 on the prior PET (when remeasured). Other right apical opacities, likely radiation induced, are not significantly hypermetabolic. No thoracic nodal hypermetabolism. Heart size accentuated by a pectus excavatum deformity. ABDOMEN/PELVIS: No abdominopelvic parenchymal or nodal hypermetabolism. Abdominal aortic atherosclerosis. Interval removal of gastrostomy. Trace cul-de-sac fluid including on image 181/series 4, felt to be new or increased since the prior. SKELETON: No abnormal marrow activity. Again identified are areas of heterogeneous marrow density throughout the pelvis. Grossly similar back to 04/21/2016. IMPRESSION: 1. No findings to suggest recurrent or metastatic head/neck primary. 2. Right apical mixed attenuation nodule is similar in size to most recent CT and similar in hypermetabolism compared to 10/26/2016. As on the recent CT, slow interval growth suggests indolent neoplasm such as low-grade adenocarcinoma. 3.  Aortic Atherosclerosis (ICD10-I70.0). 4. Stable appearance of non FDG avid lucencies throughout the bony pelvis. These warrant ongoing follow-up attention. 5. Trace cul-de-sac fluid, new or increased since the prior PET. Electronically Signed   By: Abigail Miyamoto M.D.   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  Appointment is pending next week I would like him to be reviewed further to see if referral to cardiothoracic surgery is appropriate  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 The patient denies recent history of bleeding such as epistaxis, hematuria or hematochezia. He is asymptomatic from the anemia. We will observe for  now.  He does not require transfusion now.    Chronic kidney disease, stage III (moderate) Likely due to prior treatment His serum creatinine is stable I continue to encourage him increase fluids as tolerated  Tobacco abuse The patient continues to smoke intermittently We discussed the importance of nicotine cessation   Orders Placed This Encounter  Procedures  . Comprehensive metabolic panel    Standing Status:   Future    Number of Occurrences:   1    Standing Expiration Date:   10/22/2018  . CBC with Differential/Platelet    Standing Status:   Future    Number of Occurrences:   1    Standing Expiration Date:   10/22/2018  . TSH    Standing Status:   Future    Number of Occurrences:   1    Standing Expiration Date:   10/22/2018   All questions were answered. The patient knows to call the clinic with any problems, questions or concerns. No barriers to learning was detected. I spent 25 minutes counseling the patient face to face. The total time spent in the appointment was 30 minutes and more than 50% was on counseling and review of test results     Heath Lark, MD 09/17/2017 5:24 PM

## 2017-09-17 NOTE — Assessment & Plan Note (Signed)
I have reviewed her recent PET/CT scan with the patient He has no signs of cancer recurrence within the oropharynx but is noted to have enlarging right upper lung nodule Given his significant smoking history, primary lung cancer cannot be excluded Sometimes, radiation induced pneumonitis can also cause abnormal lung nodules but typically the nodules should not be enlarging if they are benign in nature He is being referred to pulmonologist.  Appointment is pending next week I would like him to be reviewed further to see if referral to cardiothoracic surgery is appropriate

## 2017-09-17 NOTE — Assessment & Plan Note (Signed)
Likely due to prior treatment His serum creatinine is stable I continue to encourage him increase fluids as tolerated 

## 2017-09-17 NOTE — Assessment & Plan Note (Signed)
This is likely anemia of chronic disease. The patient denies recent history of bleeding such as epistaxis, hematuria or hematochezia. He is asymptomatic from the anemia. We will observe for now.  He does not require transfusion now.   

## 2017-09-17 NOTE — Assessment & Plan Note (Signed)
The patient continues to smoke intermittently We discussed the importance of nicotine cessation 

## 2017-09-17 NOTE — Telephone Encounter (Signed)
Gave patient avs and calendar with appts per 1/14 sch msg.

## 2017-09-18 ENCOUNTER — Telehealth: Payer: Self-pay

## 2017-09-18 LAB — TSH: TSH: 0.767 u[IU]/mL (ref 0.320–4.118)

## 2017-09-18 NOTE — Telephone Encounter (Signed)
-----   Message from Heath Lark, MD sent at 09/18/2017  9:25 AM EST ----- Regarding: TSH normal PLs let him know his thyroid function is normal

## 2017-09-18 NOTE — Telephone Encounter (Signed)
Called with below message. Verbalized understanding. 

## 2017-09-25 ENCOUNTER — Ambulatory Visit (INDEPENDENT_AMBULATORY_CARE_PROVIDER_SITE_OTHER): Payer: Medicaid Other | Admitting: Internal Medicine

## 2017-09-25 ENCOUNTER — Encounter: Payer: Self-pay | Admitting: Internal Medicine

## 2017-09-25 VITALS — BP 126/72 | HR 85 | Ht 73.5 in | Wt 137.0 lb

## 2017-09-25 DIAGNOSIS — R911 Solitary pulmonary nodule: Secondary | ICD-10-CM | POA: Diagnosis not present

## 2017-09-25 DIAGNOSIS — Z87891 Personal history of nicotine dependence: Secondary | ICD-10-CM | POA: Diagnosis not present

## 2017-09-25 DIAGNOSIS — Z8709 Personal history of other diseases of the respiratory system: Secondary | ICD-10-CM

## 2017-09-25 DIAGNOSIS — R918 Other nonspecific abnormal finding of lung field: Secondary | ICD-10-CM | POA: Diagnosis not present

## 2017-09-25 NOTE — Progress Notes (Signed)
Subjective:    Patient ID: [name] [name], male    DOB: 1955/11/03, 62 y.o.   MRN: [id number]  PCP Rogers Blocker, MD  HPI  IOV 09/25/2017   Chief Complaint  Patient presents with  . Advice Only    Referred by Dr. Alvy Bimler due to mult. lung nodules.  CT scan done 08/02/17 and PET scan done 09/17/17. Pt states that he had throat cancer and from where the port was placed on the right side of his chest was where the spots were seen.    62 year old male who is ongoing smoker.  He is a chart history of Gold stage II COPD.  He has been referred by oncology for evaluation of multiple lung nodules particularly a right upper lobe nodule in the apex that is continuing to grow since 2016.  History is gained from talking to him, review of the chart.  As best as I can determine in 2016 he had a CT scan of the chest that I personally visualized and shows a right upper lobe nodule.  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  He did undergo radiation therapy for this.  He also has support on the right side.  Review of the chart shows that he had recurrence and then repeated treatment and now is in remission again with continued observation treatment.  He is undergone surveillance scans.  Most recently November 2018 he underwent CT scan of the chest that showed the right upper lobe nodule has increased in density compared to 2016 and also in size.  This was followed up by a PET scan in January 2019 that showed the SUV is similar to a year earlier with the size of sleep definitely increased over time.  At this point in time he is feeling stable without much of shortness of breath or cough or wheezing.  He prefers to have continued surveillance.  Of note his images do show other findings  of radiation but this right upper lobe nodule is different from those.  He is not on any inhaler therapy.  He is aware of the risks of biopsy and surgical resection and the limits  of diagnosis and false positivity.  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[place])   04/04/2016 Initial Diagnosis    He saw ENT and had laryngoscopy and biopsy of left tonsil      04/04/2016 Pathology Results    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 Chronic small-vessel ischemic changes of the cerebral hemispheric white matter.       04/04/2016 Imaging    CT neck showed left tonsillar mass with diameter of 19 x 24 mm consistent with 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 necrosis. Suspicious level 5/supraclavicular nodes on the left. Scar type density in the right upper lobe is more prominent than was seen on a CT scan of the chest 10/06/2014      04/21/2016 PET scan    Asymmetric hypermetabolic soft tissue prominence in left palatine tonsil, consistent with known primary left tonsillar squamous cell Carcinoma. Left level 2 cervical lymphadenopathy, consistent with metastatic disease. No evidence of metastatic disease within the chest, abdomen, or pelvis. Stable 14 mm ground-glass and part solid nodular opacity in right lung apex compared to previous CT in 2016. This shows low-grade metabolic activity. Low-grade bronchogenic adenocarcinoma  cannot be excluded. Consider surgical resection versus continued followup by CT in 12 months      05/17/2016 Procedure    Successful placement of a 20 French pull through gastrostomy tube & right IJ approach [employer].      05/22/2016 - 07/03/2016 Chemotherapy    He received 3 doses of high dose cisplatin with radiation       05/23/2016 - 07/10/2016 Radiation Therapy    Rec'd Helical IMRT:  Left tonsiland bilateralneck / 70 Gy in 35 fractions to gross disease, 63 Gy in 35 fractions to high risk nodal echelons, and 56 Gy in 35 fractions to intermediate risk nodal  echelons.        10/04/2016 - 10/07/2016 Hospital Admission    He was admitted due to atypical chest pain from illicit drug use. CT scan showed possible atypical pneumonia versus radiation pneumonitis      10/04/2016 Imaging    Ct scan showed no evidence of pulmonary embolus. 2. Patchy bilateral airspace opacities, at the upper lobes, compatible with multifocal pneumonia. This corresponds to the finding on recent chest radiograph. 3. Previously noted ground-glass and solid nodule at the right lung apex demonstrates increased solid components. The solid component now measures 1.2 cm. 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 Tissue diagnosis would be helpful, as deemed clinically appropriate.      10/26/2016 PET scan    Slight increasing solid component of a right apical lung lesion but no concerning hypermetabolism. Recommend continued surveillance. 2. Bilateral patchy apical airspace opacities are mild to moderately hypermetabolic and likely reflect infection. Recommend short-term followup noncontrast chest CT in 3 months to reassess. 3. No mediastinal or hilar mass or adenopathy. 4. No significant findings in the abdomen/pelvis. 5. Stable lucencies throughout the bony pelvis but no hypermetabolism to suggest metastasis.      11/13/2016 Procedure    Successful right IJ vein Port-A-Cath explant.      11/13/2016 Procedure    Uncomplicated gastrostomy removal      01/23/2017 Imaging    Ct neck: 1. Sequelae of radiation therapy in the neck. The oropharynx level of this neck CT is degraded by motion artifact. Still, soft tissue volume has definitely decreased at the level of the 2017 left tonsillar mass, suggesting positive response to treatment and concordant with the negative findings on the February PET-CT. 2. Normalized left level 2 metastatic lymph node. No abnormal cervical lymph nodes today. 3. Chest CT  findings today are reported separately.      01/23/2017 Imaging    Ct chest: 1. Stable biapical pleural and parenchymal scarring changes with patchy airspace opacities and interstitial thickening most likely representing postinflammatory/postinfectious scarring changes. No new pulmonary lesions to suggest metastatic disease. Recommend six-month follow-up noncontrast chest CT to reassess. 2. Stable emphysematous changes. 3. No mediastinal or hilar mass or adenopathy.      08/02/2017 Imaging    1. Irregular subsolid apical right upper lobe 1.8 cm pulmonary nodule is stable in size with slightly increased solid component in the interval. This nodule has increased in density and mildly increased in size since 10/06/2014 chest CT, which pre-dates the history of radiation therapy for the left tonsillar malignancy, which was diagnosed in August 2017. As such, an indolent/low grade primary bronchogenic adenocarcinoma remains on the differential. Continued surveillance is advised if surgical management or biopsy is not elected at this time. 2. Separate biapical lung opacities demonstrate changes suggestive of evolving postinflammatory change such as due  to prior radiation therapy. 3. No thoracic adenopathy or other findings suggestive of metastatic disease in the chest.        has a past medical history of Anxiety, Cancer ([place]) (04/04/2016), CKD (chronic kidney disease), stage III ([place]), COPD (chronic obstructive pulmonary disease) ([place]), Hepatitis, Hepatitis C, History of radiation therapy (05/23/16- 07/10/16), Sinus congestion, and Stomach ulcer.   reports that he has been smoking cigarettes.  He has a 15.00 pack-year smoking history. he has never used smokeless tobacco.  Past Surgical History:  Procedure Laterality Date  . COLONOSCOPY WITH PROPOFOL N/A 04/14/2015   Procedure: COLONOSCOPY WITH PROPOFOL;  Surgeon: Arta Silence, MD;  Location: [facility];  Service: Endoscopy;  Laterality:  N/A;  . HERNIA REPAIR    . IR GENERIC HISTORICAL  05/17/2016   IR FLUORO GUIDE PORT INSERTION RIGHT 05/17/2016 Jacqulynn Cadet, MD [facility]  . IR GENERIC HISTORICAL  05/17/2016   IR US GUIDE VASC ACCESS RIGHT 05/17/2016 Jacqulynn Cadet, MD [facility]  . IR GENERIC HISTORICAL  05/17/2016   IR GASTROSTOMY TUBE MOD SED 05/17/2016 Jacqulynn Cadet, MD [facility]  . IR GENERIC HISTORICAL  11/13/2016   IR GASTROSTOMY TUBE REMOVAL 11/13/2016 Greggory Keen, MD [facility]  . IR GENERIC HISTORICAL  11/13/2016   IR REMOVAL TUN ACCESS W/ PORT W/O FL MOD SED 11/13/2016 Greggory Keen, MD [facility]  . MULTIPLE EXTRACTIONS WITH ALVEOLOPLASTY N/A 05/03/2016   Procedure: Multiple extractions with alveoloplasty and gross debridement of remaining teeth.;  Surgeon: Lenn Cal, DDS;  Location: [facility];  Service: Oral Surgery;  Laterality: N/A;  . STOMACH SURGERY     20 years    No Known Allergies  Immunization History  Administered Date(s) Administered  . Influenza Split 06/04/2013, 07/26/2017  . Influenza,inj,Quad PF,6+ Mos 05/18/2016  . Pneumococcal Polysaccharide-23 10/06/2016    Family History  Problem Relation Age of Onset  . Hypertension Mother   . Anxiety disorder Mother   . Diabetes Mother   . Cancer Father      Current Outpatient Medications:  .  aspirin EC 81 MG tablet, Take 81 mg by mouth daily., Disp: , Rfl:  .  Budesonide-Formoterol Fumarate (SYMBICORT IN), Inhale 1 puff into the lungs 2 (two) times daily. , Disp: , Rfl:  .  fluticasone (FLONASE) 50 MCG/ACT nasal spray, Place 1-2 sprays into both nostrils daily as needed for allergies or rhinitis., Disp: , Rfl:  .  Travoprost, BAK Free, (TRAVATAN Z) 0.004 % SOLN ophthalmic solution, Place 1 drop into both eyes at bedtime., Disp: , Rfl:  .  PROAIR HFA 108 (90 Base) MCG/ACT inhaler, inhale 1 puff by mouth every 6 hours if needed, Disp: , Rfl: 0  Current Facility-Administered Medications:  .  dexamethasone (DECADRON)  injection 10 mg, 10 mg, Intravenous, Once, Gorsuch, Ni, MD .  LORazepam (ATIVAN) injection 0.5 mg, 0.5 mg, Intravenous, Once, Alvy Bimler, Ni, MD  Facility-Administered Medications Ordered in Other Visits:  .  sodium chloride flush (NS) 0.9 % injection 10 mL, 10 mL, Intracatheter, PRN, Alvy Bimler, Ni, MD, 10 mL at 05/24/16 1615   Review of Systems  Constitutional: Positive for unexpected weight change. Negative for fever.  HENT: Positive for congestion, dental problem, postnasal drip, sinus pressure, sneezing and trouble swallowing. Negative for ear pain, nosebleeds, rhinorrhea and sore throat.   Eyes: Negative for redness and itching.  Respiratory: Positive for chest tightness and shortness of breath. Negative for wheezing.   Cardiovascular: Negative for palpitations and leg swelling.  Gastrointestinal: Negative for  nausea and vomiting.  Genitourinary: Negative for dysuria.  Musculoskeletal: Negative for joint swelling.  Skin: Negative for rash.  Allergic/Immunologic: Negative.  Negative for environmental allergies, food allergies and immunocompromised state.  Neurological: Negative for headaches.  Hematological: Does not bruise/bleed easily.  Psychiatric/Behavioral: Negative for dysphoric mood. The patient is not nervous/anxious.        Objective:   Physical Exam  Vitals:   09/25/17 1130  BP: 126/72  Pulse: 85  SpO2: 97%  Weight: 137 lb (62.1 kg)  Height: 6' 1.5" (1.867 m)    Estimated body mass index is 17.83 kg/m as calculated from the following:   Height as of this encounter: 6' 1.5" (1.867 m).   Weight as of this encounter: 137 lb (62.1 kg).       Assessment & Plan:     ICD-10-CM   1. Nodule of apex of right lung R91.1   2. Multiple lung nodules on CT R91.8   3. Smoking history Z87.891   4. History of COPD Z87.09      Unclear if nodule right upper lobe is cancer or not IT has been there since 2016 atleast and is slowly growing Biopsy has limitations - such as  non-diagnosis Respect you find surgery too aggressive at this point So overall we agreed to continue to monitor this but monitor it a bit more aggressively and if continued growth then intervene  PLAN Do sUper D CT chest without contrast  In 3-4 months Do PFT in 3-4 mnonths  Followup  return to see DR [name] at that time; he is our nodule expert    Dr. Brand Males, M.D., [facility].C.P Pulmonary and Critical Care Medicine Staff Physician, [place] Director - Interstitial Lung Disease  Program  Pulmonary [place] at [place], [place], [address]  Pager: [phone], If no answer or between  15:00h - 7:00h: call [phone] Telephone: [phone]

## 2017-09-25 NOTE — Patient Instructions (Addendum)
ICD-10-CM   1. Nodule of apex of right lung R91.1   2. Multiple lung nodules on CT R91.8   3. Smoking history Z87.891   4. History of COPD Z87.09     Unclear if nodule right upper lobe is cancer or not IT has been there since 2016 atleast and is slowly growing Biopsy has limitations - such as non-diagnosis Respect you find surgery too aggressive at this point So overall we agreed to continue to monitor this but monitor it a bit more aggressively and if continued growth then intervene  PLAN Do sUper D CT chest without contrast  In 3-4 months Do PFT in 3-4 mnonths  Followup  return to see DR [name] at that time; he is our nodule expert

## 2017-10-24 ENCOUNTER — Ambulatory Visit (INDEPENDENT_AMBULATORY_CARE_PROVIDER_SITE_OTHER): Payer: Medicaid Other | Admitting: Emergency Medicine

## 2017-10-24 ENCOUNTER — Encounter: Payer: Self-pay | Admitting: Emergency Medicine

## 2017-10-24 ENCOUNTER — Other Ambulatory Visit: Payer: Medicaid Other

## 2017-10-24 ENCOUNTER — Ambulatory Visit (INDEPENDENT_AMBULATORY_CARE_PROVIDER_SITE_OTHER): Payer: Medicaid Other | Admitting: Internal Medicine

## 2017-10-24 VITALS — BP 102/72 | HR 78 | Ht 73.0 in | Wt 133.0 lb

## 2017-10-24 DIAGNOSIS — Z8709 Personal history of other diseases of the respiratory system: Secondary | ICD-10-CM

## 2017-10-24 DIAGNOSIS — J449 Chronic obstructive pulmonary disease, unspecified: Secondary | ICD-10-CM | POA: Diagnosis not present

## 2017-10-24 DIAGNOSIS — Z201 Contact with and (suspected) exposure to tuberculosis: Secondary | ICD-10-CM

## 2017-10-24 DIAGNOSIS — Z720 Tobacco use: Secondary | ICD-10-CM | POA: Diagnosis not present

## 2017-10-24 DIAGNOSIS — R918 Other nonspecific abnormal finding of lung field: Secondary | ICD-10-CM | POA: Diagnosis not present

## 2017-10-24 DIAGNOSIS — R911 Solitary pulmonary nodule: Secondary | ICD-10-CM

## 2017-10-24 LAB — PULMONARY FUNCTION TEST
DL/VA % pred: 71 %
DL/VA: 3.42 ml/min/mmHg/L
DLCO cor % pred: 53 %
DLCO cor: 19.46 ml/min/mmHg
DLCO unc % pred: 50 %
DLCO unc: 18.4 ml/min/mmHg
FEF 25-75 Post: 2.01 L/sec
FEF 25-75 Pre: 1.93 L/sec
FEF2575-%Change-Post: 4 %
FEF2575-%Pred-Post: 62 %
FEF2575-%Pred-Pre: 59 %
FEV1-%Change-Post: 2 %
FEV1-%Pred-Post: 84 %
FEV1-%Pred-Pre: 83 %
FEV1-Post: 2.98 L
FEV1-Pre: 2.92 L
FEV1FVC-%Change-Post: 2 %
FEV1FVC-%Pred-Pre: 89 %
FEV6-%Change-Post: 0 %
FEV6-%Pred-Post: 95 %
FEV6-%Pred-Pre: 96 %
FEV6-Post: 4.16 L
FEV6-Pre: 4.19 L
FEV6FVC-%Change-Post: 0 %
FEV6FVC-%Pred-Post: 102 %
FEV6FVC-%Pred-Pre: 102 %
FVC-%Change-Post: 0 %
FVC-%Pred-Post: 93 %
FVC-%Pred-Pre: 93 %
FVC-Post: 4.19 L
FVC-Pre: 4.2 L
Post FEV1/FVC ratio: 71 %
Post FEV6/FVC ratio: 99 %
Pre FEV1/FVC ratio: 70 %
Pre FEV6/FVC Ratio: 100 %
RV % pred: 93 %
RV: 2.25 L
TLC % pred: 81 %
TLC: 6.18 L

## 2017-10-24 NOTE — Patient Instructions (Addendum)
We will perform blood work today to rule out tuberculosis since you were exposed to this many years ago. We will plan to repeat your CT scan of the chest in May 2019 to compare with your priors. Stop Symbicort altogether. Keep albuterol available to use 2 puffs if needed for shortness of breath, wheezing, chest tightness. Follow-up in May 2019 after your CT scan to review the results and to decide next steps to evaluate your right upper lobe pulmonary nodule.

## 2017-10-24 NOTE — Assessment & Plan Note (Signed)
Continues to smoke.  Discussed with him today.  He understands the importance of cessation is going to work on cutting down.  We can talk about a quit date at his next visit.

## 2017-10-24 NOTE — Assessment & Plan Note (Signed)
Dominant nodule is a slowly enlarging irregularly-shaped right upper lobe nodule with both groundglass and solid components.  The interval growth has been over about 1 year.  No significant hypermetabolism on PET scan from January 2019.  He does have a TB exposure and he needs to be evaluated with QuantiFERON gold.  We will plan to repeat his CT scan of the chest, super D cuts in May 2019.  Depending on the behavior of the nodule we will decide whether he needs navigational bronchoscopy for biopsy.

## 2017-10-24 NOTE — Assessment & Plan Note (Signed)
Mild COPD with mild obstruction documented on pulmonary function testing today.  He has not been taking Symbicort regularly.  He does have albuterol available and uses it about 3-4 times a week.  He has had a clinical improvement since he cut down on his smoking.  Continues to smoke about half a pack a day.  Explained to him that if his albuterol use increases then we could go back to a scheduled bronchodilator.  For now we will hold off.  Most important thing is smoking cessation and we discussed this in detail today.

## 2017-10-24 NOTE — Progress Notes (Signed)
Subjective:    Patient ID: ELMO RIO, male    DOB: 02/09/1956, 62 y.o.   MRN: [id number]  HPI 62 year old active smoker (25 pack years) with a history of tonsillar cancer with recurrence that has been treated by Dr Alvy Bimler and with XRT.  As part of his serial imaging and evaluation for his tonsillar cancel a groundglass apical right upper lobe nodule was identified dating back to 2016.  He is referred today for further evaluation of the pulmonary.  I reviewed all of his imaging including serial CT scans and ultimately a PET scan done on 09/17/17.  The mixed attenuation nodule now measures 1.8 cm in its largest dimension with some extremely mild hypermetabolism (SUV 2.0).  This is slightly larger than his imaging from 1 year ago.  Her reports that he feels well, does had some pain at his prior port site. He is not having any current dyspnea. He has symbicort but is no longer using every day. He has albuterol that he uses about 4x a week. He denies any cough. No wheeze. He denies any fevers. He did lose weight when he was treated for his CA.   He underwent pulmonary function testing today that I have reviewed.  This shows evidence for mild obstruction, FEV1 2.92 or 83% predicted.  No significant bronchodilator response.  Normal lung volumes.  Decreased diffusion capacity that does not completely correct when adjusted for his alveolar volume.  Review of Systems  Constitutional: Negative for fever and unexpected weight change.  HENT: Negative for congestion, dental problem, ear pain, nosebleeds, postnasal drip, rhinorrhea, sinus pressure, sneezing, sore throat and trouble swallowing.   Eyes: Negative for redness and itching.  Respiratory: Negative for cough, chest tightness, shortness of breath and wheezing.   Cardiovascular: Negative for palpitations and leg swelling.  Gastrointestinal: Negative for nausea and vomiting.  Genitourinary: Negative for dysuria.  Musculoskeletal: Negative for joint  swelling.  Skin: Negative for rash.  Neurological: Negative for headaches.  Hematological: Does not bruise/bleed easily.  Psychiatric/Behavioral: Negative for dysphoric mood. The patient is not nervous/anxious.     Past Medical History:  Diagnosis Date  . Anxiety   . Cancer ([place]) 04/04/2016   cancer of left tonsil  . CKD (chronic kidney disease), stage III ([place])   . COPD (chronic obstructive pulmonary disease) ([place])   . Hepatitis   . Hepatitis C    Treatment x 8 weeks 6-7 months ago [facility]  . History of radiation therapy 05/23/16- 07/10/16   Left Tonsil and bilateral neck  . Sinus congestion   . Stomach ulcer      Family History  Problem Relation Age of Onset  . Hypertension Mother   . Anxiety disorder Mother   . Diabetes Mother   . Cancer Father      Social History   Socioeconomic History  . Marital status: Single    Spouse name: Not on file  . Number of children: 3  . Years of education: Not on file  . Highest education level: Not on file  Social Needs  . Financial resource strain: Not on file  . Food insecurity - worry: Not on file  . Food insecurity - inability: Not on file  . Transportation needs - medical: Not on file  . Transportation needs - non-medical: Not on file  Occupational History  . Occupation: disabled  Tobacco Use  . Smoking status: Current Some Day Smoker    Packs/day: 0.50    Years: 30.00  Pack years: 15.00    Types: Cigarettes  . Smokeless tobacco: Never Used  . Tobacco comment: states smoking 2 cigs per day as of 09/25/17  ep  Substance and Sexual Activity  . Alcohol use: Yes    Comment: QOD  . Drug use: No  . Sexual activity: Yes  Other Topics Concern  . Not on file  Social History Narrative   Lives with mother.    Mother is disabled with mental illness. He is the principal care giver   Has 3 children.   [name] is an important contact   He does not drive. Takes a bus. Ride a bike.   He was exposed to TB 20 yrs ago -  his cousin.  He has lived in [facility] No [employer]  Has worked Architect. No welding or woodworking, no asbestos.   No Known Allergies   Outpatient Medications Prior to Visit  Medication Sig Dispense Refill  . aspirin EC 81 MG tablet Take 81 mg by mouth daily.    . Budesonide-Formoterol Fumarate (SYMBICORT IN) Inhale 1 puff into the lungs 2 (two) times daily.     . fluticasone (FLONASE) 50 MCG/ACT nasal spray Place 1-2 sprays into both nostrils daily as needed for allergies or rhinitis.    Marland Kitchen PROAIR HFA 108 (90 Base) MCG/ACT inhaler inhale 1 puff by mouth every 6 hours if needed  0  . Travoprost, BAK Free, (TRAVATAN Z) 0.004 % SOLN ophthalmic solution Place 1 drop into both eyes at bedtime.     Facility-Administered Medications Prior to Visit  Medication Dose Route Frequency Provider Last Rate Last Dose  . dexamethasone (DECADRON) injection 10 mg  10 mg Intravenous Once Alvy Bimler, Ni, MD      . LORazepam (ATIVAN) injection 0.5 mg  0.5 mg Intravenous Once Gorsuch, Ni, MD      . sodium chloride flush (NS) 0.9 % injection 10 mL  10 mL Intracatheter PRN Alvy Bimler, Ni, MD   10 mL at 05/24/16 1615        Objective:   Physical Exam Vitals:   10/24/17 1553 10/24/17 1555  BP:  102/72  Pulse:  78  SpO2:  97%  Weight: 133 lb (60.3 kg)   Height: 6\' 1"  (1.854 m)    Gen: Pleasant, thin gentleman, in no distress,  normal affect  ENT: No lesions,  mouth clear,  oropharynx clear, no postnasal drip  Neck: No JVD, no stridor  Lungs: No use of accessory muscles, clear bilaterally, no wheezing, no crackles  Cardiovascular: RRR, heart sounds normal, no murmur or gallops, no peripheral edema  Musculoskeletal: No deformities, no cyanosis or clubbing  Neuro: alert, non focal  Skin: Warm, no lesions or rash     Assessment & Plan:  COPD GOLD II Mild COPD with mild obstruction documented on pulmonary function testing today.  He has not been taking Symbicort regularly.  He does have albuterol  available and uses it about 3-4 times a week.  He has had a clinical improvement since he cut down on his smoking.  Continues to smoke about half a pack a day.  Explained to him that if his albuterol use increases then we could go back to a scheduled bronchodilator.  For now we will hold off.  Most important thing is smoking cessation and we discussed this in detail today.  Multiple lung nodules on CT Dominant nodule is a slowly enlarging irregularly-shaped right upper lobe nodule with both groundglass and solid components.  The interval growth  has been over about 1 year.  No significant hypermetabolism on PET scan from January 2019.  He does have a TB exposure and he needs to be evaluated with QuantiFERON gold.  We will plan to repeat his CT scan of the chest, super D cuts in May 2019.  Depending on the behavior of the nodule we will decide whether he needs navigational bronchoscopy for biopsy.   Tobacco abuse Continues to smoke.  Discussed with him today.  He understands the importance of cessation is going to work on cutting down.  We can talk about a quit date at his next visit.  Baltazar Apo, MD, PhD 10/24/2017, 4:30 PM [facility] [phone] or if no answer [phone]

## 2017-10-24 NOTE — Progress Notes (Signed)
PFT done today. 

## 2017-10-26 LAB — QUANTIFERON-TB GOLD PLUS
Mitogen-NIL: >10 IU/mL
NIL: 0.2 IU/mL
QuantiFERON-TB Gold Plus: NEGATIVE
TB1-NIL: 0 IU/mL
TB2-NIL: 0.02 [IU]/mL

## 2017-10-31 ENCOUNTER — Emergency Department (HOSPITAL_COMMUNITY): Payer: Medicaid Other

## 2017-10-31 ENCOUNTER — Other Ambulatory Visit: Payer: Self-pay

## 2017-10-31 ENCOUNTER — Encounter (HOSPITAL_COMMUNITY): Payer: Self-pay | Admitting: Emergency Medicine

## 2017-10-31 DIAGNOSIS — R0602 Shortness of breath: Secondary | ICD-10-CM | POA: Diagnosis present

## 2017-10-31 DIAGNOSIS — F17210 Nicotine dependence, cigarettes, uncomplicated: Secondary | ICD-10-CM | POA: Diagnosis not present

## 2017-10-31 DIAGNOSIS — Z85818 Personal history of malignant neoplasm of other sites of lip, oral cavity, and pharynx: Secondary | ICD-10-CM | POA: Diagnosis not present

## 2017-10-31 DIAGNOSIS — Z7982 Long term (current) use of aspirin: Secondary | ICD-10-CM | POA: Insufficient documentation

## 2017-10-31 DIAGNOSIS — N183 Chronic kidney disease, stage 3 (moderate): Secondary | ICD-10-CM | POA: Diagnosis not present

## 2017-10-31 DIAGNOSIS — R05 Cough: Secondary | ICD-10-CM | POA: Diagnosis not present

## 2017-10-31 DIAGNOSIS — J449 Chronic obstructive pulmonary disease, unspecified: Secondary | ICD-10-CM | POA: Diagnosis not present

## 2017-10-31 LAB — CBC WITH DIFFERENTIAL/PLATELET
BASOS ABS: 0 10*3/uL (ref 0.0–0.1)
Basophils Relative: 0 %
Eosinophils Absolute: 0.1 10*3/uL (ref 0.0–0.7)
Eosinophils Relative: 3 %
HEMATOCRIT: 36.9 % — AB (ref 39.0–52.0)
HEMOGLOBIN: 13 g/dL (ref 13.0–17.0)
LYMPHS PCT: 25 %
Lymphs Abs: 1.3 10*3/uL (ref 0.7–4.0)
MCH: 33.9 pg (ref 26.0–34.0)
MCHC: 35.2 g/dL (ref 30.0–36.0)
MCV: 96.1 fL (ref 78.0–100.0)
MONOS PCT: 12 %
Monocytes Absolute: 0.6 10*3/uL (ref 0.1–1.0)
NEUTROS ABS: 3 10*3/uL (ref 1.7–7.7)
NEUTROS PCT: 60 %
Platelets: 175 10*3/uL (ref 150–400)
RBC: 3.84 MIL/uL — ABNORMAL LOW (ref 4.22–5.81)
RDW: 13.5 % (ref 11.5–15.5)
WBC: 5 10*3/uL (ref 4.0–10.5)

## 2017-10-31 LAB — BASIC METABOLIC PANEL
ANION GAP: 12 (ref 5–15)
BUN: 14 mg/dL (ref 6–20)
CHLORIDE: 101 mmol/L (ref 101–111)
CO2: 23 mmol/L (ref 22–32)
Calcium: 9.2 mg/dL (ref 8.9–10.3)
Creatinine, Ser: 1.38 mg/dL — ABNORMAL HIGH (ref 0.61–1.24)
GFR calc non Af Amer: 54 mL/min — ABNORMAL LOW (ref >60)
Glucose, Bld: 87 mg/dL (ref 65–99)
Potassium: 4 mmol/L (ref 3.5–5.1)
Sodium: 136 mmol/L (ref 135–145)

## 2017-10-31 NOTE — ED Notes (Signed)
Results reviewed by RN, no changes in acuity. Will continue to monitor.

## 2017-10-31 NOTE — ED Triage Notes (Signed)
Pt c/o SOB, fever and chills, denies any pain, no nausea or vomiting.

## 2017-11-01 ENCOUNTER — Emergency Department (HOSPITAL_COMMUNITY)
Admission: EM | Admit: 2017-11-01 | Discharge: 2017-11-01 | Disposition: A | Discharge status: Home / Self Care | Payer: Medicaid Other | Attending: Emergency Medicine | Admitting: Emergency Medicine

## 2017-11-01 DIAGNOSIS — R0602 Shortness of breath: Secondary | ICD-10-CM

## 2017-11-01 LAB — TROPONIN I: Troponin I: <0.03 ng/mL (ref <0.03)

## 2017-11-01 NOTE — ED Provider Notes (Signed)
[facility] EMERGENCY DEPARTMENT Provider Note   CSN: [id number] Arrival date & time: 10/31/17  2054     History   Chief Complaint Chief Complaint  Patient presents with  . Shortness of Breath    HPI [name] [name] is a 62 y.o. male.  The history is provided by the patient.  Shortness of Breath  This is a new problem. The problem occurs frequently.The current episode started more than 2 days ago. The problem has been gradually worsening. Associated symptoms include cough. Pertinent negatives include no fever, no hemoptysis, no chest pain, no vomiting and no leg swelling. He has tried nothing for the symptoms. Associated medical issues include COPD.  With history of COPD, history of lung nodules presents with shortness of breath for the past 3 days.  He reports chills and mild cough.  No active chest pain.  No fever, no myalgias.  No new sore throat.  He was concerned he may have pneumonia.  Past Medical History:  Diagnosis Date  . Anxiety   . Cancer ([place]) 04/04/2016   cancer of left tonsil  . CKD (chronic kidney disease), stage III ([place])   . COPD (chronic obstructive pulmonary disease) ([place])   . Hepatitis   . Hepatitis C    Treatment x 8 weeks 6-7 months ago Harvoni  . History of radiation therapy 05/23/16- 07/10/16   Left Tonsil and bilateral neck  . Sinus congestion   . Stomach ulcer     Patient Active Problem List   Diagnosis Date Noted  . Other fatigue 09/17/2017  . Neck stiffness 08/03/2017  . Multiple lung nodules on CT 12/05/2016  . 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 Multifocal pneumonia 10/04/2016  . Chronic kidney disease, stage III (moderate) ([place]) 09/14/2016  . Dry mouth 09/14/2016  . Dysgeusia 09/05/2016  . Dehydration 09/01/2016  . Bronchitis 09/01/2016  . Diarrhea 09/01/2016  . Anemia due to chronic illness 08/24/2016  . Acute prerenal failure ([place]) 07/24/2016  . Hemoptysis 07/24/2016  . Pancytopenia,  acquired ([place]) 07/04/2016  . Radiation dermatitis 07/04/2016  . Atypical chest pain 06/27/2016  . Bilateral tinnitus 05/30/2016  . Chemotherapy-induced nausea 05/30/2016  . Constipation, acute 05/30/2016  . Hypersensitivity reaction 05/23/2016  . Cancer associated pain 05/18/2016  . Lesion of right lung 04/26/2016  . 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 History of hepatitis C 04/24/2016  . Chest pain 09/17/2014  . Anxiety   . Dyspnea 03/25/2014  . COPD GOLD II 07/25/2013  . Sinusitis, chronic 07/25/2013  . Tobacco abuse 07/25/2013    Past Surgical History:  Procedure Laterality Date  . COLONOSCOPY WITH PROPOFOL N/A 04/14/2015   Procedure: COLONOSCOPY WITH PROPOFOL;  Surgeon: Arta Silence, MD;  Location: [facility];  Service: Endoscopy;  Laterality: N/A;  . HERNIA REPAIR    . IR GENERIC HISTORICAL  05/17/2016   IR FLUORO GUIDE PORT INSERTION RIGHT 05/17/2016 Jacqulynn Cadet, MD [facility]  . IR GENERIC HISTORICAL  05/17/2016   IR US GUIDE VASC ACCESS RIGHT 05/17/2016 Jacqulynn Cadet, MD [facility]  . IR GENERIC HISTORICAL  05/17/2016   IR GASTROSTOMY TUBE MOD SED 05/17/2016 Jacqulynn Cadet, MD [facility]  . IR GENERIC HISTORICAL  11/13/2016   IR GASTROSTOMY TUBE REMOVAL 11/13/2016 Greggory Keen, MD [facility]  . IR GENERIC HISTORICAL  11/13/2016   IR REMOVAL TUN ACCESS W/ PORT W/O FL MOD SED 11/13/2016 Greggory Keen, MD [facility]  . MULTIPLE EXTRACTIONS WITH ALVEOLOPLASTY N/A  05/03/2016   Procedure: Multiple extractions with alveoloplasty and gross debridement of remaining teeth.;  Surgeon: Lenn Cal, DDS;  Location: [facility];  Service: Oral Surgery;  Laterality: N/A;  . STOMACH SURGERY     20 years       Home Medications    Prior to Admission medications   Medication Sig Start Date End Date Taking? Authorizing Provider  aspirin EC 81 MG tablet Take 81 mg by mouth daily.   Yes [provider]  PROAIR HFA 108 [phone] Base)  MCG/ACT inhaler inhale 1 puff by mouth every 6 hours if needed for SOB 07/04/17  Yes [provider]    Family History Family History  Problem Relation Age of Onset  . Hypertension Mother   . Anxiety disorder Mother   . Diabetes Mother   . Cancer Father     Social History Social History   Tobacco Use  . Smoking status: Current Some Day Smoker    Packs/day: 0.50    Years: 30.00    Pack years: 15.00    Types: Cigarettes  . Smokeless tobacco: Never Used  . Tobacco comment: states smoking 2 cigs per day as of 09/25/17  ep  Substance Use Topics  . Alcohol use: Yes    Comment: QOD  . Drug use: No     Allergies   Patient has no known allergies.   Review of Systems Review of Systems  Constitutional: Negative for fever.  Respiratory: Positive for cough and shortness of breath. Negative for hemoptysis.   Cardiovascular: Negative for chest pain and leg swelling.  Gastrointestinal: Negative for vomiting.  All other systems reviewed and are negative.    Physical Exam Updated Vital Signs BP 131/87   Pulse 62   Temp 98.3 F (36.8 C) (Oral)   Resp 16   Ht 1.854 m (6\' 1" )   Wt 60.3 kg (133 lb)   SpO2 98%   BMI 17.55 kg/m   Physical Exam CONSTITUTIONAL: Well developed/well nourished HEAD: Normocephalic/atraumatic EYES: EOMI/PERRL ENMT: Mucous membranes moist, no stridor, uvula midline, minimal erythema, no edema NECK: supple no meningeal signs SPINE/BACK:entire spine nontender CV: S1/S2 noted, no murmurs/rubs/gallops noted LUNGS: Lungs are clear to auscultation bilaterally, no apparent distress ABDOMEN: soft, nontender, no rebound or guarding, bowel sounds noted throughout abdomen GU:no cva tenderness NEURO: Pt is awake/alert/appropriate, moves all extremitiesx4.  No facial droop.   EXTREMITIES: pulses normal/equal, full ROM SKIN: warm, color normal PSYCH: no abnormalities of mood noted, alert and oriented to situation   ED Treatments / Results   Labs (all labs ordered are listed, but only abnormal results are displayed) Labs Reviewed  CBC WITH DIFFERENTIAL/PLATELET - Abnormal; Notable for the following components:      Result Value   RBC 3.84 (*)    HCT 36.9 (*)    All other components within normal limits  BASIC METABOLIC PANEL - Abnormal; Notable for the following components:   Creatinine, Ser 1.38 (*)    GFR calc non Af Amer 54 (*)    All other components within normal limits  TROPONIN I    EKG  EKG Interpretation  Date/Time:  Wednesday October 31 2017 21:09:16 EST Ventricular Rate:  81 PR Interval:  142 QRS Duration: 94 QT Interval:  366 QTC Calculation: 425 R Axis:   106 Text Interpretation:  Normal sinus rhythm Right atrial enlargement Rightward axis Pulmonary disease pattern Incomplete right bundle branch block Abnormal ECG When compared with ECG of 06/15/2017, No significant change was  found Confirmed by Delora Fuel ([id number]) on 10/31/2017 10:59:59 PM       Radiology Dg Chest 2 View  Result Date: 10/31/2017 CLINICAL DATA:  62 year old male with shortness of breath and fever. Head and neck cancer. EXAM: CHEST  2 VIEW COMPARISON:  PET-CT dated 09/17/2017 FINDINGS: There are biapical subpleural thickening/scarring. A 10 mm nodular density in the right upper lobe superimposed on the third rib likely corresponds to the nodule seen prior PET. There is no focal consolidation, pleural effusion, or pneumothorax. The cardiac silhouette is within normal limits. No acute osseous pathology. IMPRESSION: No active cardiopulmonary disease. Electronically Signed   By: Anner Crete M.D.   On: 10/31/2017 21:27    Procedures Procedures (including critical care time)  Medications Ordered in ED Medications - No data to display   Initial Impression / Assessment and Plan / ED Course  I have reviewed the triage vital signs and the nursing notes.  Pertinent labs & imaging results that were available during my care of the  patient were reviewed by me and considered in my medical decision making (see chart for details).     Stable.  Labs reassuring.  She is reviewed and negative.  No Tachypnea or hypoxia on ambulation Patient thought he had pneumonia, but no signs of this on x-ray.  He will follow-up as an outpatient, he has no lung nodules which are being managed as an outpatient  Final Clinical Impressions(s) / ED Diagnoses   Final diagnoses:  Shortness of breath    ED Discharge Orders    None       Ripley Fraise, MD 11/01/17 [phone]

## 2017-11-01 NOTE — ED Notes (Signed)
Pt verbalizes understanding of d/c instructions. Pt ambulatory at d/c with all belongings.   

## 2017-11-01 NOTE — ED Notes (Signed)
Pt ambulatory from lobby to room without difficulty. Pt reports minimal sob.

## 2017-11-01 NOTE — ED Notes (Signed)
ED Provider at bedside. 

## 2017-11-01 NOTE — ED Notes (Signed)
Pt. Was able to ambulate throughout the hallways and maintained a pulse ox of 99-100 the entire time. No SOB, dizziness, or discomfort stated during walk.

## 2018-01-14 ENCOUNTER — Encounter (HOSPITAL_COMMUNITY): Payer: Self-pay

## 2018-01-14 ENCOUNTER — Other Ambulatory Visit: Payer: Self-pay

## 2018-01-14 ENCOUNTER — Emergency Department (HOSPITAL_COMMUNITY)
Admission: EM | Admit: 2018-01-14 | Discharge: 2018-01-14 | Disposition: A | Discharge status: Home / Self Care | Payer: Medicaid Other | Attending: Emergency Medicine | Admitting: Emergency Medicine

## 2018-01-14 ENCOUNTER — Emergency Department (HOSPITAL_COMMUNITY): Payer: Medicaid Other

## 2018-01-14 DIAGNOSIS — Z7982 Long term (current) use of aspirin: Secondary | ICD-10-CM | POA: Insufficient documentation

## 2018-01-14 DIAGNOSIS — F17210 Nicotine dependence, cigarettes, uncomplicated: Secondary | ICD-10-CM | POA: Insufficient documentation

## 2018-01-14 DIAGNOSIS — N183 Chronic kidney disease, stage 3 (moderate): Secondary | ICD-10-CM | POA: Diagnosis not present

## 2018-01-14 DIAGNOSIS — J449 Chronic obstructive pulmonary disease, unspecified: Secondary | ICD-10-CM

## 2018-01-14 DIAGNOSIS — Z79899 Other long term (current) drug therapy: Secondary | ICD-10-CM | POA: Diagnosis not present

## 2018-01-14 DIAGNOSIS — R0600 Dyspnea, unspecified: Secondary | ICD-10-CM

## 2018-01-14 DIAGNOSIS — R0602 Shortness of breath: Secondary | ICD-10-CM | POA: Diagnosis present

## 2018-01-14 LAB — BASIC METABOLIC PANEL
Anion gap: 11 (ref 5–15)
BUN: 13 mg/dL (ref 6–20)
CHLORIDE: 99 mmol/L — AB (ref 101–111)
CO2: 23 mmol/L (ref 22–32)
CREATININE: 1.42 mg/dL — AB (ref 0.61–1.24)
Calcium: 9.9 mg/dL (ref 8.9–10.3)
GFR calc Af Amer: >60 mL/min (ref >60)
GFR calc non Af Amer: 53 mL/min — ABNORMAL LOW (ref >60)
Glucose, Bld: 95 mg/dL (ref 65–99)
POTASSIUM: 4.2 mmol/L (ref 3.5–5.1)
SODIUM: 133 mmol/L — AB (ref 135–145)

## 2018-01-14 LAB — I-STAT TROPONIN, ED: Troponin i, poc: 0 ng/mL (ref 0.00–0.08)

## 2018-01-14 LAB — CBC
HCT: 37.4 % — ABNORMAL LOW (ref 39.0–52.0)
Hemoglobin: 12.9 g/dL — ABNORMAL LOW (ref 13.0–17.0)
MCH: 32.6 pg (ref 26.0–34.0)
MCHC: 34.5 g/dL (ref 30.0–36.0)
MCV: 94.4 fL (ref 78.0–100.0)
PLATELETS: 171 10*3/uL (ref 150–400)
RBC: 3.96 MIL/uL — AB (ref 4.22–5.81)
RDW: 13.5 % (ref 11.5–15.5)
WBC: 5.1 10*3/uL (ref 4.0–10.5)

## 2018-01-14 MED ORDER — PANTOPRAZOLE SODIUM 40 MG PO TBEC: 40.0000 mg | DELAYED_RELEASE_TABLET | Freq: Every day | ORAL | 0 refills | Status: DC

## 2018-01-14 MED ORDER — FAMOTIDINE 20 MG PO TABS
20.0000 mg | ORAL_TABLET | Freq: Once | ORAL | Status: AC
  Administered 2018-01-14: 20 mg via ORAL
  Filled 2018-01-14: qty 1

## 2018-01-14 MED ORDER — GI COCKTAIL ~~LOC~~
30.0000 mL | Freq: Once | ORAL | Status: AC
  Administered 2018-01-14: 30 mL via ORAL
  Filled 2018-01-14: qty 30

## 2018-01-14 MED ORDER — ALBUTEROL SULFATE (2.5 MG/3ML) 0.083% IN NEBU
5.0000 mg | INHALATION_SOLUTION | Freq: Once | RESPIRATORY_TRACT | Status: AC
  Administered 2018-01-14: 5 mg via RESPIRATORY_TRACT
  Filled 2018-01-14: qty 6

## 2018-01-14 MED ORDER — ALBUTEROL SULFATE HFA 108 (90 BASE) MCG/ACT IN AERS: 2.0000 | INHALATION_SPRAY | RESPIRATORY_TRACT | 1 refills | Status: DC | PRN

## 2018-01-14 NOTE — ED Triage Notes (Signed)
Pt reports intermittent SOB that started several days ago but worse yesterday. Pt states he has decreased appetite and also feels some chest tightness.

## 2018-01-14 NOTE — ED Provider Notes (Signed)
[facility] EMERGENCY DEPARTMENT Provider Note   CSN: [id number] Arrival date & time: 01/14/18  0846     History   Chief Complaint Chief Complaint  Patient presents with  . Shortness of Breath    HPI [name] [name] is a 62 y.o. male.  Patient w hx copd, head/neck cancer 'in remission' per pt, c/o feeling sob in past 1-2 days. Symptoms mild, persistent, come and go, at rest, no relation to activity or exertion.  States at times is feeling of a swollen area in throat that he needs to clear, and at other times feels wheezy. Notes was told possible reflux in past, takes gi meds prn. Denies regular mdi. Smoker/former smoker. States recent eval by ent indicated head/neck cancer was gone, no recurrence noted, however they have noted a lung nodule for which he has appt with pulmonary in 1 week. Denies any chest pain or discomfort. No leg pain or swelling. No hx dvt or pe. +recent few lb weight loss.    The history is provided by the patient.  Shortness of Breath  Associated symptoms include cough. Pertinent negatives include no fever, no headaches, no sore throat, no neck pain, no chest pain, no vomiting, no abdominal pain and no rash.    Past Medical History:  Diagnosis Date  . Anxiety   . Cancer ([place]) 04/04/2016   cancer of left tonsil  . CKD (chronic kidney disease), stage III ([place])   . COPD (chronic obstructive pulmonary disease) ([place])   . Hepatitis   . Hepatitis C    Treatment x 8 weeks 6-7 months ago Harvoni  . History of radiation therapy 05/23/16- 07/10/16   Left Tonsil and bilateral neck  . Sinus congestion   . Stomach ulcer     Patient Active Problem List   Diagnosis Date Noted  . Other fatigue 09/17/2017  . Neck stiffness 08/03/2017  . Multiple lung nodules on CT 12/05/2016  . 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 Multifocal pneumonia 10/04/2016  . Chronic kidney disease, stage III (moderate) ([place]) 09/14/2016  . Dry mouth  09/14/2016  . Dysgeusia 09/05/2016  . Dehydration 09/01/2016  . Bronchitis 09/01/2016  . Diarrhea 09/01/2016  . Anemia due to chronic illness 08/24/2016  . Acute prerenal failure ([place]) 07/24/2016  . Hemoptysis 07/24/2016  . Pancytopenia, acquired ([place]) 07/04/2016  . Radiation dermatitis 07/04/2016  . Atypical chest pain 06/27/2016  . Bilateral tinnitus 05/30/2016  . Chemotherapy-induced nausea 05/30/2016  . Constipation, acute 05/30/2016  . Hypersensitivity reaction 05/23/2016  . Cancer associated pain 05/18/2016  . Lesion of right lung 04/26/2016  . 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 History of hepatitis C 04/24/2016  . Chest pain 09/17/2014  . Anxiety   . Dyspnea 03/25/2014  . COPD GOLD II 07/25/2013  . Sinusitis, chronic 07/25/2013  . Tobacco abuse 07/25/2013    Past Surgical History:  Procedure Laterality Date  . COLONOSCOPY WITH PROPOFOL N/A 04/14/2015   Procedure: COLONOSCOPY WITH PROPOFOL;  Surgeon: Arta Silence, MD;  [facility];  Service: Endoscopy;  Laterality: N/A;  . HERNIA REPAIR    . IR GENERIC HISTORICAL  05/17/2016   IR FLUORO GUIDE PORT INSERTION RIGHT 05/17/2016 Jacqulynn Cadet, MD [facility]  . IR GENERIC HISTORICAL  05/17/2016   IR US GUIDE VASC ACCESS RIGHT 05/17/2016 Jacqulynn Cadet, MD [facility]  . IR GENERIC HISTORICAL  05/17/2016   IR GASTROSTOMY TUBE MOD SED 05/17/2016 Jacqulynn Cadet, MD [facility]  .  IR GENERIC HISTORICAL  11/13/2016   IR GASTROSTOMY TUBE REMOVAL 11/13/2016 Greggory Keen, MD [facility]  . IR GENERIC HISTORICAL  11/13/2016   IR REMOVAL TUN ACCESS W/ PORT W/O FL MOD SED 11/13/2016 Greggory Keen, MD [facility]  . MULTIPLE EXTRACTIONS WITH ALVEOLOPLASTY N/A 05/03/2016   Procedure: Multiple extractions with alveoloplasty and gross debridement of remaining teeth.;  Surgeon: Lenn Cal, DDS;  Location: [facility];  Service: Oral Surgery;  Laterality: N/A;  . STOMACH SURGERY     20 years         Home Medications    Prior to Admission medications   Medication Sig Start Date End Date Taking? Authorizing Provider  aspirin EC 81 MG tablet Take 81 mg by mouth daily.    [provider]  PROAIR HFA 108 [phone] Base) MCG/ACT inhaler inhale 1 puff by mouth every 6 hours if needed for SOB 07/04/17   [provider]    Family History Family History  Problem Relation Age of Onset  . Hypertension Mother   . Anxiety disorder Mother   . Diabetes Mother   . Cancer Father     Social History Social History   Tobacco Use  . Smoking status: Current Some Day Smoker    Packs/day: 0.50    Years: 30.00    Pack years: 15.00    Types: Cigarettes  . Smokeless tobacco: Never Used  . Tobacco comment: states smoking 2 cigs per day as of 09/25/17  ep  Substance Use Topics  . Alcohol use: Yes    Comment: QOD  . Drug use: No    Types: Cocaine     Allergies   Patient has no known allergies.   Review of Systems Review of Systems  Constitutional: Negative for fever.  HENT: Negative for sore throat.   Eyes: Negative for redness.  Respiratory: Positive for cough and shortness of breath.        Occasional non prod cough.   Cardiovascular: Negative for chest pain.  Gastrointestinal: Negative for abdominal pain, diarrhea and vomiting.  Genitourinary: Negative for flank pain.  Musculoskeletal: Negative for back pain and neck pain.  Skin: Negative for rash.  Neurological: Negative for headaches.  Hematological: Does not bruise/bleed easily.  Psychiatric/Behavioral: Negative for confusion.     Physical Exam Updated Vital Signs BP (!) 155/98 (BP Location: Right Arm)   Pulse 74   Temp 98.4 F (36.9 C) (Oral)   Resp 20   SpO2 100%   Physical Exam  Constitutional: He appears well-developed and well-nourished. No distress.  HENT:  Mouth/Throat: Oropharynx is clear and moist.  Eyes: Pupils are equal, round, and reactive to light. Conjunctivae and EOM are  normal.  Neck: Normal range of motion. Neck supple. No tracheal deviation present. No thyromegaly present.  No neck mass/swelling.   Cardiovascular: Normal rate, regular rhythm, normal heart sounds and intact distal pulses. Exam reveals no gallop and no friction rub.  No murmur heard. Pulmonary/Chest: Effort normal and breath sounds normal. No accessory muscle usage. No respiratory distress.  Mild wheezing, good air exchange.   Abdominal: Soft. Bowel sounds are normal. He exhibits no distension. There is no tenderness.  Musculoskeletal: He exhibits no edema or tenderness.  Neurological: He is alert.  Speech normal. Steady gait.   Skin: Skin is warm and dry. He is not diaphoretic.  Psychiatric: He has a normal mood and affect.  Nursing note and vitals reviewed.    ED Treatments / Results  Labs (all  labs ordered are listed, but only abnormal results are displayed) Results for orders placed or performed during the hospital encounter of 72/09/47  Basic metabolic panel  Result Value Ref Range   Sodium 133 (L) 135 - 145 mmol/L   Potassium 4.2 3.5 - 5.1 mmol/L   Chloride 99 (L) 101 - 111 mmol/L   CO2 23 22 - 32 mmol/L   Glucose, Bld 95 65 - 99 mg/dL   BUN 13 6 - 20 mg/dL   Creatinine, Ser 1.42 (H) 0.61 - 1.24 mg/dL   Calcium 9.9 8.9 - 10.3 mg/dL   GFR calc non Af Amer 53 (L) >60 mL/min   GFR calc Af Amer >60 >60 mL/min   Anion gap 11 5 - 15  CBC  Result Value Ref Range   WBC 5.1 4.0 - 10.5 K/uL   RBC 3.96 (L) 4.22 - 5.81 MIL/uL   Hemoglobin 12.9 (L) 13.0 - 17.0 g/dL   HCT 37.4 (L) 39.0 - 52.0 %   MCV 94.4 78.0 - 100.0 fL   MCH 32.6 26.0 - 34.0 pg   MCHC 34.5 30.0 - 36.0 g/dL   RDW 13.5 11.5 - 15.5 %   Platelets 171 150 - 400 K/uL  I-stat troponin, ED  Result Value Ref Range   Troponin i, poc 0.00 0.00 - 0.08 ng/mL   Comment 3           Dg Chest 2 View  Result Date: 01/14/2018 CLINICAL DATA:  Worsening shortness of breath, and chest tightness without pain for the past  several days. History of COPD. Current smoker. History of tonsillar malignancy. EXAM: CHEST - 2 VIEW COMPARISON:  Chest x-ray of October 31, 2017 FINDINGS: The lungs are hyperinflated but clear. There is stable pleuroparenchymal scarring in the apices. No suspicious masses are observed. The heart and pulmonary vascularity are normal. The mediastinum is normal in width. There is no pleural effusion. The bony thorax is unremarkable. IMPRESSION: Mild hyperinflation consistent with COPD in the patient's smoking history. No pneumonia nor other acute cardiopulmonary abnormality. Electronically Signed   By: David  Martinique M.D.   On: 01/14/2018 09:54    EKG EKG Interpretation  Date/Time:  Monday Jan 14 2018 15:13:55 EDT Ventricular Rate:  68 PR Interval:    QRS Duration: 103 QT Interval:  398 QTC Calculation: 424 R Axis:   107 Text Interpretation:  Sinus rhythm Borderline short PR interval Left posterior fascicular block Nonspecific T wave abnormality Confirmed by Lajean Saver [phone]) on 01/14/2018 3:34:44 PM   Radiology Dg Chest 2 View  Result Date: 01/14/2018 CLINICAL DATA:  Worsening shortness of breath, and chest tightness without pain for the past several days. History of COPD. Current smoker. History of tonsillar malignancy. EXAM: CHEST - 2 VIEW COMPARISON:  Chest x-ray of October 31, 2017 FINDINGS: The lungs are hyperinflated but clear. There is stable pleuroparenchymal scarring in the apices. No suspicious masses are observed. The heart and pulmonary vascularity are normal. The mediastinum is normal in width. There is no pleural effusion. The bony thorax is unremarkable. IMPRESSION: Mild hyperinflation consistent with COPD in the patient's smoking history. No pneumonia nor other acute cardiopulmonary abnormality. Electronically Signed   By: David  Martinique M.D.   On: 01/14/2018 09:54    Procedures Procedures (including critical care time)  Medications Ordered in ED Medications  famotidine  (PEPCID) tablet 20 mg (has no administration in time range)  gi cocktail (Maalox,Lidocaine,Donnatal) (has no administration in time range)  albuterol (PROVENTIL) (2.5 MG/3ML) 0.083%  nebulizer solution 5 mg (has no administration in time range)     Initial Impression / Assessment and Plan / ED Course  I have reviewed the triage vital signs and the nursing notes.  Pertinent labs & imaging results that were available during my care of the patient were reviewed by me and considered in my medical decision making (see chart for details).  Labs. Cxr.  cxr reviewed - no pna.   Labs reviewed, wbc normal. Trop neg.   Albuterol neb. Gi meds for symptom relief.  Recheck, no stridor, no trouble breathing or swallowing. No wheezing or increased wob.  Reviewed nursing notes and prior charts for additional history. Prior ct chest with 2 cm lung nodule, possible ca - pt has appt with pulm for further eval within the week.     Final Clinical Impressions(s) / ED Diagnoses   Final diagnoses:  None    ED Discharge Orders    None       Lajean Saver, MD 01/14/18 1535

## 2018-01-14 NOTE — Discharge Instructions (Signed)
It was our pleasure to provide your ER care today - we hope that you feel better.  If wheezing, use albuterol inhaler as need.   If gi symptoms, take protonix (acid blocker medication). You may also try maalox or pepcid as need.   For your lung nodule and respiratory symptoms, follow up with pulmonary specialist as planned in the coming week.   Return to ER if worse, new symptoms, fevers, chest pain, increased trouble breathing, other concern.

## 2018-01-15 ENCOUNTER — Ambulatory Visit (INDEPENDENT_AMBULATORY_CARE_PROVIDER_SITE_OTHER): Payer: Medicaid Other | Admitting: Internal Medicine

## 2018-01-15 ENCOUNTER — Encounter: Payer: Self-pay | Admitting: Internal Medicine

## 2018-01-15 VITALS — BP 112/80 | HR 80 | Ht 73.5 in | Wt 137.0 lb

## 2018-01-15 DIAGNOSIS — J449 Chronic obstructive pulmonary disease, unspecified: Secondary | ICD-10-CM

## 2018-01-15 DIAGNOSIS — F17210 Nicotine dependence, cigarettes, uncomplicated: Secondary | ICD-10-CM | POA: Diagnosis not present

## 2018-01-15 MED FILL — PANTOPRAZOLE SOD DR 40 MG T: 40 | 30 days supply | Qty: 30 | Fill #0

## 2018-01-15 MED FILL — PROAIR HFA 90 MCG INHALER: 108 (90 BAS | 16 days supply | Qty: 9 | Fill #0

## 2018-01-15 NOTE — Patient Instructions (Addendum)
Pantoprazole 40 mg (= 2 omeprazole)  Take 30- 60 min before your first and last meals of the day   GERD (REFLUX)  is an extremely common cause of respiratory symptoms just like yours , many times with no obvious heartburn at all.    It can be treated with medication, but also with lifestyle changes including elevation of the head of your bed (ideally with 6 inch  bed blocks),  Smoking cessation, avoidance of late meals, excessive alcohol, and avoid fatty foods, chocolate, peppermint, colas, red wine, and acidic juices such as orange juice.  NO MINT OR MENTHOL PRODUCTS SO NO COUGH DROPS   USE SUGARLESS CANDY INSTEAD (Jolley ranchers or Stover's or Life Savers) or even ice chips will also do - the key is to swallow to prevent all throat clearing. NO OIL BASED VITAMINS - use powdered substitutes.   Return to see Dr Vernona Rieger or his NP after your CT scan  01/23/18

## 2018-01-15 NOTE — Assessment & Plan Note (Signed)
4-5 min discussion re active cigarette smoking in addition to office E&M  Ask about tobacco use:  ongoing Advise quitting     I reviewed the Fletcher curve with the patient that basically indicates  if you quit smoking when your best day FEV1 is still well preserved (as is clearly  the case here)  it is highly unlikely you will progress to severe disease and informed the patient there was  no medication on the market that has proven to alter the curve/ its downward trajectory  or the likelihood of progression of their disease(unlike other chronic medical conditions such as atheroclerosis where we do think we can change the natural hx with risk reducing meds)    Therefore stopping smoking and maintaining abstinence are  the most important aspects of care, not choice of inhalers or for that matter, doctors.  Treatment other than smoking cessation  is entirely directed by severity of symptoms and focused also on reducing exacerbations, not attempting to change the natural history of the disease.  rx for now prn saba Assess willingness not ready  Assist in quit attempt  When ready or per PCP Arrange follow up. F/u Dr Lamonte Sakai pcp

## 2018-01-15 NOTE — Progress Notes (Signed)
Subjective:    Patient ID: [name] [name], male    DOB: 02/01/1956   MRN: [id number]  Brief patient profile:  82 yobm with pna as child/good ex tol despite  Long smoking  with no problem with any resp problems until developing sinus problems Dec 2013 which required multiple trips to ER then started with variable sob Sept 2014 referred to pulmonary clinic 07/25/2013 for eval of sob and proved to have a minimal GOLD II severity 07/15/13 and quit smoking Dec 2015    History of Present Illness  07/25/2013 1st [facility] office visit/ [name] [name] cc variable doe x 2 months also at rest with anxiety which gets better s rx after an our, some throat clearing with dark mucus in ams and recurrent severe facial pain that gets better with abx/ flonase then relapses off it.  rec Prednisone 10 mg take  4 each am x 2 days,   2 each am x 2 days,  1 each am x 2 days and stop  Augmentin 875 mg take one pill twice daily  X 10 days - take at breakfast and supper with large glass of water.  It would help reduce the usual side effects (diarrhea and yeast infections) if you ate cultured yogurt at lunch.  symbicort 160 Take 2 puffs first thing in am and then another 2 puffs about 12 hours later if you feel it helps Work on inhaler technique:  relax and gently blow all the way out then take a nice smooth deep breath back in, triggering the inhaler at same time you start breathing in.  Hold for up to 5 seconds if you can.  Rinse and gargle with water when done The key is to stop smoking completely before smoking completely stops you!  Please see patient coordinator before you leave today  to schedule sinus CT in 2 weeks (no sooner) Pulmonary follow up is not needed but we can see you if you don't feel the symbicort is helping     03/16/2014 f/u ov/[name] [name] re: ? aecopd Chief Complaint  Patient presents with  . Acute Visit    Pt c/o increased SOB with or without exertion for the past 2 wks.   did not maint symbicort /  had 2 bad days x 6 m then 2 weeks prior to OV  Restarted symbicort but no better  > ER 03/12/14 some better with neb  Rec symbicort 160 Take 2 puffs first thing in am and then another 2 puffs about 12 hours later.  Only use your albuterol (proair) as a rescue medication   Try prilosec 20mg   Take 30-60 min before first meal of the day and Pepcid 20 mg one bedtime until cough is completely gone for at least a week without the need for cough suppression     03/25/2014 f/u ov/[name] [name] re: sob on symbicort 160 2bid and prn saba up to 4 x daily  Chief Complaint  Patient presents with  . Acute Visit    Pt c/o increased SOB since this am.  He also states "feels like I am losing my voice.     Symptoms as bad at rest as with exertion, assoc with hoarsness and dry cough. Pt not compliant with omeprazole "they make me sleepy" - still not smoking rec Clonazepam 0.5 mg twice daily  Late add: instructed to stop omeprazole and take pepcid 20 mg pc bic ? Adverse effect of meds - hoarseness may be from symbicort > consider change to spiriva  respimat   04/08/2014 f/u ov/[name] [name] re: sob/ barely GOLD II copd  Chief Complaint  Patient presents with  . Follow-up    Pt states that his breathing has improved, but not back to his normal baseline.  Using rescue inhaler on average 2 x per day.   doe x  50 ft but also at rest, not reproducible in office. No noct or early am symptoms Gi / anxiety symptoms better on pepcid and clonazepam  rec Stop symbicort to see what difference it makes in your breathing with activity.    10/24/17 [name] [name] rec We will perform blood work today to rule out tuberculosis since you were exposed to this many years ago. We will plan to repeat your CT scan of the chest in May 2019 to compare with your priors. Stop Symbicort altogether. Keep albuterol available to use 2 puffs if needed for shortness of breath, wheezing, chest tightness.   05/09/17 eval by [name] [name] c/w gerd > rec prilosec p one month  then stopped around 12/03/17 and did not refill until 12/31/17  But did not actually restart it    01/15/2018 acute extended ov/[name] [name] re:  Copd GOLD I/ still smoking with acute sob/globus p stopped gerd rx  Chief Complaint  Patient presents with  . Acute Visit    Increased SOB and trouble swallowing for the past wk. He states he feels like there is a "lump in my throat".    Gradually worse globus ever since stoppped gerd rx rec by [name] [name] > to ER 04/4165 with sob / dry cough and severe globus with only mild wheezing n exam, "good air exchange"  rx with neb/ protonix / gi cocktail and ativan and improved   Some better p er eval, confused with meds, did not try saba prior to going to ER    No obvious day to day or daytime variability or assoc excess/ purulent sputum or mucus plugs or hemoptysis or cp or chest tightness, subjective wheeze or overt sinus or hb symptoms. No unusual exposure hx or h/o childhood pna/ asthma or knowledge of premature birth.  Sleeping  Ok   without nocturnal  or early am exacerbation  of respiratory  c/o's or need for noct saba. Also denies any obvious fluctuation of symptoms with weather or environmental changes or other aggravating or alleviating factors except as outlined above   Current Allergies, Complete Past Medical History, Past Surgical History, Family History, and Social History were reviewed in [employer] record.  ROS  The following are not active complaints unless bolded Hoarseness, sore throat, dysphagia, dental problems, itching, sneezing,  nasal congestion or discharge of excess mucus or purulent secretions, ear ache,   fever, chills, sweats, unintended wt loss or wt gain, classically pleuritic or exertional cp,  orthopnea pnd or arm/hand swelling  or leg swelling, presyncope, palpitations, abdominal pain, anorexia, nausea, vomiting, diarrhea  or change in bowel habits or change in bladder habits, change in stools or change in urine,  dysuria, hematuria,  rash, arthralgias, visual complaints, headache, numbness, weakness or ataxia or problems with walking or coordination,  change in mood or  memory.        Current Meds  Medication Sig  . albuterol (PROVENTIL HFA;VENTOLIN HFA) 108 (90 Base) MCG/ACT inhaler Inhale 2 puffs into the lungs every 4 (four) hours as needed for wheezing or shortness of breath.  Marland Kitchen aspirin EC 81 MG tablet Take 81 mg by mouth daily.  Objective:   Physical Exam   amb anxious bm nad   03/16/2014       160 > 03/25/2014   158 > 04/08/2014  158 > 01/15/2018   137  Wt Readings from Last 3 Encounters:  07/25/13 148 lb 3.2 oz (67.223 kg)  06/09/13 145 lb 8 oz (65.998 kg)  01/29/13 142 lb 9.6 oz (64.683 kg)    Vital signs reviewed - Note on arrival 02 sats  100% on  RA       HEENT: nl dentition, turbinates bilaterally, and oropharynx. Nl external ear canals without cough reflex   NECK :  without JVD/Nodes/TM/ nl carotid upstrokes bilaterally   LUNGS: no acc muscle use,  Nl contour chest which is clear to A and P bilaterally without cough on insp or exp maneuvers   CV:  RRR  no s3 or murmur or increase in P2, and no edema   ABD:  soft and nontender with nl inspiratory excursion in the supine position. No bruits or organomegaly appreciated, bowel sounds nl  MS:  Nl gait/ ext warm without deformities, calf tenderness, cyanosis or clubbing No obvious joint restrictions   SKIN: warm and dry without lesions    NEURO:  alert, approp, nl sensorium with  no motor or cerebellar deficits apparent.    I personally reviewed images and agree with radiology impression as follows:  CXR:   01/14/18  Mild hyperinflation consistent with COPD in the patient's smoking history. No pneumonia nor other acute cardiopulmonary abnormality.       Assessment & Plan:

## 2018-01-15 NOTE — Assessment & Plan Note (Signed)
-   PFT's 07/15/13 FEV1  2.72 (78%) ratio 67 and no change p B2 and DLCO 75 corrects to 92%  - 03/16/2014 p extensive coaching HFA effectiveness =    75%  - 03/25/2014  Walked RA x 3 laps @ 185 ft each stopped due to  No sob /desat - Spirometry 01/15/2018  FEV1 2.70 (75%)  Ratio 68 min curvature p saba w/in 4 h  - 01/15/2018  After extensive coaching inhaler device  effectiveness =    90%  > rec max gerd rx and prn saba and f/u with Dr Lamonte Sakai in 2 weeks    Symptoms are markedly disproportionate to objective findings and not clear to what extent this is actually a pulmonary  problem but pt does appear to have difficult to sort out respiratory symptoms of unknown origin for which  DDX  = almost all start with A and  include Adherence, Ace Inhibitors, Acid Reflux, Active Sinus Disease, Alpha 1 Antitripsin deficiency, Anxiety masquerading as Airways dz,  ABPA,  Allergy(esp in young), Aspiration (esp in elderly), Adverse effects of meds,  Active smokers, A bunch of PE's/clot burden (a few small clots can't cause this syndrome unless there is already severe underlying pulm or vascular dz with poor reserve),  Anemia or thyroid disorder, plus two Bs  = Bronchiectasis and Beta blocker use..and one C= CHF     Adherence is always the initial "prime suspect" and is a multilayered concern that requires a "trust but verify" approach in every patient - starting with knowing how to use medications, especially inhalers, correctly, keeping up with refills and understanding the fundamental difference between maintenance and prns vs those medications only taken for a very short course and then stopped and not refilled.  - see hfa teaching   Acid (or non-acid) GERD > always difficult to exclude as up to 75% of pts in some series report no assoc GI/ Heartburn symptoms and strongly suspect by [facility], worse off gerd rx > rec max (24h)  acid suppression and diet restrictions/ reviewed and instructions given in writing.   Active  smoking (see separate a/p)   ? Anxiety > usually at the bottom of this list of usual suspects but should be much higher on this pt's based on H and P and note already on psychotropics and may interfere with adherence and also interpretation of response or lack thereof to symptom management which can be quite subjective.   I had an extended discussion with the patient reviewing all relevant studies completed to date and  lasting 25 minutes of a 40  minute acute post ER office visit  With not familiar to me    re  severe non-specific but potentially very serious refractory respiratory symptoms of uncertain and potentially multiple  etiologies.  See device teaching which extended face to face time for this visit    Each maintenance medication was reviewed in detail including most importantly the difference between maintenance and prns and under what circumstances the prns are to be triggered using an action plan format that is not reflected in the computer generated alphabetically organized AVS.    Please see AVS for specific instructions unique to this office visit that I personally wrote and verbalized to the the pt in detail and then reviewed with pt  by my nurse highlighting any changes in therapy/plan of care  recommended at today's visit.

## 2018-01-23 ENCOUNTER — Ambulatory Visit (INDEPENDENT_AMBULATORY_CARE_PROVIDER_SITE_OTHER)
Admission: RE | Admit: 2018-01-23 | Discharge: 2018-01-23 | Disposition: A | Discharge status: Home / Self Care | Payer: Medicaid Other | Source: Ambulatory Visit | Attending: Internal Medicine | Admitting: Internal Medicine

## 2018-01-23 DIAGNOSIS — R918 Other nonspecific abnormal finding of lung field: Secondary | ICD-10-CM

## 2018-01-23 DIAGNOSIS — R911 Solitary pulmonary nodule: Secondary | ICD-10-CM

## 2018-01-30 IMAGING — MR MR HEAD W/O CM
9 of 11 series · 35 of 48 positions shown · non-contrast
Comparison: CT neck same day.  Previous head MRI 05/27/2015

CLINICAL DATA: Swelling of the left neck and dizziness, several
days duration.

EXAM:
MRI HEAD WITHOUT CONTRAST
TECHNIQUE: Multiplanar, multiecho pulse sequences of the brain and surrounding
structures were obtained without intravenous contrast.

[Series 2: FLAIR · sagittal · 5.0mm · 0.47mm/px · 1 of 23 slices shown (1 of 2)]
[im 1/23]
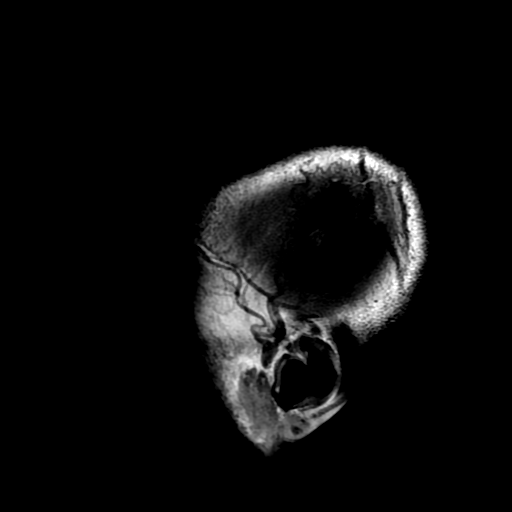

[Series 4: FLAIR · axial · 5.0mm · 0.94mm/px · z∈[-67,+83]mm · 2 of 26 slices shown (2 of 2)]
[im 1/26]
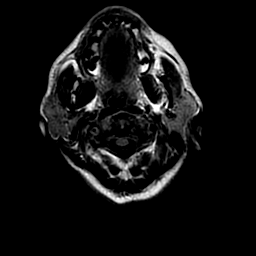
[im 26/26]
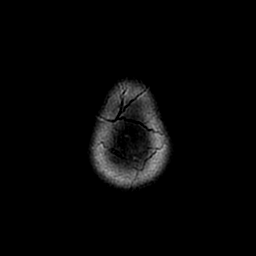

[Series 5: DWI · axial · 3.0mm · 0.94mm/px · z∈[-55,+86]mm · 8 of 96 slices shown (1 of 2)]
[im 1/96]
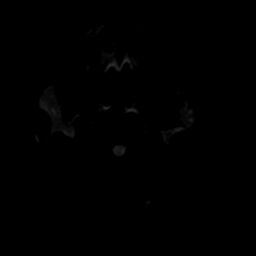
[im 14/96]
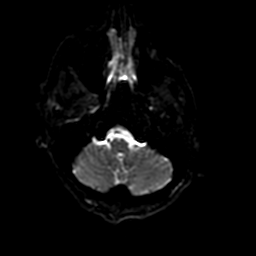
[im 28/96]
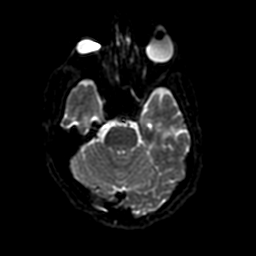
[im 41/96]
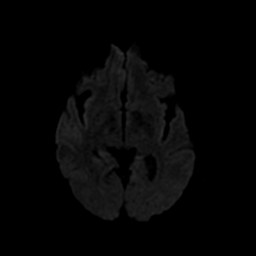
[im 55/96]
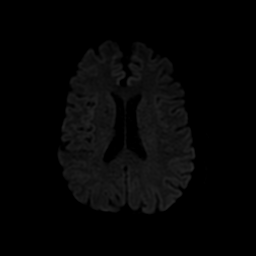
[im 68/96]
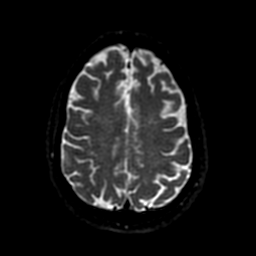
[im 82/96]
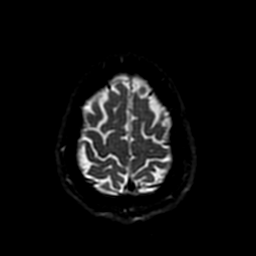
[im 96/96]
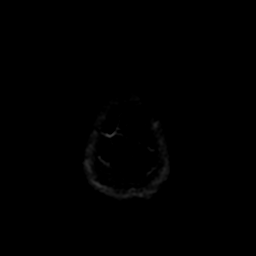

[Series 6: T2 · axial · 5.0mm · 0.47mm/px · z∈[-67,+83]mm · 2 of 26 slices shown]
[im 1/26]
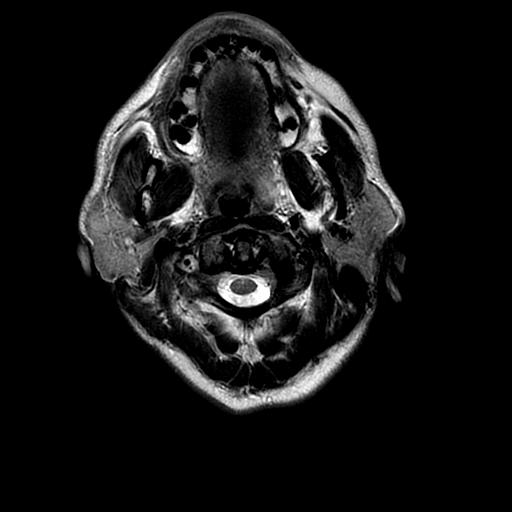
[im 26/26]
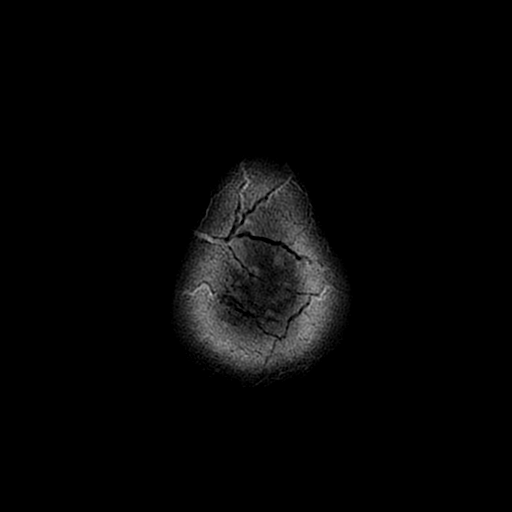

[Series 7: DWI · coronal · 4.0mm · 0.94mm/px · 6 of 72 slices shown (2 of 2)]
[im 1/72]
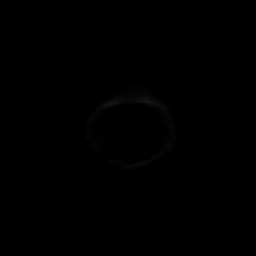
[im 15/72]
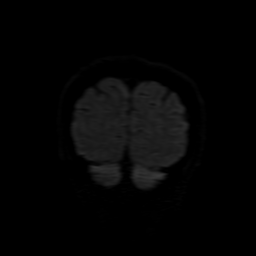
[im 29/72]
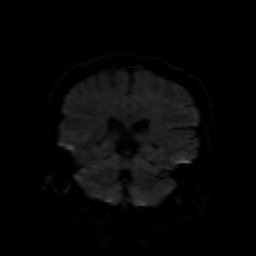
[im 43/72]
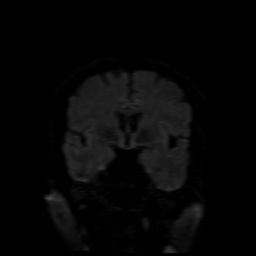
[im 57/72]
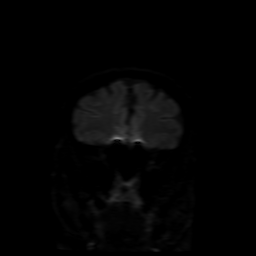
[im 72/72]
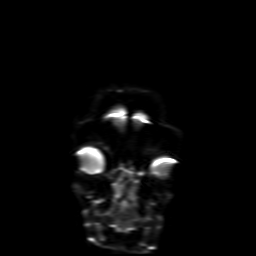

[Series 8: (person_name) · axial · 3.0mm · 0.47mm/px · z∈[-58,+67]mm · 7 of 100 slices shown]
[im 1/100]
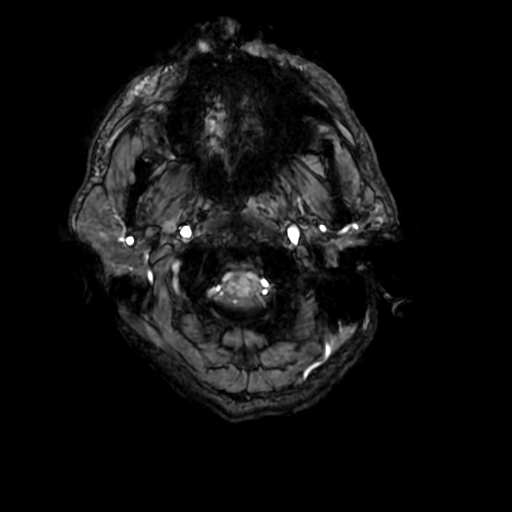
[im 15/100]
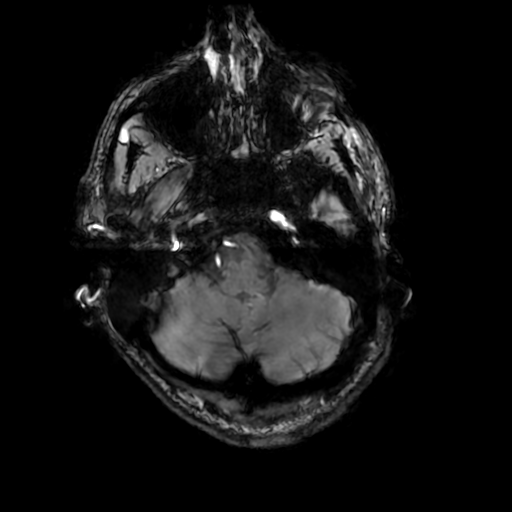
[im 29/100]
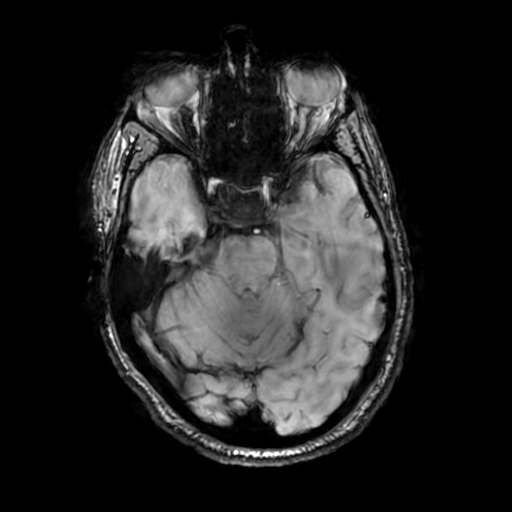
[im 43/100]
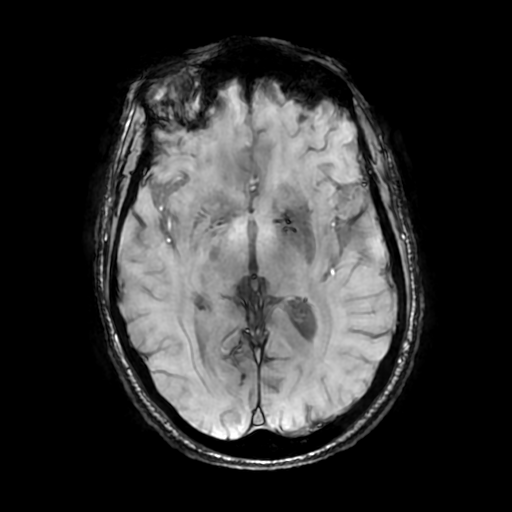
[im 57/100]
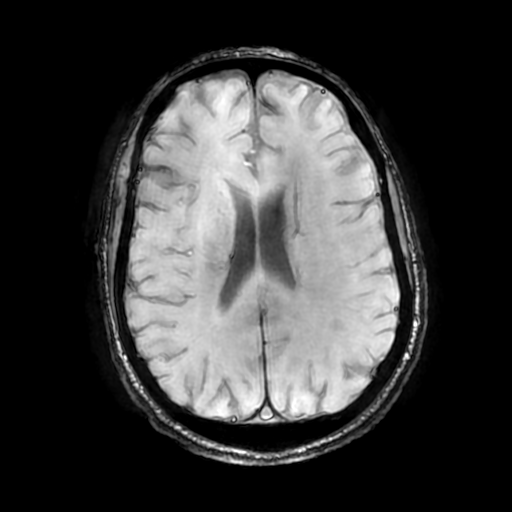
[im 71/100]
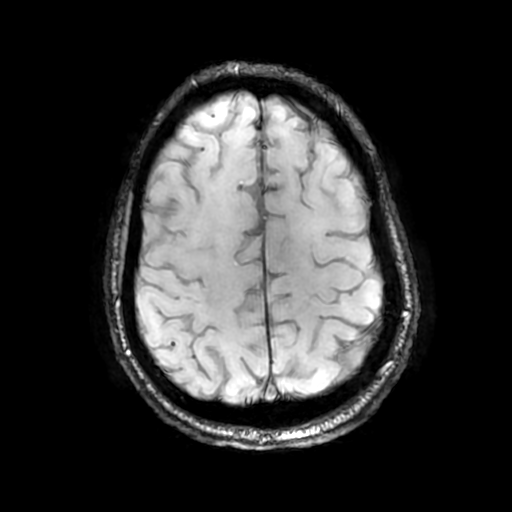
[im 85/100]
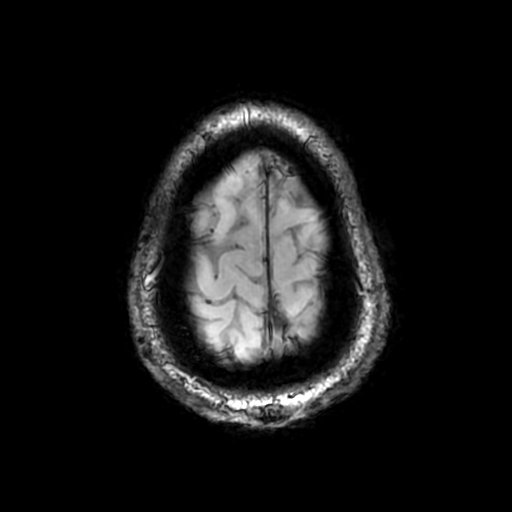

[Series 10: T2 post-contrast · coronal · 5.0mm · 0.39mm/px · 2 of 29 slices shown]
[im 1/29]
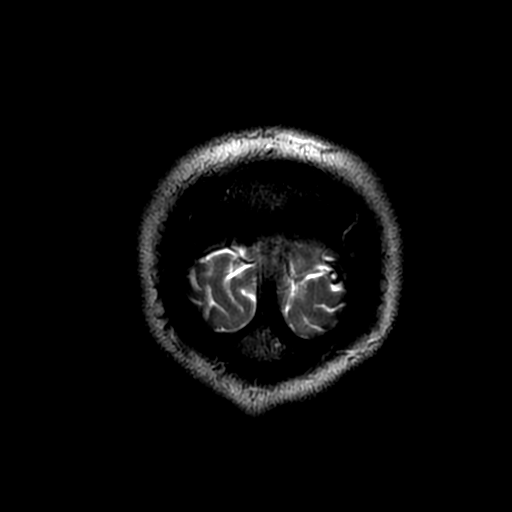
[im 29/29]
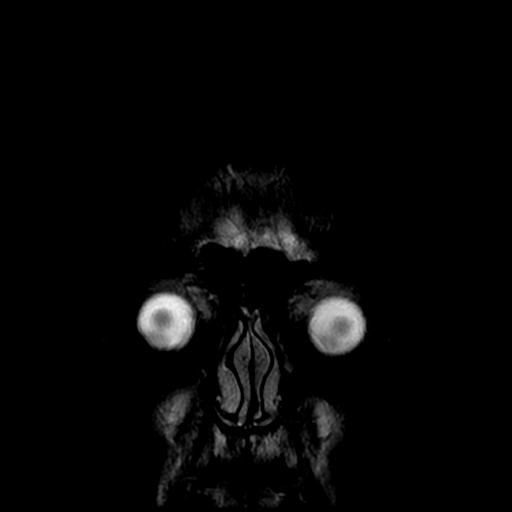

[Series 550: ADC · axial · 3.0mm · 0.94mm/px · z∈[-55,+86]mm · 4 of 48 slices shown (1 of 2)]
[im 1/48]
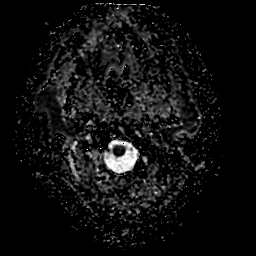
[im 16/48]
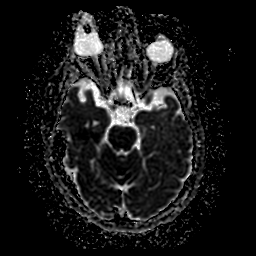
[im 32/48]
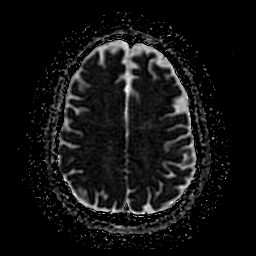
[im 48/48]
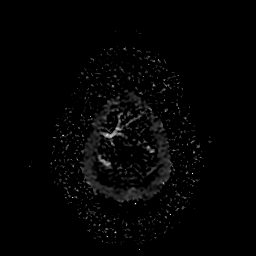

[Series 750: ADC · coronal · 4.0mm · 0.94mm/px · 3 of 36 slices shown (2 of 2)]
[im 1/36]
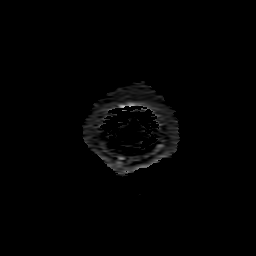
[im 18/36]
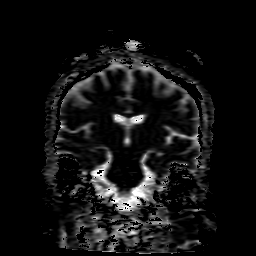
[im 36/36]
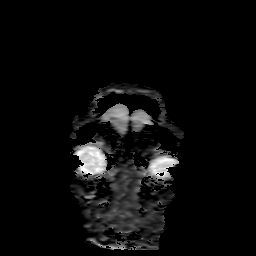

[35 of 48 positions shown; findings below may reference images not displayed]

FINDINGS: Diffusion imaging does not show any acute or subacute infarction.
Restricted diffusion lesion is seen in the left tonsil, further
described on the neck CT. Brainstem and cerebellum are normal.
Cerebral hemispheres show changes of chronic small vessel disease
within the white matter, similar to the previous study. No large
vessel territory infarction. No mass lesion, hemorrhage,
hydrocephalus or extra-axial collection. No pituitary mass. No
inflammatory sinus disease. No skull or skullbase lesion.
IMPRESSION: No acute intracranial finding. Chronic small-vessel ischemic changes
of the cerebral hemispheric white matter.

Restricted diffusion left tonsillar mass, completely described at
neck CT.

## 2018-01-30 IMAGING — CT CT NECK W/ CM
3 of 5 series · 13 of 33 positions shown, 16 images · IV contrast (Omni 300)
Comparison: None.

CLINICAL DATA: Swelling of the face and neck over the last month.
Focal prominence of the left side of the neck.

EXAM:
CT NECK WITH CONTRAST
TECHNIQUE: Multidetector CT imaging of the neck was performed using the
standard protocol following the bolus administration of intravenous
contrast.
CONTRAST:  75mL G52FXN-WOO IOPAMIDOL (G52FXN-WOO) INJECTION 61%

[Series 4: neck 2.0 st · sagittal · 0.42mm/px · 5 of 62 slices shown, 6 images (1 of 2)]
[im 21/62  bone]
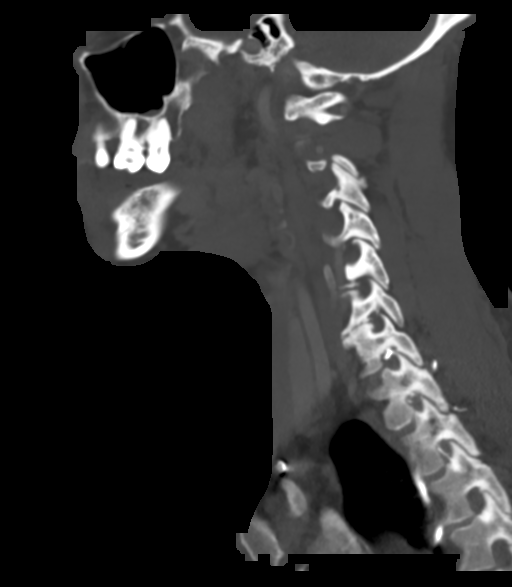
[im 26/62  bone]
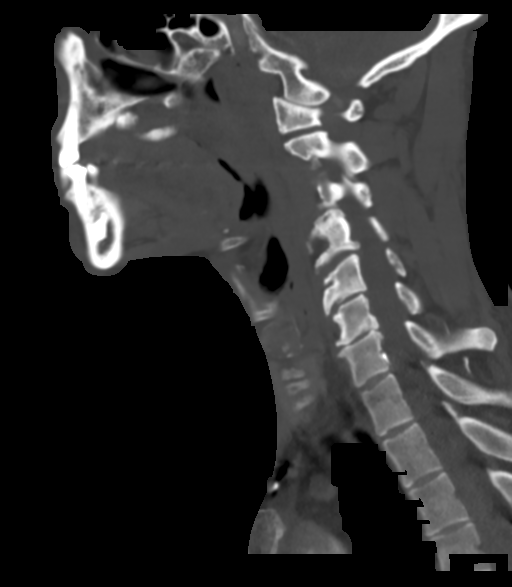
[im 31/62  soft-tissue]
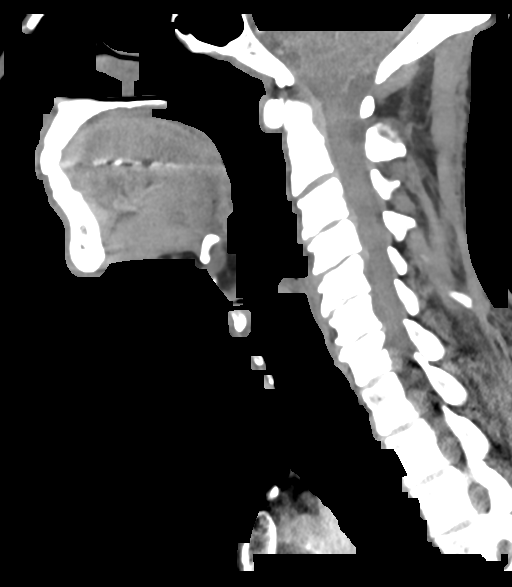
[im 31/62  bone]
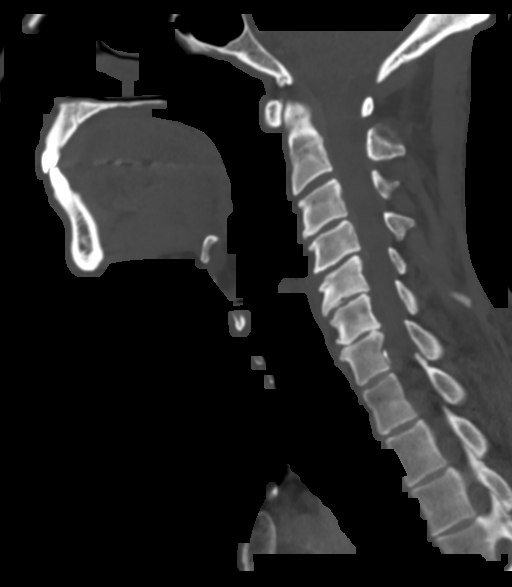
[im 36/62  bone]
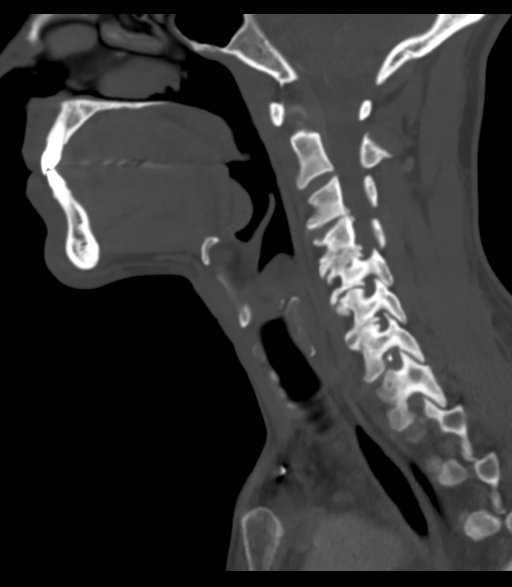
[im 41/62  bone]
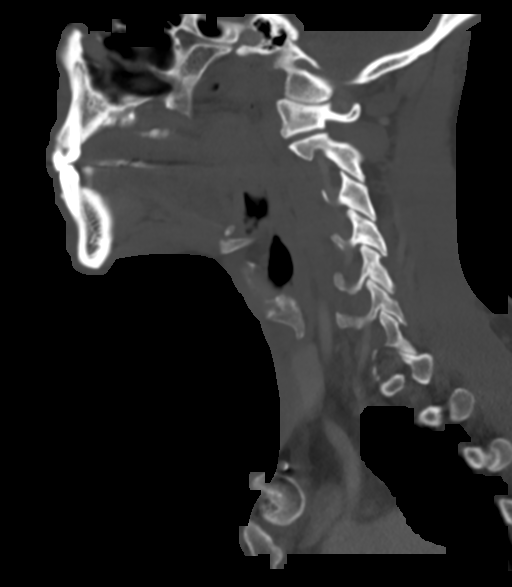

[Series 5: neck 2.0 st · coronal · 0.37mm/px · 3 of 110 slices shown (2 of 2)]
[im 32/110  bone]
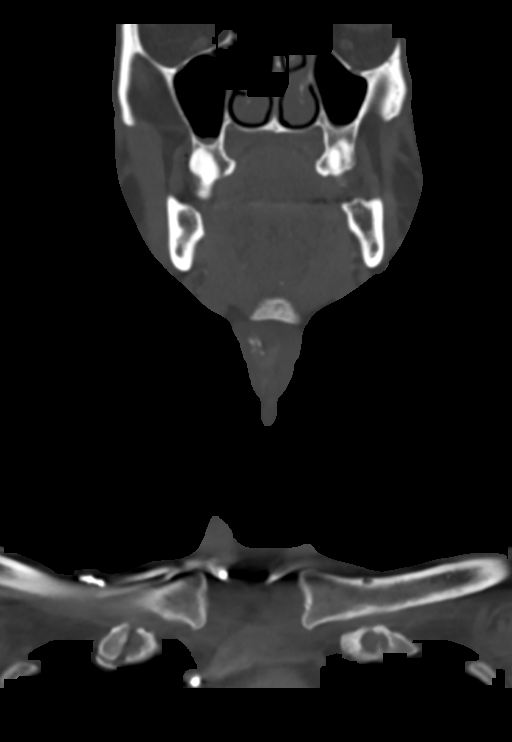
[im 47/110  bone]
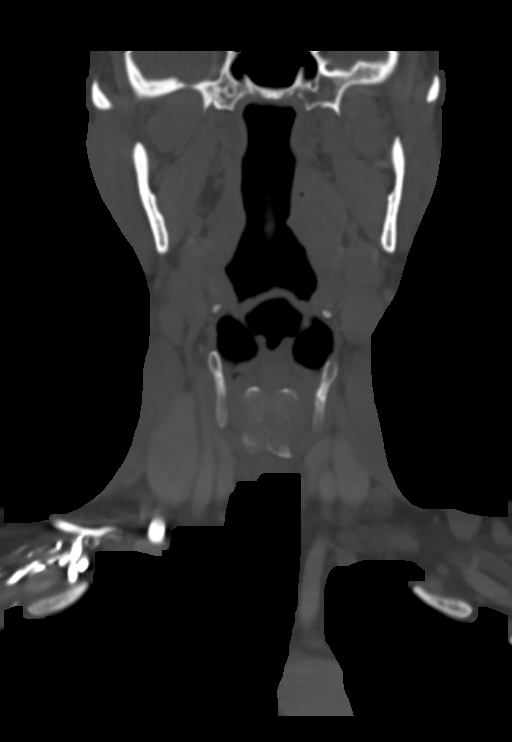
[im 63/110  bone]
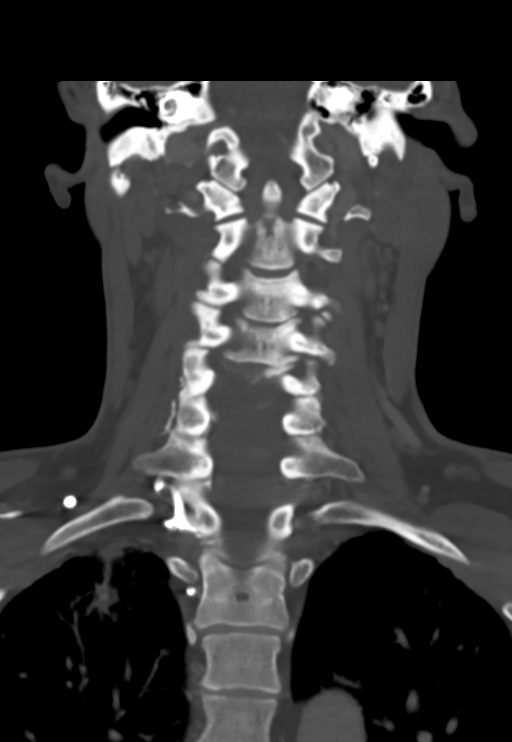

[Series 6: neck 2.0 st orthogonal · axial · 0.39mm/px · z∈[-278,-108]mm · 5 of 142 slices shown, 7 images]
[im 24/142  soft-tissue]
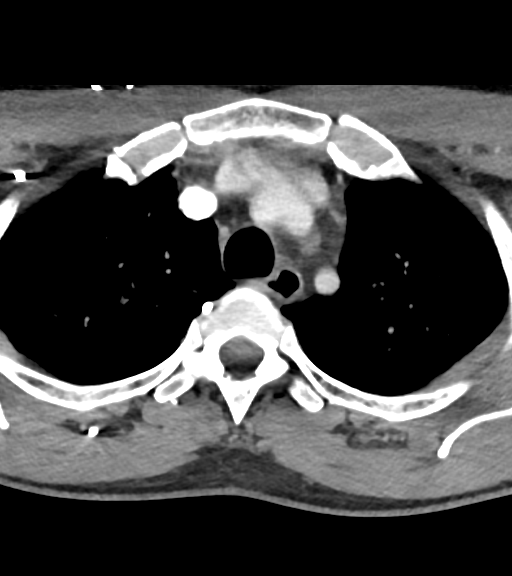
[im 24/142  bone]
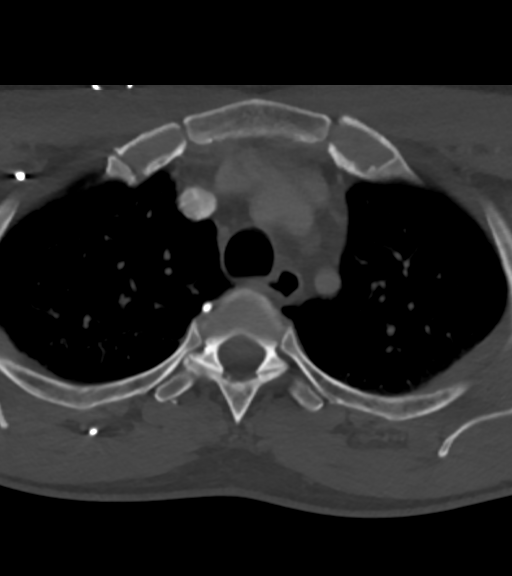
[im 48/142  bone]
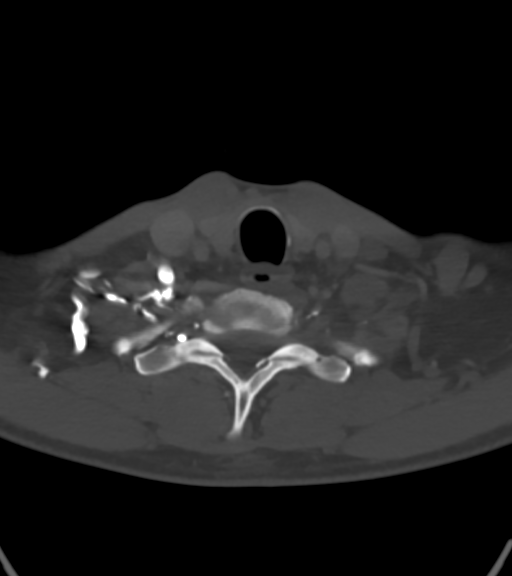
[im 71/142  bone]
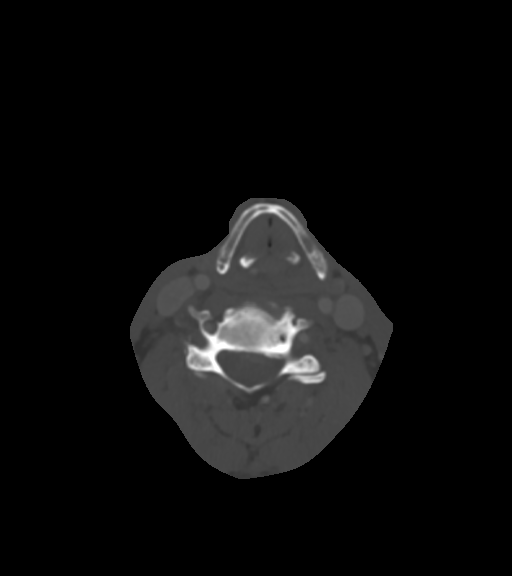
[im 95/142  bone]
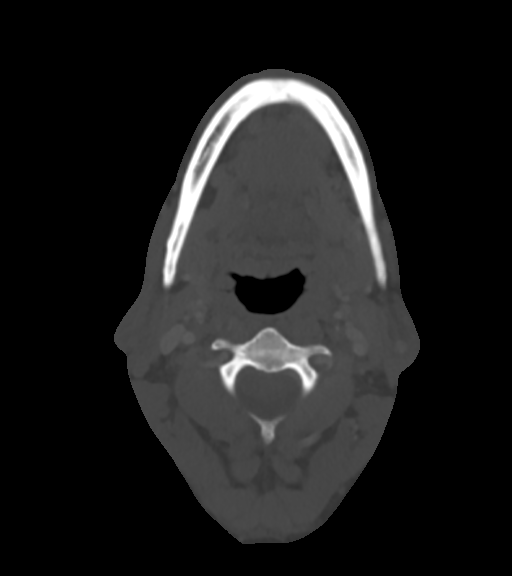
[im 118/142  soft-tissue]
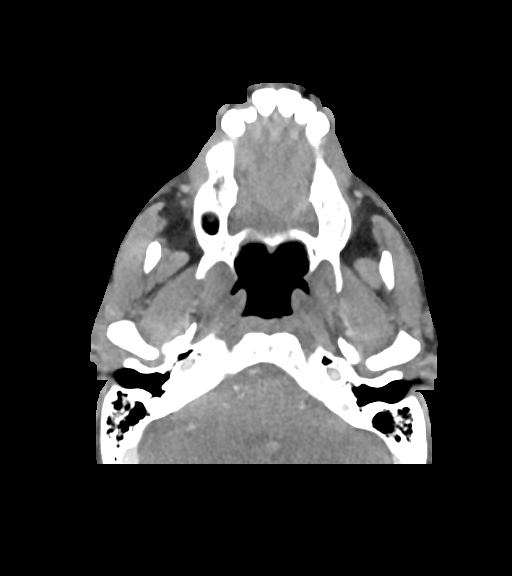
[im 118/142  bone]
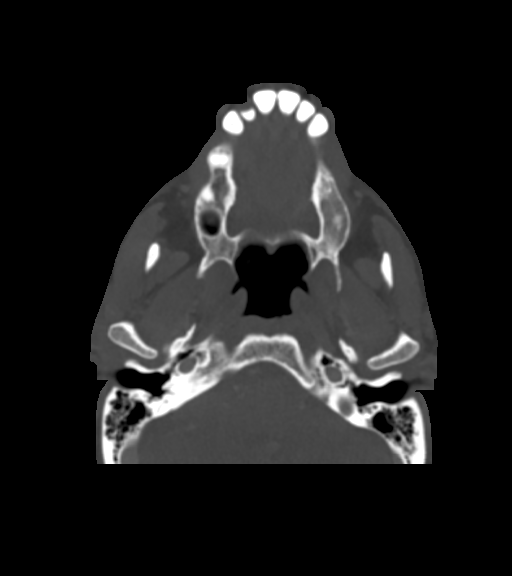

[13 of 33 positions shown; findings below may reference images not displayed]

FINDINGS: Pharynx and larynx: There is a 1.9 x 2.4 cm left tonsillar mass. No
other mucosal or submucosal lesion is seen.

Salivary glands: Submandibular and parotid glands are normal.

Thyroid: Normal

Lymph nodes: Level 2 nodal mass on the left measuring 3.3 by 1.9 by
2.1 cm consistent with metastatic disease. No abnormal nodes by size
criteria and level 3 or level 4. Enlarged supraclavicular/ level 5
nodes on the left measuring up to short axis dimension 11 mm.

Vascular: Normal

Limited intracranial: Normal

Visualized orbits: Normal

Mastoids and visualized paranasal sinuses: Clear

Skeleton: Ordinary cervical spondylosis

Upper chest: Focal density at the right lung apex consistent with
scarring, but with apparent growth in that region since 8 chest CT
dated 10/06/2014. Dominant nodular focus measures 13 mm, but appears
to contain more tissue volume. Complete chest CT evaluation
suggested. This may require further evaluation with PET.
IMPRESSION: Left tonsillar mass with diameter of 19 x 24 mm consistent with
tonsillar carcinoma. Metastatic level 2 nodes on the left without
necrosis. Suspicious level 5/supraclavicular nodes on the left.

Scar type density in the right upper lobe is more prominent than was
seen on a CT scan of the chest 10/06/2014. Therefore this is viewed
with some suspicion and malignancy in this location is possible.
Complete chest CT and possible PET for further evaluation.

## 2018-01-30 IMAGING — CR DG CHEST 2V
2 series · 2 of 2 positions shown · non-contrast
Comparison: Chest radiograph November 18, 2015 and chest CT October 06, 2014

CLINICAL DATA: Left-sided pharyngeal mass with lymphadenopathy

EXAM:
CHEST  2 VIEW

[chest pa]
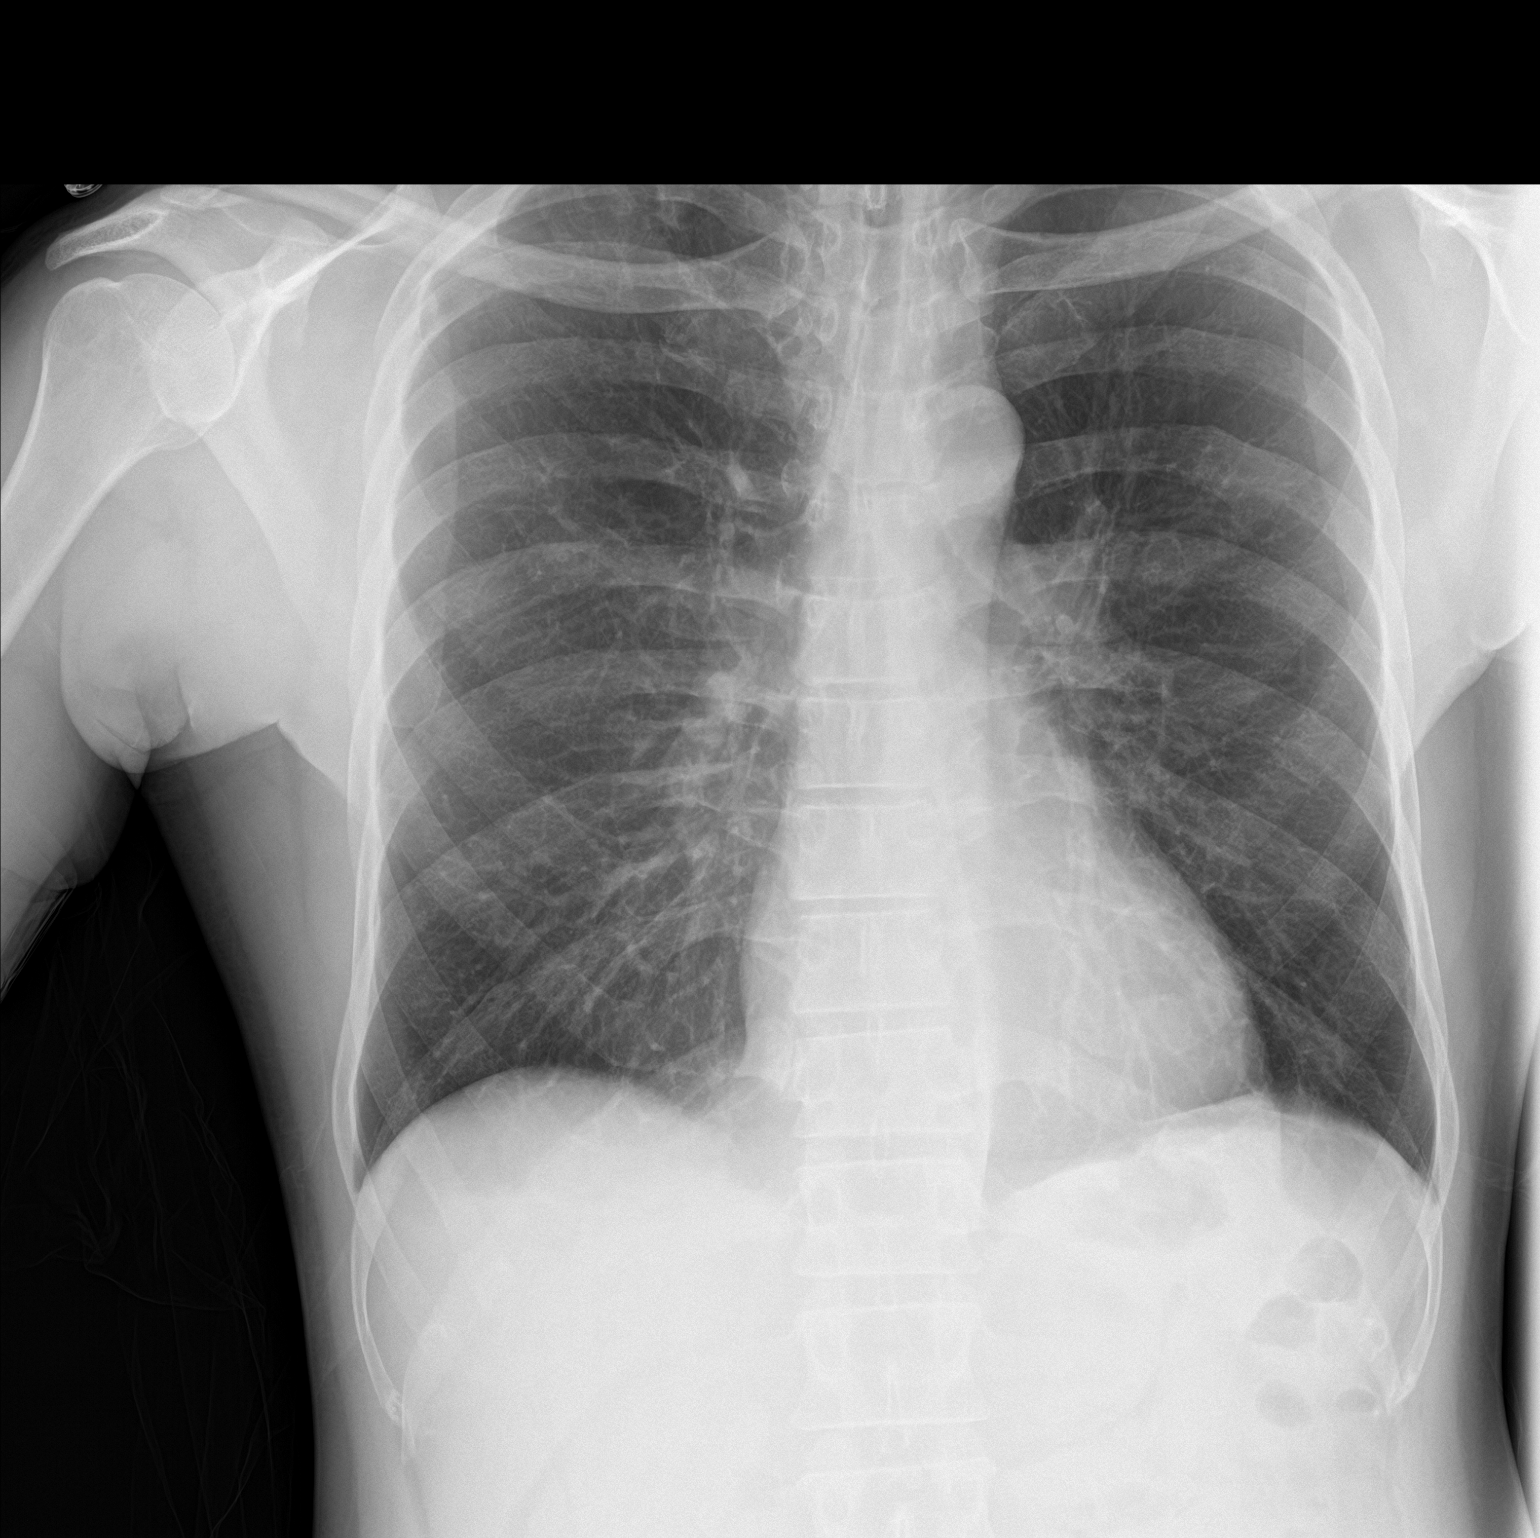

[chest lat]
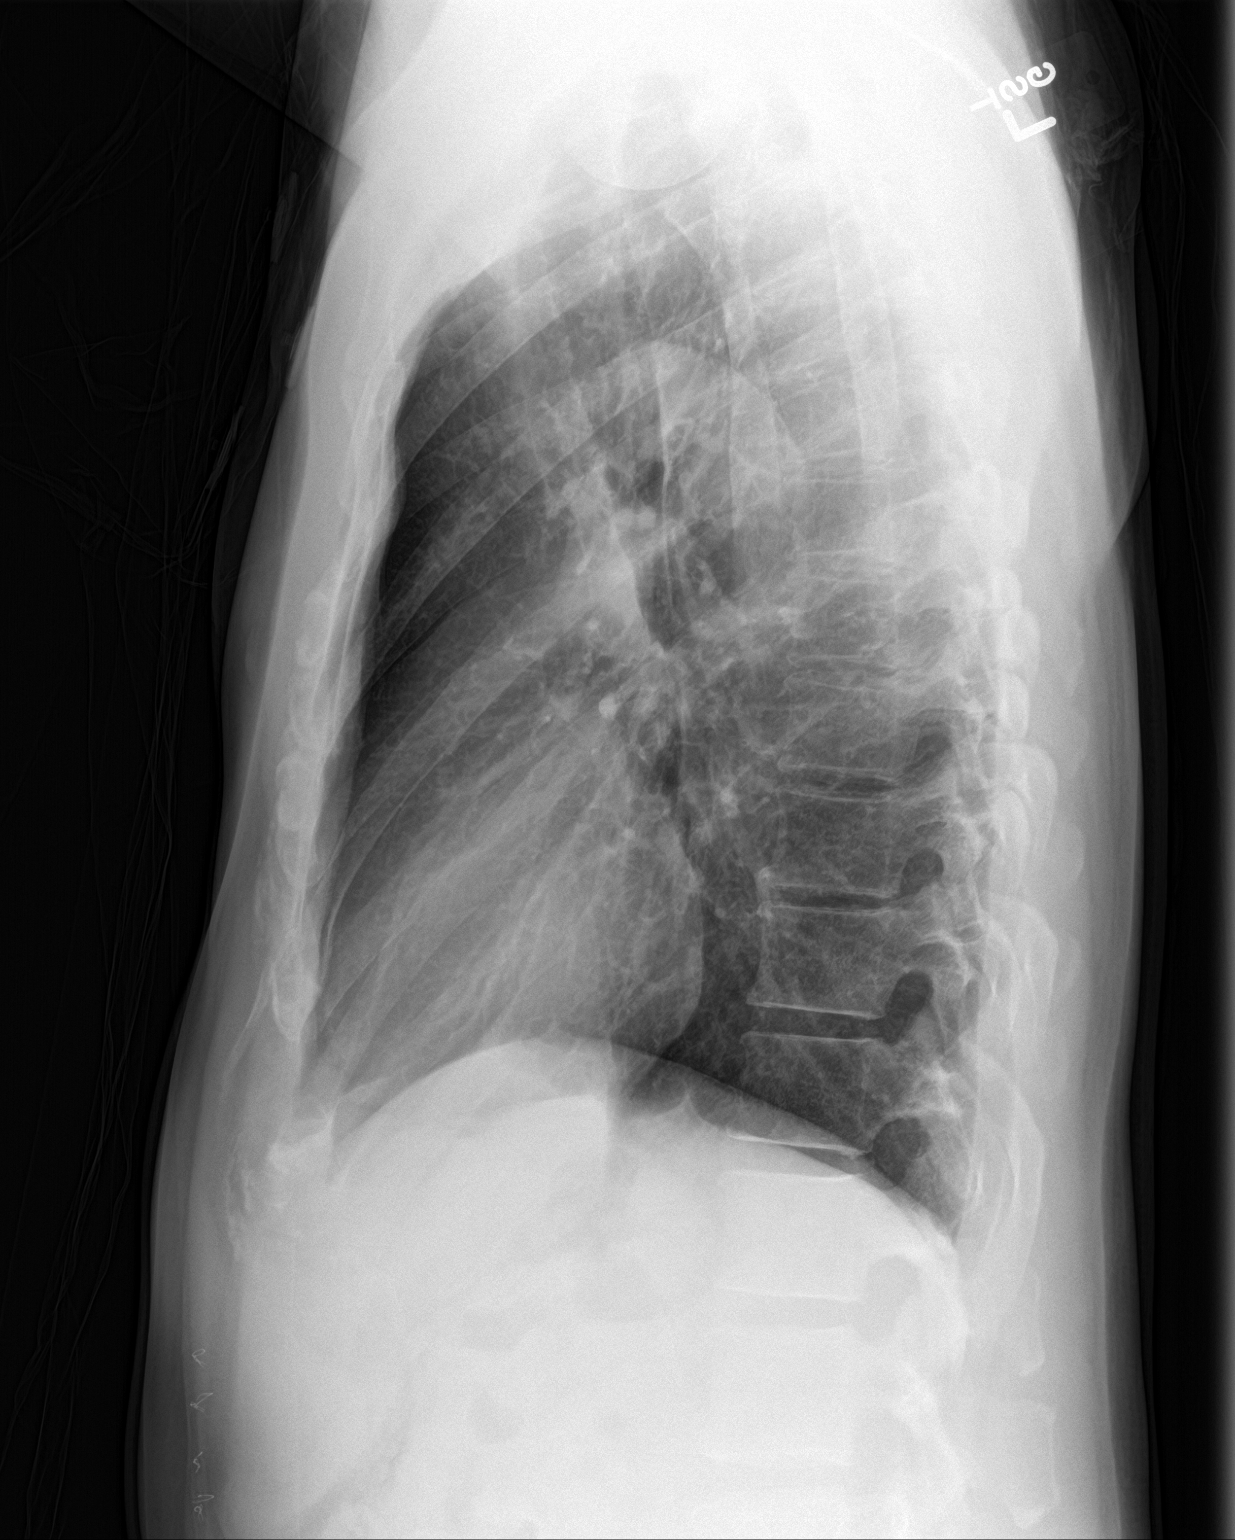

[2 of 2 positions shown; findings below may reference images not displayed]

FINDINGS: There is a small area of spiculation in the right apex measuring
approximately 1 cm in size which is stable compared to prior
studies. There is no edema or consolidation. Heart size and
pulmonary vascularity are normal. No adenopathy. There is an old
healed fracture of the posterior left sixth rib. No blastic or lytic
bone lesions are evident.
IMPRESSION: Small area spiculation in the right apex remains stable. No new
opacity. No edema or consolidation. Stable cardiac silhouette. No
adenopathy evident.

## 2018-02-04 ENCOUNTER — Encounter: Payer: Self-pay | Admitting: Acute Care

## 2018-02-04 ENCOUNTER — Ambulatory Visit (INDEPENDENT_AMBULATORY_CARE_PROVIDER_SITE_OTHER): Payer: Medicaid Other | Admitting: Acute Care

## 2018-02-04 VITALS — BP 128/80 | HR 76 | Ht 73.5 in | Wt 132.0 lb

## 2018-02-04 DIAGNOSIS — F17210 Nicotine dependence, cigarettes, uncomplicated: Secondary | ICD-10-CM | POA: Diagnosis not present

## 2018-02-04 DIAGNOSIS — J449 Chronic obstructive pulmonary disease, unspecified: Secondary | ICD-10-CM

## 2018-02-04 DIAGNOSIS — R918 Other nonspecific abnormal finding of lung field: Secondary | ICD-10-CM

## 2018-02-04 NOTE — Patient Instructions (Signed)
It is nice to meet you today. We will plan a repeat CT scan in 6 months, unless Dr. Lamonte Sakai wants to do something different. Continue using Pro Air as needed for shortness of breath. Let us know if you want to start maintenance inhalers for your COPD Continue to work on quitting smoking. Continue omeprazole 40 mg twice daily as you have been doing. This is the single most powerful action you can take to decrease your risk of lung cancer, stroke and heart disease. Follow up with Dr. Lamonte Sakai in 6 months Please contact office for sooner follow up if symptoms do not improve or worsen or seek emergency care

## 2018-02-04 NOTE — Assessment & Plan Note (Signed)
I have spent 5 minutes counseling patient on smoking cessation this visit.

## 2018-02-04 NOTE — Progress Notes (Signed)
History of Present Illness [name] [name] is a 62 y.o. male current every day smoker with COPD and multiple lung nodules on CT Chest which are being followed for growth. He is followed by Dr. Lamonte Sakai.    02/04/2018 Pt. Presents for follow up after CT Chest.  Pt presents for follow up. CT shows stable appearance of  right apical mixed attenuation nodule compared with 08/02/2017.QuantiFERON  Gold was negative. Pt. States he is using his omeprazole 40 mg BID as instructed by  Dr. Melvyn Novas. He is using his rescue inhaler very rarely and only when he gets short of breath with walking.Marland Kitchen He states his cough is Much better. He states he will continue this therapy. We discussed his CT scan and that the nodule does not appear to have grown. I have messaged Dr. Lamonte Sakai regarding follow up, but we will assume 6 month follow up . He denies fever, chest pain, orthopnea or hemoptysis.   Test Results: 01/2018 Super D CT Chest Stable appearance of right apical mixed attenuation nodule compared with 08/02/2017. Etiology remains indeterminate. Given the increased density and size since 10/06/2014 described on 08/02/2017, indolent low-grade neoplasm cannot be excluded. 2.  Emphysema (ICD10-J43.9). 3.  Aortic Atherosclerosis (ICD10-I70.0).  01/2018>> Quantiferon Gold : Negative  CBC Latest Ref Rng & Units 01/14/2018 10/31/2017 09/17/2017  WBC 4.0 - 10.5 K/uL 5.1 5.0 5.3  Hemoglobin 13.0 - 17.0 g/dL 12.9(L) 13.0 12.8(L)  Hematocrit 39.0 - 52.0 % 37.4(L) 36.9(L) 37.7(L)  Platelets 150 - 400 K/uL 171 175 177    BMP Latest Ref Rng & Units 01/14/2018 10/31/2017 09/17/2017  Glucose 65 - 99 mg/dL 95 87 97  BUN 6 - 20 mg/dL 13 14 22   Creatinine 0.61 - 1.24 mg/dL 1.42(H) 1.38(H) 1.60(H)  Sodium 135 - 145 mmol/L 133(L) 136 142  Potassium 3.5 - 5.1 mmol/L 4.2 4.0 4.6  Chloride 101 - 111 mmol/L 99(L) 101 105  CO2 22 - 32 mmol/L 23 23 27   Calcium 8.9 - 10.3 mg/dL 9.9 9.2 9.8    BNP    Component Value Date/Time   BNP 23.4  09/11/2014 1135    ProBNP    Component Value Date/Time   PROBNP 33.0 03/25/2014 1424    PFT    Component Value Date/Time   FEV1PRE 2.92 10/24/2017 1414   FEV1POST 2.98 10/24/2017 1414   FVCPRE 4.20 10/24/2017 1414   FVCPOST 4.19 10/24/2017 1414   TLC 6.18 10/24/2017 1414   DLCOUNC 18.40 10/24/2017 1414   PREFEV1FVCRT 70 10/24/2017 1414   PSTFEV1FVCRT 71 10/24/2017 1414    Dg Chest 2 View  Result Date: 01/14/2018 CLINICAL DATA:  Worsening shortness of breath, and chest tightness without pain for the past several days. History of COPD. Current smoker. History of tonsillar malignancy. EXAM: CHEST - 2 VIEW COMPARISON:  Chest x-ray of October 31, 2017 FINDINGS: The lungs are hyperinflated but clear. There is stable pleuroparenchymal scarring in the apices. No suspicious masses are observed. The heart and pulmonary vascularity are normal. The mediastinum is normal in width. There is no pleural effusion. The bony thorax is unremarkable. IMPRESSION: Mild hyperinflation consistent with COPD in the patient's smoking history. No pneumonia nor other acute cardiopulmonary abnormality. Electronically Signed   By: David  Martinique M.D.   On: 01/14/2018 09:54   Ct Super D Chest Wo Contrast  Result Date: 01/23/2018 CLINICAL DATA:  Evaluate right upper lobe lung nodule. EXAM: CT CHEST WITHOUT CONTRAST TECHNIQUE: Multidetector CT imaging of the chest was performed using thin  slice collimation for electromagnetic bronchoscopy planning purposes, without intravenous contrast. COMPARISON:  PET-CT 09/17/2017 and CT chest 08/02/2017. FINDINGS: Cardiovascular: The heart size appears within normal limits. Mild aortic atherosclerosis. No pericardial effusion. Mediastinum/Nodes: Normal appearance of the thyroid gland. The trachea appears patent and is midline. Normal appearance of the esophagus. No enlarged mediastinal or hilar lymph nodes. Lungs/Pleura: Biapical pleuroparenchymal scarring is identified. Mild to  moderate changes of emphysema identified. No acute process such as airspace consolidation, atelectasis or pneumothorax. The right apical mixed attenuation nodule measures 1.8 by 0.9 cm, image 23/4. Previously 1.8 by 1.0 cm. Small adjacent lung nodule measures 4 mm, image 31/3. Upper Abdomen: No acute abnormality. Musculoskeletal: No chest wall mass or suspicious bone lesions identified. IMPRESSION: 1. Stable appearance of right apical mixed attenuation nodule compared with 08/02/2017. Etiology remains indeterminate. Given the increased density and size since 10/06/2014 described on 08/02/2017, indolent low-grade neoplasm cannot be excluded. 2.  Emphysema (ICD10-J43.9). 3.  Aortic Atherosclerosis (ICD10-I70.0). Electronically Signed   By: Kerby Moors M.D.   On: 01/23/2018 10:24     Past medical hx Past Medical History:  Diagnosis Date  . Anxiety   . Cancer ([place]) 04/04/2016   cancer of left tonsil  . CKD (chronic kidney disease), stage III ([place])   . COPD (chronic obstructive pulmonary disease) ([place])   . Hepatitis   . Hepatitis C    Treatment x 8 weeks 6-7 months ago [facility]  . History of radiation therapy 05/23/16- 07/10/16   Left Tonsil and bilateral neck  . Sinus congestion   . Stomach ulcer      Social History   Tobacco Use  . Smoking status: Current Some Day Smoker    Packs/day: 0.50    Years: 30.00    Pack years: 15.00    Types: Cigarettes  . Smokeless tobacco: Never Used  . Tobacco comment: states smoking 2 cigs per day as of 09/25/17  ep  Substance Use Topics  . Alcohol use: Yes    Comment: QOD  . Drug use: No    Types: Cocaine    [name] reports that he has been smoking cigarettes.  He has a 15.00 pack-year smoking history. He has never used smokeless tobacco. He reports that he drinks alcohol. He reports that he does not use drugs.  Tobacco Cessation: Current smoker  Past surgical hx, Family hx, Social hx all reviewed.  Current Outpatient Medications on File Prior  to Visit  Medication Sig  . albuterol (PROVENTIL HFA;VENTOLIN HFA) 108 (90 Base) MCG/ACT inhaler Inhale 2 puffs into the lungs every 4 (four) hours as needed for wheezing or shortness of breath.  Marland Kitchen aspirin EC 81 MG tablet Take 81 mg by mouth daily.  . pantoprazole (PROTONIX) 40 MG tablet Take 1 tablet (40 mg total) by mouth daily.   Current Facility-Administered Medications on File Prior to Visit  Medication  . LORazepam (ATIVAN) injection 0.5 mg     No Known Allergies  Review Of Systems:  Constitutional:   +  weight loss, No night sweats,  Fevers, chills, fatigue, or  lassitude.  HEENT:   No headaches,  Difficulty swallowing,  Tooth/dental problems, or  Sore throat,                No sneezing, itching, ear ache, nasal congestion, post nasal drip,   CV:  No chest pain,  Orthopnea, PND, swelling in lower extremities, anasarca, dizziness, palpitations, syncope.   GI  No heartburn, indigestion, abdominal pain, nausea, vomiting, diarrhea,  change in bowel habits, loss of appetite, bloody stools.   Resp: occasional  shortness of breath with exertion less at rest.  No excess mucus, no productive cough,  No non-productive cough,  No coughing up of blood.  No change in color of mucus.  No wheezing.  No chest wall deformity  Skin: no rash or lesions.  GU: no dysuria, change in color of urine, no urgency or frequency.  No flank pain, no hematuria   MS:  No joint pain or swelling.  No decreased range of motion.  No back pain.  Psych:  No change in mood or affect. No depression or anxiety.  No memory loss.   Vital Signs BP 128/80 (BP Location: Right Arm, Cuff Size: Normal)   Pulse 76   Ht 6' 1.5" (1.867 m)   Wt 132 lb (59.9 kg)   SpO2 99%   BMI 17.18 kg/m    Physical Exam:  General- No distress,  A&Ox3, pleasant ENT: No sinus tenderness, TM clear, pale nasal mucosa, no oral exudate,no post nasal drip, no LAN Cardiac: S1, S2, regular rate and rhythm, no murmur Chest: No wheeze/  rales/ dullness; no accessory muscle use, no nasal flaring, no sternal retractions Abd.: Soft Non-tender, ND, NT, BS + Ext: No clubbing cyanosis, edema Neuro:  normal strength Skin: No rashes, warm and dry Psych: normal mood and behavior   Assessment/Plan  Multiple lung nodules on CT Stable on CT 01/2018 Plan: We will plan a repeat CT scan in 6 months, unless Dr. Lamonte Sakai wants to do something different. Continue using Pro Air as needed for shortness of breath. Let us know if you want to start maintenance inhalers for your COPD Continue to work on quitting smoking. Continue omeprazole 40 mg twice daily as you have been doing. This is the single most powerful action you can take to decrease your risk of lung cancer, stroke and heart disease. Follow up with Dr. Lamonte Sakai in 6 months Please contact office for sooner follow up if symptoms do not improve or worsen or seek emergency care    COPD GOLD II Stable  Plan: Continue using Pro Air as needed for shortness of breath. Let us know if you want to start maintenance inhalers for your COPD Continue to work on quitting smoking. Continue omeprazole 40 mg twice daily as you have been doing. This is the single most powerful action you can take to decrease your risk of lung cancer, stroke and heart disease. Follow up with Dr. Lamonte Sakai in 6 months Please contact office for sooner follow up if symptoms do not improve or worsen or seek emergency care    Cigarette smoker I have spent 5 minutes counseling patient on smoking cessation this visit.    Magdalen Spatz, NP 02/04/2018  5:46 PM

## 2018-02-04 NOTE — Assessment & Plan Note (Signed)
Stable on CT 01/2018 Plan: We will plan a repeat CT scan in 6 months, unless Dr. Lamonte Sakai wants to do something different. Continue using Pro Air as needed for shortness of breath. Let us know if you want to start maintenance inhalers for your COPD Continue to work on quitting smoking. Continue omeprazole 40 mg twice daily as you have been doing. This is the single most powerful action you can take to decrease your risk of lung cancer, stroke and heart disease. Follow up with Dr. Lamonte Sakai in 6 months Please contact office for sooner follow up if symptoms do not improve or worsen or seek emergency care

## 2018-02-04 NOTE — Assessment & Plan Note (Signed)
Stable  Plan: Continue using Pro Air as needed for shortness of breath. Let us know if you want to start maintenance inhalers for your COPD Continue to work on quitting smoking. Continue omeprazole 40 mg twice daily as you have been doing. This is the single most powerful action you can take to decrease your risk of lung cancer, stroke and heart disease. Follow up with Dr. Lamonte Sakai in 6 months Please contact office for sooner follow up if symptoms do not improve or worsen or seek emergency care

## 2018-03-18 ENCOUNTER — Telehealth: Payer: Self-pay

## 2018-03-18 ENCOUNTER — Inpatient Hospital Stay: Payer: Medicaid Other | Admitting: Hematology and Oncology

## 2018-03-18 NOTE — Telephone Encounter (Signed)
Pt no show for 0930 appt this morning.  LVM on pt's cell phone to call and reschedule.

## 2018-03-19 ENCOUNTER — Telehealth: Payer: Self-pay | Admitting: Hematology and Oncology

## 2018-03-19 NOTE — Telephone Encounter (Signed)
Returned call to patient re rescheduling 7/15 f/u. Was not able to reach patient. Left message for patient to call to reschedule.

## 2018-03-22 ENCOUNTER — Telehealth: Payer: Self-pay | Admitting: Hematology and Oncology

## 2018-03-22 NOTE — Telephone Encounter (Signed)
Patient called to r/s office visit

## 2018-03-26 ENCOUNTER — Encounter: Payer: Self-pay | Admitting: Hematology and Oncology

## 2018-03-26 ENCOUNTER — Telehealth: Payer: Self-pay | Admitting: Hematology and Oncology

## 2018-03-26 ENCOUNTER — Inpatient Hospital Stay: Payer: Medicaid Other | Attending: Hematology and Oncology | Admitting: Hematology and Oncology

## 2018-03-26 DIAGNOSIS — Z7982 Long term (current) use of aspirin: Secondary | ICD-10-CM | POA: Diagnosis not present

## 2018-03-26 DIAGNOSIS — Z923 Personal history of irradiation: Secondary | ICD-10-CM | POA: Diagnosis not present

## 2018-03-26 DIAGNOSIS — F17210 Nicotine dependence, cigarettes, uncomplicated: Secondary | ICD-10-CM | POA: Diagnosis not present

## 2018-03-26 DIAGNOSIS — R918 Other nonspecific abnormal finding of lung field: Secondary | ICD-10-CM | POA: Diagnosis not present

## 2018-03-26 DIAGNOSIS — N183 Chronic kidney disease, stage 3 unspecified: Secondary | ICD-10-CM

## 2018-03-26 DIAGNOSIS — C090 Malignant neoplasm of tonsillar fossa: Secondary | ICD-10-CM

## 2018-03-26 DIAGNOSIS — Z79899 Other long term (current) drug therapy: Secondary | ICD-10-CM | POA: Diagnosis not present

## 2018-03-26 DIAGNOSIS — Z9221 Personal history of antineoplastic chemotherapy: Secondary | ICD-10-CM | POA: Diagnosis not present

## 2018-03-26 DIAGNOSIS — Z85858 Personal history of malignant neoplasm of other endocrine glands: Secondary | ICD-10-CM | POA: Insufficient documentation

## 2018-03-26 DIAGNOSIS — Z720 Tobacco use: Secondary | ICD-10-CM

## 2018-03-26 NOTE — Assessment & Plan Note (Signed)
Likely due to prior treatment His serum creatinine is stable I continue to encourage him increase fluids as tolerated

## 2018-03-26 NOTE — Progress Notes (Signed)
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  I reminded him of that We discussed the importance of tobacco cessation due to high risk of cancer recurrence Plan to see him back in 6 months for further follow-up  Multiple lung nodules on CT The patient have nonspecific lung nodules and had multiple evaluation by pulmonologist He is advised to quit smoking  Tobacco abuse The patient continues to smoke intermittently We discussed the importance of nicotine cessation  Chronic kidney disease, stage III (moderate) Likely due to prior treatment His serum creatinine is stable I continue to encourage him increase fluids as tolerated   No orders of the defined types were placed in this encounter.   INTERVAL HISTORY: Please see below for problem oriented charting. 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 Chronic small-vessel ischemic changes of the cerebral hemispheric white matter.       04/04/2016 Imaging    CT neck showed left tonsillar mass with diameter of 19 x 24 mm consistent with 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 necrosis. Suspicious level 5/supraclavicular nodes on the left. Scar type density in the right upper lobe is more prominent than was seen on a CT scan of the chest 10/06/2014      04/21/2016 PET scan    Asymmetric hypermetabolic soft tissue prominence in left palatine tonsil, consistent with known primary left tonsillar squamous cell Carcinoma. Left level 2 cervical lymphadenopathy, consistent with metastatic disease. No evidence of metastatic disease within the chest, abdomen, or pelvis. Stable 14 mm ground-glass and part solid nodular opacity in right lung apex compared to previous CT in 2016. This shows low-grade metabolic activity. Low-grade bronchogenic adenocarcinoma cannot be excluded. Consider surgical resection versus continued followup by CT in 12 months      05/17/2016 Procedure    Successful placement of a 20 French pull through gastrostomy tube & right IJ approach [place].      05/22/2016 - 07/03/2016 Chemotherapy    He received 3 doses of high dose cisplatin with radiation       05/23/2016 - 07/10/2016 Radiation Therapy    Rec'd Helical IMRT:  Left tonsil and bilateral neck / 70 Gy in 35 fractions to gross disease, 63 Gy in 35 fractions to high risk nodal echelons, and 56 Gy in 35 fractions to intermediate risk nodal echelons.          10/04/2016 - 10/07/2016 Hospital  Admission    He was admitted due to atypical chest pain from illicit drug use. CT scan showed possible atypical pneumonia versus radiation pneumonitis      10/04/2016 Imaging    Ct scan showed no evidence of pulmonary embolus. 2. Patchy bilateral airspace opacities, at the upper lobes, compatible with multifocal  pneumonia. This corresponds to the finding on recent chest radiograph. 3. Previously noted ground-glass and solid nodule at the right lung apex demonstrates increased solid components. The solid component now measures 1.2 cm. 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 Tissue diagnosis would be helpful, as deemed clinically appropriate.      10/26/2016 PET scan    Slight increasing solid component of a right apical lung lesion but no concerning hypermetabolism. Recommend continued surveillance. 2. Bilateral patchy apical airspace opacities are mild to moderately hypermetabolic and likely reflect infection. Recommend short-term followup noncontrast chest CT in 3 months to reassess. 3. No mediastinal or hilar mass or adenopathy. 4. No significant findings in the abdomen/pelvis. 5. Stable lucencies throughout the bony pelvis but no hypermetabolism to suggest metastasis.      11/13/2016 Procedure    Successful right IJ vein Port-A-Cath explant.      11/13/2016 Procedure    Uncomplicated gastrostomy removal      01/23/2017 Imaging    Ct neck: 1. Sequelae of radiation therapy in the neck. The oropharynx level of this neck CT is degraded by motion artifact. Still, soft tissue volume has definitely decreased at the level of the 2017 left tonsillar mass, suggesting positive response to treatment and concordant with the negative findings on the February PET-CT. 2. Normalized left level 2 metastatic lymph node. No abnormal cervical lymph nodes today. 3. Chest CT findings today are reported separately.      01/23/2017 Imaging    Ct chest: 1. Stable biapical pleural and parenchymal scarring changes with patchy airspace opacities and interstitial thickening most likely representing postinflammatory/postinfectious scarring changes. No new pulmonary lesions to suggest metastatic disease. Recommend six-month follow-up noncontrast chest CT to reassess. 2. Stable  emphysematous changes. 3. No mediastinal or hilar mass or adenopathy.      08/02/2017 Imaging    1. Irregular subsolid apical right upper lobe 1.8 cm pulmonary nodule is stable in size with slightly increased solid component in the interval. This nodule has increased in density and mildly increased in size since 10/06/2014 chest CT, which pre-dates the history of radiation therapy for the left tonsillar malignancy, which was diagnosed in August 2017. As such, an indolent/low grade primary bronchogenic adenocarcinoma remains on the differential. Continued surveillance is advised if surgical management or biopsy is not elected at this time. 2. Separate biapical lung opacities demonstrate changes suggestive of evolving postinflammatory change such as due to prior radiation therapy. 3. No thoracic adenopathy or other findings suggestive of metastatic disease in the chest.      09/17/2017 PET scan    1. No findings to suggest recurrent or metastatic head/neck primary. 2. Right apical mixed attenuation nodule is similar in size to most recent CT and similar in hypermetabolism compared to 10/26/2016. As on the recent CT, slow interval growth suggests indolent neoplasm such as low-grade adenocarcinoma. 3. Aortic Atherosclerosis (ICD10-I70.0). 4. Stable appearance of non FDG avid lucencies throughout the bony pelvis. These warrant ongoing follow-up attention. 5. Trace cul-de-sac fluid, new or increased since the prior PET.       REVIEW OF SYSTEMS:   Constitutional: Denies fevers, chills or abnormal  weight loss Eyes: Denies blurriness of vision Ears, nose, mouth, throat, and face: Denies mucositis or sore throat Respiratory: Denies cough, dyspnea or wheezes Cardiovascular: Denies palpitation, chest discomfort or lower extremity swelling Gastrointestinal:  Denies nausea, heartburn or change in bowel habits Skin: Denies abnormal skin rashes Lymphatics: Denies new lymphadenopathy or easy  bruising Neurological:Denies numbness, tingling or new weaknesses Behavioral/Psych: Mood is stable, no new changes  All other systems were reviewed with the patient and are negative.  I have reviewed the past medical history, past surgical history, social history and family history with the patient and they are unchanged from previous note.  ALLERGIES:  has No Known Allergies.  MEDICATIONS:  Current Outpatient Medications  Medication Sig Dispense Refill  . albuterol (PROVENTIL HFA;VENTOLIN HFA) 108 (90 Base) MCG/ACT inhaler Inhale 2 puffs into the lungs every 4 (four) hours as needed for wheezing or shortness of breath. 1 Inhaler 1  . aspirin EC 81 MG tablet Take 81 mg by mouth daily.    . pantoprazole (PROTONIX) 40 MG tablet Take 1 tablet (40 mg total) by mouth daily. 30 tablet 0   Current Facility-Administered Medications  Medication Dose Route Frequency Provider Last Rate Last Dose  . LORazepam (ATIVAN) injection 0.5 mg  0.5 mg Intravenous Once Alvy Bimler, Tehila Sokolow, MD        PHYSICAL EXAMINATION: ECOG PERFORMANCE STATUS: 1 - Symptomatic but completely ambulatory  Vitals:   03/26/18 0808  BP: 129/90  Pulse: (!) 53  Resp: 18  Temp: 98.1 F (36.7 C)  SpO2: 100%   Filed Weights   03/26/18 0808  Weight: 137 lb 4.8 oz (62.3 kg)    GENERAL:alert, no distress and comfortable SKIN: skin color, texture, turgor are normal, no rashes or significant lesions EYES: normal, Conjunctiva are pink and non-injected, sclera clear OROPHARYNX:no exudate, no erythema and lips, buccal mucosa, and tongue normal  NECK: supple, thyroid normal size, non-tender, without nodularity LYMPH:  no palpable lymphadenopathy in the cervical, axillary or inguinal LUNGS: clear to auscultation and percussion with normal breathing effort HEART: regular rate & rhythm and no murmurs and no lower extremity edema ABDOMEN:abdomen soft, non-tender and normal bowel sounds Musculoskeletal:no cyanosis of digits and no  clubbing  NEURO: alert & oriented x 3 with fluent speech, no focal motor/sensory deficits  LABORATORY DATA:  I have reviewed the data as listed    Component Value Date/Time   NA 133 (L) 01/14/2018 0937   NA 139 07/30/2017 0802   K 4.2 01/14/2018 0937   K 4.9 07/30/2017 0802   CL 99 (L) 01/14/2018 0937   CO2 23 01/14/2018 0937   CO2 27 07/30/2017 0802   GLUCOSE 95 01/14/2018 0937   GLUCOSE 97 07/30/2017 0802   BUN 13 01/14/2018 0937   BUN 21.0 07/30/2017 0802   CREATININE 1.42 (H) 01/14/2018 0937   CREATININE 1.6 (H) 07/30/2017 0802   CALCIUM 9.9 01/14/2018 0937   CALCIUM 10.1 07/30/2017 0802   PROT 7.6 09/17/2017 1529   PROT 8.0 07/30/2017 0802   ALBUMIN 4.2 09/17/2017 1529   ALBUMIN 4.3 07/30/2017 0802   AST 29 09/17/2017 1529   AST 27 07/30/2017 0802   ALT 16 09/17/2017 1529   ALT 16 07/30/2017 0802   ALKPHOS 60 09/17/2017 1529   ALKPHOS 64 07/30/2017 0802   BILITOT 0.6 09/17/2017 1529   BILITOT 0.52 07/30/2017 0802   GFRNONAA 53 (L) 01/14/2018 0937   GFRAA >60 01/14/2018 0937    No results found for: SPEP, UPEP  Lab Results  Component Value Date   WBC 5.1 01/14/2018   NEUTROABS 3.0 10/31/2017   HGB 12.9 (L) 01/14/2018   HCT 37.4 (L) 01/14/2018   MCV 94.4 01/14/2018   PLT 171 01/14/2018      Chemistry      Component Value Date/Time   NA 133 (L) 01/14/2018 0937   NA 139 07/30/2017 0802   K 4.2 01/14/2018 0937   K 4.9 07/30/2017 0802   CL 99 (L) 01/14/2018 0937   CO2 23 01/14/2018 0937   CO2 27 07/30/2017 0802   BUN 13 01/14/2018 0937   BUN 21.0 07/30/2017 0802   CREATININE 1.42 (H) 01/14/2018 0937   CREATININE 1.6 (H) 07/30/2017 0802      Component Value Date/Time   CALCIUM 9.9 01/14/2018 0937   CALCIUM 10.1 07/30/2017 0802   ALKPHOS 60 09/17/2017 1529   ALKPHOS 64 07/30/2017 0802   AST 29 09/17/2017 1529   AST 27 07/30/2017 0802   ALT 16 09/17/2017 1529   ALT 16 07/30/2017 0802   BILITOT 0.6 09/17/2017 1529   BILITOT 0.52 07/30/2017 0802       All questions were answered. The patient knows to call the clinic with any problems, questions or concerns. No barriers to learning was detected.  I spent 15 minutes counseling the patient face to face. The total time spent in the appointment was 20 minutes and more than 50% was on counseling and review of test results  Heath Lark, MD 03/26/2018 11:18 AM

## 2018-03-26 NOTE — Assessment & Plan Note (Signed)
Clinically, he has no signs of cancer recurrence He is due for repeat laryngoscopy and ENT follow-up in September.  I reminded him of that We discussed the importance of tobacco cessation due to high risk of cancer recurrence Plan to see him back in 6 months for further follow-up

## 2018-03-26 NOTE — Assessment & Plan Note (Signed)
The patient continues to smoke intermittently We discussed the importance of nicotine cessation

## 2018-03-26 NOTE — Assessment & Plan Note (Signed)
The patient have nonspecific lung nodules and had multiple evaluation by pulmonologist He is advised to quit smoking

## 2018-03-26 NOTE — Telephone Encounter (Signed)
gave avs and calendar °

## 2018-05-21 ENCOUNTER — Ambulatory Visit: Payer: Self-pay | Admitting: Emergency Medicine

## 2018-07-09 ENCOUNTER — Other Ambulatory Visit: Payer: Self-pay | Admitting: Family Medicine

## 2018-07-09 DIAGNOSIS — D649 Anemia, unspecified: Secondary | ICD-10-CM

## 2018-07-09 DIAGNOSIS — N189 Chronic kidney disease, unspecified: Secondary | ICD-10-CM

## 2018-07-19 ENCOUNTER — Ambulatory Visit
Admission: RE | Admit: 2018-07-19 | Discharge: 2018-07-19 | Disposition: A | Discharge status: Home / Self Care | Payer: Medicaid Other | Source: Ambulatory Visit | Attending: Family Medicine | Admitting: Family Medicine

## 2018-07-19 DIAGNOSIS — N189 Chronic kidney disease, unspecified: Secondary | ICD-10-CM

## 2018-07-19 DIAGNOSIS — D649 Anemia, unspecified: Secondary | ICD-10-CM

## 2018-07-22 ENCOUNTER — Other Ambulatory Visit: Payer: Self-pay

## 2018-07-22 ENCOUNTER — Other Ambulatory Visit: Payer: Self-pay | Admitting: Family Medicine

## 2018-07-22 DIAGNOSIS — M79604 Pain in right leg: Secondary | ICD-10-CM

## 2018-07-22 DIAGNOSIS — R918 Other nonspecific abnormal finding of lung field: Secondary | ICD-10-CM

## 2018-07-22 DIAGNOSIS — M79605 Pain in left leg: Principal | ICD-10-CM

## 2018-07-24 ENCOUNTER — Telehealth: Payer: Self-pay | Admitting: Acute Care

## 2018-07-24 NOTE — Telephone Encounter (Signed)
Called and spoke with patient, requesting to know when CT will be scheduled. Informed patient his CT is scheduled for 12.5.19 @ 11:00am. Gave patient CT number [phone]. Voiced understanding. Nothing further is needed at this time.

## 2018-07-26 ENCOUNTER — Ambulatory Visit
Admission: RE | Admit: 2018-07-26 | Discharge: 2018-07-26 | Disposition: A | Discharge status: Home / Self Care | Payer: Medicaid Other | Source: Ambulatory Visit | Attending: Family Medicine | Admitting: Family Medicine

## 2018-07-26 DIAGNOSIS — M79605 Pain in left leg: Principal | ICD-10-CM

## 2018-07-26 DIAGNOSIS — M79604 Pain in right leg: Secondary | ICD-10-CM

## 2018-08-05 ENCOUNTER — Ambulatory Visit: Payer: Self-pay | Admitting: Emergency Medicine

## 2018-08-08 ENCOUNTER — Inpatient Hospital Stay: Admission: RE | Admit: 2018-08-08 | Payer: Self-pay | Source: Ambulatory Visit

## 2018-08-13 ENCOUNTER — Other Ambulatory Visit (INDEPENDENT_AMBULATORY_CARE_PROVIDER_SITE_OTHER): Payer: Medicaid Other

## 2018-08-13 DIAGNOSIS — R918 Other nonspecific abnormal finding of lung field: Secondary | ICD-10-CM

## 2018-08-13 LAB — BASIC METABOLIC PANEL
BUN: 27 mg/dL — AB (ref 6–23)
CALCIUM: 9.9 mg/dL (ref 8.4–10.5)
CO2: 30 mEq/L (ref 19–32)
CREATININE: 1.37 mg/dL (ref 0.40–1.50)
Chloride: 105 mEq/L (ref 96–112)
GFR: 68.12 mL/min (ref >60.00)
Glucose, Bld: 86 mg/dL (ref 70–99)
Potassium: 4.6 mEq/L (ref 3.5–5.1)
Sodium: 140 mEq/L (ref 135–145)

## 2018-08-30 ENCOUNTER — Encounter (INDEPENDENT_AMBULATORY_CARE_PROVIDER_SITE_OTHER): Payer: Self-pay

## 2018-08-30 ENCOUNTER — Ambulatory Visit (INDEPENDENT_AMBULATORY_CARE_PROVIDER_SITE_OTHER)
Admission: RE | Admit: 2018-08-30 | Discharge: 2018-08-30 | Disposition: A | Discharge status: Home / Self Care | Payer: Medicaid Other | Source: Ambulatory Visit | Attending: Acute Care | Admitting: Acute Care

## 2018-08-30 DIAGNOSIS — R918 Other nonspecific abnormal finding of lung field: Secondary | ICD-10-CM

## 2018-08-30 MED ORDER — IOPAMIDOL (ISOVUE-300) INJECTION 61%
80.0000 mL | Freq: Once | INTRAVENOUS | Status: AC | PRN
  Administered 2018-08-30: 80 mL via INTRAVENOUS

## 2018-09-02 ENCOUNTER — Ambulatory Visit: Payer: Self-pay | Admitting: Emergency Medicine

## 2018-09-06 ENCOUNTER — Telehealth: Payer: Self-pay | Admitting: Internal Medicine

## 2018-09-06 NOTE — Telephone Encounter (Signed)
Notes recorded by Magdalen Spatz, NP on 09/05/2018 at 11:44 AM EST [name] [name], Please call patient and let him know his CT chest showed stable pulmonary nodules , and stable pulmonary changes to his right lung base.  He has an appointment with Dr. Lamonte Sakai 09/12/2017 to go over the results further and determine follow up imaging schedule at the [facility]. Thanks so much.   Called and spoke with pt letting him know the results of the CT and stated to him that [name] will go over results in full detail at upcoming [place]. Pt expressed understanding. Nothing further needed.

## 2018-09-12 ENCOUNTER — Ambulatory Visit (INDEPENDENT_AMBULATORY_CARE_PROVIDER_SITE_OTHER): Payer: Medicaid Other | Admitting: Emergency Medicine

## 2018-09-12 ENCOUNTER — Telehealth: Payer: Self-pay | Admitting: Emergency Medicine

## 2018-09-12 ENCOUNTER — Encounter: Payer: Self-pay | Admitting: Emergency Medicine

## 2018-09-12 DIAGNOSIS — J449 Chronic obstructive pulmonary disease, unspecified: Secondary | ICD-10-CM

## 2018-09-12 DIAGNOSIS — J209 Acute bronchitis, unspecified: Secondary | ICD-10-CM

## 2018-09-12 DIAGNOSIS — F17210 Nicotine dependence, cigarettes, uncomplicated: Secondary | ICD-10-CM | POA: Diagnosis not present

## 2018-09-12 DIAGNOSIS — R918 Other nonspecific abnormal finding of lung field: Secondary | ICD-10-CM | POA: Diagnosis not present

## 2018-09-12 MED ORDER — AZITHROMYCIN 250 MG PO TABS: ORAL_TABLET | ORAL | 0 refills | Status: DC

## 2018-09-12 NOTE — Assessment & Plan Note (Signed)
Currently on albuterol as needed.  I think he has an acute bronchitis following a recent upper respiratory infection.  He is not wheezing on exam.  I like to treat him with azithromycin, defer steroids at this time.

## 2018-09-12 NOTE — Progress Notes (Signed)
Subjective:    Patient ID: [name] [name], male    DOB: October 24, 1955, 63 y.o.   MRN: [id number]  HPI 63 year old active smoker (25 pack years) with a history of tonsillar cancer with recurrence that has been treated by Dr Alvy Bimler and with XRT.  As part of his serial imaging and evaluation for his tonsillar cancel a groundglass apical right upper lobe nodule was identified dating back to 2016.  He is referred today for further evaluation of the pulmonary.  I reviewed all of his imaging including serial CT scans and ultimately a PET scan done on 09/17/17.  The mixed attenuation nodule now measures 1.8 cm in its largest dimension with some extremely mild hypermetabolism (SUV 2.0).  This is slightly larger than his imaging from 1 year ago.  Her reports that he feels well, does had some pain at his prior port site. He is not having any current dyspnea. He has symbicort but is no longer using every day. He has albuterol that he uses about 4x a week. He denies any cough. No wheeze. He denies any fevers. He did lose weight when he was treated for his CA.   He underwent pulmonary function testing today that I have reviewed.  This shows evidence for mild obstruction, FEV1 2.92 or 83% predicted.  No significant bronchodilator response.  Normal lung volumes.  Decreased diffusion capacity that does not completely correct when adjusted for his alveolar volume.  ROV 09/12/18 --[name] [name] is a 63 year old gentleman, smoker (not every day) with a history of tonsillar cancer and recurrence with XRT.  He had multiple pulmonary nodules on CT scan of the chest including a dominant irregularly-shaped right upper lobe nodule with groundglass and solid components, no hypermetabolism.  This was stable in May 2019.  A repeat scan was done 08/30/2018 which I reviewed and shows that the right apical nodular density is unchanged, 18 x 9 mm as it is an adjacent small pulmonary nodule 3.5 mm.  He has stable biapical pleural and parenchymal  scarring.  He reports that he has been experiencing URI sx for about 5 days. He is starting to cough up brown. No fever. He has albuterol, uses rarely. Has helped him last few days with mucous clearance.     Review of Systems  Constitutional: Negative for fever and unexpected weight change.  HENT: Negative for congestion, dental problem, ear pain, nosebleeds, postnasal drip, rhinorrhea, sinus pressure, sneezing, sore throat and trouble swallowing.   Eyes: Negative for redness and itching.  Respiratory: Negative for cough, chest tightness, shortness of breath and wheezing.   Cardiovascular: Negative for palpitations and leg swelling.  Gastrointestinal: Negative for nausea and vomiting.  Genitourinary: Negative for dysuria.  Musculoskeletal: Negative for joint swelling.  Skin: Negative for rash.  Neurological: Negative for headaches.  Hematological: Does not bruise/bleed easily.  Psychiatric/Behavioral: Negative for dysphoric mood. The patient is not nervous/anxious.     Past Medical History:  Diagnosis Date  . Anxiety   . Cancer ([place]) 04/04/2016   cancer of left tonsil  . CKD (chronic kidney disease), stage III ([place])   . COPD (chronic obstructive pulmonary disease) ([place])   . Hepatitis   . Hepatitis C    Treatment x 8 weeks 6-7 months ago [facility]  . History of radiation therapy 05/23/16- 07/10/16   Left Tonsil and bilateral neck  . Sinus congestion   . Stomach ulcer      Family History  Problem Relation Age of Onset  . Hypertension  Mother   . Anxiety disorder Mother   . Diabetes Mother   . Cancer Father      Social History   Socioeconomic History  . Marital status: Single    Spouse name: Not on file  . Number of children: 3  . Years of education: Not on file  . Highest education level: Not on file  Occupational History  . Occupation: disabled  Social Needs  . Financial resource strain: Not on file  . Food insecurity:    Worry: Not on file    Inability: Not on file   . Transportation needs:    Medical: Not on file    Non-medical: Not on file  Tobacco Use  . Smoking status: Current Some Day Smoker    Packs/day: 0.50    Years: 30.00    Pack years: 15.00    Types: Cigarettes  . Smokeless tobacco: Never Used  . Tobacco comment: says he hasn't smoked in 1 week. 09/12/2018  Substance and Sexual Activity  . Alcohol use: Yes    Comment: QOD  . Drug use: No    Types: Cocaine  . Sexual activity: Yes  Lifestyle  . Physical activity:    Days per week: Not on file    Minutes per session: Not on file  . Stress: Not on file  Relationships  . Social connections:    Talks on phone: Not on file    Gets together: Not on file    Attends religious service: Not on file    Active member of club or organization: Not on file    Attends meetings of clubs or organizations: Not on file    Relationship status: Not on file  . Intimate partner violence:    Fear of current or ex partner: Not on file    Emotionally abused: Not on file    Physically abused: Not on file    Forced sexual activity: Not on file  Other Topics Concern  . Not on file  Social History Narrative   Lives with mother.    Mother is disabled with mental illness. He is the principal care giver   Has 3 children.   [name] is an important contact   He does not drive. Takes a bus. Ride a bike.   He was exposed to TB 20 yrs ago - his cousin.  He has lived in [facility] No [employer]  Has worked Architect. No welding or woodworking, no asbestos.   No Known Allergies   Outpatient Medications Prior to Visit  Medication Sig Dispense Refill  . albuterol (PROVENTIL HFA;VENTOLIN HFA) 108 (90 Base) MCG/ACT inhaler Inhale 2 puffs into the lungs every 4 (four) hours as needed for wheezing or shortness of breath. 1 Inhaler 1  . aspirin EC 81 MG tablet Take 81 mg by mouth daily.    . pantoprazole (PROTONIX) 40 MG tablet Take 1 tablet (40 mg total) by mouth daily. 30 tablet 0   Facility-Administered  Medications Prior to Visit  Medication Dose Route Frequency Provider Last Rate Last Dose  . LORazepam (ATIVAN) injection 0.5 mg  0.5 mg Intravenous Once Heath Lark, MD            Objective:   Physical Exam Vitals:   09/12/18 1201  BP: 108/66  Pulse: 63  SpO2: 96%  Weight: 131 lb 9.6 oz (59.7 kg)  Height: 6' 1.5" (1.867 m)   Gen: Pleasant, thin gentleman, in no distress,  normal affect  ENT: No lesions,  mouth clear,  oropharynx clear, no postnasal drip  Neck: No JVD, no stridor  Lungs: No use of accessory muscles, clear bilaterally, no wheezing, no crackles  Cardiovascular: RRR, heart sounds normal, no murmur or gallops, no peripheral edema  Musculoskeletal: No deformities, no cyanosis or clubbing  Neuro: alert, non focal  Skin: Warm, no lesions or rash     Assessment & Plan:  COPD GOLD II Currently on albuterol as needed.  I think he has an acute bronchitis following a recent upper respiratory infection.  He is not wheezing on exam.  I like to treat him with azithromycin, defer steroids at this time.  Acute bronchitis In the setting of a viral upper respiratory infection, positive sick contact.  Treated with azithromycin.  As above no indication currently for steroids.  Cigarette smoker Discussed cessation with him today.  He actually has not had a cigarette since the beginning of the year.  I congratulated him on this.  Multiple lung nodules on CT His pulmonary nodules are unchanged compared with May 2019.  We will plan to repeat in June 2020  Baltazar Apo, MD, PhD 09/12/2018, 12:56 PM [facility] [phone] or if no answer [phone]

## 2018-09-12 NOTE — Assessment & Plan Note (Signed)
His pulmonary nodules are unchanged compared with May 2019.  We will plan to repeat in June 2020

## 2018-09-12 NOTE — Patient Instructions (Signed)
Your CT scan of the chest shows that your right upper lobe pulmonary nodule is stable in appearance and size.  This is good news.  We will repeat your CT without contrast in June 2020 to confirm that it is not changing. Please continue your albuterol 2 puffs if needed for shortness of breath, wheezing, chest tightness. Please take azithromycin until completely gone.  Follow with Dr Lamonte Sakai in June 2020 after your CT scan, or sooner if you have any other problems.

## 2018-09-12 NOTE — Assessment & Plan Note (Signed)
Discussed cessation with him today.  He actually has not had a cigarette since the beginning of the year.  I congratulated him on this.

## 2018-09-12 NOTE — Telephone Encounter (Signed)
.  Spoke with pt, advised him that [place] meant to send in a Zpak. I advised pt that I would send in the Rx. Pt understood and nothing further is needed.   Instructions   Your CT scan of the chest shows that your right upper lobe pulmonary nodule is stable in appearance and size.  This is good news.  We will repeat your CT without contrast in June 2020 to confirm that it is not changing. Please continue your albuterol 2 puffs if needed for shortness of breath, wheezing, chest tightness. Please take azithromycin until completely gone.  Follow with Dr Lamonte Sakai in June 2020 after your CT scan, or sooner if you have any other problems.       After Visit Summary (Printed 09/12/2018)

## 2018-09-12 NOTE — Assessment & Plan Note (Signed)
In the setting of a viral upper respiratory infection, positive sick contact.  Treated with azithromycin.  As above no indication currently for steroids.

## 2018-09-25 ENCOUNTER — Other Ambulatory Visit: Payer: Self-pay

## 2018-09-25 DIAGNOSIS — C090 Malignant neoplasm of tonsillar fossa: Secondary | ICD-10-CM

## 2018-09-26 ENCOUNTER — Inpatient Hospital Stay (HOSPITAL_BASED_OUTPATIENT_CLINIC_OR_DEPARTMENT_OTHER): Payer: Medicaid Other | Admitting: Hematology and Oncology

## 2018-09-26 ENCOUNTER — Encounter: Payer: Self-pay | Admitting: Hematology and Oncology

## 2018-09-26 ENCOUNTER — Telehealth: Payer: Self-pay | Admitting: Hematology and Oncology

## 2018-09-26 ENCOUNTER — Other Ambulatory Visit: Payer: Self-pay | Admitting: Hematology and Oncology

## 2018-09-26 ENCOUNTER — Inpatient Hospital Stay: Payer: Medicaid Other | Attending: Hematology and Oncology

## 2018-09-26 DIAGNOSIS — Z85858 Personal history of malignant neoplasm of other endocrine glands: Secondary | ICD-10-CM

## 2018-09-26 DIAGNOSIS — Z7982 Long term (current) use of aspirin: Secondary | ICD-10-CM | POA: Diagnosis not present

## 2018-09-26 DIAGNOSIS — R5383 Other fatigue: Secondary | ICD-10-CM

## 2018-09-26 DIAGNOSIS — Z79899 Other long term (current) drug therapy: Secondary | ICD-10-CM

## 2018-09-26 DIAGNOSIS — J439 Emphysema, unspecified: Secondary | ICD-10-CM | POA: Insufficient documentation

## 2018-09-26 DIAGNOSIS — R918 Other nonspecific abnormal finding of lung field: Secondary | ICD-10-CM | POA: Diagnosis not present

## 2018-09-26 DIAGNOSIS — Z923 Personal history of irradiation: Secondary | ICD-10-CM | POA: Insufficient documentation

## 2018-09-26 DIAGNOSIS — N183 Chronic kidney disease, stage 3 unspecified: Secondary | ICD-10-CM

## 2018-09-26 DIAGNOSIS — C090 Malignant neoplasm of tonsillar fossa: Secondary | ICD-10-CM

## 2018-09-26 DIAGNOSIS — Z9221 Personal history of antineoplastic chemotherapy: Secondary | ICD-10-CM | POA: Insufficient documentation

## 2018-09-26 DIAGNOSIS — D638 Anemia in other chronic diseases classified elsewhere: Secondary | ICD-10-CM | POA: Insufficient documentation

## 2018-09-26 LAB — CBC WITH DIFFERENTIAL (CANCER CENTER ONLY)
Abs Immature Granulocytes: 0.02 10*3/uL (ref 0.00–0.07)
BASOS ABS: 0 10*3/uL (ref 0.0–0.1)
Basophils Relative: 0 %
Eosinophils Absolute: 0.2 10*3/uL (ref 0.0–0.5)
Eosinophils Relative: 3 %
HCT: 34.2 % — ABNORMAL LOW (ref 39.0–52.0)
Hemoglobin: 11.8 g/dL — ABNORMAL LOW (ref 13.0–17.0)
Immature Granulocytes: 0 %
Lymphocytes Relative: 21 %
Lymphs Abs: 1 10*3/uL (ref 0.7–4.0)
MCH: 32.5 pg (ref 26.0–34.0)
MCHC: 34.5 g/dL (ref 30.0–36.0)
MCV: 94.2 fL (ref 80.0–100.0)
Monocytes Absolute: 0.7 10*3/uL (ref 0.1–1.0)
Monocytes Relative: 14 %
Neutro Abs: 3 10*3/uL (ref 1.7–7.7)
Neutrophils Relative %: 62 %
Platelet Count: 204 10*3/uL (ref 150–400)
RBC: 3.63 MIL/uL — ABNORMAL LOW (ref 4.22–5.81)
RDW: 13.2 % (ref 11.5–15.5)
WBC Count: 5 10*3/uL (ref 4.0–10.5)
nRBC: 0 % (ref 0.0–0.2)

## 2018-09-26 LAB — CMP (CANCER CENTER ONLY)
ALT: 22 U/L (ref 0–44)
AST: 39 U/L (ref 15–41)
Albumin: 4 g/dL (ref 3.5–5.0)
Alkaline Phosphatase: 58 U/L (ref 38–126)
Anion gap: 9 (ref 5–15)
BUN: 20 mg/dL (ref 6–20)
CO2: 24 mmol/L (ref 22–32)
Calcium: 9.3 mg/dL (ref 8.9–10.3)
Chloride: 107 mmol/L (ref 98–111)
Creatinine: 1.63 mg/dL — ABNORMAL HIGH (ref 0.61–1.24)
GFR, EST AFRICAN AMERICAN: 52 mL/min — AB (ref >60)
GFR, Estimated: 45 mL/min — ABNORMAL LOW (ref >60)
Glucose, Bld: 87 mg/dL (ref 70–99)
Potassium: 4 mmol/L (ref 3.5–5.1)
Sodium: 140 mmol/L (ref 135–145)
Total Bilirubin: 0.3 mg/dL (ref 0.3–1.2)
Total Protein: 7.3 g/dL (ref 6.5–8.1)

## 2018-09-26 LAB — TSH: TSH: 1.693 u[IU]/mL (ref 0.320–4.118)

## 2018-09-26 NOTE — Assessment & Plan Note (Signed)
Likely due to prior treatment His serum creatinine is stable

## 2018-09-26 NOTE — Assessment & Plan Note (Signed)
He has close follow-up with pulmonary clinic His last CT imaging from December show no evidence of disease I would defer to them for further follow-up

## 2018-09-26 NOTE — Assessment & Plan Note (Signed)
He has no signs or symptoms to suggest cancer recurrence However, I am concerned about his smoking and drinking habit that could predispose him to recurrent cancer or different malignancies We discussed the importance of nicotine cessation and staying abstinent from alcohol He will continue close follow-up with ENT physician in a few months I will see him back once a year for further follow-up

## 2018-09-26 NOTE — Telephone Encounter (Signed)
Gave avs and calendar ° °

## 2018-09-26 NOTE — Assessment & Plan Note (Signed)
This is likely anemia of chronic disease. The patient denies recent history of bleeding such as epistaxis, hematuria or hematochezia. He is asymptomatic from the anemia. We will observe for now.  

## 2018-09-26 NOTE — Assessment & Plan Note (Signed)
The patient has mild fatigue He has been exposed to radiation I will check thyroid function test once a year.

## 2018-09-26 NOTE — Progress Notes (Signed)
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[facility]) He has no signs or symptoms to suggest cancer recurrence However, I am concerned about his smoking and drinking habit that could predispose him to recurrent cancer or different malignancies We discussed the importance of nicotine cessation and staying abstinent from alcohol He will continue close follow-up with ENT physician in a few months I will see him back once a year for further follow-up  Multiple lung nodules on CT He has close follow-up with pulmonary clinic His last CT imaging from December show no evidence of disease I would defer to them for further follow-up  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 The patient denies recent history of bleeding such as epistaxis, hematuria or hematochezia. He is asymptomatic from the anemia. We will observe for now.    Other fatigue The patient has mild fatigue He has been exposed to radiation I will check thyroid function test once a year.  Chronic kidney disease, stage III (moderate) Likely due to prior treatment His serum creatinine is stable   Orders Placed This Encounter  Procedures  . CBC with Differential/Platelet    Standing Status:   Future    Standing Expiration Date:   10/31/2019  . Comprehensive metabolic panel    Standing Status:   Future    Standing Expiration Date:   10/31/2019  . TSH    Standing Status:   Future    Standing Expiration Date:   10/31/2019     INTERVAL HISTORY: Please see below for problem oriented charting. He returns for further follow-up He feels well He continues to smoke and drink periodically No recent dental issue No swallowing difficulties He has occasional cough and follows closely at the pulmonary clinic.  No new neck masses  SUMMARY OF ONCOLOGIC HISTORY:   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[place])   04/04/2016 Initial Diagnosis    He saw ENT and had laryngoscopy and biopsy of left tonsil    04/04/2016 Pathology Results    [id number] biopsy showed invasive moderately differentiated squamous cell carcinoma    04/04/2016 Imaging    MR brain showed no acute intracranial finding. Chronic small-vessel ischemic changes of the cerebral hemispheric white matter.     04/04/2016 Imaging    CT neck showed left tonsillar mass with diameter of 19 x 24 mm consistent with 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 necrosis. Suspicious level 5/supraclavicular nodes on the left. Scar type density in the right upper lobe is more prominent than was seen on a CT scan of the chest 10/06/2014    04/21/2016 PET scan    Asymmetric hypermetabolic soft tissue prominence in left palatine tonsil, consistent with known primary left tonsillar squamous cell Carcinoma. Left level 2 cervical lymphadenopathy, consistent with metastatic disease. No evidence of metastatic disease within the chest, abdomen, or pelvis. Stable 14 mm ground-glass and part solid nodular opacity in right lung apex compared to previous CT in 2016. This shows low-grade metabolic activity. Low-grade bronchogenic adenocarcinoma cannot be excluded. Consider surgical resection versus continued followup  by CT in 12 months    05/17/2016 Procedure    Successful placement of a 20 French pull through gastrostomy tube & right IJ approach [facility] [place].    05/22/2016 - 07/03/2016 Chemotherapy    He received 3 doses of high dose cisplatin with radiation      05/23/2016 - 07/10/2016 Radiation Therapy    Rec'd Helical IMRT:  Left tonsil and bilateral neck / 70 Gy in 35 fractions to gross disease, 63 Gy in 35 fractions to high risk nodal echelons, and 56 Gy in 35 fractions to intermediate risk nodal echelons.        10/04/2016 - 10/07/2016 Hospital Admission    He was admitted due to atypical chest pain from illicit drug use. CT scan showed possible atypical pneumonia versus radiation pneumonitis    10/04/2016 Imaging    Ct scan showed no evidence of pulmonary embolus. 2. Patchy bilateral airspace opacities, at the upper lobes, compatible with multifocal pneumonia. This corresponds to the finding on recent chest radiograph. 3. Previously noted ground-glass and solid nodule at the right lung apex demonstrates increased solid components. The solid component now measures 1.2 cm. 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 Tissue diagnosis would be helpful, as deemed clinically appropriate.    10/26/2016 PET scan    Slight increasing solid component of a right apical lung lesion but no concerning hypermetabolism. Recommend continued surveillance. 2. Bilateral patchy apical airspace opacities are mild to moderately hypermetabolic and likely reflect infection. Recommend short-term followup noncontrast chest CT in 3 months to reassess. 3. No mediastinal or hilar mass or adenopathy. 4. No significant findings in the abdomen/pelvis. 5. Stable lucencies throughout the bony pelvis but no hypermetabolism to suggest metastasis.    11/13/2016 Procedure    Successful right IJ vein Port-A-Cath explant.    11/13/2016 Procedure    Uncomplicated gastrostomy removal    01/23/2017 Imaging    Ct neck: 1. Sequelae of radiation therapy in the neck. The oropharynx level of this neck CT is degraded by motion artifact. Still, soft tissue volume has definitely decreased at the level of the 2017 left tonsillar mass, suggesting positive  response to treatment and concordant with the negative findings on the February PET-CT. 2. Normalized left level 2 metastatic lymph node. No abnormal cervical lymph nodes today. 3. Chest CT findings today are reported separately.    01/23/2017 Imaging    Ct chest: 1. Stable biapical pleural and parenchymal scarring changes with patchy airspace opacities and interstitial thickening most likely representing postinflammatory/postinfectious scarring changes. No new pulmonary lesions to suggest metastatic disease. Recommend six-month follow-up noncontrast chest CT to reassess. 2. Stable emphysematous changes. 3. No mediastinal or hilar mass or adenopathy.    08/02/2017 Imaging    1. Irregular subsolid apical right upper lobe 1.8 cm pulmonary nodule is stable in size with slightly increased solid component in the interval. This nodule has increased in density and mildly increased in size since 10/06/2014 chest CT, which pre-dates the history of radiation therapy for the left tonsillar malignancy, which was diagnosed in August 2017. As such, an indolent/low grade primary bronchogenic adenocarcinoma remains on the differential. Continued surveillance is advised if surgical management or biopsy is not elected at this time. 2. Separate biapical lung opacities demonstrate changes suggestive of evolving postinflammatory change such as due to prior radiation therapy. 3. No thoracic adenopathy or other findings suggestive of metastatic disease in the chest.    09/17/2017 PET scan  1. No findings to suggest recurrent or metastatic head/neck primary. 2. Right apical mixed attenuation nodule is similar in size to most recent CT and similar in hypermetabolism compared to 10/26/2016. As on the recent CT, slow interval growth suggests indolent neoplasm such as low-grade adenocarcinoma. 3. Aortic Atherosclerosis (ICD10-I70.0). 4. Stable appearance of non FDG avid lucencies throughout the bony pelvis. These warrant ongoing  follow-up attention. 5. Trace cul-de-sac fluid, new or increased since the prior PET.    08/30/2018 Imaging    1. Stable biapical pleural and parenchymal scarring changes with stable nodular density at the right lung apex. No new or progressive or worrisome findings. Recommend follow-up noncontrast chest CT in 12 months. 2. Stable emphysematous changes. No acute pulmonary findings or new pulmonary nodules. 3. No mediastinal or hilar mass or adenopathy.      REVIEW OF SYSTEMS:   Constitutional: Denies fevers, chills or abnormal weight loss Eyes: Denies blurriness of vision Ears, nose, mouth, throat, and face: Denies mucositis or sore throat Cardiovascular: Denies palpitation, chest discomfort or lower extremity swelling Gastrointestinal:  Denies nausea, heartburn or change in bowel habits Skin: Denies abnormal skin rashes Lymphatics: Denies new lymphadenopathy or easy bruising Neurological:Denies numbness, tingling or new weaknesses Behavioral/Psych: Mood is stable, no new changes  All other systems were reviewed with the patient and are negative.  I have reviewed the past medical history, past surgical history, social history and family history with the patient and they are unchanged from previous note.  ALLERGIES:  has No Known Allergies.  MEDICATIONS:  Current Outpatient Medications  Medication Sig Dispense Refill  . albuterol (PROVENTIL HFA;VENTOLIN HFA) 108 (90 Base) MCG/ACT inhaler Inhale 2 puffs into the lungs every 4 (four) hours as needed for wheezing or shortness of breath. 1 Inhaler 1  . aspirin EC 81 MG tablet Take 81 mg by mouth daily.     Current Facility-Administered Medications  Medication Dose Route Frequency Provider Last Rate Last Dose  . LORazepam (ATIVAN) injection 0.5 mg  0.5 mg Intravenous Once Alvy Bimler, Browning [place], MD        PHYSICAL EXAMINATION: ECOG PERFORMANCE STATUS: 1 - Symptomatic but completely ambulatory  Vitals:   09/26/18 0828  BP: 120/85  Pulse:  62  Resp: 18  Temp: 98.1 F (36.7 C)  SpO2: 100%   Filed Weights   09/26/18 0828  Weight: 140 lb (63.5 kg)    GENERAL:alert, no distress and comfortable SKIN: skin color, texture, turgor are normal, no rashes or significant lesions EYES: normal, Conjunctiva are pink and non-injected, sclera clear OROPHARYNX:no exudate, no erythema and lips, buccal mucosa, and tongue normal  NECK: supple, thyroid normal size, non-tender, without nodularity LYMPH:  no palpable lymphadenopathy in the cervical, axillary or inguinal LUNGS: clear to auscultation and percussion with normal breathing effort HEART: regular rate & rhythm and no murmurs and no lower extremity edema ABDOMEN:abdomen soft, non-tender and normal bowel sounds Musculoskeletal:no cyanosis of digits and no clubbing  NEURO: alert & oriented x 3 with fluent speech, no focal motor/sensory deficits  LABORATORY DATA:  I have reviewed the data as listed    Component Value Date/Time   NA 140 09/26/2018 0809   NA 139 07/30/2017 0802   K 4.0 09/26/2018 0809   K 4.9 07/30/2017 0802   CL 107 09/26/2018 0809   CO2 24 09/26/2018 0809   CO2 27 07/30/2017 0802   GLUCOSE 87 09/26/2018 0809   GLUCOSE 97 07/30/2017 0802   BUN 20 09/26/2018 0809   BUN 21.0 07/30/2017  0802   CREATININE 1.63 (H) 09/26/2018 0809   CREATININE 1.6 (H) 07/30/2017 0802   CALCIUM 9.3 09/26/2018 0809   CALCIUM 10.1 07/30/2017 0802   PROT 7.3 09/26/2018 0809   PROT 8.0 07/30/2017 0802   ALBUMIN 4.0 09/26/2018 0809   ALBUMIN 4.3 07/30/2017 0802   AST 39 09/26/2018 0809   AST 27 07/30/2017 0802   ALT 22 09/26/2018 0809   ALT 16 07/30/2017 0802   ALKPHOS 58 09/26/2018 0809   ALKPHOS 64 07/30/2017 0802   BILITOT 0.3 09/26/2018 0809   BILITOT 0.52 07/30/2017 0802   GFRNONAA 45 (L) 09/26/2018 0809   GFRAA 52 (L) 09/26/2018 0809    No results found for: SPEP, UPEP  Lab Results  Component Value Date   WBC 5.0 09/26/2018   NEUTROABS 3.0 09/26/2018   HGB 11.8  (L) 09/26/2018   HCT 34.2 (L) 09/26/2018   MCV 94.2 09/26/2018   PLT 204 09/26/2018      Chemistry      Component Value Date/Time   NA 140 09/26/2018 0809   NA 139 07/30/2017 0802   K 4.0 09/26/2018 0809   K 4.9 07/30/2017 0802   CL 107 09/26/2018 0809   CO2 24 09/26/2018 0809   CO2 27 07/30/2017 0802   BUN 20 09/26/2018 0809   BUN 21.0 07/30/2017 0802   CREATININE 1.63 (H) 09/26/2018 0809   CREATININE 1.6 (H) 07/30/2017 0802      Component Value Date/Time   CALCIUM 9.3 09/26/2018 0809   CALCIUM 10.1 07/30/2017 0802   ALKPHOS 58 09/26/2018 0809   ALKPHOS 64 07/30/2017 0802   AST 39 09/26/2018 0809   AST 27 07/30/2017 0802   ALT 22 09/26/2018 0809   ALT 16 07/30/2017 0802   BILITOT 0.3 09/26/2018 0809   BILITOT 0.52 07/30/2017 0802       RADIOGRAPHIC STUDIES: I have personally reviewed the radiological images as listed and agreed with the findings in the report. Ct Chest W Contrast  Result Date: 08/30/2018 CLINICAL DATA:  History of throat cancer. Followup pulmonary nodules. EXAM: CT CHEST WITH CONTRAST TECHNIQUE: Multidetector CT imaging of the chest was performed during intravenous contrast administration. CONTRAST:  33mL ISOVUE-300 IOPAMIDOL (ISOVUE-300) INJECTION 61% COMPARISON:  PET-CT 09/17/2017 and chest CT 01/23/2018 FINDINGS: Cardiovascular: The heart is normal in size. No pericardial effusion. The aorta is normal in caliber. No dissection or atherosclerotic calcifications. The branch vessels are patent. No coronary artery calcifications. Mediastinum/Nodes: No mediastinal or hilar mass or lymphadenopathy. Small scattered lymph nodes are stable. The esophagus is normal. Lungs/Pleura: Stable biapical pleural and parenchymal scarring changes. The right apical nodular density is unchanged measuring 18 x 9 mm. Small adjacent pulmonary is also stable measuring 3.5 mm. Stable emphysematous changes. No acute pulmonary infiltrates or pleural effusion. Upper Abdomen: No  significant upper abdominal findings. Musculoskeletal: No chest wall mass, supraclavicular or axillary adenopathy. The bony thorax is normal. No worrisome bone lesions. IMPRESSION: 1. Stable biapical pleural and parenchymal scarring changes with stable nodular density at the right lung apex. No new or progressive or worrisome findings. Recommend follow-up noncontrast chest CT in 12 months. 2. Stable emphysematous changes. No acute pulmonary findings or new pulmonary nodules. 3. No mediastinal or hilar mass or adenopathy. Emphysema (ICD10-J43.9). Electronically Signed   By: Marijo Sanes M.D.   On: 08/30/2018 12:29    All questions were answered. The patient knows to call the clinic with any problems, questions or concerns. No barriers to learning was detected.  I  spent 15 minutes counseling the patient face to face. The total time spent in the appointment was 20 minutes and more than 50% was on counseling and review of test results  Heath Lark, MD 09/26/2018 9:56 AM

## 2018-12-17 ENCOUNTER — Telehealth: Payer: Self-pay | Admitting: Emergency Medicine

## 2018-12-17 ENCOUNTER — Telehealth: Payer: Self-pay | Admitting: *Deleted

## 2018-12-17 DIAGNOSIS — R0781 Pleurodynia: Secondary | ICD-10-CM

## 2018-12-17 NOTE — Telephone Encounter (Signed)
Call returned to patient, he states he has a spot on his lungs and for the last 4-5 days he has been having back pain when he coughs. Denies fever, he states he has to make himself cough to feel the pain. No recent travel. He has not been around anyone sick. He has been obeying the state at home order. Denies all other symptoms. He states when he bends over he can feel the soreness in his chest. I inquired as to whether he has a PCP. He states he does but he trusts our opinion. He is requesting a CXR. Informed patient we would recommendations from a provider and give him a call back.   Beth please advise if this patient needs a Tele-Visit.

## 2018-12-17 NOTE — Telephone Encounter (Signed)
Call returned to patient, made aware of EW recommendations.  Denies any problems with GI bleeding. Order for CXR placed. Patient states he will come by in the morning for CXR. Voiced understanding. Nothing further is needed at this time.

## 2018-12-17 NOTE — Telephone Encounter (Signed)
Oncology Nurse Navigator Documentation  Returned morning VMM to pt.  Per his request provided phone #s for [facility] and Dr. Calton Dach RN.  Gayleen Orem, RN, BSN Head & Neck Oncology Nurse [place] [place] [phone]

## 2018-12-17 NOTE — Telephone Encounter (Signed)
Yes CXR is fine, please order under pleuritic pain. Take delsym cough syrup twice a day. Advise ibuprofen 2-3 times a day as long as he has no hx GI bleed and is not on a blood thinner.

## 2018-12-18 ENCOUNTER — Other Ambulatory Visit: Payer: Self-pay

## 2018-12-18 ENCOUNTER — Ambulatory Visit: Payer: Medicaid Other

## 2018-12-19 ENCOUNTER — Other Ambulatory Visit: Payer: Self-pay

## 2018-12-19 ENCOUNTER — Ambulatory Visit (INDEPENDENT_AMBULATORY_CARE_PROVIDER_SITE_OTHER): Payer: Medicaid Other

## 2018-12-19 DIAGNOSIS — R0781 Pleurodynia: Secondary | ICD-10-CM

## 2019-02-10 ENCOUNTER — Encounter: Payer: Self-pay | Admitting: Nurse Practitioner

## 2019-02-10 ENCOUNTER — Ambulatory Visit (INDEPENDENT_AMBULATORY_CARE_PROVIDER_SITE_OTHER): Payer: Medicaid Other | Admitting: Nurse Practitioner

## 2019-02-10 ENCOUNTER — Other Ambulatory Visit: Payer: Self-pay

## 2019-02-10 ENCOUNTER — Telehealth: Payer: Self-pay | Admitting: Emergency Medicine

## 2019-02-10 DIAGNOSIS — J441 Chronic obstructive pulmonary disease with (acute) exacerbation: Secondary | ICD-10-CM | POA: Diagnosis not present

## 2019-02-10 MED ORDER — PREDNISONE 10 MG PO TABS: 20.0000 mg | ORAL_TABLET | Freq: Every day | ORAL | 0 refills | Status: AC

## 2019-02-10 MED ORDER — ALBUTEROL SULFATE HFA 108 (90 BASE) MCG/ACT IN AERS: 2.0000 | INHALATION_SPRAY | RESPIRATORY_TRACT | 1 refills | Status: DC | PRN

## 2019-02-10 NOTE — Assessment & Plan Note (Signed)
Patient states feeling the past couple weeks he has been having shortness of breath with exertion times.  Is any significant cough or fever.  Patient continues to smoke but is trying to cut back.  He states that he only occasionally smokes a cigarette. patient states that he has been out of his albuterol inhaler.  He needs a refill today.  Patient does have a history of pulmonary nodules and needs a follow-up CT scan this month.  He denies any recent sick contacts.  Patient Instructions  Will order prednisone taper Your repeat CT scan (follow up lung nodules) was ordered for June - will need to be scheduled Please continue your albuterol 2 puffs if needed for shortness of breath, wheezing, chest tightness. Will order prednisone  Follow up: Follow up with Dr. Lamonte Sakai in 1 month after the CT scan to discuss results

## 2019-02-10 NOTE — Patient Instructions (Addendum)
Will order prednisone taper Your repeat CT scan (follow up lung nodules) was ordered for June - will need to be scheduled Please continue your albuterol 2 puffs if needed for shortness of breath, wheezing, chest tightness. Will order prednisone  Follow up: Follow up with Dr. Lamonte [name] in 1 month after the CT scan to discuss results

## 2019-02-10 NOTE — Telephone Encounter (Signed)
Primary Pulmonologist: Dr.[name] Last office visit and with whom:09/12/18 RB What do we see them for (pulmonary problems): COPD  Reason for call: pt c/o sob that comes and goes. Pt is using ProAir 2-3 times everyday. Pt denies any other sx  In the last month, have you been in contact with someone who was confirmed or suspected to have Conoravirus / COVID-19?  No  Do you have any of the following symptoms developed in the last 30 days? Fever:NO Cough: No Shortness of breath: comes and goes When did your symptoms start?  Last week  Pharm: [facility] [address] Pt also needs refill of ProAir.     Assessment & Plan:  COPD GOLD II Currently on albuterol as needed.  I think he has an acute bronchitis following a recent upper respiratory infection.  He is not wheezing on exam.  I like to treat him with azithromycin, defer steroids at this time.  Acute bronchitis In the setting of a viral upper respiratory infection, positive sick contact.  Treated with azithromycin.  As above no indication currently for steroids.  Cigarette smoker Discussed cessation with him today.  He actually has not had a cigarette since the beginning of the year.  I congratulated him on this.  Multiple lung nodules on CT His pulmonary nodules are unchanged compared with May 2019.  We will plan to repeat in June 2020  Your CT scan of the chest shows that your right upper lobe pulmonary nodule is stable in appearance and size.  This is good news.  We will repeat your CT without contrast in June 2020 to confirm that it is not changing. Please continue your albuterol 2 puffs if needed for shortness of breath, wheezing, chest tightness. Please take azithromycin until completely gone.  Follow with Dr Lamonte Sakai in June 2020 after your CT scan, or sooner if you have any other problems.     Baltazar Apo, MD, PhD 09/12/2018, 12:56 PM [facility]

## 2019-02-10 NOTE — Telephone Encounter (Signed)
Please set up for video visit is able if not televisit with  Waldon Reining NP   Please contact office for sooner follow up if symptoms do not improve or worsen or seek emergency care

## 2019-02-10 NOTE — Telephone Encounter (Signed)
Called and talked to patient. Patient agreed to Televisit today with TN at 2pm.  Patient reported increased shortness of breath and using his Mother's pro-air inhaler to help with his symptoms. Patient sees Dr. Lamonte Sakai for COPD.    Nothing further needed at this time.

## 2019-02-10 NOTE — Progress Notes (Signed)
Virtual Visit via Telephone Note  I connected with [name] [name] on 02/10/19 at  2:00 PM EDT by telephone and verified that I am speaking with the correct person using two identifiers.  Location: Patient: home Provider: office   I discussed the limitations, risks, security and privacy concerns of performing an evaluation and management service by telephone and the availability of in person appointments. I also discussed with the patient that there may be a patient responsible charge related to this service. The patient expressed understanding and agreed to proceed.   History of Present Illness: 63 year old male active smoker with COPD and lung nodules who is followed by Dr. Lamonte Sakai  Patient has a tele-visit today for an acute visit.  Patient states feeling the past couple weeks he has been having shortness of breath with exertion times.  Is any significant cough or fever.  Patient continues to smoke but is trying to cut back.  He states that he only occasionally smokes a cigarette. patient states that he has been out of his albuterol inhaler.  He needs a refill today.  Patient does have a history of pulmonary nodules and needs a follow-up CT scan this month.  He denies any recent sick contacts. Denies f/c/s, n/v/d, hemoptysis, PND, leg swelling.    Observations/Objective: CXR 12/19/18 - Atypical scarring and fibrosis bilaterally. The opacity in the right apex appears rather nodular, similar to appearance on most recent CT. No new opacity evident. No pneumothorax. Heart size within normal limits. Ct Chest 08/31/19 - Stable biapical pleural and parenchymal scarring changes with stable nodular density at the right lung apex. No new or progressive or worrisome findings. Recommend follow-up noncontrast chest CT in 12 months. Stable emphysematous changes. No acute pulmonary findings or new pulmonary nodules. No mediastinal or hilar mass or adenopathy. Emphysema   Assessment and Plan: Patient states  feeling the past couple weeks he has been having shortness of breath with exertion times.  Is any significant cough or fever.  Patient continues to smoke but is trying to cut back.  He states that he only occasionally smokes a cigarette. patient states that he has been out of his albuterol inhaler.  He needs a refill today.  Patient does have a history of pulmonary nodules and needs a follow-up CT scan this month.  He denies any recent sick contacts.  Patient Instructions  Will order prednisone taper Your repeat CT scan (follow up lung nodules) was ordered for June - will need to be scheduled Please continue your albuterol 2 puffs if needed for shortness of breath, wheezing, chest tightness. Will order prednisone    Follow Up Instructions: Follow up with Dr. Lamonte Sakai in 1 month after the CT scan to discuss results    I discussed the assessment and treatment plan with the patient. The patient was provided an opportunity to ask questions and all were answered. The patient agreed with the plan and demonstrated an understanding of the instructions.   The patient was advised to call back or seek an in-person evaluation if the symptoms worsen or if the condition fails to improve as anticipated.  I provided 22 minutes of non-face-to-face time during this encounter.   Fenton Foy, NP

## 2019-02-28 ENCOUNTER — Telehealth: Payer: Self-pay | Admitting: *Deleted

## 2019-02-28 NOTE — Telephone Encounter (Signed)

## 2019-03-03 ENCOUNTER — Other Ambulatory Visit: Payer: Self-pay

## 2019-03-03 ENCOUNTER — Encounter (HOSPITAL_COMMUNITY): Payer: Self-pay

## 2019-03-03 ENCOUNTER — Ambulatory Visit (INDEPENDENT_AMBULATORY_CARE_PROVIDER_SITE_OTHER)
Admission: RE | Admit: 2019-03-03 | Discharge: 2019-03-03 | Disposition: A | Discharge status: Home / Self Care | Payer: Medicaid Other | Source: Ambulatory Visit | Attending: Emergency Medicine | Admitting: Emergency Medicine

## 2019-03-03 ENCOUNTER — Ambulatory Visit (HOSPITAL_COMMUNITY)
Admission: EM | Admit: 2019-03-03 | Discharge: 2019-03-03 | Disposition: A | Discharge status: Home / Self Care | Payer: Medicaid Other | Attending: Family Medicine | Admitting: Family Medicine

## 2019-03-03 DIAGNOSIS — R918 Other nonspecific abnormal finding of lung field: Secondary | ICD-10-CM | POA: Diagnosis not present

## 2019-03-03 DIAGNOSIS — R030 Elevated blood-pressure reading, without diagnosis of hypertension: Secondary | ICD-10-CM

## 2019-03-03 NOTE — Discharge Instructions (Addendum)
Your blood pressure was 135/91 It needs to stay under 140/90 It is very minimally high I am attaching a diet plan No smoking or drinking alcohol Take a walk every day Follow up with your doctor

## 2019-03-03 NOTE — ED Triage Notes (Signed)
Pt presents with elevated blood pressure X 7 days with dizziness.

## 2019-03-03 NOTE — ED Provider Notes (Signed)
[facility]    CSN: [id number] Arrival date & time: 03/03/19  1604      History   Chief Complaint Chief Complaint  Patient presents with  . Hypertension    HPI [name] [name] is a 63 y.o. male.   HPI  Patient lives with his mother and takes her BP regularly.  He felt a little "dizzy", lightheaded when standing up, a few days ago so took his BP.  It was in the 140/90 range.  He always has run in the 110-120 / 80 range.  He is upset and is worried he will "go to sleep and not wake up".  Does not have a good understanding of the disease.  NO syncope pr presyncope.  No chest pain or SOB.  No new foods supplements or meds.  Never sleeps well.  Drinks beer - not every day.  Still smokes occasionally.  Knows he should not.  BP runs in the family.  Called his doc today but they have not answered.  Past Medical History:  Diagnosis Date  . Anxiety   . Cancer ([place]) 04/04/2016   cancer of left tonsil  . CKD (chronic kidney disease), stage III ([place])   . COPD (chronic obstructive pulmonary disease) ([place])   . Hepatitis   . Hepatitis C    Treatment x 8 weeks 6-7 months ago [facility]  . History of radiation therapy 05/23/16- 07/10/16   Left Tonsil and bilateral neck  . Sinus congestion   . Stomach ulcer     Patient Active Problem List   Diagnosis Date Noted  . COPD with acute exacerbation ([place]) 02/10/2019  . Other fatigue 09/17/2017  . Neck stiffness 08/03/2017  . Multiple lung nodules on CT 12/05/2016  . Multifocal pneumonia 10/04/2016  . Chronic kidney disease, stage III (moderate) ([place]) 09/14/2016  . Dry mouth 09/14/2016  . Dysgeusia 09/05/2016  . Bronchitis 09/01/2016  . Anemia due to chronic illness 08/24/2016  . Acute prerenal failure ([place]) 07/24/2016  . Hemoptysis 07/24/2016  . Pancytopenia, acquired ([place]) 07/04/2016  . Atypical chest pain 06/27/2016  . Bilateral tinnitus 05/30/2016  . Hypersensitivity reaction 05/23/2016  . Lesion of right lung 04/26/2016  .  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 History of hepatitis C 04/24/2016  . Anxiety   . Dyspnea 03/25/2014  . COPD GOLD II 07/25/2013  . Sinusitis, chronic 07/25/2013  . Tobacco abuse 07/25/2013    Past Surgical History:  Procedure Laterality Date  . COLONOSCOPY WITH PROPOFOL N/A 04/14/2015   Procedure: COLONOSCOPY WITH PROPOFOL;  Surgeon: Arta Silence, MD;  Location: [facility];  Service: Endoscopy;  Laterality: N/A;  . HERNIA REPAIR    . IR GENERIC HISTORICAL  05/17/2016   IR FLUORO GUIDE PORT INSERTION RIGHT 05/17/2016 Jacqulynn Cadet, MD [facility]  . IR GENERIC HISTORICAL  05/17/2016   IR US GUIDE VASC ACCESS RIGHT 05/17/2016 Jacqulynn Cadet, MD [facility]  . IR GENERIC HISTORICAL  05/17/2016   IR GASTROSTOMY TUBE MOD SED 05/17/2016 Jacqulynn Cadet, MD [facility]  . IR GENERIC HISTORICAL  11/13/2016   IR GASTROSTOMY TUBE REMOVAL 11/13/2016 Greggory Keen, MD [facility]  . IR GENERIC HISTORICAL  11/13/2016   IR REMOVAL TUN ACCESS W/ PORT W/O FL MOD SED 11/13/2016 Greggory Keen, MD [facility]  . MULTIPLE EXTRACTIONS WITH ALVEOLOPLASTY N/A 05/03/2016   Procedure: Multiple extractions with alveoloplasty and gross debridement of remaining teeth.;  Surgeon: Lenn Cal, DDS;  Location: [facility];  Service: Oral  Surgery;  Laterality: N/A;  . STOMACH SURGERY     20 years       Home Medications    Prior to Admission medications   Medication Sig Start Date End Date Taking? Authorizing Provider  albuterol (VENTOLIN HFA) 108 (90 Base) MCG/ACT inhaler Inhale 2 puffs into the lungs every 4 (four) hours as needed for wheezing or shortness of breath. 02/10/19   Collene Gobble, MD  aspirin EC 81 MG tablet Take 81 mg by mouth daily.    [provider]    Family History Family History  Problem Relation Age of Onset  . Hypertension Mother   . Anxiety disorder Mother   . Diabetes Mother   . Cancer Father     Social History Social History    Tobacco Use  . Smoking status: Current Some Day Smoker    Packs/day: 0.50    Years: 30.00    Pack years: 15.00    Types: Cigarettes  . Smokeless tobacco: Never Used  . Tobacco comment: says he hasn't smoked in 1 week. 09/12/2018  Substance Use Topics  . Alcohol use: Yes    Comment: QOD  . Drug use: No    Types: Cocaine     Allergies   Patient has no known allergies.   Review of Systems Review of Systems  Constitutional: Negative for chills and fever.  HENT: Negative for dental problem, ear pain and sore throat.   Eyes: Negative for pain and visual disturbance.  Respiratory: Negative for cough and shortness of breath.   Cardiovascular: Negative for chest pain and palpitations.  Gastrointestinal: Negative for abdominal pain, nausea and vomiting.  Genitourinary: Negative for dysuria and hematuria.  Musculoskeletal: Negative for arthralgias and back pain.  Skin: Negative for color change and rash.  Neurological: Positive for dizziness. Negative for seizures, syncope and headaches.  Psychiatric/Behavioral: Positive for sleep disturbance. The patient is nervous/anxious.        Somewhat stressed with COVID  All other systems reviewed and are negative.    Physical Exam Triage Vital Signs ED Triage Vitals  Enc Vitals Group     BP 03/03/19 1637 (!) 146/96     Pulse Rate 03/03/19 1637 71     Resp 03/03/19 1637 18     Temp 03/03/19 1637 98.2 F (36.8 C)     Temp Source 03/03/19 1637 Oral     SpO2 03/03/19 1637 99 %     Weight --      Height --      Head Circumference --      Peak Flow --      Pain Score 03/03/19 1638 0     Pain Loc --      Pain Edu? --      Excl. in [place]? --    No data found.  Updated Vital Signs BP (!) 146/96 (BP Location: Left Arm)   Pulse 71   Temp 98.2 F (36.8 C) (Oral)   Resp 18   SpO2 99%    Physical Exam Constitutional:      General: He is not in acute distress.    Appearance: He is well-developed and normal weight.     Comments:  Lean.  talkative  HENT:     Head: Normocephalic and atraumatic.     Right Ear: Tympanic membrane and ear canal normal.     Left Ear: Tympanic membrane and ear canal normal.     Nose: Nose normal.     Mouth/Throat:  Mouth: Mucous membranes are moist.     Comments: Many absent teeth.  Scarring from radiation Tx Eyes:     Conjunctiva/sclera: Conjunctivae normal.     Pupils: Pupils are equal, round, and reactive to light.  Neck:     Musculoskeletal: Normal range of motion.     Vascular: No carotid bruit.  Cardiovascular:     Rate and Rhythm: Normal rate and regular rhythm.     Heart sounds: Normal heart sounds.  Pulmonary:     Effort: Pulmonary effort is normal. No respiratory distress.     Breath sounds: Normal breath sounds. No wheezing, rhonchi or rales.  Abdominal:     General: Abdomen is flat. There is no distension.     Palpations: Abdomen is soft.  Musculoskeletal: Normal range of motion.  Lymphadenopathy:     Cervical: No cervical adenopathy.  Skin:    General: Skin is warm and dry.  Neurological:     General: No focal deficit present.     Mental Status: He is alert.     Coordination: Coordination normal.     Gait: Gait normal.     Deep Tendon Reflexes: Reflexes normal.  Psychiatric:        Mood and Affect: Mood normal.        Behavior: Behavior normal.     REPEAT BP 135/91   UC Treatments / Results  Labs (all labs ordered are listed, but only abnormal results are displayed) Labs Reviewed - No data to display  EKG None  Radiology   Procedures Procedures (including critical care time)  Medications Ordered in UC Medications - No data to display  Initial Impression / Assessment and Plan / UC Course  I have reviewed the triage vital signs and the nursing notes.  Pertinent labs & imaging results that were available during my care of the patient were reviewed by me and considered in my medical decision making (see chart for details).     Discussed  hypertension. Recommend BP log to take to PCP. Final Clinical Impressions(s) / UC Diagnoses   Final diagnoses:  Elevated blood-pressure reading without diagnosis of hypertension     Discharge Instructions     Your blood pressure was 135/91 It needs to stay under 140/90 It is very minimally high I am attaching a diet plan No smoking or drinking alcohol Take a walk every day Follow up with your doctor    ED Prescriptions    None     Controlled Substance Prescriptions [place] Controlled Substance Registry consulted? Not Applicable   Raylene Everts, MD 03/03/19 [phone]

## 2019-04-07 ENCOUNTER — Ambulatory Visit: Payer: Medicaid Other | Admitting: Nurse Practitioner

## 2019-09-29 ENCOUNTER — Encounter: Payer: Self-pay | Admitting: Hematology and Oncology

## 2019-09-29 ENCOUNTER — Inpatient Hospital Stay: Payer: Medicaid Other | Admitting: Hematology and Oncology

## 2019-09-29 ENCOUNTER — Inpatient Hospital Stay: Payer: Medicaid Other | Attending: Hematology and Oncology

## 2019-10-07 ENCOUNTER — Telehealth: Payer: Self-pay | Admitting: Hematology and Oncology

## 2019-10-07 NOTE — Telephone Encounter (Signed)
Rescheduled per 2/1 sch msg, pt req. Called and spoke with pt, confirmed 2/4 appt

## 2019-10-09 ENCOUNTER — Inpatient Hospital Stay (HOSPITAL_BASED_OUTPATIENT_CLINIC_OR_DEPARTMENT_OTHER): Payer: Medicaid Other | Admitting: Hematology and Oncology

## 2019-10-09 ENCOUNTER — Other Ambulatory Visit: Payer: Self-pay

## 2019-10-09 ENCOUNTER — Inpatient Hospital Stay: Payer: Medicaid Other | Attending: Hematology and Oncology

## 2019-10-09 ENCOUNTER — Encounter: Payer: Self-pay | Admitting: Hematology and Oncology

## 2019-10-09 DIAGNOSIS — Z7982 Long term (current) use of aspirin: Secondary | ICD-10-CM | POA: Diagnosis not present

## 2019-10-09 DIAGNOSIS — Z79899 Other long term (current) drug therapy: Secondary | ICD-10-CM | POA: Diagnosis not present

## 2019-10-09 DIAGNOSIS — C090 Malignant neoplasm of tonsillar fossa: Secondary | ICD-10-CM

## 2019-10-09 DIAGNOSIS — Z85818 Personal history of malignant neoplasm of other sites of lip, oral cavity, and pharynx: Secondary | ICD-10-CM | POA: Insufficient documentation

## 2019-10-09 DIAGNOSIS — Z720 Tobacco use: Secondary | ICD-10-CM

## 2019-10-09 DIAGNOSIS — N183 Chronic kidney disease, stage 3 unspecified: Secondary | ICD-10-CM | POA: Insufficient documentation

## 2019-10-09 DIAGNOSIS — R918 Other nonspecific abnormal finding of lung field: Secondary | ICD-10-CM | POA: Insufficient documentation

## 2019-10-09 DIAGNOSIS — D638 Anemia in other chronic diseases classified elsewhere: Secondary | ICD-10-CM

## 2019-10-09 DIAGNOSIS — Z9221 Personal history of antineoplastic chemotherapy: Secondary | ICD-10-CM | POA: Diagnosis not present

## 2019-10-09 DIAGNOSIS — R5383 Other fatigue: Secondary | ICD-10-CM

## 2019-10-09 DIAGNOSIS — F1010 Alcohol abuse, uncomplicated: Secondary | ICD-10-CM | POA: Insufficient documentation

## 2019-10-09 DIAGNOSIS — J439 Emphysema, unspecified: Secondary | ICD-10-CM | POA: Diagnosis not present

## 2019-10-09 DIAGNOSIS — Z923 Personal history of irradiation: Secondary | ICD-10-CM | POA: Diagnosis not present

## 2019-10-09 DIAGNOSIS — F17210 Nicotine dependence, cigarettes, uncomplicated: Secondary | ICD-10-CM | POA: Diagnosis not present

## 2019-10-09 LAB — CBC WITH DIFFERENTIAL/PLATELET
Abs Immature Granulocytes: 0.01 10*3/uL (ref 0.00–0.07)
Basophils Absolute: 0 10*3/uL (ref 0.0–0.1)
Basophils Relative: 1 %
Eosinophils Absolute: 0.2 10*3/uL (ref 0.0–0.5)
Eosinophils Relative: 3 %
HCT: 42.6 % (ref 39.0–52.0)
Hemoglobin: 14.7 g/dL (ref 13.0–17.0)
Immature Granulocytes: 0 %
Lymphocytes Relative: 35 %
Lymphs Abs: 1.8 10*3/uL (ref 0.7–4.0)
MCH: 33.3 pg (ref 26.0–34.0)
MCHC: 34.5 g/dL (ref 30.0–36.0)
MCV: 96.6 fL (ref 80.0–100.0)
Monocytes Absolute: 0.7 10*3/uL (ref 0.1–1.0)
Monocytes Relative: 14 %
Neutro Abs: 2.5 10*3/uL (ref 1.7–7.7)
Neutrophils Relative %: 47 %
Platelets: 272 10*3/uL (ref 150–400)
RBC: 4.41 MIL/uL (ref 4.22–5.81)
RDW: 13.6 % (ref 11.5–15.5)
WBC: 5.3 10*3/uL (ref 4.0–10.5)
nRBC: 0 % (ref 0.0–0.2)

## 2019-10-09 LAB — TSH: TSH: 1.524 u[IU]/mL (ref 0.320–4.118)

## 2019-10-09 LAB — CMP (CANCER CENTER ONLY)
ALT: 29 U/L (ref 0–44)
AST: 59 U/L — ABNORMAL HIGH (ref 15–41)
Albumin: 4.6 g/dL (ref 3.5–5.0)
Alkaline Phosphatase: 72 U/L (ref 38–126)
Anion gap: 9 (ref 5–15)
BUN: 23 mg/dL (ref 8–23)
CO2: 26 mmol/L (ref 22–32)
Calcium: 9.4 mg/dL (ref 8.9–10.3)
Chloride: 106 mmol/L (ref 98–111)
Creatinine: 1.86 mg/dL — ABNORMAL HIGH (ref 0.61–1.24)
GFR, Est AFR Am: 44 mL/min — ABNORMAL LOW (ref >60)
GFR, Estimated: 38 mL/min — ABNORMAL LOW (ref >60)
Glucose, Bld: 102 mg/dL — ABNORMAL HIGH (ref 70–99)
Potassium: 5.1 mmol/L (ref 3.5–5.1)
Sodium: 141 mmol/L (ref 135–145)
Total Bilirubin: 0.6 mg/dL (ref 0.3–1.2)
Total Protein: 8.6 g/dL — ABNORMAL HIGH (ref 6.5–8.1)

## 2019-10-09 NOTE — Assessment & Plan Note (Signed)
He has no signs or symptoms to suggest cancer recurrence However, I am concerned about his smoking and drinking habit that could predispose him to recurrent cancer or different malignancies We discussed the importance of nicotine cessation and staying abstinent from alcohol I will see him back once a year for further follow-up

## 2019-10-09 NOTE — Progress Notes (Signed)
[facility] OFFICE PROGRESS NOTE  Patient Care Team: Vassie Moment, MD as PCP - General (Family Medicine) Heath Lark, MD as Consulting Physician (Hematology and Oncology) Jodi Marble, MD as Consulting Physician (Otolaryngology) Leota Sauers, RN as Oncology Nurse Navigator (Oncology) Eppie Gibson, MD as Attending Physician (Radiation Oncology) Lenn Cal, DDS as Consulting Physician (Dentistry) Karie Mainland, RD as Dietitian (Nutrition)  ASSESSMENT & PLAN:  Malignant neoplasm of tonsillar fossa [facility]) He has no signs or symptoms to suggest cancer recurrence However, I am concerned about his smoking and drinking habit that could predispose him to recurrent cancer or different malignancies We discussed the importance of nicotine cessation and staying abstinent from alcohol I will see him back once a year for further follow-up  Chronic kidney disease, stage III (moderate) Likely due to prior treatment His serum creatinine is stable  Alcohol abuse The patient stated he is drinking less but nevertheless he is still drinking We discussed the importance of staying abstinence due to high risk of cancer recurrence  Tobacco abuse The patient continues to smoke on a regular basis He has signs of emphysema on CT imaging We discussed the importance of nicotine cessation  Multiple lung nodules on CT He has close follow-up with pulmonary clinic His last CT imaging from June showed persistent emphysematous changes, upper lobe scarring as well as stable lung nodules I recommend he continues close follow-up in pulmonology   Orders Placed This Encounter  Procedures  . CMP ([place] only)    All questions were answered. The patient knows to call the clinic with any problems, questions or concerns. The total time spent in the appointment was 25 minutes encounter with patients including review of chart and various tests results, discussions about plan of care  and coordination of care plan   Heath Lark, MD 10/09/2019 9:07 AM  INTERVAL HISTORY: Please see below for problem oriented charting. He returns for further follow-up He is noted to be somewhat noncompliant, missing his recent appointment He stated he continues to smoke and drink He drink approximately 3 cans of beer per week and smoked approximately half a pack of cigarettes per week He is attempting to quit on his own He denies recent cough, chest pain or shortness of breath No recent swallowing difficulties  SUMMARY OF ONCOLOGIC HISTORY: Oncology History  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[place])  04/04/2016 Initial Diagnosis   He saw ENT and had laryngoscopy and biopsy of left tonsil   04/04/2016 Pathology Results   [id number] 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 Chronic small-vessel ischemic changes of the cerebral hemispheric white matter.    04/04/2016 Imaging   CT neck showed left tonsillar mass with diameter of 19 x 24 mm consistent with 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 necrosis. Suspicious level 5/supraclavicular nodes on the left. Scar type density in the right upper lobe is more prominent than was seen on a CT scan of the chest 10/06/2014   04/21/2016 PET scan   Asymmetric hypermetabolic soft tissue prominence in left palatine tonsil, consistent with known primary left tonsillar squamous cell Carcinoma. Left level 2 cervical lymphadenopathy, consistent with metastatic disease. No evidence of metastatic disease within the chest, abdomen, or pelvis. Stable 14 mm ground-glass and part solid nodular opacity in right lung apex compared to previous CT in 2016. This shows low-grade metabolic activity. Low-grade bronchogenic adenocarcinoma cannot  be excluded. Consider surgical resection versus continued followup by CT in 12 months   05/17/2016  Procedure   Successful placement of a 20 French pull through gastrostomy tube & right IJ approach [facility] [place].   05/22/2016 - 07/03/2016 Chemotherapy   He received 3 doses of high dose cisplatin with radiation    05/23/2016 - 07/10/2016 Radiation Therapy   Rec'd Helical IMRT:  Left tonsil and bilateral neck / 70 Gy in 35 fractions to gross disease, 63 Gy in 35 fractions to high risk nodal echelons, and 56 Gy in 35 fractions to intermediate risk nodal echelons.       10/04/2016 - 10/07/2016 Hospital Admission   He was admitted due to atypical chest pain from illicit drug use. CT scan showed possible atypical pneumonia versus radiation pneumonitis   10/04/2016 Imaging   Ct scan showed no evidence of pulmonary embolus. 2. Patchy bilateral airspace opacities, at the upper lobes, compatible with multifocal pneumonia. This corresponds to the finding on recent chest radiograph. 3. Previously noted ground-glass and solid nodule at the right lung apex demonstrates increased solid components. The solid component now measures 1.2 cm. 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 Tissue diagnosis would be helpful, as deemed clinically appropriate.   10/26/2016 PET scan   Slight increasing solid component of a right apical lung lesion but no concerning hypermetabolism. Recommend continued surveillance. 2. Bilateral patchy apical airspace opacities are mild to moderately hypermetabolic and likely reflect infection. Recommend short-term followup noncontrast chest CT in 3 months to reassess. 3. No mediastinal or hilar mass or adenopathy. 4. No significant findings in the abdomen/pelvis. 5. Stable lucencies throughout the bony pelvis but no hypermetabolism to suggest metastasis.   11/13/2016 Procedure   Successful right IJ vein Port-A-Cath explant.   11/13/2016 Procedure   Uncomplicated gastrostomy removal   01/23/2017 Imaging   Ct neck: 1. Sequelae of radiation  therapy in the neck. The oropharynx level of this neck CT is degraded by motion artifact. Still, soft tissue volume has definitely decreased at the level of the 2017 left tonsillar mass, suggesting positive response to treatment and concordant with the negative findings on the February PET-CT. 2. Normalized left level 2 metastatic lymph node. No abnormal cervical lymph nodes today. 3. Chest CT findings today are reported separately.   01/23/2017 Imaging   Ct chest: 1. Stable biapical pleural and parenchymal scarring changes with patchy airspace opacities and interstitial thickening most likely representing postinflammatory/postinfectious scarring changes. No new pulmonary lesions to suggest metastatic disease. Recommend six-month follow-up noncontrast chest CT to reassess. 2. Stable emphysematous changes. 3. No mediastinal or hilar mass or adenopathy.   08/02/2017 Imaging   1. Irregular subsolid apical right upper lobe 1.8 cm pulmonary nodule is stable in size with slightly increased solid component in the interval. This nodule has increased in density and mildly increased in size since 10/06/2014 chest CT, which pre-dates the history of radiation therapy for the left tonsillar malignancy, which was diagnosed in August 2017. As such, an indolent/low grade primary bronchogenic adenocarcinoma remains on the differential. Continued surveillance is advised if surgical management or biopsy is not elected at this time. 2. Separate biapical lung opacities demonstrate changes suggestive of evolving postinflammatory change such as due to prior radiation therapy. 3. No thoracic adenopathy or other findings suggestive of metastatic disease in the chest.   09/17/2017 PET scan   1. No findings to suggest recurrent or metastatic head/neck primary. 2. Right apical mixed attenuation nodule  is similar in size to most recent CT and similar in hypermetabolism compared to 10/26/2016. As on the recent CT, slow interval growth  suggests indolent neoplasm such as low-grade adenocarcinoma. 3. Aortic Atherosclerosis (ICD10-I70.0). 4. Stable appearance of non FDG avid lucencies throughout the bony pelvis. These warrant ongoing follow-up attention. 5. Trace cul-de-sac fluid, new or increased since the prior PET.   08/30/2018 Imaging   1. Stable biapical pleural and parenchymal scarring changes with stable nodular density at the right lung apex. No new or progressive or worrisome findings. Recommend follow-up noncontrast chest CT in 12 months. 2. Stable emphysematous changes. No acute pulmonary findings or new pulmonary nodules. 3. No mediastinal or hilar mass or adenopathy.    03/03/2019 Imaging   1. Asymmetric nodular lesion in the apical segment of the right upper lobe is stable from 01/23/2018 but appears slightly more prominent than on 09/17/2017. Additional follow-up in 1 year is recommended, as clinically indicated. 2.  Aortic atherosclerosis (ICD10-170.0). 3.  Emphysema (ICD10-J43.9).       REVIEW OF SYSTEMS:   Constitutional: Denies fevers, chills or abnormal weight loss Eyes: Denies blurriness of vision Ears, nose, mouth, throat, and face: Denies mucositis or sore throat Respiratory: Denies cough, dyspnea or wheezes Cardiovascular: Denies palpitation, chest discomfort or lower extremity swelling Gastrointestinal:  Denies nausea, heartburn or change in bowel habits Skin: Denies abnormal skin rashes Lymphatics: Denies new lymphadenopathy or easy bruising Neurological:Denies numbness, tingling or new weaknesses Behavioral/Psych: Mood is stable, no new changes  All other systems were reviewed with the patient and are negative.  I have reviewed the past medical history, past surgical history, social history and family history with the patient and they are unchanged from previous note.  ALLERGIES:  has No Known Allergies.  MEDICATIONS:  Current Outpatient Medications  Medication Sig Dispense Refill  .  albuterol (VENTOLIN HFA) 108 (90 Base) MCG/ACT inhaler Inhale 2 puffs into the lungs every 4 (four) hours as needed for wheezing or shortness of breath. 1 Inhaler 1  . aspirin EC 81 MG tablet Take 81 mg by mouth daily.     No current facility-administered medications for this visit.    PHYSICAL EXAMINATION: ECOG PERFORMANCE STATUS: 0 - Asymptomatic  Vitals:   10/09/19 0813  BP: (!) 124/99  Pulse: 93  Resp: 18  Temp: (!) 97.1 F (36.2 C)  SpO2: 98%   Filed Weights   10/09/19 0813  Weight: 139 lb 8 oz (63.3 kg)    GENERAL:alert, no distress and comfortable SKIN: skin color, texture, turgor are normal, no rashes or significant lesions EYES: normal, Conjunctiva are pink and non-injected, sclera clear OROPHARYNX:no exudate, no erythema and lips, buccal mucosa, and tongue normal  NECK: supple, thyroid normal size, non-tender, without nodularity LYMPH:  no palpable lymphadenopathy in the cervical, axillary or inguinal LUNGS: clear to auscultation and percussion with normal breathing effort HEART: regular rate & rhythm and no murmurs and no lower extremity edema ABDOMEN:abdomen soft, non-tender and normal bowel sounds Musculoskeletal:no cyanosis of digits and no clubbing  NEURO: alert & oriented x 3 with fluent speech, no focal motor/sensory deficits  LABORATORY DATA:  I have reviewed the data as listed    Component Value Date/Time   NA 141 10/09/2019 0801   NA 139 07/30/2017 0802   K 5.1 10/09/2019 0801   K 4.9 07/30/2017 0802   CL 106 10/09/2019 0801   CO2 26 10/09/2019 0801   CO2 27 07/30/2017 0802   GLUCOSE 102 (H) 10/09/2019 0801  GLUCOSE 97 07/30/2017 0802   BUN 23 10/09/2019 0801   BUN 21.0 07/30/2017 0802   CREATININE 1.86 (H) 10/09/2019 0801   CREATININE 1.6 (H) 07/30/2017 0802   CALCIUM 9.4 10/09/2019 0801   CALCIUM 10.1 07/30/2017 0802   PROT 8.6 (H) 10/09/2019 0801   PROT 8.0 07/30/2017 0802   ALBUMIN 4.6 10/09/2019 0801   ALBUMIN 4.3 07/30/2017 0802    AST 59 (H) 10/09/2019 0801   AST 27 07/30/2017 0802   ALT 29 10/09/2019 0801   ALT 16 07/30/2017 0802   ALKPHOS 72 10/09/2019 0801   ALKPHOS 64 07/30/2017 0802   BILITOT 0.6 10/09/2019 0801   BILITOT 0.52 07/30/2017 0802   GFRNONAA 38 (L) 10/09/2019 0801   GFRAA 44 (L) 10/09/2019 0801    No results found for: SPEP, UPEP  Lab Results  Component Value Date   WBC 5.3 10/09/2019   NEUTROABS 2.5 10/09/2019   HGB 14.7 10/09/2019   HCT 42.6 10/09/2019   MCV 96.6 10/09/2019   PLT 272 10/09/2019      Chemistry      Component Value Date/Time   NA 141 10/09/2019 0801   NA 139 07/30/2017 0802   K 5.1 10/09/2019 0801   K 4.9 07/30/2017 0802   CL 106 10/09/2019 0801   CO2 26 10/09/2019 0801   CO2 27 07/30/2017 0802   BUN 23 10/09/2019 0801   BUN 21.0 07/30/2017 0802   CREATININE 1.86 (H) 10/09/2019 0801   CREATININE 1.6 (H) 07/30/2017 0802      Component Value Date/Time   CALCIUM 9.4 10/09/2019 0801   CALCIUM 10.1 07/30/2017 0802   ALKPHOS 72 10/09/2019 0801   ALKPHOS 64 07/30/2017 0802   AST 59 (H) 10/09/2019 0801   AST 27 07/30/2017 0802   ALT 29 10/09/2019 0801   ALT 16 07/30/2017 0802   BILITOT 0.6 10/09/2019 0801   BILITOT 0.52 07/30/2017 0802       RADIOGRAPHIC STUDIES: I have reviewed his prior CT imaging  I have personally reviewed the radiological images as listed and agreed with the findings in the report.

## 2019-10-09 NOTE — Assessment & Plan Note (Signed)
The patient stated he is drinking less but nevertheless he is still drinking We discussed the importance of staying abstinence due to high risk of cancer recurrence

## 2019-10-09 NOTE — Assessment & Plan Note (Signed)
He has close follow-up with pulmonary clinic His last CT imaging from June showed persistent emphysematous changes, upper lobe scarring as well as stable lung nodules I recommend he continues close follow-up in pulmonology

## 2019-10-09 NOTE — Assessment & Plan Note (Signed)
The patient continues to smoke on a regular basis He has signs of emphysema on CT imaging We discussed the importance of nicotine cessation 

## 2019-10-09 NOTE — Assessment & Plan Note (Signed)
Likely due to prior treatment His serum creatinine is stable

## 2019-10-13 ENCOUNTER — Telehealth: Payer: Self-pay

## 2019-10-13 NOTE — Telephone Encounter (Signed)
Called and given below message. He verbalized understanding. 

## 2019-10-13 NOTE — Telephone Encounter (Signed)
-----   Message from Heath Lark, MD sent at 10/13/2019  8:58 AM EST ----- Regarding: pls call and let him know labs are fine. Still waiting to hear back from lung clinic about his follow-up

## 2019-10-15 ENCOUNTER — Telehealth: Payer: Self-pay | Admitting: Hematology and Oncology

## 2019-10-15 ENCOUNTER — Telehealth: Payer: Self-pay | Admitting: Emergency Medicine

## 2019-10-15 NOTE — Telephone Encounter (Signed)
Scheduled per 2/4 sch msg. Called pt no answer and unable to leave a msg. Mailing printout

## 2019-10-15 NOTE — Telephone Encounter (Signed)
I do not see any documentation where anyone from our office tried to call pt.  Called and spoke with pt to see if he knew who had tried to call him and pt stated that his mother answered the phone and the only thing she wrote down was our number for him to call. I stated to pt that looking into this, he is due for a f/u appt with [facility] and asked if I could schedule him an appt and he stated that would be good. Pt has been scheduled a f/u with RB 2/19. Nothing further needed.

## 2019-10-24 ENCOUNTER — Encounter: Payer: Self-pay | Admitting: Emergency Medicine

## 2019-10-24 ENCOUNTER — Ambulatory Visit (INDEPENDENT_AMBULATORY_CARE_PROVIDER_SITE_OTHER): Payer: Medicaid Other | Admitting: Emergency Medicine

## 2019-10-24 DIAGNOSIS — J449 Chronic obstructive pulmonary disease, unspecified: Secondary | ICD-10-CM

## 2019-10-24 DIAGNOSIS — R918 Other nonspecific abnormal finding of lung field: Secondary | ICD-10-CM | POA: Diagnosis not present

## 2019-10-24 NOTE — Progress Notes (Signed)
Virtual Visit via Telephone Note  I connected with [name] [name] on 10/24/19 at  9:15 AM EST by telephone and verified that I am speaking with the correct person using two identifiers.  Location: Patient: Home Provider: Home Office   I discussed the limitations, risks, security and privacy concerns of performing an evaluation and management service by telephone and the availability of in person appointments. I also discussed with the patient that there may be a patient responsible charge related to this service. The patient expressed understanding and agreed to proceed.   History of Present Illness: 64 year old man with a history of tobacco use and mild COPD.  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 He is followed by Dr. Alvy Bimler.    Observations/Objective: Continues to smoke about 1 pack a week Most recent CT scan of the chest was done on 03/03/2019 which I have reviewed, showed stable pulmonary nodules at the apices 1.1 x 1.6 cm on the right. He states that his breathing is doing well - minimal SOB. He has albuterol, has to use it less than once a week.   Assessment and Plan: Abnormal CT chest - repeat CT in June to follow pulmonary nodular disease.  He follows reliably with Dr. Alvy Bimler.  I will see him in June after the CT to review  COPD -Continues to do well on only albuterol as needed.  I don't see an indication to start schedule bronchodilator therapy at this time.  We talked today about the importance of smoking cessation  Tobacco use. -He has cut down his cigarette significantly and I congratulated him on this.  I did talk to him about the need to stop completely, offered to discuss techniques, possible medications to assist with cravings when he is ready to set a quit date.  He will let me know.    Follow Up Instructions:    I discussed the assessment  and treatment plan with the patient. The patient was provided an opportunity to ask questions and all were answered. The patient agreed with the plan and demonstrated an understanding of the instructions.   The patient was advised to call back or seek an in-person evaluation if the symptoms worsen or if the condition fails to improve as anticipated.  I provided 15 minutes of non-face-to-face time during this encounter.   Collene Gobble, MD

## 2019-10-24 NOTE — Addendum Note (Signed)
Addended by: Collene Gobble on: 10/24/2019 09:52 AM   Modules accepted: Orders

## 2020-02-09 ENCOUNTER — Ambulatory Visit (HOSPITAL_COMMUNITY): Payer: Medicaid Other

## 2020-04-19 ENCOUNTER — Emergency Department (HOSPITAL_COMMUNITY)
Admission: EM | Admit: 2020-04-19 | Discharge: 2020-04-20 | Disposition: A | Discharge status: Home / Self Care | Payer: Medicaid Other | Attending: Emergency Medicine | Admitting: Emergency Medicine

## 2020-04-19 ENCOUNTER — Encounter (HOSPITAL_COMMUNITY): Payer: Self-pay

## 2020-04-19 ENCOUNTER — Emergency Department (HOSPITAL_COMMUNITY): Payer: Medicaid Other

## 2020-04-19 DIAGNOSIS — F17210 Nicotine dependence, cigarettes, uncomplicated: Secondary | ICD-10-CM | POA: Diagnosis not present

## 2020-04-19 DIAGNOSIS — R0602 Shortness of breath: Secondary | ICD-10-CM

## 2020-04-19 DIAGNOSIS — R200 Anesthesia of skin: Secondary | ICD-10-CM

## 2020-04-19 DIAGNOSIS — Z85818 Personal history of malignant neoplasm of other sites of lip, oral cavity, and pharynx: Secondary | ICD-10-CM | POA: Diagnosis not present

## 2020-04-19 DIAGNOSIS — R202 Paresthesia of skin: Secondary | ICD-10-CM | POA: Diagnosis not present

## 2020-04-19 DIAGNOSIS — J441 Chronic obstructive pulmonary disease with (acute) exacerbation: Secondary | ICD-10-CM | POA: Insufficient documentation

## 2020-04-19 DIAGNOSIS — Z9221 Personal history of antineoplastic chemotherapy: Secondary | ICD-10-CM | POA: Diagnosis not present

## 2020-04-19 DIAGNOSIS — M79602 Pain in left arm: Secondary | ICD-10-CM

## 2020-04-19 LAB — I-STAT CHEM 8, ED
BUN: 30 mg/dL — ABNORMAL HIGH (ref 8–23)
Calcium, Ion: 1.17 mmol/L (ref 1.15–1.40)
Chloride: 101 mmol/L (ref 98–111)
Creatinine, Ser: 1.6 mg/dL — ABNORMAL HIGH (ref 0.61–1.24)
Glucose, Bld: 90 mg/dL (ref 70–99)
HCT: 42 % (ref 39.0–52.0)
Hemoglobin: 14.3 g/dL (ref 13.0–17.0)
Potassium: 3.9 mmol/L (ref 3.5–5.1)
Sodium: 140 mmol/L (ref 135–145)
TCO2: 30 mmol/L (ref 22–32)

## 2020-04-19 LAB — COMPREHENSIVE METABOLIC PANEL
ALT: 28 U/L (ref 0–44)
AST: 48 U/L — ABNORMAL HIGH (ref 15–41)
Albumin: 4.4 g/dL (ref 3.5–5.0)
Alkaline Phosphatase: 55 U/L (ref 38–126)
Anion gap: 10 (ref 5–15)
BUN: 23 mg/dL (ref 8–23)
CO2: 29 mmol/L (ref 22–32)
Calcium: 9.5 mg/dL (ref 8.9–10.3)
Chloride: 101 mmol/L (ref 98–111)
Creatinine, Ser: 1.67 mg/dL — ABNORMAL HIGH (ref 0.61–1.24)
GFR calc Af Amer: 50 mL/min — ABNORMAL LOW (ref >60)
GFR calc non Af Amer: 44 mL/min — ABNORMAL LOW (ref >60)
Glucose, Bld: 97 mg/dL (ref 70–99)
Potassium: 4 mmol/L (ref 3.5–5.1)
Sodium: 140 mmol/L (ref 135–145)
Total Bilirubin: 0.6 mg/dL (ref 0.3–1.2)
Total Protein: 7.3 g/dL (ref 6.5–8.1)

## 2020-04-19 LAB — DIFFERENTIAL
Abs Immature Granulocytes: 0.02 10*3/uL (ref 0.00–0.07)
Basophils Absolute: 0 10*3/uL (ref 0.0–0.1)
Basophils Relative: 1 %
Eosinophils Absolute: 0.1 10*3/uL (ref 0.0–0.5)
Eosinophils Relative: 1 %
Immature Granulocytes: 0 %
Lymphocytes Relative: 15 %
Lymphs Abs: 0.9 10*3/uL (ref 0.7–4.0)
Monocytes Absolute: 0.8 10*3/uL (ref 0.1–1.0)
Monocytes Relative: 14 %
Neutro Abs: 3.9 10*3/uL (ref 1.7–7.7)
Neutrophils Relative %: 69 %

## 2020-04-19 LAB — CBC
HCT: 39.4 % (ref 39.0–52.0)
Hemoglobin: 13.1 g/dL (ref 13.0–17.0)
MCH: 33.3 pg (ref 26.0–34.0)
MCHC: 33.2 g/dL (ref 30.0–36.0)
MCV: 100.3 fL — ABNORMAL HIGH (ref 80.0–100.0)
Platelets: 182 10*3/uL (ref 150–400)
RBC: 3.93 MIL/uL — ABNORMAL LOW (ref 4.22–5.81)
RDW: 13.7 % (ref 11.5–15.5)
WBC: 5.7 10*3/uL (ref 4.0–10.5)
nRBC: 0 % (ref 0.0–0.2)

## 2020-04-19 LAB — APTT: aPTT: 30 seconds (ref 24–36)

## 2020-04-19 LAB — PROTIME-INR
INR: 1 (ref 0.8–1.2)
Prothrombin Time: 12.5 seconds (ref 11.4–15.2)

## 2020-04-19 MED ORDER — SODIUM CHLORIDE 0.9% FLUSH: 3.0000 mL | Freq: Once | INTRAVENOUS | Status: DC

## 2020-04-19 NOTE — ED Notes (Signed)
Triage nurse notified of BP.

## 2020-04-19 NOTE — ED Triage Notes (Signed)
Pt arrives to ED w/ c/o L sided numbess that started 3 days ago. No unilateral weakness, facial droop, slurred speech. Pt AOx4 and otherwise neuro intact.

## 2020-04-20 ENCOUNTER — Emergency Department (HOSPITAL_BASED_OUTPATIENT_CLINIC_OR_DEPARTMENT_OTHER): Payer: Medicaid Other

## 2020-04-20 ENCOUNTER — Emergency Department (HOSPITAL_COMMUNITY): Payer: Medicaid Other

## 2020-04-20 DIAGNOSIS — R7989 Other specified abnormal findings of blood chemistry: Secondary | ICD-10-CM

## 2020-04-20 DIAGNOSIS — R609 Edema, unspecified: Secondary | ICD-10-CM

## 2020-04-20 LAB — D-DIMER, QUANTITATIVE: D-Dimer, Quant: 0.58 ug/mL-FEU — ABNORMAL HIGH (ref 0.00–0.50)

## 2020-04-20 NOTE — Discharge Instructions (Addendum)
As discussed, your evaluation today has been largely reassuring.  But, it is important that you monitor your condition carefully, and do not hesitate to return to the ED if you develop new, or concerning changes in your condition. ? ?Otherwise, please follow-up with your physician for appropriate ongoing care. ? ?

## 2020-04-20 NOTE — ED Provider Notes (Signed)
[facility] EMERGENCY DEPARTMENT Provider Note   CSN: [id number] Arrival date & time: 04/19/20  1414     History Chief Complaint  Patient presents with  . Stroke Symptoms    [name] [name] is a 64 y.o. male.  HPI    Patient with a history of tonsillar malignancy, now 2 years status post radiation therapy, presents with left numbness. Onset was 3 days ago, without clear precipitant. Since onset patient has had numbness in the left arm, shoulder, possibly left neck. Patient notes that he does have occasional dysesthesia in the face, this is unchanged.  Patient offers 1 possible precipitant as climbing over fences in his left arm, but now over the past 3 days has had tingling, numbness as above, as well as a reported palpable lesion in the left axilla. No fever, no dyspnea, no chest pain.  No relief with anything. Past Medical History:  Diagnosis Date  . Anxiety   . Cancer ([place]) 04/04/2016   cancer of left tonsil  . CKD (chronic kidney disease), stage III   . COPD (chronic obstructive pulmonary disease) ([place])   . Hepatitis   . Hepatitis C    Treatment x 8 weeks 6-7 months ago Harvoni  . History of radiation therapy 05/23/16- 07/10/16   Left Tonsil and bilateral neck  . Sinus congestion   . Stomach ulcer     Patient Active Problem List   Diagnosis Date Noted  . COPD with acute exacerbation ([place]) 02/10/2019  . Other fatigue 09/17/2017  . Neck stiffness 08/03/2017  . Multiple lung nodules on CT 12/05/2016  . Alcohol abuse 11/07/2016  . Multifocal pneumonia 10/04/2016  . Chronic kidney disease, stage III (moderate) 09/14/2016  . Dry mouth 09/14/2016  . Dysgeusia 09/05/2016  . Bronchitis 09/01/2016  . Anemia due to chronic illness 08/24/2016  . Acute prerenal failure ([place]) 07/24/2016  . Hemoptysis 07/24/2016  . Pancytopenia, acquired ([place]) 07/04/2016  . Atypical chest pain 06/27/2016  . Bilateral tinnitus 05/30/2016  . Hypersensitivity reaction  05/23/2016  . Lesion of right lung 04/26/2016  . 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[place]) 04/24/2016  . History of hepatitis C 04/24/2016  . Anxiety   . Dyspnea 03/25/2014  . COPD GOLD II 07/25/2013  . Sinusitis, chronic 07/25/2013  . Tobacco abuse 07/25/2013    Past Surgical History:  Procedure Laterality Date  . COLONOSCOPY WITH PROPOFOL N/A 04/14/2015   Procedure: COLONOSCOPY WITH PROPOFOL;  Surgeon: Arta Silence, MD;  Location: [facility];  Service: Endoscopy;  Laterality: N/A;  . HERNIA REPAIR    . IR GENERIC HISTORICAL  05/17/2016   IR FLUORO GUIDE PORT INSERTION RIGHT 05/17/2016 Jacqulynn Cadet, MD [facility]  . IR GENERIC HISTORICAL  05/17/2016   IR US GUIDE VASC ACCESS RIGHT 05/17/2016 Jacqulynn Cadet, MD [facility]  . IR GENERIC HISTORICAL  05/17/2016   IR GASTROSTOMY TUBE MOD SED 05/17/2016 Jacqulynn Cadet, MD [facility]  . IR GENERIC HISTORICAL  11/13/2016   IR GASTROSTOMY TUBE REMOVAL 11/13/2016 Greggory Keen, MD [facility]  . IR GENERIC HISTORICAL  11/13/2016   IR REMOVAL TUN ACCESS W/ PORT W/O FL MOD SED 11/13/2016 Greggory Keen, MD [facility]  . MULTIPLE EXTRACTIONS WITH ALVEOLOPLASTY N/A 05/03/2016   Procedure: Multiple extractions with alveoloplasty and gross debridement of remaining teeth.;  Surgeon: Lenn Cal, DDS;  Location: [facility];  Service: Oral Surgery;  Laterality: N/A;  . STOMACH SURGERY     20 years  Family History  Problem Relation Age of Onset  . Hypertension Mother   . Anxiety disorder Mother   . Diabetes Mother   . Cancer Father     Social History   Tobacco Use  . Smoking status: Current Some Day Smoker    Packs/day: 0.50    Years: 30.00    Pack years: 15.00    Types: Cigarettes  . Smokeless tobacco: Never Used  . Tobacco comment: says he hasn't smoked in 1 week. 09/12/2018  Substance Use Topics  . Alcohol use: Yes    Comment: QOD  . Drug use: No    Types: Cocaine    Home Medications Prior to Admission  medications   Medication Sig Start Date End Date Taking? Authorizing Provider  albuterol (VENTOLIN HFA) 108 (90 Base) MCG/ACT inhaler Inhale 2 puffs into the lungs every 4 (four) hours as needed for wheezing or shortness of breath. 02/10/19  Yes Collene Gobble, MD  aspirin EC 81 MG tablet Take 81 mg by mouth daily.   Yes [provider]    Allergies    Patient has no known allergies.  Review of Systems   Review of Systems  Constitutional:       Per HPI, otherwise negative  HENT:       Per HPI, otherwise negative  Respiratory:       Per HPI, otherwise negative  Cardiovascular:       Per HPI, otherwise negative  Gastrointestinal: Negative for vomiting.  Endocrine:       Negative aside from HPI  Genitourinary:       Neg aside from HPI   Musculoskeletal:       Per HPI, otherwise negative  Skin: Negative.   Allergic/Immunologic: Negative for immunocompromised state.  Neurological: Positive for numbness. Negative for syncope and weakness.    Physical Exam Updated Vital Signs BP (!) 145/103   Pulse 63   Temp 97.9 F (36.6 C) (Oral)   Resp 16   Ht 6' 1.5" (1.867 m)   Wt 61.2 kg   SpO2 97%   BMI 17.57 kg/m   Physical Exam Vitals and nursing note reviewed.  Constitutional:      General: He is not in acute distress.    Appearance: He is well-developed.  HENT:     Head: Normocephalic and atraumatic.  Eyes:     Conjunctiva/sclera: Conjunctivae normal.  Neck:     Comments: Changes consistent with prior radiation to left neck otherwise unremarkable Cardiovascular:     Rate and Rhythm: Normal rate and regular rhythm.  Pulmonary:     Effort: Pulmonary effort is normal. No respiratory distress.     Breath sounds: No stridor.  Abdominal:     General: There is no distension.  Musculoskeletal:       Arms:  Skin:    General: Skin is warm and dry.  Neurological:     Mental Status: He is alert and oriented to person, place, and time.     ED Results / Procedures  / Treatments   Labs (all labs ordered are listed, but only abnormal results are displayed) Labs Reviewed  CBC - Abnormal; Notable for the following components:      Result Value   RBC 3.93 (*)    MCV 100.3 (*)    All other components within normal limits  COMPREHENSIVE METABOLIC PANEL - Abnormal; Notable for the following components:   Creatinine, Ser 1.67 (*)    AST 48 (*)  GFR calc non Af Amer 44 (*)    GFR calc Af Amer 50 (*)    All other components within normal limits  D-DIMER, QUANTITATIVE (NOT AT [facility]) - Abnormal; Notable for the following components:   D-Dimer, Quant 0.58 (*)    All other components within normal limits  I-STAT CHEM 8, ED - Abnormal; Notable for the following components:   BUN 30 (*)    Creatinine, Ser 1.60 (*)    All other components within normal limits  PROTIME-INR  APTT  DIFFERENTIAL  CBG MONITORING, ED    EKG None  Radiology DG Chest 1 View  Result Date: 04/20/2020 CLINICAL DATA:  Left chest/shoulder pain EXAM: CHEST  1 VIEW COMPARISON:  CT chest dated 03/03/2019 FINDINGS: Biapical pleural-parenchymal scarring. Lungs are otherwise clear. No pleural effusion or pneumothorax. The heart is normal in size. IMPRESSION: No evidence of acute cardiopulmonary disease. Electronically Signed   By: Julian Hy M.D.   On: 04/20/2020 10:42   CT HEAD WO CONTRAST  Result Date: 04/19/2020 CLINICAL DATA:  Left-sided numbness for 3 days EXAM: CT HEAD WITHOUT CONTRAST TECHNIQUE: Contiguous axial images were obtained from the base of the skull through the vertex without intravenous contrast. COMPARISON:  04/04/2016 FINDINGS: Brain: No acute infarct or hemorrhage. Scattered hypodensities in the periventricular white matter consistent with chronic small vessel ischemic changes, not appreciably changed since previous MRI. Lateral ventricles and remaining midline structures are unremarkable. No acute extra-axial fluid collections. No mass effect. Vascular: No  hyperdense vessel or unexpected calcification. Skull: Normal. Negative for fracture or focal lesion. Sinuses/Orbits: No acute finding. Other: None IMPRESSION: 1. No acute intracranial process. Electronically Signed   By: Randa Ngo M.D.   On: 04/19/2020 16:54   DG Shoulder Left  Result Date: 04/20/2020 CLINICAL DATA:  Shoulder pain EXAM: LEFT SHOULDER - 2+ VIEW COMPARISON:  2018 FINDINGS: There is no evidence of fracture or dislocation. There is no evidence of arthropathy or other focal bone abnormality. Soft tissues are unremarkable. IMPRESSION: Negative. Electronically Signed   By: Macy Mis M.D.   On: 04/20/2020 10:35    Procedures Procedures (including critical care time)  Medications Ordered in ED Medications  sodium chloride flush (NS) 0.9 % injection 3 mL (has no administration in time range)    ED Course  I have reviewed the triage vital signs and the nursing notes.  Pertinent labs & imaging results that were available during my care of the patient were reviewed by me and considered in my medical decision making (see chart for details).    MDM Rules/Calculators/A&P                         11:15 AM Patient's initial x-rays are reassuring, CAT scan reviewed, notable for chronic changes, consistent with persistent hypertension. Initial labs notable for positive D-dimer and given the patient's description of left arm dysesthesia, some suspicion for clot, ultrasound ordered, pending. 2:46 PM Patient seen and evaluated again, I have discussed this case with our ultrasonographer, no evidence for DVT in the left side. Patient is awake, alert, sitting upright, hemodynamically unremarkable. He and I discussed today's findings including reassuring CT scan, ultrasound, labs.  Given the patient's history, return precautions and follow-up instructions were discussed as well, though there is no early evidence for CVA, DVT, PE, atypical ACS or other acute new findings.  Final Clinical  Impression(s) / ED Diagnoses Final diagnoses:  Numbness  Left arm pain  Rx / DC Orders ED Discharge Orders    None       Carmin Muskrat, MD 04/20/20 1447

## 2020-04-20 NOTE — Progress Notes (Signed)
Left upper extremity venous duplex complete.  Preliminary results given to Dr. Vanita Panda @ 14:00.  Please see CV Proc tab for preliminary results. [place], RVT 3:16 PM  04/20/2020

## 2020-04-21 ENCOUNTER — Telehealth: Payer: Self-pay

## 2020-04-21 NOTE — Telephone Encounter (Signed)
Called and given below message. He verbalized understanding. He is already scheduled to see PCP Monday. Instructed to call the office back if needed.

## 2020-04-21 NOTE — Telephone Encounter (Signed)
I have reviewed documentation from ER, they have done extensive tests, all good I do not have anything to add, I suggest he follows with primary doctor for review and BP management

## 2020-04-21 NOTE — Telephone Encounter (Signed)
He called and left a message to call him. He is having concerning symptoms and recently went to the ER.  Called back and left a message for him to call the office back.

## 2020-04-23 ENCOUNTER — Telehealth: Payer: Self-pay

## 2020-04-23 NOTE — Telephone Encounter (Signed)
He called and left a message to call him.  Called back. He is requesting a copy of last office note. Per Dr. Alvy Bimler, printed off and left a copy in envelope for him to pick up. He will come pick up.

## 2020-06-16 ENCOUNTER — Encounter (HOSPITAL_COMMUNITY): Payer: Self-pay | Admitting: Emergency Medicine

## 2020-06-16 ENCOUNTER — Ambulatory Visit (HOSPITAL_COMMUNITY)
Admission: EM | Admit: 2020-06-16 | Discharge: 2020-06-16 | Disposition: A | Discharge status: Home / Self Care | Payer: Medicaid Other | Attending: Family Medicine | Admitting: Family Medicine

## 2020-06-16 ENCOUNTER — Other Ambulatory Visit: Payer: Self-pay

## 2020-06-16 ENCOUNTER — Ambulatory Visit (INDEPENDENT_AMBULATORY_CARE_PROVIDER_SITE_OTHER): Payer: Medicaid Other

## 2020-06-16 DIAGNOSIS — M25511 Pain in right shoulder: Secondary | ICD-10-CM | POA: Diagnosis not present

## 2020-06-16 DIAGNOSIS — F419 Anxiety disorder, unspecified: Secondary | ICD-10-CM | POA: Insufficient documentation

## 2020-06-16 DIAGNOSIS — F17210 Nicotine dependence, cigarettes, uncomplicated: Secondary | ICD-10-CM | POA: Diagnosis not present

## 2020-06-16 DIAGNOSIS — J441 Chronic obstructive pulmonary disease with (acute) exacerbation: Secondary | ICD-10-CM | POA: Insufficient documentation

## 2020-06-16 DIAGNOSIS — U071 COVID-19: Secondary | ICD-10-CM | POA: Insufficient documentation

## 2020-06-16 DIAGNOSIS — D638 Anemia in other chronic diseases classified elsewhere: Secondary | ICD-10-CM | POA: Insufficient documentation

## 2020-06-16 DIAGNOSIS — Z7982 Long term (current) use of aspirin: Secondary | ICD-10-CM | POA: Insufficient documentation

## 2020-06-16 DIAGNOSIS — N183 Chronic kidney disease, stage 3 unspecified: Secondary | ICD-10-CM | POA: Diagnosis not present

## 2020-06-16 DIAGNOSIS — M25411 Effusion, right shoulder: Secondary | ICD-10-CM | POA: Diagnosis not present

## 2020-06-16 DIAGNOSIS — R059 Cough, unspecified: Secondary | ICD-10-CM | POA: Diagnosis present

## 2020-06-16 DIAGNOSIS — M19011 Primary osteoarthritis, right shoulder: Secondary | ICD-10-CM

## 2020-06-16 LAB — SARS CORONAVIRUS 2 (TAT 6-24 HRS): SARS Coronavirus 2: POSITIVE — AB

## 2020-06-16 NOTE — ED Provider Notes (Signed)
[facility]    CSN: [id number] Arrival date & time: 06/16/20  0840      History   Chief Complaint Chief Complaint  Patient presents with  . Cough    HPI [name] [name] is a 64 y.o. male.   Pt is a 64 year old male that presents with cough.  This has been present for the past week or so.  His mother has also been sick with similar symptoms became very ill.  He is concerned.  No fever, chills, body aches, night sweats, sore throat, chest pain or shortness of breath.  He is also had some pain and swelling to the right clavicle.  Denies any specific injuries.  No increased warmth, redness or drainage from the area.     Past Medical History:  Diagnosis Date  . Anxiety   . Cancer ([place]) 04/04/2016   cancer of left tonsil  . CKD (chronic kidney disease), stage III ([place])   . COPD (chronic obstructive pulmonary disease) ([place])   . Hepatitis   . Hepatitis C    Treatment x 8 weeks 6-7 months ago Harvoni  . History of radiation therapy 05/23/16- 07/10/16   Left Tonsil and bilateral neck  . Sinus congestion   . Stomach ulcer     Patient Active Problem List   Diagnosis Date Noted  . COPD with acute exacerbation ([place]) 02/10/2019  . Other fatigue 09/17/2017  . Neck stiffness 08/03/2017  . Multiple lung nodules on CT 12/05/2016  . Alcohol abuse 11/07/2016  . Multifocal pneumonia 10/04/2016  . Chronic kidney disease, stage III (moderate) ([place]) 09/14/2016  . Dry mouth 09/14/2016  . Dysgeusia 09/05/2016  . Bronchitis 09/01/2016  . Anemia due to chronic illness 08/24/2016  . Acute prerenal failure ([place]) 07/24/2016  . Hemoptysis 07/24/2016  . Pancytopenia, acquired ([place]) 07/04/2016  . Atypical chest pain 06/27/2016  . Bilateral tinnitus 05/30/2016  . Hypersensitivity reaction 05/23/2016  . Lesion of right lung 04/26/2016  . 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[place]) 04/24/2016  . History of hepatitis C 04/24/2016  . Anxiety   . Dyspnea 03/25/2014  . COPD GOLD II  07/25/2013  . Sinusitis, chronic 07/25/2013  . Tobacco abuse 07/25/2013    Past Surgical History:  Procedure Laterality Date  . COLONOSCOPY WITH PROPOFOL N/A 04/14/2015   Procedure: COLONOSCOPY WITH PROPOFOL;  Surgeon: Arta Silence, MD;  [facility];  Service: Endoscopy;  Laterality: N/A;  . HERNIA REPAIR    . IR GENERIC HISTORICAL  05/17/2016   IR FLUORO GUIDE PORT INSERTION RIGHT 05/17/2016 Jacqulynn Cadet, MD [facility]  . IR GENERIC HISTORICAL  05/17/2016   IR US GUIDE VASC ACCESS RIGHT 05/17/2016 Jacqulynn Cadet, MD [facility]  . IR GENERIC HISTORICAL  05/17/2016   IR GASTROSTOMY TUBE MOD SED 05/17/2016 Jacqulynn Cadet, MD [facility]  . IR GENERIC HISTORICAL  11/13/2016   IR GASTROSTOMY TUBE REMOVAL 11/13/2016 Greggory Keen, MD [facility]  . IR GENERIC HISTORICAL  11/13/2016   IR REMOVAL TUN ACCESS W/ PORT W/O FL MOD SED 11/13/2016 Greggory Keen, MD [facility]  . MULTIPLE EXTRACTIONS WITH ALVEOLOPLASTY N/A 05/03/2016   Procedure: Multiple extractions with alveoloplasty and gross debridement of remaining teeth.;  Surgeon: Lenn Cal, DDS;  Location[facility];  Service: Oral Surgery;  Laterality: N/A;  . STOMACH SURGERY     20 years       Home Medications    Prior to Admission medications   Medication Sig Start Date End Date  Taking? Authorizing Provider  albuterol (VENTOLIN HFA) 108 (90 Base) MCG/ACT inhaler Inhale 2 puffs into the lungs every 4 (four) hours as needed for wheezing or shortness of breath. 02/10/19   Collene Gobble, MD  aspirin EC 81 MG tablet Take 81 mg by mouth daily.    [provider]    Family History Family History  Problem Relation Age of Onset  . Hypertension Mother   . Anxiety disorder Mother   . Diabetes Mother   . Cancer Father     Social History Social History   Tobacco Use  . Smoking status: Current Some Day Smoker    Packs/day: 0.50    Years: 30.00    Pack years: 15.00    Types: Cigarettes  .  Smokeless tobacco: Never Used  . Tobacco comment: says he hasn't smoked in 1 week. 09/12/2018  Substance Use Topics  . Alcohol use: Yes    Comment: QOD  . Drug use: No    Types: Cocaine     Allergies   Patient has no known allergies.   Review of Systems Review of Systems   Physical Exam Triage Vital Signs ED Triage Vitals  Enc Vitals Group     BP 06/16/20 1036 (!) 153/108     Pulse Rate 06/16/20 1036 65     Resp 06/16/20 1036 16     Temp 06/16/20 1036 98.1 F (36.7 C)     Temp Source 06/16/20 1036 Oral     SpO2 06/16/20 1036 100 %     Weight --      Height --      Head Circumference --      Peak Flow --      Pain Score 06/16/20 1035 4     Pain Loc --      Pain Edu? --      Excl. in [place]? --    No data found.  Updated Vital Signs BP (!) 153/108 (BP Location: Left Arm)   Pulse 65   Temp 98.1 F (36.7 C) (Oral)   Resp 16   SpO2 100%   Visual Acuity Right Eye Distance:   Left Eye Distance:   Bilateral Distance:    Right Eye Near:   Left Eye Near:    Bilateral Near:     Physical Exam Vitals and nursing note reviewed.  Constitutional:      Appearance: Normal appearance.  HENT:     Head: Normocephalic and atraumatic.     Nose: Nose normal.  Eyes:     Conjunctiva/sclera: Conjunctivae normal.  Cardiovascular:     Rate and Rhythm: Normal rate and regular rhythm.     Pulses: Normal pulses.     Heart sounds: Normal heart sounds.  Pulmonary:     Effort: Pulmonary effort is normal.     Breath sounds: Normal breath sounds.  Chest:     Chest wall: Mass, swelling and tenderness present.       Comments: Swelling, TTP, fixed, hard palpable mass.  Normal color.  Abdominal:     Palpations: Abdomen is soft.  Musculoskeletal:        General: Normal range of motion.     Cervical back: Normal range of motion.  Skin:    General: Skin is warm and dry.  Neurological:     Mental Status: He is alert.  Psychiatric:        Mood and Affect: Mood normal.       UC Treatments / Results  Labs (all labs ordered are listed, but only abnormal results are displayed) Labs Reviewed  SARS CORONAVIRUS 2 (TAT 6-24 HRS)    EKG   Radiology DG Clavicle Right  Result Date: 06/16/2020 CLINICAL DATA:  Cough with pain and swelling of the clavicle region. EXAM: RIGHT CLAVICLE - 2+ VIEWS COMPARISON:  04/20/2020 FINDINGS: No focal finding of the clavicle. AC joint appears normal. There is probably some arthritis of the sternoclavicular joint. Mild pleural and parenchymal scarring at both lung apices. Shoulder region appears normal as seen. IMPRESSION: No focal finding. Probable arthritis of the sternoclavicular joint. Mild pleural and parenchymal scarring at the lung apices. Electronically Signed   By: Nelson Chimes M.D.   On: 06/16/2020 11:13    Procedures Procedures (including critical care time)  Medications Ordered in UC Medications - No data to display  Initial Impression / Assessment and Plan / UC Course  I have reviewed the triage vital signs and the nursing notes.  Pertinent labs & imaging results that were available during my care of the patient were reviewed by me and considered in my medical decision making (see chart for details).     Cough Lungs clear on exam.  Vital signs normal.  No concern at this time.  Most likely viral Over-the-counter medicines as needed for cough.  Recommend Mucinex  Arthritis right sternoclavicular joint. Palpable tender mass to clavicle No specific concerns today.  We will have him follow with his doctor on Monday for further management and evaluation Tylenol for pain as needed Follow up as needed for continued or worsening symptoms  Final Clinical Impressions(s) / UC Diagnoses   Final diagnoses:  Arthritis of right sternoclavicular joint  Cough     Discharge Instructions     Xray revealed what is most likely arthritis of the joint causing the swelling.  Recommend extra strength tylenol for  pain.  Your lungs are clear.  I do not believe that you have pneumonia.  Mucinex for the cough.  Follow up as needed for continued or worsening symptoms      ED Prescriptions    None     PDMP not reviewed this encounter.   Orvan July, NP 06/16/20 1206

## 2020-06-16 NOTE — Discharge Instructions (Signed)
Xray revealed what is most likely arthritis of the joint causing the swelling.  Recommend extra strength tylenol for pain.  Your lungs are clear.  I do not believe that you have pneumonia.  Mucinex for the cough.  Follow up as needed for continued or worsening symptoms

## 2020-06-16 NOTE — ED Triage Notes (Signed)
Pt presents with cough xs 7 days. C/o of right shoulder pain.

## 2020-06-17 ENCOUNTER — Other Ambulatory Visit: Payer: Self-pay | Admitting: Physician Assistant

## 2020-06-17 DIAGNOSIS — C090 Malignant neoplasm of tonsillar fossa: Secondary | ICD-10-CM

## 2020-06-17 DIAGNOSIS — J449 Chronic obstructive pulmonary disease, unspecified: Secondary | ICD-10-CM

## 2020-06-17 DIAGNOSIS — F1010 Alcohol abuse, uncomplicated: Secondary | ICD-10-CM

## 2020-06-17 DIAGNOSIS — U071 COVID-19: Secondary | ICD-10-CM

## 2020-06-17 NOTE — Progress Notes (Signed)
I connected by phone with [name] [name] on 06/17/2020 at 8:12 AM to discuss the potential use of a new treatment for mild to moderate COVID-19 viral infection in non-hospitalized patients.  This patient is a 64 y.o. male that meets the FDA criteria for Emergency Use Authorization of COVID monoclonal antibody casirivimab/imdevimab or bamlamivimab/estevimab.  Has a (+) direct SARS-CoV-2 viral test result  Has mild or moderate COVID-19   Is NOT hospitalized due to COVID-19  Is within 10 days of symptom onset  Has at least one of the high risk factor(s) for progression to severe COVID-19 and/or hospitalization as defined in EUA.  Specific high risk criteria : Immunosuppressive Disease or Treatment, Chronic Lung Disease and Other high risk medical condition per CDC:  high social vulnerablity index   I have spoken and communicated the following to the patient or parent/caregiver regarding COVID monoclonal antibody treatment:  1. FDA has authorized the emergency use for the treatment of mild to moderate COVID-19 in adults and pediatric patients with positive results of direct SARS-CoV-2 viral testing who are 43 years of age and older weighing at least 40 kg, and who are at high risk for progressing to severe COVID-19 and/or hospitalization.  2. The significant known and potential risks and benefits of COVID monoclonal antibody, and the extent to which such potential risks and benefits are unknown.  3. Information on available alternative treatments and the risks and benefits of those alternatives, including clinical trials.  4. Patients treated with COVID monoclonal antibody should continue to self-isolate and use infection control measures (e.g., wear mask, isolate, social distance, avoid sharing personal items, clean and disinfect "high touch" surfaces, and frequent handwashing) according to CDC guidelines.   5. The patient or parent/caregiver has the option to accept or refuse COVID  monoclonal antibody treatment.  After reviewing this information with the patient, the patient has agreed to receive one of the available covid 19 monoclonal antibodies and will be provided an appropriate fact sheet prior to infusion.  Sx onset 10/5. Set up for infusion on 10/15 @ 8:30am. Directions given to [facility]. Pt is aware that insurance will be charged an infusion fee. Pt is partially vaccinated. He will require transportation.  Angelena Form 06/17/2020 8:12 AM

## 2020-06-18 ENCOUNTER — Ambulatory Visit (HOSPITAL_COMMUNITY)
Admission: RE | Admit: 2020-06-18 | Discharge: 2020-06-18 | Disposition: A | Discharge status: Home / Self Care | Payer: Medicaid Other | Source: Ambulatory Visit | Attending: Pulmonary Disease | Admitting: Pulmonary Disease

## 2020-06-18 DIAGNOSIS — J449 Chronic obstructive pulmonary disease, unspecified: Secondary | ICD-10-CM

## 2020-06-18 DIAGNOSIS — F1010 Alcohol abuse, uncomplicated: Secondary | ICD-10-CM | POA: Diagnosis not present

## 2020-06-18 DIAGNOSIS — U071 COVID-19: Secondary | ICD-10-CM

## 2020-06-18 DIAGNOSIS — C090 Malignant neoplasm of tonsillar fossa: Secondary | ICD-10-CM | POA: Diagnosis present

## 2020-06-18 MED ORDER — EPINEPHRINE 0.3 MG/0.3ML IJ SOAJ: 0.3000 mg | Freq: Once | INTRAMUSCULAR | Status: DC | PRN

## 2020-06-18 MED ORDER — METHYLPREDNISOLONE SODIUM SUCC 125 MG IJ SOLR: 125.0000 mg | Freq: Once | INTRAMUSCULAR | Status: DC | PRN

## 2020-06-18 MED ORDER — SODIUM CHLORIDE 0.9 % IV SOLN: INTRAVENOUS | Status: DC | PRN

## 2020-06-18 MED ORDER — SODIUM CHLORIDE 0.9 % IV SOLN: Freq: Once | INTRAVENOUS | Status: AC

## 2020-06-18 MED ORDER — DIPHENHYDRAMINE HCL 50 MG/ML IJ SOLN: 50.0000 mg | Freq: Once | INTRAMUSCULAR | Status: DC | PRN

## 2020-06-18 MED ORDER — FAMOTIDINE IN NACL 20-0.9 MG/50ML-% IV SOLN: 20.0000 mg | Freq: Once | INTRAVENOUS | Status: DC | PRN

## 2020-06-18 MED ORDER — ALBUTEROL SULFATE HFA 108 (90 BASE) MCG/ACT IN AERS: 2.0000 | INHALATION_SPRAY | Freq: Once | RESPIRATORY_TRACT | Status: DC | PRN

## 2020-06-18 NOTE — Discharge Instructions (Signed)

## 2020-06-18 NOTE — Progress Notes (Signed)
  Diagnosis: COVID-19  Physician: Dr. Joya Gaskins  Procedure: [facility] Med: bamlanivimab\etesevimab infusion - Provided patient with bamlanimivab\etesevimab fact sheet for patients, parents and caregivers prior to infusion.  Complications: No immediate complications noted.  Discharge: Discharged home   [name] [name] 06/18/2020

## 2020-10-07 ENCOUNTER — Other Ambulatory Visit: Payer: Self-pay | Admitting: Hematology and Oncology

## 2020-10-07 DIAGNOSIS — R5383 Other fatigue: Secondary | ICD-10-CM

## 2020-10-07 DIAGNOSIS — C090 Malignant neoplasm of tonsillar fossa: Secondary | ICD-10-CM

## 2020-10-08 ENCOUNTER — Telehealth: Payer: Self-pay | Admitting: Hematology and Oncology

## 2020-10-08 ENCOUNTER — Inpatient Hospital Stay: Payer: Medicaid Other

## 2020-10-08 ENCOUNTER — Inpatient Hospital Stay: Payer: Medicaid Other | Admitting: Hematology and Oncology

## 2020-10-08 NOTE — Telephone Encounter (Signed)
Rescheduled per MD. Hulen Skains and spoke with pt, confirmed 2/11 appts

## 2020-10-15 ENCOUNTER — Inpatient Hospital Stay: Payer: Medicaid Other | Admitting: Hematology and Oncology

## 2020-10-15 ENCOUNTER — Inpatient Hospital Stay: Payer: Medicaid Other | Attending: Hematology and Oncology

## 2020-10-29 ENCOUNTER — Telehealth: Payer: Self-pay

## 2020-10-29 NOTE — Telephone Encounter (Signed)
He called to apologize for missing appts. His Mom has been sick. Rescheduled appts to 3/4. He is aware of appt date and time.  FYI

## 2020-11-05 ENCOUNTER — Other Ambulatory Visit: Payer: Self-pay

## 2020-11-05 ENCOUNTER — Telehealth: Payer: Self-pay

## 2020-11-05 ENCOUNTER — Inpatient Hospital Stay (HOSPITAL_BASED_OUTPATIENT_CLINIC_OR_DEPARTMENT_OTHER): Payer: Medicaid Other | Admitting: Hematology and Oncology

## 2020-11-05 ENCOUNTER — Inpatient Hospital Stay: Payer: Medicaid Other | Attending: Hematology and Oncology

## 2020-11-05 ENCOUNTER — Encounter: Payer: Self-pay | Admitting: Hematology and Oncology

## 2020-11-05 VITALS — BP 132/102 | HR 77 | Temp 97.7°F | Resp 18 | Ht 72.1 in | Wt 137.7 lb

## 2020-11-05 DIAGNOSIS — R5383 Other fatigue: Secondary | ICD-10-CM

## 2020-11-05 DIAGNOSIS — C090 Malignant neoplasm of tonsillar fossa: Secondary | ICD-10-CM

## 2020-11-05 DIAGNOSIS — R918 Other nonspecific abnormal finding of lung field: Secondary | ICD-10-CM | POA: Diagnosis not present

## 2020-11-05 DIAGNOSIS — Z923 Personal history of irradiation: Secondary | ICD-10-CM | POA: Insufficient documentation

## 2020-11-05 DIAGNOSIS — I10 Essential (primary) hypertension: Secondary | ICD-10-CM | POA: Diagnosis not present

## 2020-11-05 DIAGNOSIS — Z720 Tobacco use: Secondary | ICD-10-CM | POA: Diagnosis not present

## 2020-11-05 DIAGNOSIS — Z79899 Other long term (current) drug therapy: Secondary | ICD-10-CM | POA: Insufficient documentation

## 2020-11-05 DIAGNOSIS — Z9221 Personal history of antineoplastic chemotherapy: Secondary | ICD-10-CM | POA: Diagnosis not present

## 2020-11-05 DIAGNOSIS — Z7982 Long term (current) use of aspirin: Secondary | ICD-10-CM | POA: Diagnosis not present

## 2020-11-05 DIAGNOSIS — Z85818 Personal history of malignant neoplasm of other sites of lip, oral cavity, and pharynx: Secondary | ICD-10-CM | POA: Diagnosis not present

## 2020-11-05 DIAGNOSIS — N183 Chronic kidney disease, stage 3 unspecified: Secondary | ICD-10-CM

## 2020-11-05 LAB — CBC WITH DIFFERENTIAL/PLATELET
Abs Immature Granulocytes: 0.01 10*3/uL (ref 0.00–0.07)
Basophils Absolute: 0 10*3/uL (ref 0.0–0.1)
Basophils Relative: 1 %
Eosinophils Absolute: 0.2 10*3/uL (ref 0.0–0.5)
Eosinophils Relative: 4 %
HCT: 43.2 % (ref 39.0–52.0)
Hemoglobin: 15 g/dL (ref 13.0–17.0)
Immature Granulocytes: 0 %
Lymphocytes Relative: 27 %
Lymphs Abs: 1.4 10*3/uL (ref 0.7–4.0)
MCH: 32.9 pg (ref 26.0–34.0)
MCHC: 34.7 g/dL (ref 30.0–36.0)
MCV: 94.7 fL (ref 80.0–100.0)
Monocytes Absolute: 0.7 10*3/uL (ref 0.1–1.0)
Monocytes Relative: 15 %
Neutro Abs: 2.6 10*3/uL (ref 1.7–7.7)
Neutrophils Relative %: 53 %
Platelets: 245 10*3/uL (ref 150–400)
RBC: 4.56 MIL/uL (ref 4.22–5.81)
RDW: 12.8 % (ref 11.5–15.5)
WBC: 4.9 10*3/uL (ref 4.0–10.5)
nRBC: 0 % (ref 0.0–0.2)

## 2020-11-05 LAB — COMPREHENSIVE METABOLIC PANEL
ALT: 25 U/L (ref 0–44)
AST: 57 U/L — ABNORMAL HIGH (ref 15–41)
Albumin: 4.4 g/dL (ref 3.5–5.0)
Alkaline Phosphatase: 85 U/L (ref 38–126)
Anion gap: 11 (ref 5–15)
BUN: 20 mg/dL (ref 8–23)
CO2: 28 mmol/L (ref 22–32)
Calcium: 9.7 mg/dL (ref 8.9–10.3)
Chloride: 104 mmol/L (ref 98–111)
Creatinine, Ser: 1.66 mg/dL — ABNORMAL HIGH (ref 0.61–1.24)
GFR, Estimated: 46 mL/min — ABNORMAL LOW (ref >60)
Glucose, Bld: 91 mg/dL (ref 70–99)
Potassium: 4.8 mmol/L (ref 3.5–5.1)
Sodium: 143 mmol/L (ref 135–145)
Total Bilirubin: 0.7 mg/dL (ref 0.3–1.2)
Total Protein: 8.2 g/dL — ABNORMAL HIGH (ref 6.5–8.1)

## 2020-11-05 LAB — TSH: TSH: 2.248 u[IU]/mL (ref 0.320–4.118)

## 2020-11-05 NOTE — Telephone Encounter (Signed)
Called and scheduled appt for 0820 on 3/14. He is aware of appt time.

## 2020-11-05 NOTE — Assessment & Plan Note (Signed)
He has history of pulmonary nodules in the lung I will order CT imaging to evaluate The patient is still smoking

## 2020-11-05 NOTE — Telephone Encounter (Signed)
-----   Message from Heath Lark, MD sent at 11/05/2020 11:35 AM EST ----- Regarding: appt on 3/14 Pls call him to schedule appt on 3/14 to review CT result

## 2020-11-05 NOTE — Progress Notes (Signed)
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[facility]) His ENT exam is unremarkable However, I am very concerned about palpable sternal mass I will order CT imaging of the chest for evaluation and we will see him back after that  Chronic kidney disease, stage III (moderate) He has significant chronic kidney disease with uncontrolled hypertension We discussed the importance of getting his blood pressure under control  Multiple lung nodules on CT He has history of pulmonary nodules in the lung I will order CT imaging to evaluate The patient is still smoking  Tobacco abuse The patient continues to smoke on a regular basis He has signs of emphysema on CT imaging We discussed the importance of nicotine cessation   Orders Placed This Encounter  Procedures  . CT Chest Wo Contrast    Standing Status:   Future    Standing Expiration Date:   11/05/2021    Order Specific Question:   Preferred imaging location?    Answer:   [facility]    All questions were answered. The patient knows to call the clinic with any problems, questions or concerns. The total time spent in the appointment was 30 minutes encounter with patients including review of chart and various tests results, discussions about plan of care and coordination of care plan   [name] Lark, MD 11/05/2020 11:33 AM  INTERVAL HISTORY: Please see below for problem oriented charting. He missed his appointment recently He is concerned now  because he palpated a mass near the sternum on the right side He is still smoking and drinking which he attributed to stress taking care of his mother No palpable lymphadenopathy elsewhere His appetite is fair and he denies weight loss He was prescribed medication for hypertension but did not pick up his prescription  SUMMARY OF ONCOLOGIC HISTORY: Oncology History  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[place])  04/04/2016 Initial Diagnosis   He saw ENT and had laryngoscopy and biopsy of left tonsil   04/04/2016 Pathology Results   [id number] 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 Chronic small-vessel ischemic changes of the cerebral hemispheric white matter.    04/04/2016 Imaging   CT neck showed left tonsillar mass with diameter of 19 x 24 mm consistent with 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 necrosis. Suspicious level 5/supraclavicular nodes on the left. Scar type density in the right upper lobe is more prominent than was seen on a CT scan of the chest 10/06/2014   04/21/2016 PET scan   Asymmetric hypermetabolic soft tissue prominence in left palatine tonsil, consistent with known primary left tonsillar squamous cell Carcinoma. Left level 2 cervical lymphadenopathy, consistent with metastatic disease. No evidence of metastatic disease within the chest, abdomen, or pelvis. Stable 14 mm ground-glass and part solid nodular opacity in right lung apex compared to previous CT in 2016. This shows low-grade metabolic activity. Low-grade bronchogenic adenocarcinoma cannot be excluded. Consider surgical resection versus continued followup by CT in 12 months   05/17/2016 Procedure  Successful placement of a 20 French pull through gastrostomy tube & right IJ approach [facility] [place].   05/22/2016 - 07/03/2016 Chemotherapy   He received 3 doses of high dose cisplatin with  radiation    05/23/2016 - 07/10/2016 Radiation Therapy   Rec'd Helical IMRT:  Left tonsil and bilateral neck / 70 Gy in 35 fractions to gross disease, 63 Gy in 35 fractions to high risk nodal echelons, and 56 Gy in 35 fractions to intermediate risk nodal echelons.       10/04/2016 - 10/07/2016 Hospital Admission   He was admitted due to atypical chest pain from illicit drug use. CT scan showed possible atypical pneumonia versus radiation pneumonitis   10/04/2016 Imaging   Ct scan showed no evidence of pulmonary embolus. 2. Patchy bilateral airspace opacities, at the upper lobes, compatible with multifocal pneumonia. This corresponds to the finding on recent chest radiograph. 3. Previously noted ground-glass and solid nodule at the right lung apex demonstrates increased solid components. The solid component now measures 1.2 cm. 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 Tissue diagnosis would be helpful, as deemed clinically appropriate.   10/26/2016 PET scan   Slight increasing solid component of a right apical lung lesion but no concerning hypermetabolism. Recommend continued surveillance. 2. Bilateral patchy apical airspace opacities are mild to moderately hypermetabolic and likely reflect infection. Recommend short-term followup noncontrast chest CT in 3 months to reassess. 3. No mediastinal or hilar mass or adenopathy. 4. No significant findings in the abdomen/pelvis. 5. Stable lucencies throughout the bony pelvis but no hypermetabolism to suggest metastasis.   11/13/2016 Procedure   Successful right IJ vein Port-A-Cath explant.   11/13/2016 Procedure   Uncomplicated gastrostomy removal   01/23/2017 Imaging   Ct neck: 1. Sequelae of radiation therapy in the neck. The oropharynx level of this neck CT is degraded by motion artifact. Still, soft tissue volume has definitely decreased at the level of the 2017 left tonsillar mass, suggesting  positive response to treatment and concordant with the negative findings on the February PET-CT. 2. Normalized left level 2 metastatic lymph node. No abnormal cervical lymph nodes today. 3. Chest CT findings today are reported separately.   01/23/2017 Imaging   Ct chest: 1. Stable biapical pleural and parenchymal scarring changes with patchy airspace opacities and interstitial thickening most likely representing postinflammatory/postinfectious scarring changes. No new pulmonary lesions to suggest metastatic disease. Recommend six-month follow-up noncontrast chest CT to reassess. 2. Stable emphysematous changes. 3. No mediastinal or hilar mass or adenopathy.   08/02/2017 Imaging   1. Irregular subsolid apical right upper lobe 1.8 cm pulmonary nodule is stable in size with slightly increased solid component in the interval. This nodule has increased in density and mildly increased in size since 10/06/2014 chest CT, which pre-dates the history of radiation therapy for the left tonsillar malignancy, which was diagnosed in August 2017. As such, an indolent/low grade primary bronchogenic adenocarcinoma remains on the differential. Continued surveillance is advised if surgical management or biopsy is not elected at this time. 2. Separate biapical lung opacities demonstrate changes suggestive of evolving postinflammatory change such as due to prior radiation therapy. 3. No thoracic adenopathy or other findings suggestive of metastatic disease in the chest.   09/17/2017 PET scan   1. No findings to suggest recurrent or metastatic head/neck primary. 2. Right apical mixed attenuation nodule is similar in size to most recent CT and similar in hypermetabolism compared to 10/26/2016. As on the recent  CT, slow interval growth suggests indolent neoplasm such as low-grade adenocarcinoma. 3. Aortic Atherosclerosis (ICD10-I70.0). 4. Stable appearance of non FDG avid lucencies throughout the bony pelvis. These warrant  ongoing follow-up attention. 5. Trace cul-de-sac fluid, new or increased since the prior PET.   08/30/2018 Imaging   1. Stable biapical pleural and parenchymal scarring changes with stable nodular density at the right lung apex. No new or progressive or worrisome findings. Recommend follow-up noncontrast chest CT in 12 months. 2. Stable emphysematous changes. No acute pulmonary findings or new pulmonary nodules. 3. No mediastinal or hilar mass or adenopathy.    03/03/2019 Imaging   1. Asymmetric nodular lesion in the apical segment of the right upper lobe is stable from 01/23/2018 but appears slightly more prominent than on 09/17/2017. Additional follow-up in 1 year is recommended, as clinically indicated. 2.  Aortic atherosclerosis (ICD10-170.0). 3.  Emphysema (ICD10-J43.9).       REVIEW OF SYSTEMS:   Constitutional: Denies fevers, chills or abnormal weight loss Eyes: Denies blurriness of vision Ears, nose, mouth, throat, and face: Denies mucositis or sore throat Respiratory: Denies cough, dyspnea or wheezes Cardiovascular: Denies palpitation, chest discomfort or lower extremity swelling Gastrointestinal:  Denies nausea, heartburn or change in bowel habits Skin: Denies abnormal skin rashes Lymphatics: Denies new lymphadenopathy or easy bruising Neurological:Denies numbness, tingling or new weaknesses Behavioral/Psych: Mood is stable, no new changes  All other systems were reviewed with the patient and are negative.  I have reviewed the past medical history, past surgical history, social history and family history with the patient and they are unchanged from previous note.  ALLERGIES:  has No Known Allergies.  MEDICATIONS:  Current Outpatient Medications  Medication Sig Dispense Refill  . albuterol (VENTOLIN HFA) 108 (90 Base) MCG/ACT inhaler Inhale 2 puffs into the lungs every 4 (four) hours as needed for wheezing or shortness of breath. 1 Inhaler 1  . aspirin EC 81 MG tablet  Take 81 mg by mouth daily.     No current facility-administered medications for this visit.    PHYSICAL EXAMINATION: ECOG PERFORMANCE STATUS: 1 - Symptomatic but completely ambulatory  Vitals:   11/05/20 0849  BP: (!) 132/102  Pulse: 77  Resp: 18  Temp: 97.7 F (36.5 C)  SpO2: 100%   Filed Weights   11/05/20 0849  Weight: 137 lb 11.2 oz (62.5 kg)    GENERAL:alert, no distress and comfortable SKIN: skin color, texture, turgor are normal, no rashes or significant lesions EYES: normal, Conjunctiva are pink and non-injected, sclera clear OROPHARYNX:no exudate, no erythema and lips, buccal mucosa, and tongue normal  NECK: supple, thyroid normal size, non-tender, without nodularity LYMPH:  no palpable lymphadenopathy in the cervical, axillary or inguinal LUNGS: clear to auscultation and percussion with normal breathing effort HEART: regular rate & rhythm and no murmurs and no lower extremity edema ABDOMEN:abdomen soft, non-tender and normal bowel sounds Musculoskeletal:no cyanosis of digits and no clubbing.  He has palpable enlarged sternal mass at the junction between the clavicle and the sternum NEURO: alert & oriented x 3 with fluent speech, no focal motor/sensory deficits  LABORATORY DATA:  I have reviewed the data as listed    Component Value Date/Time   NA 143 11/05/2020 0833   NA 139 07/30/2017 0802   K 4.8 11/05/2020 0833   K 4.9 07/30/2017 0802   CL 104 11/05/2020 0833   CO2 28 11/05/2020 0833   CO2 27 07/30/2017 0802   GLUCOSE 91 11/05/2020 0833   GLUCOSE 97  07/30/2017 0802   BUN 20 11/05/2020 0833   BUN 21.0 07/30/2017 0802   CREATININE 1.66 (H) 11/05/2020 0833   CREATININE 1.86 (H) 10/09/2019 0801   CREATININE 1.6 (H) 07/30/2017 0802   CALCIUM 9.7 11/05/2020 0833   CALCIUM 10.1 07/30/2017 0802   PROT 8.2 (H) 11/05/2020 0833   PROT 8.0 07/30/2017 0802   ALBUMIN 4.4 11/05/2020 0833   ALBUMIN 4.3 07/30/2017 0802   AST 57 (H) 11/05/2020 0833   AST 59 (H)  10/09/2019 0801   AST 27 07/30/2017 0802   ALT 25 11/05/2020 0833   ALT 29 10/09/2019 0801   ALT 16 07/30/2017 0802   ALKPHOS 85 11/05/2020 0833   ALKPHOS 64 07/30/2017 0802   BILITOT 0.7 11/05/2020 0833   BILITOT 0.6 10/09/2019 0801   BILITOT 0.52 07/30/2017 0802   GFRNONAA 46 (L) 11/05/2020 0833   GFRNONAA 38 (L) 10/09/2019 0801   GFRAA 50 (L) 04/19/2020 1506   GFRAA 44 (L) 10/09/2019 0801    No results found for: SPEP, UPEP  Lab Results  Component Value Date   WBC 4.9 11/05/2020   NEUTROABS 2.6 11/05/2020   HGB 15.0 11/05/2020   HCT 43.2 11/05/2020   MCV 94.7 11/05/2020   PLT 245 11/05/2020      Chemistry      Component Value Date/Time   NA 143 11/05/2020 0833   NA 139 07/30/2017 0802   K 4.8 11/05/2020 0833   K 4.9 07/30/2017 0802   CL 104 11/05/2020 0833   CO2 28 11/05/2020 0833   CO2 27 07/30/2017 0802   BUN 20 11/05/2020 0833   BUN 21.0 07/30/2017 0802   CREATININE 1.66 (H) 11/05/2020 0833   CREATININE 1.86 (H) 10/09/2019 0801   CREATININE 1.6 (H) 07/30/2017 0802      Component Value Date/Time   CALCIUM 9.7 11/05/2020 0833   CALCIUM 10.1 07/30/2017 0802   ALKPHOS 85 11/05/2020 0833   ALKPHOS 64 07/30/2017 0802   AST 57 (H) 11/05/2020 0833   AST 59 (H) 10/09/2019 0801   AST 27 07/30/2017 0802   ALT 25 11/05/2020 0833   ALT 29 10/09/2019 0801   ALT 16 07/30/2017 0802   BILITOT 0.7 11/05/2020 0833   BILITOT 0.6 10/09/2019 0801   BILITOT 0.52 07/30/2017 0802

## 2020-11-05 NOTE — Assessment & Plan Note (Signed)
The patient continues to smoke on a regular basis He has signs of emphysema on CT imaging We discussed the importance of nicotine cessation

## 2020-11-05 NOTE — Assessment & Plan Note (Signed)
His ENT exam is unremarkable However, I am very concerned about palpable sternal mass I will order CT imaging of the chest for evaluation and we will see him back after that

## 2020-11-05 NOTE — Assessment & Plan Note (Signed)
He has significant chronic kidney disease with uncontrolled hypertension We discussed the importance of getting his blood pressure under control

## 2020-11-12 ENCOUNTER — Ambulatory Visit (HOSPITAL_COMMUNITY)
Admission: RE | Admit: 2020-11-12 | Discharge: 2020-11-12 | Disposition: A | Discharge status: Home / Self Care | Payer: Medicaid Other | Source: Ambulatory Visit | Attending: Hematology and Oncology | Admitting: Hematology and Oncology

## 2020-11-12 ENCOUNTER — Other Ambulatory Visit: Payer: Self-pay

## 2020-11-12 DIAGNOSIS — C090 Malignant neoplasm of tonsillar fossa: Secondary | ICD-10-CM | POA: Diagnosis present

## 2020-11-12 DIAGNOSIS — R918 Other nonspecific abnormal finding of lung field: Secondary | ICD-10-CM | POA: Diagnosis present

## 2020-11-15 ENCOUNTER — Telehealth: Payer: Self-pay | Admitting: Hematology and Oncology

## 2020-11-15 ENCOUNTER — Other Ambulatory Visit: Payer: Self-pay

## 2020-11-15 ENCOUNTER — Inpatient Hospital Stay (HOSPITAL_BASED_OUTPATIENT_CLINIC_OR_DEPARTMENT_OTHER): Payer: Medicaid Other | Admitting: Hematology and Oncology

## 2020-11-15 ENCOUNTER — Encounter: Payer: Self-pay | Admitting: Hematology and Oncology

## 2020-11-15 DIAGNOSIS — I10 Essential (primary) hypertension: Secondary | ICD-10-CM | POA: Diagnosis not present

## 2020-11-15 DIAGNOSIS — R918 Other nonspecific abnormal finding of lung field: Secondary | ICD-10-CM | POA: Diagnosis not present

## 2020-11-15 DIAGNOSIS — C090 Malignant neoplasm of tonsillar fossa: Secondary | ICD-10-CM

## 2020-11-15 DIAGNOSIS — Z85818 Personal history of malignant neoplasm of other sites of lip, oral cavity, and pharynx: Secondary | ICD-10-CM | POA: Diagnosis not present

## 2020-11-15 NOTE — Progress Notes (Signed)
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[place]) Clinically, he has no signs of cancer recurrence The CT imaging showed enlarging lung nodule but that happened over a span of 2 years, minimally changed He is not symptomatic He is noncompliant with follow-up with pulmonologist and noncompliant with recommendation to stop smoking and drinking I recommend repeat imaging study again in 6 months for follow-up  Multiple lung nodules on CT The lung nodule on the right apex is slowly enlarged but he is not symptomatic We discussed the risk and benefits of biopsy versus observation and he is in agreement to be observe with repeat CT imaging in 6 months If it continues to grow, I will refer him back to pulmonologist for possible biopsy  Essential hypertension He has uncontrolled hypertension He claims he is taking his blood pressure medication as directed We discussed the importance of risk factor modification to prevent renal failure   No orders of the defined types were placed in this encounter.   All questions were answered. The patient knows to call the clinic with any problems, questions or concerns. The total time spent in the appointment was 20 minutes encounter with patients including review of chart and various tests results, discussions about plan of care and coordination of care plan   Heath Lark, MD 11/15/2020 9:22 AM  INTERVAL HISTORY: Please see below for problem oriented  charting. 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 Chronic small-vessel ischemic changes of the cerebral hemispheric white matter.    04/04/2016 Imaging   CT neck showed left tonsillar mass with diameter of 19 x 24 mm consistent with 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 necrosis. Suspicious level 5/supraclavicular nodes on the left. Scar type density in the right upper lobe is more prominent than was seen on a CT scan of the chest 10/06/2014   04/21/2016 PET scan   Asymmetric hypermetabolic soft tissue prominence in left palatine tonsil, consistent with known primary left tonsillar squamous cell Carcinoma. Left level 2 cervical lymphadenopathy, consistent with metastatic disease. No evidence of metastatic disease within the chest, abdomen, or pelvis. Stable 14 mm ground-glass and part solid nodular opacity in right lung apex compared to previous CT in 2016. This shows low-grade metabolic activity. Low-grade bronchogenic adenocarcinoma cannot be excluded. Consider surgical resection versus continued followup by CT in 12 months   05/17/2016 Procedure   Successful placement of a 20 French pull through gastrostomy tube & right IJ approach [place].   05/22/2016 - 07/03/2016 Chemotherapy   He received 3 doses of high dose cisplatin with radiation    05/23/2016 - 07/10/2016 Radiation Therapy   Rec'd Helical IMRT:  Left tonsil and bilateral neck / 70 Gy in  35 fractions to gross disease, 63 Gy in 35 fractions to high risk nodal echelons, and 56 Gy in 35 fractions to intermediate risk nodal  echelons.       10/04/2016 - 10/07/2016 Hospital Admission   He was admitted due to atypical chest pain from illicit drug use. CT scan showed possible atypical pneumonia versus radiation pneumonitis   10/04/2016 Imaging   Ct scan showed no evidence of pulmonary embolus. 2. Patchy bilateral airspace opacities, at the upper lobes, compatible with multifocal pneumonia. This corresponds to the finding on recent chest radiograph. 3. Previously noted ground-glass and solid nodule at the right lung apex demonstrates increased solid components. The solid component now measures 1.2 cm. 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 Tissue diagnosis would be helpful, as deemed clinically appropriate.   10/26/2016 PET scan   Slight increasing solid component of a right apical lung lesion but no concerning hypermetabolism. Recommend continued surveillance. 2. Bilateral patchy apical airspace opacities are mild to moderately hypermetabolic and likely reflect infection. Recommend short-term followup noncontrast chest CT in 3 months to reassess. 3. No mediastinal or hilar mass or adenopathy. 4. No significant findings in the abdomen/pelvis. 5. Stable lucencies throughout the bony pelvis but no hypermetabolism to suggest metastasis.   11/13/2016 Procedure   Successful right IJ vein Port-A-Cath explant.   11/13/2016 Procedure   Uncomplicated gastrostomy removal   01/23/2017 Imaging   Ct neck: 1. Sequelae of radiation therapy in the neck. The oropharynx level of this neck CT is degraded by motion artifact. Still, soft tissue volume has definitely decreased at the level of the 2017 left tonsillar mass, suggesting positive response to treatment and concordant with the negative findings on the February PET-CT. 2. Normalized left level 2 metastatic lymph node. No abnormal cervical lymph nodes today. 3. Chest CT findings today are reported separately.   01/23/2017 Imaging    Ct chest: 1. Stable biapical pleural and parenchymal scarring changes with patchy airspace opacities and interstitial thickening most likely representing postinflammatory/postinfectious scarring changes. No new pulmonary lesions to suggest metastatic disease. Recommend six-month follow-up noncontrast chest CT to reassess. 2. Stable emphysematous changes. 3. No mediastinal or hilar mass or adenopathy.   08/02/2017 Imaging   1. Irregular subsolid apical right upper lobe 1.8 cm pulmonary nodule is stable in size with slightly increased solid component in the interval. This nodule has increased in density and mildly increased in size since 10/06/2014 chest CT, which pre-dates the history of radiation therapy for the left tonsillar malignancy, which was diagnosed in August 2017. As such, an indolent/low grade primary bronchogenic adenocarcinoma remains on the differential. Continued surveillance is advised if surgical management or biopsy is not elected at this time. 2. Separate biapical lung opacities demonstrate changes suggestive of evolving postinflammatory change such as due to prior radiation therapy. 3. No thoracic adenopathy or other findings suggestive of metastatic disease in the chest.   09/17/2017 PET scan   1. No findings to suggest recurrent or metastatic head/neck primary. 2. Right apical mixed attenuation nodule is similar in size to most recent CT and similar in hypermetabolism compared to 10/26/2016. As on the recent CT, slow interval growth suggests indolent neoplasm such as low-grade adenocarcinoma. 3. Aortic Atherosclerosis (ICD10-I70.0). 4. Stable appearance of non FDG avid lucencies throughout the bony pelvis. These warrant ongoing follow-up attention. 5. Trace cul-de-sac fluid, new or increased since the prior PET.   08/30/2018 Imaging   1. Stable biapical pleural and parenchymal scarring changes  with stable nodular density at the right lung apex. No new or progressive or worrisome  findings. Recommend follow-up noncontrast chest CT in 12 months. 2. Stable emphysematous changes. No acute pulmonary findings or new pulmonary nodules. 3. No mediastinal or hilar mass or adenopathy.    03/03/2019 Imaging   1. Asymmetric nodular lesion in the apical segment of the right upper lobe is stable from 01/23/2018 but appears slightly more prominent than on 09/17/2017. Additional follow-up in 1 year is recommended, as clinically indicated. 2.  Aortic atherosclerosis (ICD10-170.0). 3.  Emphysema (ICD10-J43.9).     11/12/2020 Imaging   Nodular area amidst scarring in the RIGHT upper lobe shows little change but increased density in size since 2020 and perhaps slightly increased size compared to most recent imaging. Low-grade bronchogenic neoplasm in addition to of all vein post radiation changes in the setting of prior head and neck cancer are considered.   Mild pulmonary emphysema.   Mild aortic atherosclerosis.   Aortic Atherosclerosis (ICD10-I70.0) and Emphysema (ICD10-J43.9).     REVIEW OF SYSTEMS:   Constitutional: Denies fevers, chills or abnormal weight loss Eyes: Denies blurriness of vision Ears, nose, mouth, throat, and face: Denies mucositis or sore throat Respiratory: Denies cough, dyspnea or wheezes Cardiovascular: Denies palpitation, chest discomfort or lower extremity swelling Gastrointestinal:  Denies nausea, heartburn or change in bowel habits Skin: Denies abnormal skin rashes Lymphatics: Denies new lymphadenopathy or easy bruising Neurological:Denies numbness, tingling or new weaknesses Behavioral/Psych: Mood is stable, no new changes  All other systems were reviewed with the patient and are negative.  I have reviewed the past medical history, past surgical history, social history and family history with the patient and they are unchanged from previous note.  ALLERGIES:  has No Known Allergies.  MEDICATIONS:  Current Outpatient Medications  Medication  Sig Dispense Refill  . amLODipine (NORVASC) 5 MG tablet Take 5 mg by mouth daily.    Marland Kitchen lisinopril (ZESTRIL) 5 MG tablet Take 5 mg by mouth daily.    Marland Kitchen albuterol (VENTOLIN HFA) 108 (90 Base) MCG/ACT inhaler Inhale 2 puffs into the lungs every 4 (four) hours as needed for wheezing or shortness of breath. 1 Inhaler 1  . aspirin EC 81 MG tablet Take 81 mg by mouth daily.     No current facility-administered medications for this visit.    PHYSICAL EXAMINATION: ECOG PERFORMANCE STATUS: 1 - Symptomatic but completely ambulatory  Vitals:   11/15/20 0826 11/15/20 0831  BP: (!) 149/101 (!) 155/106  Pulse: 69   Resp: 18   Temp: (!) 96.7 F (35.9 C)   SpO2: 100%    Filed Weights   11/15/20 0826  Weight: 139 lb 3.2 oz (63.1 kg)    GENERAL:alert, no distress and comfortable  NEURO: alert & oriented x 3 with fluent speech, no focal motor/sensory deficits  LABORATORY DATA:  I have reviewed the data as listed    Component Value Date/Time   NA 143 11/05/2020 0833   NA 139 07/30/2017 0802   K 4.8 11/05/2020 0833   K 4.9 07/30/2017 0802   CL 104 11/05/2020 0833   CO2 28 11/05/2020 0833   CO2 27 07/30/2017 0802   GLUCOSE 91 11/05/2020 0833   GLUCOSE 97 07/30/2017 0802   BUN 20 11/05/2020 0833   BUN 21.0 07/30/2017 0802   CREATININE 1.66 (H) 11/05/2020 0833   CREATININE 1.86 (H) 10/09/2019 0801   CREATININE 1.6 (H) 07/30/2017 0802   CALCIUM 9.7 11/05/2020 0833   CALCIUM 10.1 07/30/2017  0802   PROT 8.2 (H) 11/05/2020 0833   PROT 8.0 07/30/2017 0802   ALBUMIN 4.4 11/05/2020 0833   ALBUMIN 4.3 07/30/2017 0802   AST 57 (H) 11/05/2020 0833   AST 59 (H) 10/09/2019 0801   AST 27 07/30/2017 0802   ALT 25 11/05/2020 0833   ALT 29 10/09/2019 0801   ALT 16 07/30/2017 0802   ALKPHOS 85 11/05/2020 0833   ALKPHOS 64 07/30/2017 0802   BILITOT 0.7 11/05/2020 0833   BILITOT 0.6 10/09/2019 0801   BILITOT 0.52 07/30/2017 0802   GFRNONAA 46 (L) 11/05/2020 0833   GFRNONAA 38 (L) 10/09/2019  0801   GFRAA 50 (L) 04/19/2020 1506   GFRAA 44 (L) 10/09/2019 0801    No results found for: SPEP, UPEP  Lab Results  Component Value Date   WBC 4.9 11/05/2020   NEUTROABS 2.6 11/05/2020   HGB 15.0 11/05/2020   HCT 43.2 11/05/2020   MCV 94.7 11/05/2020   PLT 245 11/05/2020      Chemistry      Component Value Date/Time   NA 143 11/05/2020 0833   NA 139 07/30/2017 0802   K 4.8 11/05/2020 0833   K 4.9 07/30/2017 0802   CL 104 11/05/2020 0833   CO2 28 11/05/2020 0833   CO2 27 07/30/2017 0802   BUN 20 11/05/2020 0833   BUN 21.0 07/30/2017 0802   CREATININE 1.66 (H) 11/05/2020 0833   CREATININE 1.86 (H) 10/09/2019 0801   CREATININE 1.6 (H) 07/30/2017 0802      Component Value Date/Time   CALCIUM 9.7 11/05/2020 0833   CALCIUM 10.1 07/30/2017 0802   ALKPHOS 85 11/05/2020 0833   ALKPHOS 64 07/30/2017 0802   AST 57 (H) 11/05/2020 0833   AST 59 (H) 10/09/2019 0801   AST 27 07/30/2017 0802   ALT 25 11/05/2020 0833   ALT 29 10/09/2019 0801   ALT 16 07/30/2017 0802   BILITOT 0.7 11/05/2020 0833   BILITOT 0.6 10/09/2019 0801   BILITOT 0.52 07/30/2017 0802       RADIOGRAPHIC STUDIES: I have reviewed imaging studies with the patient I have personally reviewed the radiological images as listed and agreed with the findings in the report. CT Chest Wo Contrast  Result Date: 11/13/2020 CLINICAL DATA:  Lung nodule follow-up. Intermittent RIGHT clavicle pain for 2 months in a patient with history of tonsillar cancer. EXAM: CT CHEST WITHOUT CONTRAST TECHNIQUE: Multidetector CT imaging of the chest was performed following the standard protocol without IV contrast. COMPARISON:  None. FINDINGS: Cardiovascular: Minimal atherosclerotic calcification in the thoracic aorta. Heart size normal. No pericardial effusion. Central pulmonary vasculature is normal caliber. Limited assessment of cardiovascular structures given lack of intravenous contrast. Mediastinum/Nodes: Esophagus grossly normal. No  axillary lymphadenopathy. No thoracic inlet lymphadenopathy. No mediastinal lymphadenopathy. No hilar lymphadenopathy. Lungs/Pleura: Biapical scarring. Nodular area amidst scarring in the RIGHT upper lobe measuring 1.8 x 1.0 cm as compared to 1.6 x 1.1 cm. Airways are patent. Note that when compared to studies dating back to 2019 this area previously measured approximately 1.6 x 0.6 cm in May of 2019. But when compared to December of 2019 the area shows little change. Upper Abdomen: Incidental imaging of upper abdominal contents shows no acute process. Musculoskeletal: Spinal degenerative changes. No acute or destructive bone finding. IMPRESSION: Nodular area amidst scarring in the RIGHT upper lobe shows little change but increased density in size since 2020 and perhaps slightly increased size compared to most recent imaging. Low-grade bronchogenic neoplasm in addition  to of all vein post radiation changes in the setting of prior head and neck cancer are considered. Mild pulmonary emphysema. Mild aortic atherosclerosis. Aortic Atherosclerosis (ICD10-I70.0) and Emphysema (ICD10-J43.9). Electronically Signed   By: Zetta Bills M.D.   On: 11/13/2020 22:37

## 2020-11-15 NOTE — Telephone Encounter (Signed)
Scheduled appointments per 3/14 sch msg. Spoke to patient who is aware of appointments dates and times. Gave patient calendar print out.

## 2020-11-15 NOTE — Assessment & Plan Note (Signed)
The lung nodule on the right apex is slowly enlarged but he is not symptomatic We discussed the risk and benefits of biopsy versus observation and he is in agreement to be observe with repeat CT imaging in 6 months If it continues to grow, I will refer him back to pulmonologist for possible biopsy

## 2020-11-15 NOTE — Assessment & Plan Note (Signed)
Clinically, he has no signs of cancer recurrence The CT imaging showed enlarging lung nodule but that happened over a span of 2 years, minimally changed He is not symptomatic He is noncompliant with follow-up with pulmonologist and noncompliant with recommendation to stop smoking and drinking I recommend repeat imaging study again in 6 months for follow-up

## 2020-11-15 NOTE — Assessment & Plan Note (Signed)
He has uncontrolled hypertension He claims he is taking his blood pressure medication as directed We discussed the importance of risk factor modification to prevent renal failure

## 2021-04-11 ENCOUNTER — Other Ambulatory Visit: Payer: Self-pay | Admitting: Hematology and Oncology

## 2021-04-11 DIAGNOSIS — C090 Malignant neoplasm of tonsillar fossa: Secondary | ICD-10-CM

## 2021-04-11 DIAGNOSIS — R918 Other nonspecific abnormal finding of lung field: Secondary | ICD-10-CM

## 2021-04-15 ENCOUNTER — Ambulatory Visit
Admission: RE | Admit: 2021-04-15 | Discharge: 2021-04-15 | Disposition: A | Discharge status: Home / Self Care | Payer: Medicaid Other | Source: Ambulatory Visit | Attending: Family Medicine | Admitting: Family Medicine

## 2021-04-15 ENCOUNTER — Other Ambulatory Visit: Payer: Self-pay

## 2021-04-15 ENCOUNTER — Other Ambulatory Visit: Payer: Self-pay | Admitting: Family Medicine

## 2021-04-15 DIAGNOSIS — M25511 Pain in right shoulder: Secondary | ICD-10-CM

## 2021-04-30 ENCOUNTER — Emergency Department (HOSPITAL_COMMUNITY)
Admission: EM | Admit: 2021-04-30 | Discharge: 2021-04-30 | Disposition: A | Discharge status: Home / Self Care | Payer: Medicaid Other | Attending: Emergency Medicine | Admitting: Emergency Medicine

## 2021-04-30 ENCOUNTER — Emergency Department (HOSPITAL_COMMUNITY): Payer: Medicaid Other

## 2021-04-30 ENCOUNTER — Encounter (HOSPITAL_COMMUNITY): Payer: Self-pay

## 2021-04-30 ENCOUNTER — Other Ambulatory Visit: Payer: Self-pay

## 2021-04-30 DIAGNOSIS — Z923 Personal history of irradiation: Secondary | ICD-10-CM | POA: Insufficient documentation

## 2021-04-30 DIAGNOSIS — F17210 Nicotine dependence, cigarettes, uncomplicated: Secondary | ICD-10-CM | POA: Insufficient documentation

## 2021-04-30 DIAGNOSIS — Z7982 Long term (current) use of aspirin: Secondary | ICD-10-CM | POA: Diagnosis not present

## 2021-04-30 DIAGNOSIS — Z23 Encounter for immunization: Secondary | ICD-10-CM | POA: Insufficient documentation

## 2021-04-30 DIAGNOSIS — W540XXA Bitten by dog, initial encounter: Secondary | ICD-10-CM | POA: Diagnosis not present

## 2021-04-30 DIAGNOSIS — Z85818 Personal history of malignant neoplasm of other sites of lip, oral cavity, and pharynx: Secondary | ICD-10-CM | POA: Insufficient documentation

## 2021-04-30 DIAGNOSIS — N183 Chronic kidney disease, stage 3 unspecified: Secondary | ICD-10-CM | POA: Insufficient documentation

## 2021-04-30 DIAGNOSIS — S91352A Open bite, left foot, initial encounter: Secondary | ICD-10-CM | POA: Diagnosis not present

## 2021-04-30 DIAGNOSIS — Z2914 Encounter for prophylactic rabies immune globin: Secondary | ICD-10-CM | POA: Insufficient documentation

## 2021-04-30 DIAGNOSIS — J441 Chronic obstructive pulmonary disease with (acute) exacerbation: Secondary | ICD-10-CM | POA: Diagnosis not present

## 2021-04-30 DIAGNOSIS — Z79899 Other long term (current) drug therapy: Secondary | ICD-10-CM | POA: Insufficient documentation

## 2021-04-30 DIAGNOSIS — Z203 Contact with and (suspected) exposure to rabies: Secondary | ICD-10-CM | POA: Diagnosis not present

## 2021-04-30 DIAGNOSIS — I129 Hypertensive chronic kidney disease with stage 1 through stage 4 chronic kidney disease, or unspecified chronic kidney disease: Secondary | ICD-10-CM | POA: Insufficient documentation

## 2021-04-30 MED ORDER — RABIES VACCINE, PCEC IM SUSR
1.0000 mL | Freq: Once | INTRAMUSCULAR | Status: AC
  Administered 2021-04-30: 1 mL via INTRAMUSCULAR
  Filled 2021-04-30: qty 1

## 2021-04-30 MED ORDER — AMOXICILLIN-POT CLAVULANATE 875-125 MG PO TABS
1.0000 | ORAL_TABLET | Freq: Once | ORAL | Status: AC
  Administered 2021-04-30: 1 via ORAL
  Filled 2021-04-30: qty 1

## 2021-04-30 MED ORDER — AMOXICILLIN-POT CLAVULANATE 875-125 MG PO TABS: 1.0000 | ORAL_TABLET | Freq: Two times a day (BID) | ORAL | 0 refills | Status: DC

## 2021-04-30 MED ORDER — TETANUS-DIPHTH-ACELL PERTUSSIS 5-2.5-18.5 LF-MCG/0.5 IM SUSY
0.5000 mL | PREFILLED_SYRINGE | Freq: Once | INTRAMUSCULAR | Status: AC
  Administered 2021-04-30: 0.5 mL via INTRAMUSCULAR
  Filled 2021-04-30: qty 0.5

## 2021-04-30 MED ORDER — RABIES IMMUNE GLOBULIN 150 UNIT/ML IM INJ
20.0000 [IU]/kg | INJECTION | Freq: Once | INTRAMUSCULAR | Status: AC
  Administered 2021-04-30: 1275 [IU] via INTRAMUSCULAR
  Filled 2021-04-30: qty 10

## 2021-04-30 NOTE — Discharge Instructions (Addendum)
You are seen in the ER today for your dog bite.  Your physical exam did reveal a puncture wound inside of your foot.  You administered a tetanus booster shot and have been prescribed antibiotics to take in the outpatient setting for the 5 days prevent developing infection from the dog bite.  You shared an extensive discussion with your provider regarding the role of the rabies vaccine in this case given the dog's vaccination status is unclear.  You expressed wishes to proceed with the rabies vaccine which you received today.  Please follow-up in the outpatient setting with your primary care doctor or the clinic listed below and return to the ER with any new redness, swelling, puslike drainage from the area, pain extending up your ankle anterior leg, or any other severe symptoms.

## 2021-04-30 NOTE — ED Notes (Signed)
Reviewed discharge instructions with patient. Follow-up care, medications and vaccine schedule/follow-up reviewed. Patient verbalized understanding. Patient A&Ox4, VSS, and ambulatory with steady gait upon discharge.

## 2021-04-30 NOTE — ED Triage Notes (Signed)
Patient here with dog bite to left foot by neighbors dog. Small puncture with no bleeding to foot

## 2021-04-30 NOTE — ED Provider Notes (Signed)
Emergency Medicine Provider Triage Evaluation Note  NATHIN BETKER , a 65 y.o. male  was evaluated in triage.  Pt complains of patient is 65 year old male complaining today of left foot pain uncertain of last tetanus update.  Was bit approximately 1 hour before arrival in the ER by a pit bull that lives in the neighborhood he was pleasant has not attacked anyone in the past and was not behaving abnormally..  Review of Systems  Positive: Bite wound to left foot Negative: Fever  Physical Exam  BP 112/88 (BP Location: Right Arm)   Pulse (!) 101   Temp 98.3 F (36.8 C) (Oral)   Resp 16   Wt 63.5 kg   SpO2 98%   BMI 18.93 kg/m  Gen:   Awake, no distress   Resp:  Normal effort  MSK:   Moves extremities without difficulty  Other:  Puncture wound to the lateral midfoot  Medical Decision Making  Medically screening exam initiated at 1:53 PM.  Appropriate orders placed.  [name] was informed that the remainder of the evaluation will be completed by another provider, this initial triage assessment does not replace that evaluation, and the importance of remaining in the ED until their evaluation is complete.     Pati Gallo [place], [place] 04/30/21 1355    Lacretia Leigh, MD 05/01/21 [phone]

## 2021-04-30 NOTE — ED Provider Notes (Signed)
[facility] EMERGENCY DEPARTMENT Provider Note   CSN: [id number] Arrival date & time: 04/30/21  1250     History No chief complaint on file.   [name] [name] is a 65 y.o. male who presents with dog bite to the left foot from the neighbors pitbull today.  States that he walked by the dog and ambulate quickly bit his foot and caused bleeding, but was not aggressive any further after that.  Stent presents because he is not up-to-date on his tetanus vaccine.  He is unsure of the dog's vaccination status with states that he knows the others well and is willing to talk with him about that.  I personally reads patient medical records.  His history of tonsillar cancer, hepatitis C, COPD, and gastric ulcers.  HPI     Past Medical History:  Diagnosis Date   Anxiety    Cancer ([place]) 04/04/2016   cancer of left tonsil   CKD (chronic kidney disease), stage III (HCC)    COPD (chronic obstructive pulmonary disease) (HCC)    Hepatitis    Hepatitis C    Treatment x 8 weeks 6-7 months ago [facility]   History of radiation therapy 05/23/16- 07/10/16   Left Tonsil and bilateral neck   Sinus congestion    Stomach ulcer     Patient Active Problem List   Diagnosis Date Noted   Essential hypertension 11/15/2020   COPD with acute exacerbation ([place]) 02/10/2019   Other fatigue 09/17/2017   Neck stiffness 08/03/2017   Multiple lung nodules on CT 12/05/2016   Alcohol abuse 11/07/2016   Multifocal pneumonia 10/04/2016   Chronic kidney disease, stage III (moderate) ([place]) 09/14/2016   Dry mouth 09/14/2016   Dysgeusia 09/05/2016   Bronchitis 09/01/2016   Anemia due to chronic illness 08/24/2016   Acute prerenal failure ([place]) 07/24/2016   Hemoptysis 07/24/2016   Pancytopenia, acquired ([place]) 07/04/2016   Atypical chest pain 06/27/2016   Bilateral tinnitus 05/30/2016   Hypersensitivity reaction 05/23/2016   Lesion of right lung 04/26/2016   Malignant neoplasm of tonsillar fossa  ([place]) 04/24/2016   History of hepatitis C 04/24/2016   Anxiety    Dyspnea 03/25/2014   COPD GOLD II 07/25/2013   Sinusitis, chronic 07/25/2013   Tobacco abuse 07/25/2013    Past Surgical History:  Procedure Laterality Date   COLONOSCOPY WITH PROPOFOL N/A 04/14/2015   Procedure: COLONOSCOPY WITH PROPOFOL;  Surgeon: Arta Silence, MD;  Location: [facility];  Service: Endoscopy;  Laterality: N/A;   HERNIA REPAIR     IR GENERIC HISTORICAL  05/17/2016   IR FLUORO GUIDE PORT INSERTION RIGHT 05/17/2016 Jacqulynn Cadet, MD [facility]   IR GENERIC HISTORICAL  05/17/2016   IR US GUIDE VASC ACCESS RIGHT 05/17/2016 Jacqulynn Cadet, MD [facility]   IR GENERIC HISTORICAL  05/17/2016   IR GASTROSTOMY TUBE MOD SED 05/17/2016 Jacqulynn Cadet, MD [facility]   IR GENERIC HISTORICAL  11/13/2016   IR GASTROSTOMY TUBE REMOVAL 11/13/2016 Greggory Keen, MD [facility]   IR GENERIC HISTORICAL  11/13/2016   IR REMOVAL TUN ACCESS W/ PORT W/O FL MOD SED 11/13/2016 Greggory Keen, MD [facility]   MULTIPLE EXTRACTIONS WITH ALVEOLOPLASTY N/A 05/03/2016   Procedure: Multiple extractions with alveoloplasty and gross debridement of remaining teeth.;  Surgeon: Lenn Cal, DDS;  Location: [facility];  Service: Oral Surgery;  Laterality: N/A;   STOMACH SURGERY     20 years       Family History  Problem Relation Age  of Onset   Hypertension Mother    Anxiety disorder Mother    Diabetes Mother    Cancer Father     Social History   Tobacco Use   Smoking status: Some Days    Packs/day: 0.50    Years: 30.00    Pack years: 15.00    Types: Cigarettes   Smokeless tobacco: Never   Tobacco comments:    says he hasn't smoked in 1 week. 09/12/2018  Substance Use Topics   Alcohol use: Yes    Comment: QOD   Drug use: No    Types: Cocaine    Home Medications Prior to Admission medications   Medication Sig Start Date End Date Taking? Authorizing Provider  amoxicillin-clavulanate (AUGMENTIN) 875-125 MG  tablet Take 1 tablet by mouth every 12 (twelve) hours. 04/30/21  Yes Alexander Mcauley R, PA-C  albuterol (VENTOLIN HFA) 108 (90 Base) MCG/ACT inhaler Inhale 2 puffs into the lungs every 4 (four) hours as needed for wheezing or shortness of breath. 02/10/19   Collene Gobble, MD  amLODipine (NORVASC) 5 MG tablet Take 5 mg by mouth daily. 07/13/20   [provider]  aspirin EC 81 MG tablet Take 81 mg by mouth daily.    [provider]  lisinopril (ZESTRIL) 5 MG tablet Take 5 mg by mouth daily. 10/27/20   [provider]    Allergies    Patient has no known allergies.  Review of Systems   Review of Systems  Constitutional: Negative.   HENT: Negative.    Respiratory: Negative.    Cardiovascular: Negative.   Gastrointestinal: Negative.   Musculoskeletal: Negative.   Skin:  Positive for wound.  Neurological: Negative.   Hematological: Negative.    Physical Exam Updated Vital Signs BP (!) 149/89 (BP Location: Right Arm)   Pulse 70   Temp 98.4 F (36.9 C) (Oral)   Resp 18   Wt 63.5 kg   SpO2 98%   BMI 18.93 kg/m   Physical Exam Vitals and nursing note reviewed.  Constitutional:      Appearance: He is not ill-appearing or toxic-appearing.  HENT:     Head: Normocephalic and atraumatic.     Nose: Nose normal.     Mouth/Throat:     Mouth: Mucous membranes are moist.     Pharynx: Oropharynx is clear. Uvula midline. No oropharyngeal exudate or posterior oropharyngeal erythema.     Tonsils: No tonsillar exudate.  Eyes:     General: Lids are normal. Vision grossly intact.        Right eye: No discharge.        Left eye: No discharge.     Extraocular Movements: Extraocular movements intact.     Conjunctiva/sclera: Conjunctivae normal.     Pupils: Pupils are equal, round, and reactive to light.  Neck:     Trachea: Trachea and phonation normal.  Cardiovascular:     Rate and Rhythm: Normal rate and regular rhythm.     Pulses: Normal pulses.     Heart  sounds: Normal heart sounds. No murmur heard. Pulmonary:     Effort: Pulmonary effort is normal. No tachypnea, bradypnea, accessory muscle usage or respiratory distress.     Breath sounds: Normal breath sounds. No wheezing or rales.  Chest:     Chest wall: No mass, lacerations, deformity, swelling, tenderness, crepitus or edema.  Abdominal:     General: Bowel sounds are normal. There is no distension.     Palpations: Abdomen is soft.  Tenderness: There is no abdominal tenderness. There is no right CVA tenderness, left CVA tenderness, guarding or rebound.  Musculoskeletal:        General: No deformity.     Cervical back: Normal range of motion and neck supple. No edema, rigidity or crepitus. No pain with movement.     Right lower leg: No edema.     Left lower leg: No edema.     Right foot: Normal.       Feet:     Comments: 2+ DP pulses  Lymphadenopathy:     Cervical: No cervical adenopathy.  Skin:    General: Skin is warm and dry.     Capillary Refill: Capillary refill takes less than 2 seconds.  Neurological:     General: No focal deficit present.     Mental Status: He is alert and oriented to person, place, and time. Mental status is at baseline.  Psychiatric:        Mood and Affect: Mood normal.    ED Results / Procedures / Treatments   Labs (all labs ordered are listed, but only abnormal results are displayed) Labs Reviewed - No data to display  EKG None  Radiology DG Foot Complete Left  Result Date: 04/30/2021 CLINICAL DATA:  Dog bite, lateral midfoot EXAM: LEFT FOOT - COMPLETE 3+ VIEW COMPARISON:  None. FINDINGS: Osseous structures are normally aligned. No fracture line or displaced fracture fragment. No acute-appearing cortical irregularity or osseous lesion. Soft tissue swelling/lucency overlying the fifth MTP joint, presumably related to the given history of dog bite. No radiodense foreign body is seen within the soft tissues. IMPRESSION: 1. Soft tissue  swelling/lucency overlying the fifth MTP joint, presumably related to the given history of dog bite. 2. No osseous fracture or dislocation. 3. No radiodense foreign body. Electronically Signed   By: Franki Cabot M.D.   On: 04/30/2021 14:40    Procedures Procedures   Medications Ordered in ED Medications  Tdap (BOOSTRIX) injection 0.5 mL (0.5 mLs Intramuscular Given 04/30/21 1723)  amoxicillin-clavulanate (AUGMENTIN) 875-125 MG per tablet 1 tablet (1 tablet Oral Given 04/30/21 1724)  rabies immune globulin (HYPERAB/KEDRAB) injection 1,275 Units (1,275 Units Intramuscular Given 04/30/21 1726)  rabies vaccine (RABAVERT) injection 1 mL (1 mL Intramuscular Given 04/30/21 1724)    ED Course  I have reviewed the triage vital signs and the nursing notes.  Pertinent labs & imaging results that were available during my care of the patient were reviewed by me and considered in my medical decision making (see chart for details).    MDM Rules/Calculators/A&P                         65 year old male presents with concern for dog bite sustained to the left foot today.  Not up-to-date on tetanus and unaware of dog's vaccination status.  Vital signs are normal intake with the exception of very mild tachycardia of 101.  No longer tachycardic at time of my exam.  Cardiopulmonary exam is normal, abdominal exam is benign.  Skin exam did reveal puncture wound with superficial skin tear to the lateral aspect of the left foot as above.  Boostrix updated, wound irrigated and antibiotic ointment applied.  Extensive discussion with the patient regarding role of rabies vaccine and immunoglobulin in the context of very mild dog bite.  However patient feels he is unsure he will be able to ascertain vaccination status of the dog and does not feel that will likely  be observed by animal control.  For this reason he expressed wishes to proceed with rabies vaccination and immunoglobulin today.  These were administered and  patient tolerated the procedures well.  There is no repair required for his wound.  He was discharged with prescription for Augmentin in the outpatient setting.  No further work-up is warranted in the ER at this time.  Egidio voiced understanding of his medical evaluation and treatment plan.  Each of his questions answered to his expressed satisfaction.  Strict return precautions are given.  This chart was dictated using voice recognition software, Dragon. Despite the best efforts of this provider to proofread and correct errors, errors may still occur which can change documentation meaning.  Final Clinical Impression(s) / ED Diagnoses Final diagnoses:  Dog bite, initial encounter    Rx / DC Orders ED Discharge Orders          Ordered    amoxicillin-clavulanate (AUGMENTIN) 875-125 MG tablet  Every 12 hours        04/30/21 1656             Juvia Aerts, Gypsy Balsam, PA-C 05/01/21 0018    Hayden Rasmussen, MD 05/01/21 1106

## 2021-05-03 ENCOUNTER — Other Ambulatory Visit: Payer: Self-pay

## 2021-05-03 ENCOUNTER — Ambulatory Visit (HOSPITAL_COMMUNITY)
Admission: EM | Admit: 2021-05-03 | Discharge: 2021-05-03 | Disposition: A | Discharge status: Home / Self Care | Payer: Medicaid Other | Attending: Internal Medicine | Admitting: Internal Medicine

## 2021-05-03 DIAGNOSIS — Z203 Contact with and (suspected) exposure to rabies: Secondary | ICD-10-CM | POA: Diagnosis not present

## 2021-05-03 MED ORDER — RABIES VACCINE, PCEC IM SUSR
1.0000 mL | Freq: Once | INTRAMUSCULAR | Status: AC
  Administered 2021-05-03: 1 mL via INTRAMUSCULAR

## 2021-05-03 MED ORDER — RABIES VACCINE, PCEC IM SUSR
INTRAMUSCULAR | Status: AC
  Filled 2021-05-03: qty 1

## 2021-05-03 NOTE — ED Triage Notes (Signed)
Day 3 of rabies vaccine

## 2021-05-10 ENCOUNTER — Telehealth (HOSPITAL_COMMUNITY): Payer: Self-pay

## 2021-05-16 ENCOUNTER — Ambulatory Visit: Payer: Medicaid Other | Admitting: Orthopaedic Surgery

## 2021-05-18 ENCOUNTER — Ambulatory Visit (HOSPITAL_COMMUNITY)
Admission: RE | Admit: 2021-05-18 | Discharge: 2021-05-18 | Disposition: A | Discharge status: Home / Self Care | Payer: Medicaid Other | Source: Ambulatory Visit | Attending: Hematology and Oncology | Admitting: Hematology and Oncology

## 2021-05-18 ENCOUNTER — Other Ambulatory Visit: Payer: Self-pay

## 2021-05-18 ENCOUNTER — Inpatient Hospital Stay: Payer: Medicaid Other | Attending: Hematology and Oncology

## 2021-05-18 DIAGNOSIS — N183 Chronic kidney disease, stage 3 unspecified: Secondary | ICD-10-CM | POA: Insufficient documentation

## 2021-05-18 DIAGNOSIS — R918 Other nonspecific abnormal finding of lung field: Secondary | ICD-10-CM | POA: Insufficient documentation

## 2021-05-18 DIAGNOSIS — C090 Malignant neoplasm of tonsillar fossa: Secondary | ICD-10-CM | POA: Insufficient documentation

## 2021-05-18 DIAGNOSIS — Z923 Personal history of irradiation: Secondary | ICD-10-CM | POA: Insufficient documentation

## 2021-05-18 DIAGNOSIS — R5383 Other fatigue: Secondary | ICD-10-CM

## 2021-05-18 DIAGNOSIS — Z79899 Other long term (current) drug therapy: Secondary | ICD-10-CM | POA: Insufficient documentation

## 2021-05-18 DIAGNOSIS — Z7982 Long term (current) use of aspirin: Secondary | ICD-10-CM | POA: Insufficient documentation

## 2021-05-18 DIAGNOSIS — Z85818 Personal history of malignant neoplasm of other sites of lip, oral cavity, and pharynx: Secondary | ICD-10-CM | POA: Insufficient documentation

## 2021-05-18 LAB — CBC WITH DIFFERENTIAL/PLATELET
Abs Immature Granulocytes: 0.02 10*3/uL (ref 0.00–0.07)
Basophils Absolute: 0 10*3/uL (ref 0.0–0.1)
Basophils Relative: 1 %
Eosinophils Absolute: 0.3 10*3/uL (ref 0.0–0.5)
Eosinophils Relative: 6 %
HCT: 40.7 % (ref 39.0–52.0)
Hemoglobin: 13.9 g/dL (ref 13.0–17.0)
Immature Granulocytes: 0 %
Lymphocytes Relative: 17 %
Lymphs Abs: 0.8 10*3/uL (ref 0.7–4.0)
MCH: 32.1 pg (ref 26.0–34.0)
MCHC: 34.2 g/dL (ref 30.0–36.0)
MCV: 94 fL (ref 80.0–100.0)
Monocytes Absolute: 0.7 10*3/uL (ref 0.1–1.0)
Monocytes Relative: 14 %
Neutro Abs: 3.1 10*3/uL (ref 1.7–7.7)
Neutrophils Relative %: 62 %
Platelets: 192 10*3/uL (ref 150–400)
RBC: 4.33 MIL/uL (ref 4.22–5.81)
RDW: 13.2 % (ref 11.5–15.5)
WBC: 5 10*3/uL (ref 4.0–10.5)
nRBC: 0 % (ref 0.0–0.2)

## 2021-05-18 LAB — COMPREHENSIVE METABOLIC PANEL
ALT: 21 U/L (ref 0–44)
AST: 38 U/L (ref 15–41)
Albumin: 4 g/dL (ref 3.5–5.0)
Alkaline Phosphatase: 66 U/L (ref 38–126)
Anion gap: 10 (ref 5–15)
BUN: 22 mg/dL (ref 8–23)
CO2: 25 mmol/L (ref 22–32)
Calcium: 9.8 mg/dL (ref 8.9–10.3)
Chloride: 101 mmol/L (ref 98–111)
Creatinine, Ser: 1.58 mg/dL — ABNORMAL HIGH (ref 0.61–1.24)
GFR, Estimated: 49 mL/min — ABNORMAL LOW (ref >60)
Glucose, Bld: 86 mg/dL (ref 70–99)
Potassium: 4.5 mmol/L (ref 3.5–5.1)
Sodium: 136 mmol/L (ref 135–145)
Total Bilirubin: 0.4 mg/dL (ref 0.3–1.2)
Total Protein: 7.2 g/dL (ref 6.5–8.1)

## 2021-05-18 LAB — TSH: TSH: 3.065 u[IU]/mL (ref 0.320–4.118)

## 2021-05-18 MED ORDER — IOHEXOL 350 MG/ML SOLN
80.0000 mL | Freq: Once | INTRAVENOUS | Status: AC | PRN
  Administered 2021-05-18: 75 mL via INTRAVENOUS

## 2021-05-19 ENCOUNTER — Inpatient Hospital Stay (HOSPITAL_BASED_OUTPATIENT_CLINIC_OR_DEPARTMENT_OTHER): Payer: Medicaid Other | Admitting: Hematology and Oncology

## 2021-05-19 ENCOUNTER — Encounter: Payer: Self-pay | Admitting: Hematology and Oncology

## 2021-05-19 DIAGNOSIS — R918 Other nonspecific abnormal finding of lung field: Secondary | ICD-10-CM

## 2021-05-19 DIAGNOSIS — C090 Malignant neoplasm of tonsillar fossa: Secondary | ICD-10-CM | POA: Diagnosis not present

## 2021-05-19 DIAGNOSIS — Z7982 Long term (current) use of aspirin: Secondary | ICD-10-CM | POA: Diagnosis not present

## 2021-05-19 DIAGNOSIS — K029 Dental caries, unspecified: Secondary | ICD-10-CM

## 2021-05-19 DIAGNOSIS — Z85818 Personal history of malignant neoplasm of other sites of lip, oral cavity, and pharynx: Secondary | ICD-10-CM | POA: Diagnosis present

## 2021-05-19 DIAGNOSIS — N183 Chronic kidney disease, stage 3 unspecified: Secondary | ICD-10-CM | POA: Diagnosis not present

## 2021-05-19 DIAGNOSIS — Z79899 Other long term (current) drug therapy: Secondary | ICD-10-CM | POA: Diagnosis not present

## 2021-05-19 DIAGNOSIS — Z923 Personal history of irradiation: Secondary | ICD-10-CM | POA: Diagnosis not present

## 2021-05-19 NOTE — Assessment & Plan Note (Signed)
The changes in the lungs are stable, related to fibrotic changes I advised the patient not to smoke long-term

## 2021-05-19 NOTE — Progress Notes (Signed)
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[place]) I have reviewed blood work and imaging studies with the patient The fibrotic changes in the lungs are about the same, long-term side effects from radiation Overall, the patient is now considered a long-term survivor, 5 years out from treatment I will discontinue follow-up here He will continue follow-up with primary care doctor for further management  Chronic kidney disease, stage III (moderate) Likely due to prior treatment His serum creatinine is stable  Multiple lung nodules on CT The changes in the lungs are stable, related to fibrotic changes I advised the patient not to smoke long-term  Dental caries He has poor dentition since radiation therapy He will see oral surgeon today for further evaluation and management  No orders of the defined types were placed in this encounter.   All questions were answered. The patient knows to call the clinic with any problems, questions or concerns. The total time spent in the appointment was 25 minutes encounter with patients including review of chart and various tests results, discussions about plan of care and coordination of care plan   Heath Lark, MD 05/19/2021 10:05 AM  INTERVAL HISTORY: Please see below for problem oriented charting. he returns for follow-up on history of tonsil cancer He has appointment to see dental surgeon today for dental pain He had problems with his teeth since  radiation therapy Denies recent cough, chest pain or shortness of breath He returns today to review all test results  REVIEW OF SYSTEMS:   Constitutional: Denies fevers, chills or abnormal weight loss Eyes: Denies blurriness of vision Ears, nose, mouth, throat, and face: Denies mucositis or sore throat Respiratory: Denies cough, dyspnea or wheezes Cardiovascular: Denies palpitation, chest discomfort or lower extremity swelling Gastrointestinal:  Denies nausea, heartburn or change in bowel habits Skin: Denies abnormal skin rashes Lymphatics: Denies new lymphadenopathy or easy bruising Neurological:Denies numbness, tingling or new weaknesses Behavioral/Psych: Mood is stable, no new changes  All other systems were reviewed with the patient and are negative.  I have reviewed the past medical history, past surgical history, social history and family history with the patient and they are unchanged from previous note.  ALLERGIES:  has No Known Allergies.  MEDICATIONS:  Current Outpatient Medications  Medication Sig Dispense Refill   albuterol (VENTOLIN HFA) 108 (90 Base) MCG/ACT inhaler Inhale 2 puffs into the lungs every 4 (four) hours as needed for wheezing or shortness of breath. 1 Inhaler 1   amLODipine (NORVASC) 5 MG tablet Take 5 mg by mouth daily.     amoxicillin-clavulanate (AUGMENTIN) 875-125 MG tablet Take 1 tablet by mouth every 12 (twelve) hours. 14 tablet 0   aspirin EC 81 MG tablet Take 81 mg by mouth daily.     lisinopril (ZESTRIL) 5 MG tablet Take 5 mg by mouth daily.     No current facility-administered medications for this visit.    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 Chronic small-vessel ischemic changes of the cerebral hemispheric white matter.     04/04/2016 Imaging   CT neck showed left tonsillar mass with diameter of 19 x 24 mm consistent with 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 necrosis. Suspicious level 5/supraclavicular nodes on the left. Scar type density in the right upper lobe is more prominent than was seen on a CT scan of the chest 10/06/2014   04/21/2016 PET scan   Asymmetric hypermetabolic soft tissue prominence in left palatine tonsil, consistent with known primary left tonsillar squamous cell Carcinoma. Left level 2 cervical lymphadenopathy, consistent with metastatic disease. No evidence of metastatic disease within the chest, abdomen, or pelvis. Stable 14 mm ground-glass and part solid nodular opacity in right lung apex compared to previous CT in 2016. This shows low-grade metabolic activity. Low-grade bronchogenic adenocarcinoma cannot be excluded. Consider surgical resection versus continued followup by CT in 12 months   05/17/2016 Procedure   Successful placement of a 20 French pull through gastrostomy tube & right IJ approach [facility] [place].   05/22/2016 - 07/03/2016 Chemotherapy   He received 3 doses of high dose cisplatin with radiation    05/23/2016 - 07/10/2016 Radiation Therapy   Rec'd Helical IMRT:  Left tonsil and bilateral neck / 70 Gy in 35 fractions to gross disease, 63 Gy in 35 fractions to high risk nodal echelons, and 56 Gy in 35 fractions to intermediate risk nodal echelons.        10/04/2016 - 10/07/2016 Hospital Admission   He was admitted due to atypical chest pain from illicit drug use. CT scan showed possible atypical pneumonia versus radiation pneumonitis   10/04/2016 Imaging   Ct scan showed no evidence of pulmonary embolus. 2. Patchy bilateral airspace opacities, at the upper lobes, compatible with multifocal pneumonia. This corresponds to the finding on recent chest  radiograph. 3. Previously noted ground-glass and solid nodule at the right lung apex demonstrates increased solid components. The solid component now measures 1.2 cm. 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 Tissue diagnosis would be helpful, as deemed clinically appropriate.   10/26/2016 PET scan   Slight increasing solid component of a right apical lung lesion but no concerning hypermetabolism. Recommend continued surveillance. 2. Bilateral patchy apical airspace opacities are mild to moderately hypermetabolic and likely reflect infection. Recommend short-term followup noncontrast chest CT in 3 months to reassess. 3. No mediastinal or hilar mass or adenopathy. 4. No significant findings in the abdomen/pelvis. 5. Stable lucencies throughout the bony pelvis but no hypermetabolism to suggest metastasis.   11/13/2016 Procedure   Successful right IJ vein Port-A-Cath explant.   11/13/2016 Procedure   Uncomplicated gastrostomy removal   01/23/2017 Imaging   Ct neck: 1. Sequelae of radiation therapy in the neck. The oropharynx level of this neck CT is degraded by motion artifact. Still, soft tissue volume has definitely decreased at the level of the 2017 left tonsillar mass, suggesting positive response to treatment and concordant with the negative findings on the February PET-CT. 2. Normalized left level 2 metastatic lymph node. No abnormal cervical lymph nodes today. 3. Chest CT findings today are reported separately.   01/23/2017 Imaging   Ct chest: 1. Stable biapical pleural and parenchymal scarring changes with patchy airspace opacities and interstitial thickening most likely representing postinflammatory/postinfectious scarring  changes. No new pulmonary lesions to suggest metastatic disease. Recommend six-month follow-up noncontrast chest CT to reassess. 2. Stable emphysematous changes. 3. No mediastinal or hilar mass or adenopathy.   08/02/2017  Imaging   1. Irregular subsolid apical right upper lobe 1.8 cm pulmonary nodule is stable in size with slightly increased solid component in the interval. This nodule has increased in density and mildly increased in size since 10/06/2014 chest CT, which pre-dates the history of radiation therapy for the left tonsillar malignancy, which was diagnosed in August 2017. As such, an indolent/low grade primary bronchogenic adenocarcinoma remains on the differential. Continued surveillance is advised if surgical management or biopsy is not elected at this time. 2. Separate biapical lung opacities demonstrate changes suggestive of evolving postinflammatory change such as due to prior radiation therapy. 3. No thoracic adenopathy or other findings suggestive of metastatic disease in the chest.   09/17/2017 PET scan   1. No findings to suggest recurrent or metastatic head/neck primary. 2. Right apical mixed attenuation nodule is similar in size to most recent CT and similar in hypermetabolism compared to 10/26/2016. As on the recent CT, slow interval growth suggests indolent neoplasm such as low-grade adenocarcinoma. 3.  Aortic Atherosclerosis (ICD10-I70.0). 4. Stable appearance of non FDG avid lucencies throughout the bony pelvis. These warrant ongoing follow-up attention. 5. Trace cul-de-sac fluid, new or increased since the prior PET.   08/30/2018 Imaging   1. Stable biapical pleural and parenchymal scarring changes with stable nodular density at the right lung apex. No new or progressive or worrisome findings. Recommend follow-up noncontrast chest CT in 12 months. 2. Stable emphysematous changes. No acute pulmonary findings or new pulmonary nodules. 3. No mediastinal or hilar mass or adenopathy.     03/03/2019 Imaging   1. Asymmetric nodular lesion in the apical segment of the right upper lobe is stable from 01/23/2018 but appears slightly more prominent than on 09/17/2017. Additional follow-up in 1 year is  recommended, as clinically indicated. 2.  Aortic atherosclerosis (ICD10-170.0). 3.  Emphysema (ICD10-J43.9).     11/12/2020 Imaging   Nodular area amidst scarring in the RIGHT upper lobe shows little change but increased density in size since 2020 and perhaps slightly increased size compared to most recent imaging. Low-grade bronchogenic neoplasm in addition to of all vein post radiation changes in the setting of prior head and neck cancer are considered.   Mild pulmonary emphysema.   Mild aortic atherosclerosis.   Aortic Atherosclerosis (ICD10-I70.0) and Emphysema (ICD10-J43.9).   05/18/2021 Imaging   CT chest Nodular parenchymal changes amidst bandlike changes in the upper lobes RIGHT greater than LEFT, compatible with post treatment changes, area of concern in the RIGHT upper lobe is stable and increase in this area follows the pattern of post treatment changes elsewhere in the upper lobes. No new or progressive findings.   Aortic Atherosclerosis (ICD10-I70.0).   05/18/2021 Imaging   Ct neck  No evidence of recurrent disease in the neck.     PHYSICAL EXAMINATION: ECOG PERFORMANCE STATUS: 0 - Asymptomatic  Vitals:   05/19/21 0908  BP: (!) 129/93  Pulse: 81  Resp: 20  Temp: (!) 97.1 F (36.2 C)  SpO2: 100%   Filed Weights   05/19/21 0908  Weight: 138 lb 3.2 oz (62.7 kg)    GENERAL:alert, no distress and comfortable NEURO: alert & oriented x 3 with fluent speech, no focal motor/sensory deficits  LABORATORY DATA:  I have reviewed the data as listed    Component Value Date/Time  NA 136 05/18/2021 0736   NA 139 07/30/2017 0802   K 4.5 05/18/2021 0736   K 4.9 07/30/2017 0802   CL 101 05/18/2021 0736   CO2 25 05/18/2021 0736   CO2 27 07/30/2017 0802   GLUCOSE 86 05/18/2021 0736   GLUCOSE 97 07/30/2017 0802   BUN 22 05/18/2021 0736   BUN 21.0 07/30/2017 0802   CREATININE 1.58 (H) 05/18/2021 0736   CREATININE 1.86 (H) 10/09/2019 0801   CREATININE 1.6 (H)  07/30/2017 0802   CALCIUM 9.8 05/18/2021 0736   CALCIUM 10.1 07/30/2017 0802   PROT 7.2 05/18/2021 0736   PROT 8.0 07/30/2017 0802   ALBUMIN 4.0 05/18/2021 0736   ALBUMIN 4.3 07/30/2017 0802   AST 38 05/18/2021 0736   AST 59 (H) 10/09/2019 0801   AST 27 07/30/2017 0802   ALT 21 05/18/2021 0736   ALT 29 10/09/2019 0801   ALT 16 07/30/2017 0802   ALKPHOS 66 05/18/2021 0736   ALKPHOS 64 07/30/2017 0802   BILITOT 0.4 05/18/2021 0736   BILITOT 0.6 10/09/2019 0801   BILITOT 0.52 07/30/2017 0802   GFRNONAA 49 (L) 05/18/2021 0736   GFRNONAA 38 (L) 10/09/2019 0801   GFRAA 50 (L) 04/19/2020 1506   GFRAA 44 (L) 10/09/2019 0801    No results found for: SPEP, UPEP  Lab Results  Component Value Date   WBC 5.0 05/18/2021   NEUTROABS 3.1 05/18/2021   HGB 13.9 05/18/2021   HCT 40.7 05/18/2021   MCV 94.0 05/18/2021   PLT 192 05/18/2021      Chemistry      Component Value Date/Time   NA 136 05/18/2021 0736   NA 139 07/30/2017 0802   K 4.5 05/18/2021 0736   K 4.9 07/30/2017 0802   CL 101 05/18/2021 0736   CO2 25 05/18/2021 0736   CO2 27 07/30/2017 0802   BUN 22 05/18/2021 0736   BUN 21.0 07/30/2017 0802   CREATININE 1.58 (H) 05/18/2021 0736   CREATININE 1.86 (H) 10/09/2019 0801   CREATININE 1.6 (H) 07/30/2017 0802      Component Value Date/Time   CALCIUM 9.8 05/18/2021 0736   CALCIUM 10.1 07/30/2017 0802   ALKPHOS 66 05/18/2021 0736   ALKPHOS 64 07/30/2017 0802   AST 38 05/18/2021 0736   AST 59 (H) 10/09/2019 0801   AST 27 07/30/2017 0802   ALT 21 05/18/2021 0736   ALT 29 10/09/2019 0801   ALT 16 07/30/2017 0802   BILITOT 0.4 05/18/2021 0736   BILITOT 0.6 10/09/2019 0801   BILITOT 0.52 07/30/2017 0802       RADIOGRAPHIC STUDIES: I have reviewed multiple imaging studies with the patient I have personally reviewed the radiological images as listed and agreed with the findings in the report. CT Soft Tissue Neck W Contrast  Result Date: 05/18/2021 CLINICAL DATA:   Malignant neoplasm of tonsillar fossa. EXAM: CT NECK WITH CONTRAST TECHNIQUE: Multidetector CT imaging of the neck was performed using the standard protocol following the bolus administration of intravenous contrast. CONTRAST:  40m OMNIPAQUE IOHEXOL 350 MG/ML SOLN COMPARISON:  PET/CT 09/17/2017.  Neck CT 01/23/2017 FINDINGS: Pharynx and larynx: Post treatment changes without evidence of a recurrent mass. Widely patent airway. Salivary glands: Post radiation changes.  No mass or salivary stone. Thyroid: Unremarkable. Lymph nodes: No enlarged or suspicious lymph nodes in the neck. Vascular: Major vascular structures of the neck are grossly patent. Limited intracranial: Unremarkable. Visualized orbits: Unremarkable. Mastoids and visualized paranasal sinuses: Mild left posterior ethmoid air cell  mucosal thickening. Clear mastoid air cells. Skeleton: No suspicious osseous lesion. Moderately advanced cervical disc degeneration. Upper chest: Reported separately. Other: None. IMPRESSION: No evidence of recurrent disease in the neck. Electronically Signed   By: Logan Bores M.D.   On: 05/18/2021 14:40   CT CHEST W CONTRAST  Result Date: 05/18/2021 CLINICAL DATA:  Follow-up lung nodule in a 65 year old male, findings in the RIGHT upper lobe following treatment for tonsillar cancer. EXAM: CT CHEST WITH CONTRAST TECHNIQUE: Multidetector CT imaging of the chest was performed during intravenous contrast administration. CONTRAST:  46m OMNIPAQUE IOHEXOL 350 MG/ML SOLN COMPARISON:  November 12, 2020 and March 03, 2019. FINDINGS: Cardiovascular: Stable appearance of the heart and great vessels. No aneurysmal dilation of the thoracic aorta. Normal heart size. Normal caliber of central pulmonary vessels. Mediastinum/Nodes: Scattered small lymph nodes throughout the chest, see neck CT for details regarding findings in the neck reported separately. Stable small supraclavicular lymph node (image 13/3) 7 mm. No internal mammary  adenopathy. No axillary lymphadenopathy. Esophagus grossly normal. No hilar lymphadenopathy. No mediastinal adenopathy. Lungs/Pleura: Nodular parenchymal changes amidst bandlike changes in the upper lobes RIGHT greater than LEFT. 1.6 x 1.2 cm area of nodularity in the RIGHT upper lobe has coalesced over time as have other suspected post treatment changes in this location and in the LEFT upper lobe. This is stable compared to the most recent imaging study and showing generalized increased conspicuity in the upper lobe since imaging from June of 2020 and more remote studies. Airways are patent. No effusion or consolidation. Upper Abdomen: Imaged portions the liver, pancreas, spleen, adrenal glands and kidneys are unremarkable. No acute gastrointestinal process on very limited assessment. No upper abdominal lymphadenopathy. Musculoskeletal: No acute musculoskeletal process. Spinal degenerative changes. IMPRESSION: Nodular parenchymal changes amidst bandlike changes in the upper lobes RIGHT greater than LEFT, compatible with post treatment changes, area of concern in the RIGHT upper lobe is stable and increase in this area follows the pattern of post treatment changes elsewhere in the upper lobes. No new or progressive findings. Aortic Atherosclerosis (ICD10-I70.0). Electronically Signed   By: GZetta BillsM.D.   On: 05/18/2021 16:04   DG Foot Complete Left  Result Date: 04/30/2021 CLINICAL DATA:  Dog bite, lateral midfoot EXAM: LEFT FOOT - COMPLETE 3+ VIEW COMPARISON:  None. FINDINGS: Osseous structures are normally aligned. No fracture line or displaced fracture fragment. No acute-appearing cortical irregularity or osseous lesion. Soft tissue swelling/lucency overlying the fifth MTP joint, presumably related to the given history of dog bite. No radiodense foreign body is seen within the soft tissues. IMPRESSION: 1. Soft tissue swelling/lucency overlying the fifth MTP joint, presumably related to the given  history of dog bite. 2. No osseous fracture or dislocation. 3. No radiodense foreign body. Electronically Signed   By: SFranki CabotM.D.   On: 04/30/2021 14:40

## 2021-05-19 NOTE — Assessment & Plan Note (Signed)
Likely due to prior treatment His serum creatinine is stable

## 2021-05-19 NOTE — Assessment & Plan Note (Signed)
He has poor dentition since radiation therapy He will see oral surgeon today for further evaluation and management

## 2021-05-19 NOTE — Assessment & Plan Note (Signed)
I have reviewed blood work and imaging studies with the patient The fibrotic changes in the lungs are about the same, long-term side effects from radiation Overall, the patient is now considered a long-term survivor, 5 years out from treatment I will discontinue follow-up here He will continue follow-up with primary care doctor for further management

## 2021-06-03 ENCOUNTER — Other Ambulatory Visit (HOSPITAL_COMMUNITY): Payer: Medicaid Other | Admitting: Dentistry

## 2021-06-06 ENCOUNTER — Ambulatory Visit: Payer: Medicaid Other | Admitting: Orthopaedic Surgery

## 2021-06-20 ENCOUNTER — Telehealth: Payer: Self-pay | Admitting: Orthopaedic Surgery

## 2021-06-20 NOTE — Telephone Encounter (Signed)
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 Pt would like to r/s but the note on the referral says not to. He would like a CB to explain what he needs to do to be seen.   [phone]

## 2021-07-26 ENCOUNTER — Emergency Department (HOSPITAL_COMMUNITY): Payer: Medicaid Other

## 2021-07-26 ENCOUNTER — Emergency Department (HOSPITAL_COMMUNITY)
Admission: EM | Admit: 2021-07-26 | Discharge: 2021-07-26 | Disposition: A | Discharge status: Home / Self Care | Payer: Medicaid Other | Attending: Emergency Medicine | Admitting: Emergency Medicine

## 2021-07-26 DIAGNOSIS — N183 Chronic kidney disease, stage 3 unspecified: Secondary | ICD-10-CM | POA: Diagnosis not present

## 2021-07-26 DIAGNOSIS — I2510 Atherosclerotic heart disease of native coronary artery without angina pectoris: Secondary | ICD-10-CM | POA: Diagnosis not present

## 2021-07-26 DIAGNOSIS — R109 Unspecified abdominal pain: Secondary | ICD-10-CM | POA: Diagnosis not present

## 2021-07-26 DIAGNOSIS — S99922A Unspecified injury of left foot, initial encounter: Secondary | ICD-10-CM | POA: Diagnosis present

## 2021-07-26 DIAGNOSIS — J449 Chronic obstructive pulmonary disease, unspecified: Secondary | ICD-10-CM | POA: Insufficient documentation

## 2021-07-26 DIAGNOSIS — F17210 Nicotine dependence, cigarettes, uncomplicated: Secondary | ICD-10-CM | POA: Insufficient documentation

## 2021-07-26 DIAGNOSIS — Z8529 Personal history of malignant neoplasm of other respiratory and intrathoracic organs: Secondary | ICD-10-CM | POA: Insufficient documentation

## 2021-07-26 DIAGNOSIS — Z79899 Other long term (current) drug therapy: Secondary | ICD-10-CM | POA: Diagnosis not present

## 2021-07-26 DIAGNOSIS — I129 Hypertensive chronic kidney disease with stage 1 through stage 4 chronic kidney disease, or unspecified chronic kidney disease: Secondary | ICD-10-CM | POA: Insufficient documentation

## 2021-07-26 DIAGNOSIS — R350 Frequency of micturition: Secondary | ICD-10-CM | POA: Insufficient documentation

## 2021-07-26 DIAGNOSIS — Z7982 Long term (current) use of aspirin: Secondary | ICD-10-CM | POA: Diagnosis not present

## 2021-07-26 DIAGNOSIS — S92505A Nondisplaced unspecified fracture of left lesser toe(s), initial encounter for closed fracture: Secondary | ICD-10-CM | POA: Insufficient documentation

## 2021-07-26 DIAGNOSIS — X58XXXA Exposure to other specified factors, initial encounter: Secondary | ICD-10-CM | POA: Diagnosis not present

## 2021-07-26 LAB — URINALYSIS, ROUTINE W REFLEX MICROSCOPIC
Bilirubin Urine: NEGATIVE
Glucose, UA: NEGATIVE mg/dL
Hgb urine dipstick: NEGATIVE
Ketones, ur: NEGATIVE mg/dL
Leukocytes,Ua: NEGATIVE
Nitrite: NEGATIVE
Protein, ur: NEGATIVE mg/dL
Specific Gravity, Urine: 1.01 (ref 1.005–1.030)
pH: 7 (ref 5.0–8.0)

## 2021-07-26 LAB — CBC WITH DIFFERENTIAL/PLATELET
Abs Immature Granulocytes: 0.02 10*3/uL (ref 0.00–0.07)
Basophils Absolute: 0 10*3/uL (ref 0.0–0.1)
Basophils Relative: 1 %
Eosinophils Absolute: 0.1 10*3/uL (ref 0.0–0.5)
Eosinophils Relative: 2 %
HCT: 44.3 % (ref 39.0–52.0)
Hemoglobin: 15.2 g/dL (ref 13.0–17.0)
Immature Granulocytes: 0 %
Lymphocytes Relative: 17 %
Lymphs Abs: 1.2 10*3/uL (ref 0.7–4.0)
MCH: 32.9 pg (ref 26.0–34.0)
MCHC: 34.3 g/dL (ref 30.0–36.0)
MCV: 95.9 fL (ref 80.0–100.0)
Monocytes Absolute: 0.8 10*3/uL (ref 0.1–1.0)
Monocytes Relative: 11 %
Neutro Abs: 4.9 10*3/uL (ref 1.7–7.7)
Neutrophils Relative %: 69 %
Platelets: 247 10*3/uL (ref 150–400)
RBC: 4.62 MIL/uL (ref 4.22–5.81)
RDW: 14.1 % (ref 11.5–15.5)
WBC: 7.1 10*3/uL (ref 4.0–10.5)
nRBC: 0 % (ref 0.0–0.2)

## 2021-07-26 LAB — BASIC METABOLIC PANEL
Anion gap: 10 (ref 5–15)
BUN: 21 mg/dL (ref 8–23)
CO2: 26 mmol/L (ref 22–32)
Calcium: 9.4 mg/dL (ref 8.9–10.3)
Chloride: 99 mmol/L (ref 98–111)
Creatinine, Ser: 1.39 mg/dL — ABNORMAL HIGH (ref 0.61–1.24)
GFR, Estimated: 57 mL/min — ABNORMAL LOW (ref >60)
Glucose, Bld: 95 mg/dL (ref 70–99)
Potassium: 4.2 mmol/L (ref 3.5–5.1)
Sodium: 135 mmol/L (ref 135–145)

## 2021-07-26 NOTE — ED Triage Notes (Signed)
Pt. Stated, [name] [name] had a injury to my left little toe and also Im having problems with my kidneys im peeing all the time.

## 2021-07-26 NOTE — ED Notes (Signed)
RN called ride for pt.

## 2021-07-26 NOTE — ED Provider Notes (Signed)
[facility] EMERGENCY DEPARTMENT Provider Note   CSN: [id number] Arrival date & time: 07/26/21  9381     History Chief Complaint  Patient presents with   Dysuria   Toe Injury    [name] [name] is a 65 y.o. male.  HPI   65 y/o male with a h/o anxiety, CA, COPD, hep C, who presents to the ED today for eval of multiple complaints.  Toe pain: Pt c/o left 5th toe pain that started a few days ago after he jammed it while moving. He was seen by his pcp yesterday and they placed a splint. He is c/o persistent pain and wanted a second opinion.   Urinary problems: Reports urinary frequency, abd pain for the last 7-10 days. Denies dysuria, urgency, hematuria. Denies penile discharge or concern for STD. Has not been sexually active. Denies diarrhea, constipation, last BM yesterday.   Past Medical History:  Diagnosis Date   Anxiety    Cancer ([place]) 04/04/2016   cancer of left tonsil   CKD (chronic kidney disease), stage III (HCC)    COPD (chronic obstructive pulmonary disease) (HCC)    Hepatitis    Hepatitis C    Treatment x 8 weeks 6-7 months ago Harvoni   History of radiation therapy 05/23/16- 07/10/16   Left Tonsil and bilateral neck   Sinus congestion    Stomach ulcer     Patient Active Problem List   Diagnosis Date Noted   Essential hypertension 11/15/2020   COPD with acute exacerbation ([place]) 02/10/2019   Other fatigue 09/17/2017   Neck stiffness 08/03/2017   Multiple lung nodules on CT 12/05/2016   Alcohol abuse 11/07/2016   Multifocal pneumonia 10/04/2016   Chronic kidney disease, stage III (moderate) ([place]) 09/14/2016   Dry mouth 09/14/2016   Dysgeusia 09/05/2016   Bronchitis 09/01/2016   Anemia due to chronic illness 08/24/2016   Acute prerenal failure ([place]) 07/24/2016   Hemoptysis 07/24/2016   Pancytopenia, acquired ([place]) 07/04/2016   Atypical chest pain 06/27/2016   Bilateral tinnitus 05/30/2016   Hypersensitivity reaction 05/23/2016   Lesion  of right lung 04/26/2016   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   History of hepatitis C 04/24/2016   Anxiety    Dyspnea 03/25/2014   COPD GOLD II 07/25/2013   Sinusitis, chronic 07/25/2013   Tobacco abuse 07/25/2013    Past Surgical History:  Procedure Laterality Date   COLONOSCOPY WITH PROPOFOL N/A 04/14/2015   Procedure: COLONOSCOPY WITH PROPOFOL;  Surgeon: Arta Silence, MD;  Location: [facility];  Service: Endoscopy;  Laterality: N/A;   HERNIA REPAIR     IR GENERIC HISTORICAL  05/17/2016   IR FLUORO GUIDE PORT INSERTION RIGHT 05/17/2016 Jacqulynn Cadet, MD [facility]   IR GENERIC HISTORICAL  05/17/2016   IR US GUIDE VASC ACCESS RIGHT 05/17/2016 Jacqulynn Cadet, MD [facility]   IR GENERIC HISTORICAL  05/17/2016   IR GASTROSTOMY TUBE MOD SED 05/17/2016 Jacqulynn Cadet, MD [facility]   IR GENERIC HISTORICAL  11/13/2016   IR GASTROSTOMY TUBE REMOVAL 11/13/2016 Greggory Keen, MD [facility]   IR GENERIC HISTORICAL  11/13/2016   IR REMOVAL TUN ACCESS W/ PORT W/O FL MOD SED 11/13/2016 Greggory Keen, MD [facility]   MULTIPLE EXTRACTIONS WITH ALVEOLOPLASTY N/A 05/03/2016   Procedure: Multiple extractions with alveoloplasty and gross debridement of remaining teeth.;  Surgeon: Lenn Cal, DDS;  Location: [facility];  Service: Oral Surgery;  Laterality: N/A;   STOMACH SURGERY  37 years       Family History  Problem Relation Age of Onset   Hypertension Mother    Anxiety disorder Mother    Diabetes Mother    Cancer Father     Social History   Tobacco Use   Smoking status: Some Days    Packs/day: 0.50    Years: 30.00    Pack years: 15.00    Types: Cigarettes   Smokeless tobacco: Never   Tobacco comments:    says he hasn't smoked in 1 week. 09/12/2018  Substance Use Topics   Alcohol use: Yes    Comment: QOD   Drug use: No    Types: Cocaine    Home Medications Prior to Admission medications   Medication Sig Start Date End Date Taking?  Authorizing Provider  albuterol (VENTOLIN HFA) 108 (90 Base) MCG/ACT inhaler Inhale 2 puffs into the lungs every 4 (four) hours as needed for wheezing or shortness of breath. 02/10/19  Yes Collene Gobble, MD  amLODipine (NORVASC) 5 MG tablet Take 5 mg by mouth daily. 07/13/20  Yes [provider]  aspirin EC 81 MG tablet Take 81 mg by mouth daily.   Yes [provider]  lisinopril (ZESTRIL) 5 MG tablet Take 5 mg by mouth daily. 10/27/20  Yes [provider]  amoxicillin-clavulanate (AUGMENTIN) 875-125 MG tablet Take 1 tablet by mouth every 12 (twelve) hours. Patient not taking: Reported on 07/26/2021 04/30/21   Emeline Darling, PA-C    Allergies    Patient has no known allergies.  Review of Systems   Review of Systems  Constitutional:  Negative for fever.  HENT:  Negative for ear pain and sore throat.   Eyes:  Negative for visual disturbance.  Respiratory:  Negative for cough and shortness of breath.   Cardiovascular:  Negative for chest pain.  Gastrointestinal:  Positive for abdominal pain (suprapubic pain). Negative for constipation, diarrhea, nausea and vomiting.  Genitourinary:  Positive for frequency. Negative for dysuria, hematuria and urgency.  Musculoskeletal:  Negative for back pain.  Skin:  Negative for rash.  Neurological:  Negative for seizures and syncope.  All other systems reviewed and are negative.  Physical Exam Updated Vital Signs BP (!) 141/102   Pulse 66   Temp 98.1 F (36.7 C) (Oral)   Resp 13   SpO2 99%   Physical Exam Vitals and nursing note reviewed.  Constitutional:      General: He is not in acute distress.    Appearance: He is well-developed.  HENT:     Head: Normocephalic and atraumatic.  Eyes:     Conjunctiva/sclera: Conjunctivae normal.  Cardiovascular:     Rate and Rhythm: Normal rate and regular rhythm.     Heart sounds: Normal heart sounds. No murmur heard. Pulmonary:     Effort: Pulmonary effort is normal.  No respiratory distress.     Breath sounds: Normal breath sounds. No wheezing, rhonchi or rales.  Abdominal:     General: Bowel sounds are normal.     Palpations: Abdomen is soft.     Tenderness: There is no abdominal tenderness. There is no guarding or rebound.  Musculoskeletal:        General: No swelling.     Cervical back: Neck supple.     Comments: TTP to the left 4th toe. Dp pulse intact  Skin:    General: Skin is warm and dry.     Capillary Refill: Capillary refill takes less than 2 seconds.  Neurological:     Mental Status: He is alert.  Psychiatric:        Mood and Affect: Mood normal.    ED Results / Procedures / Treatments   Labs (all labs ordered are listed, but only abnormal results are displayed) Labs Reviewed  URINALYSIS, ROUTINE W REFLEX MICROSCOPIC - Abnormal; Notable for the following components:      Result Value   Color, Urine STRAW (*)    All other components within normal limits  BASIC METABOLIC PANEL - Abnormal; Notable for the following components:   Creatinine, Ser 1.39 (*)    GFR, Estimated 57 (*)    All other components within normal limits  CBC WITH DIFFERENTIAL/PLATELET    EKG None  Radiology DG Foot Complete Left  Result Date: 07/26/2021 CLINICAL DATA:  Trauma, pain EXAM: LEFT FOOT - COMPLETE 3+ VIEW COMPARISON:  04/30/2021 FINDINGS: Undisplaced fracture is seen in the shaft of proximal phalanx of fifth toe. There are no focal lytic lesions. There is soft tissue swelling over the fifth metatarsophalangeal joint. There are no opaque foreign bodies. IMPRESSION: Undisplaced fracture is seen in the shaft of proximal phalanx of left fifth toe. Electronically Signed   By: Elmer Picker M.D.   On: 07/26/2021 13:18   CT Renal Stone Study  Result Date: 07/26/2021 CLINICAL DATA:  Flank pain, kidney stone suspected EXAM: CT ABDOMEN AND PELVIS WITHOUT CONTRAST TECHNIQUE: Multidetector CT imaging of the abdomen and pelvis was performed following  the standard protocol without IV contrast. COMPARISON:  2016 FINDINGS: Lower chest: Unremarkable. Hepatobiliary: No focal liver abnormality is seen. No gallstones, gallbladder wall thickening, or biliary dilatation. Pancreas: Unremarkable. Spleen: Unremarkable. Adrenals/Urinary Tract: Adrenals, kidneys, and bladder are unremarkable. Stomach/Bowel: Stomach is within normal limits. Bowel is normal in caliber. Vascular/Lymphatic: Minor aortic atherosclerosis. No enlarged lymph nodes. Reproductive: Unremarkable. Other: No free fluid.  Abdominal wall is unremarkable. Musculoskeletal: Lower lumbar degenerative changes. No acute osseous abnormality. IMPRESSION: No acute abnormality. Electronically Signed   By: Macy Mis M.D.   On: 07/26/2021 14:36    Procedures Procedures   Medications Ordered in ED Medications - No data to display  ED Course  I have reviewed the triage vital signs and the nursing notes.  Pertinent labs & imaging results that were available during my care of the patient were reviewed by me and considered in my medical decision making (see chart for details).    MDM Rules/Calculators/A&P                          65 y/o M presenting for eval of multiple complaints including urinary frequency and toe pain  Reviewed/interpreted labs CBC unremarkable BMP unremarkable UA w/o evidence of UTI. No glucosuria  Xray left foot reviewed/interpreted -  Undisplaced fracture is seen in the shaft of proximal phalanx of left fifth toe.   Ct reviewed/interpreted and neg for acute finding  Patient was made aware of the results and plan to follow-up with Ortho about his toe fracture and urology about his urinary frequency.  He has a reassuring work-up today and I do not see any indication for further evaluation or admission at this time.  Feel is appropriate for discharge.  He is in agreement this plan and has no further questions.  Final Clinical Impression(s) / ED Diagnoses Final  diagnoses:  Urinary frequency  Closed nondisplaced fracture of phalanx of lesser toe of left foot, unspecified phalanx, initial encounter    Rx /  DC Orders ED Discharge Orders     None        Bishop Dublin 07/26/21 1726    Godfrey Pick, MD 07/26/21 1942

## 2021-07-26 NOTE — Discharge Instructions (Addendum)
You can follow up with orthopedics in regard to your toe fracture  You should follow up with urology in regards to your urinary frequency  Please return to the emergency department for any new or worsening symptoms.

## 2021-07-26 NOTE — ED Notes (Signed)
Patient transported to X-ray 

## 2021-10-18 ENCOUNTER — Other Ambulatory Visit: Payer: Self-pay

## 2021-10-18 ENCOUNTER — Encounter (HOSPITAL_COMMUNITY): Payer: Self-pay | Admitting: Dentistry

## 2021-10-18 ENCOUNTER — Ambulatory Visit (INDEPENDENT_AMBULATORY_CARE_PROVIDER_SITE_OTHER): Payer: Medicaid - Dental | Admitting: Dentistry

## 2021-10-18 VITALS — BP 140/104 | HR 80 | Temp 98.5°F

## 2021-10-18 DIAGNOSIS — K0530 Chronic periodontitis, unspecified: Secondary | ICD-10-CM | POA: Diagnosis not present

## 2021-10-18 DIAGNOSIS — C090 Malignant neoplasm of tonsillar fossa: Secondary | ICD-10-CM

## 2021-10-18 DIAGNOSIS — F40232 Fear of other medical care: Secondary | ICD-10-CM

## 2021-10-18 DIAGNOSIS — K083 Retained dental root: Secondary | ICD-10-CM

## 2021-10-18 DIAGNOSIS — K117 Disturbances of salivary secretion: Secondary | ICD-10-CM

## 2021-10-18 DIAGNOSIS — Z923 Personal history of irradiation: Secondary | ICD-10-CM

## 2021-10-18 DIAGNOSIS — M264 Malocclusion, unspecified: Secondary | ICD-10-CM

## 2021-10-18 DIAGNOSIS — K08109 Complete loss of teeth, unspecified cause, unspecified class: Secondary | ICD-10-CM

## 2021-10-18 DIAGNOSIS — Z0120 Encounter for dental examination and cleaning without abnormal findings: Secondary | ICD-10-CM

## 2021-10-18 DIAGNOSIS — K036 Deposits [accretions] on teeth: Secondary | ICD-10-CM

## 2021-10-18 DIAGNOSIS — K029 Dental caries, unspecified: Secondary | ICD-10-CM | POA: Diagnosis not present

## 2021-10-18 DIAGNOSIS — K045 Chronic apical periodontitis: Secondary | ICD-10-CM

## 2021-10-18 NOTE — Progress Notes (Signed)
Department of Dental Medicine   Service Date:   10/18/2021  Patient Name:  [name] [name] Date of Birth:   [birth year] Medical Record Number: [id number]  Referring Provider:           Eppie Gibson, M.D.   TODAY'S VISIT: COMPREHENSIVE EXAM   EXAM: Soft tissue:  WNL Caries risk:  High Periodontal impression:  Chronic periodontitis; inflamed & erythematous gingival tissue. Other findings:  Xerostomia  PLAN:  After a discussion & exploring various treatment options, the following treatment plan was finalized: Full mouth extractions C/C denture fabrication (4-6 mos healing after extractions) Recall interval:  1 year edentulous recall checks  NEXT VISIT:  Full mouth extractions       10/18/2021  PROGRESS NOTE:   HISTORY OF PRESENT ILLNESS: [name] [name] is a very pleasant 66 y.o. male with h/o COPD, hepatitis C, hypertension, CKD (stage 3), tobacco use, alcohol abuse, anxiety, chronic sinusitis and tonsil cancer (status-post chemoradiation therapy in 2017; radiation completed on 07/10/2016) who presents today for a comprehensive dental exam. The patient was referred by Dr. Isidore Moos back in September 2022 due to the patient having difficulty seeing a dentist for extractions.   DENTAL HISTORY: The patient is a patient of record at the hospital dental clinic.  He was previously seen by Dr. Enrique Sack for pre-head and neck radiation dental clearance in 04/2016 for [facility] of the left tonsil.  Dr. Enrique Sack then took the patient to the O.R. to extract multiple teeth prior to the patient starting radiation. He has recently had 1 or 2 teeth pulled due to pain/infection at an outside dental office.  He currently denies any dental/orofacial pain or sensitivity. Patient is able to manage oral secretions.  Patient denies dysphagia, odynophagia, dysphonia, SOB and neck pain.  Patient denies fever, rigors and malaise.   CHIEF COMPLAINT: Here for a comprehensive dental exam.   Patient Active Problem  List   Diagnosis Date Noted   Essential hypertension 11/15/2020   COPD with acute exacerbation ([place]) 02/10/2019   Other fatigue 09/17/2017   Neck stiffness 08/03/2017   Multiple lung nodules on CT 12/05/2016   Alcohol abuse 11/07/2016   Multifocal pneumonia 10/04/2016   Chronic kidney disease, stage III (moderate) ([place]) 09/14/2016   Dry mouth 09/14/2016   Dysgeusia 09/05/2016   Bronchitis 09/01/2016   Anemia due to chronic illness 08/24/2016   Acute prerenal failure ([place]) 07/24/2016   Hemoptysis 07/24/2016   Pancytopenia, acquired ([place]) 07/04/2016   Atypical chest pain 06/27/2016   Bilateral tinnitus 05/30/2016   Hypersensitivity reaction 05/23/2016   Lesion of right lung 04/26/2016   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[place]) 04/24/2016   History of hepatitis C 04/24/2016   Anxiety    Dyspnea 03/25/2014   COPD GOLD II 07/25/2013   Sinusitis, chronic 07/25/2013   Tobacco abuse 07/25/2013   Past Medical History:  Diagnosis Date   Anxiety    Cancer ([place]) 04/04/2016   cancer of left tonsil   CKD (chronic kidney disease), stage III (HCC)    COPD (chronic obstructive pulmonary disease) ([place])    Hepatitis    Hepatitis C    Treatment x 8 weeks 6-7 months ago Harvoni   History of radiation therapy 05/23/16- 07/10/16   Left Tonsil and bilateral neck   Sinus congestion    Stomach ulcer    Past Surgical History:  Procedure Laterality Date   COLONOSCOPY WITH PROPOFOL N/A 04/14/2015   Procedure: COLONOSCOPY WITH PROPOFOL;  Surgeon: Arta Silence, MD;  [facility];  Service: Endoscopy;  Laterality: N/A;   HERNIA REPAIR     IR GENERIC HISTORICAL  05/17/2016   IR FLUORO GUIDE PORT INSERTION RIGHT 05/17/2016 Jacqulynn Cadet, MD [facility]   IR GENERIC HISTORICAL  05/17/2016   IR US GUIDE VASC ACCESS RIGHT 05/17/2016 Jacqulynn Cadet, MD [facility]   IR GENERIC HISTORICAL  05/17/2016   IR GASTROSTOMY TUBE MOD SED 05/17/2016 Jacqulynn Cadet, MD [facility]   IR  GENERIC HISTORICAL  11/13/2016   IR GASTROSTOMY TUBE REMOVAL 11/13/2016 Greggory Keen, MD [facility]   IR GENERIC HISTORICAL  11/13/2016   IR REMOVAL TUN ACCESS W/ PORT W/O FL MOD SED 11/13/2016 Greggory Keen, MD [facility]   MULTIPLE EXTRACTIONS WITH ALVEOLOPLASTY N/A 05/03/2016   Procedure: Multiple extractions with alveoloplasty and gross debridement of remaining teeth.;  Surgeon: Lenn Cal, DDS;  Location: [facility];  Service: Oral Surgery;  Laterality: N/A;   STOMACH SURGERY     20 years   No Known Allergies Current Outpatient Medications  Medication Sig Dispense Refill   albuterol (VENTOLIN HFA) 108 (90 Base) MCG/ACT inhaler Inhale 2 puffs into the lungs every 4 (four) hours as needed for wheezing or shortness of breath. 1 Inhaler 1   amLODipine (NORVASC) 5 MG tablet Take 5 mg by mouth daily.     amoxicillin-clavulanate (AUGMENTIN) 875-125 MG tablet Take 1 tablet by mouth every 12 (twelve) hours. (Patient not taking: Reported on 07/26/2021) 14 tablet 0   aspirin EC 81 MG tablet Take 81 mg by mouth daily.     lisinopril (ZESTRIL) 5 MG tablet Take 5 mg by mouth daily.     No current facility-administered medications for this visit.    LABS: Lab Results  Component Value Date   WBC 7.1 07/26/2021   HGB 15.2 07/26/2021   HCT 44.3 07/26/2021   MCV 95.9 07/26/2021   PLT 247 07/26/2021      Component Value Date/Time   NA 135 07/26/2021 1256   NA 139 07/30/2017 0802   K 4.2 07/26/2021 1256   K 4.9 07/30/2017 0802   CL 99 07/26/2021 1256   CO2 26 07/26/2021 1256   CO2 27 07/30/2017 0802   GLUCOSE 95 07/26/2021 1256   GLUCOSE 97 07/30/2017 0802   BUN 21 07/26/2021 1256   BUN 21.0 07/30/2017 0802   CREATININE 1.39 (H) 07/26/2021 1256   CREATININE 1.86 (H) 10/09/2019 0801   CREATININE 1.6 (H) 07/30/2017 0802   CALCIUM 9.4 07/26/2021 1256   CALCIUM 10.1 07/30/2017 0802   GFRNONAA 57 (L) 07/26/2021 1256   GFRNONAA 38 (L) 10/09/2019 0801   GFRAA 50 (L) 04/19/2020 1506    GFRAA 44 (L) 10/09/2019 0801   Lab Results  Component Value Date   INR 1.0 04/19/2020   INR 0.96 11/13/2016   INR 0.95 05/17/2016   No results found for: PTT  Social History   Socioeconomic History   Marital status: Single    Spouse name: Not on file   Number of children: 3   Years of education: Not on file   Highest education level: Not on file  Occupational History   Occupation: disabled  Tobacco Use   Smoking status: Some Days    Packs/day: 0.50    Years: 30.00    Pack years: 15.00    Types: Cigarettes   Smokeless tobacco: Never   Tobacco comments:    says he hasn't smoked in 1 week. 09/12/2018  Substance and Sexual Activity  Alcohol use: Yes    Comment: QOD   Drug use: No    Types: Cocaine   Sexual activity: Yes  Other Topics Concern   Not on file  Social History Narrative   Lives with mother.    Mother is disabled with mental illness. He is the principal care giver   Has 3 children.   [name] is an important contact   He does not drive. Takes a bus. Ride a bike.   Social Determinants of Health   Financial Resource Strain: Not on file  Food Insecurity: Not on file  Transportation Needs: Not on file  Physical Activity: Not on file  Stress: Not on file  Social Connections: Not on file  Intimate Partner Violence: Not on file   Family History  Problem Relation Age of Onset   Hypertension Mother    Anxiety disorder Mother    Diabetes Mother    Cancer Father      REVIEW OF SYSTEMS:  Reviewed with the patient as per HPI. Psych:  (+) Dental phobia   VITAL SIGNS: BP (!) 140/104 (BP Location: Left Arm, Patient Position: Sitting, Cuff Size: Normal)    Pulse 80    Temp 98.5 F (36.9 C) (Oral)    PHYSICAL EXAM:  Soft tissue exam completed and charted.  General:  Well-developed, comfortable and in no apparent distress. Neurological:  Alert and oriented to person, place and  time. Extraoral:  Head size, head shape, facial symmetry, skin,  complexion, pigmentation, conjunctiva, oral labia, parotid gland, submandibular gland, thyroid, cervical and preauricular lymph nodes, submandibular and submental lymph nodes, tonsillar and occipital lymph nodes and supraclavicular lymph nodes WNL. No swelling or lymphadenopathy.  TMJ asymptomatic without clicks or crepitations. Intraoral:  Soft tissues appear well-perfused and mucous membranes moist.  FOM and vestibules soft and not raised. Oral cavity without mass or lesion. No signs of infection, parulis, sinus tract, edema or erythema evident upon exam. (+) Mucous membranes:  Thick, viscous saliva   DENTAL EXAM:  Hard tissue and periodontal exam completed and charted.     OVERALL IMPRESSION:  Poor remaining dentition.       ORAL HYGIENE:  Poor       HARD TISSUE:     MISSING TEETH:  #1, #2, #3, #5, #7, #12, #13, #14, #15, #16, #17- #21, #30, #31, #32  CARIES: Non-restorable:  #6, #8, #9, #10, #11, #22, #23, #24 Poor prognosis:  #25, #26, #27, #28, #29 RETAINED ROOTS:  #8, #9, #10,  #24, #25 PROSTHODONTICS:  Maxillary and mandibular existing partial dentures that the patient is no longer able to wear due to missing teeth and caries. OCCLUSION:  Class 3 malocclusion Non-functional teeth:  Remaining dentition is non-functional Supra-erupted teeth:  #4, #11       PERIODONTAL:  PLAQUE:  Slight generalized GINGIVAL APPEARANCE:  Inflamed and erythematous gingival tissue     Exam performed by Giancarlos Berendt B. Benson Norway, D.M.D. with Leta Speller, DAII acting as scribe.   RADIOGRAPHIC EXAM:  PAN and Full Mouth Series exposed and interpreted.      Condyles seated bilaterally in fossas.  No evidence of abnormal pathology.  All visualized osseous structures appear WNL.  Missing most posterior teeth.  #4 & #11 are supra-erupted.      Localized mild horizontal bone loss consistent with mild periodontitis.     Periapical radiolucencies:  #6, #8, #9, #11 & #24.   Caries:  rampant decay in all  4 quadrants of remaining dentition.  #  6, #8, #9, #10, #11, #22, #23, #24, #25, #26 & #27 have deep decay approximating the pulp; #8, #9, #10, #24 & #25 are retained root tips.     ASSESSMENT:  1.  History of head and neck radiation therapy 2.  History of SCC of left tonsil 3.  Comprehensive dental exam 4.  Missing teeth 5.  Radiation caries 6.  Retained root tips 7.  Chronic apical periodontitis 8.  Chronic periodontitis 9.  Accretions on teeth 10.  Malocclusion 11.  Xerostomia 12.  Dental Phobia   PLAN/RECOMMENDATIONS: I discussed the risks, benefits, and complications of various scenarios with the patient in relationship to their medical and dental conditions.  I explained all significant findings of the dental consultation with the patient including rampant decay of his remaining dentition and the recommended care including full mouth extractions with alveoloplasty and pre-prosthetic surgery as needed in order to optimize them from a dental standpoint and to prepare him for dentures/replacement of missing teeth in the future.  The patient verbalized understanding of all findings, discussion, and recommendations. We then discussed various treatment options to include no treatment, multiple extractions with alveoloplasty, pre-prosthetic surgery as indicated, periodontal therapy, dental restorations, root canal therapy, crown and bridge therapy, implant therapy, and replacement of missing teeth as indicated.   The patient verbalized understanding of all options, and currently wishes to proceed with the treatment plan below:  Full mouth extractions Maxillary and mandibular complete denture fabrication (after 4-6 weeks of healing following extractions) Recall interval: 1 year edentulous recall visits  Plan to discuss all findings and recommendations with medical team.  NEXT VISIT:  Full mouth extractions  All questions and concerns were invited and addressed.  The patient tolerated  today's visit well and departed in stable condition.  I spent in excess of 120 minutes during the conduct of this consultation and >50% of this time involved direct face-to-face encounter for counseling and/or coordination of the patient's care.   Charlaine Dalton, D.M.D.

## 2021-10-28 DIAGNOSIS — K083 Retained dental root: Secondary | ICD-10-CM | POA: Insufficient documentation

## 2021-10-28 DIAGNOSIS — K029 Dental caries, unspecified: Secondary | ICD-10-CM | POA: Insufficient documentation

## 2021-10-28 DIAGNOSIS — K08109 Complete loss of teeth, unspecified cause, unspecified class: Secondary | ICD-10-CM | POA: Insufficient documentation

## 2021-10-28 DIAGNOSIS — Z923 Personal history of irradiation: Secondary | ICD-10-CM | POA: Insufficient documentation

## 2021-10-28 DIAGNOSIS — Z0120 Encounter for dental examination and cleaning without abnormal findings: Secondary | ICD-10-CM | POA: Insufficient documentation

## 2021-10-28 DIAGNOSIS — K117 Disturbances of salivary secretion: Secondary | ICD-10-CM | POA: Insufficient documentation

## 2021-10-28 DIAGNOSIS — F40232 Fear of other medical care: Secondary | ICD-10-CM | POA: Insufficient documentation

## 2021-11-04 ENCOUNTER — Telehealth (HOSPITAL_COMMUNITY): Payer: Self-pay

## 2021-11-04 NOTE — Telephone Encounter (Signed)
I called patient to inform him that his upcoming dental surgery with Dental Medicine would have to be cancelled at this time due to Dr. Benson Norway going on a Medical Leave of Absence from 11-04-21 thru 01-30-22. I gave patient the option to wait until she returns or to be ref'd to a local Oral Surgeon, Dr. Frederik Schmidt. Patient told me that he wanted to wait until Dr. Benson Norway returned from her leave, because he wanted our clinic to do the surgery and none of his teeth were hurting him. I told patient to call me with any concerns he had during Dr. Kathrynn Speed leave and I would be able to assist him the best I could. I also explained that he would need to leave a message and I would return his call as soon as I could. Patient agreed.  ?

## 2021-12-15 ENCOUNTER — Telehealth: Payer: Self-pay

## 2021-12-15 NOTE — Telephone Encounter (Signed)
[name] [name] states that he has been experiencing intermittent right side pain from his waist to chest.  The pain begun ~1 week ago.  He saw his PCP Dr. Earnestine Leys yesterday.  She is going to obtain an [place] of his liver he said.  He wanted Dr. Alvy Bimler to order a chest x-ray as there were some nodules seen on his X-rays and scans when he was being treated for his cancer. He is anxious about the pain.  ?Told him that he can ask his PCP to do this.  If Dr. Marcia Brash finds anything that may indicate a recurrence of his cancer, then she would be in touch with Dr. Alvy Bimler. Pt verbalized understanding. ?

## 2021-12-21 ENCOUNTER — Other Ambulatory Visit: Payer: Self-pay | Admitting: Family Medicine

## 2021-12-21 DIAGNOSIS — F1010 Alcohol abuse, uncomplicated: Secondary | ICD-10-CM

## 2021-12-21 DIAGNOSIS — R140 Abdominal distension (gaseous): Secondary | ICD-10-CM

## 2021-12-21 DIAGNOSIS — B1920 Unspecified viral hepatitis C without hepatic coma: Secondary | ICD-10-CM

## 2021-12-26 ENCOUNTER — Ambulatory Visit
Admission: RE | Admit: 2021-12-26 | Discharge: 2021-12-26 | Disposition: A | Discharge status: Home / Self Care | Payer: Medicaid Other | Source: Ambulatory Visit | Attending: Family Medicine | Admitting: Family Medicine

## 2021-12-26 DIAGNOSIS — R140 Abdominal distension (gaseous): Secondary | ICD-10-CM

## 2021-12-26 DIAGNOSIS — F1010 Alcohol abuse, uncomplicated: Secondary | ICD-10-CM

## 2021-12-26 DIAGNOSIS — B1920 Unspecified viral hepatitis C without hepatic coma: Secondary | ICD-10-CM

## 2022-04-13 ENCOUNTER — Other Ambulatory Visit: Payer: Self-pay | Admitting: Gastroenterology

## 2022-04-13 DIAGNOSIS — R1030 Lower abdominal pain, unspecified: Secondary | ICD-10-CM

## 2022-04-14 ENCOUNTER — Emergency Department (HOSPITAL_COMMUNITY): Payer: Medicaid Other

## 2022-04-14 ENCOUNTER — Other Ambulatory Visit: Payer: Self-pay

## 2022-04-14 ENCOUNTER — Emergency Department (HOSPITAL_COMMUNITY)
Admission: EM | Admit: 2022-04-14 | Discharge: 2022-04-14 | Disposition: A | Discharge status: Home / Self Care | Payer: Medicaid Other | Attending: Emergency Medicine | Admitting: Emergency Medicine

## 2022-04-14 ENCOUNTER — Encounter (HOSPITAL_COMMUNITY): Payer: Self-pay

## 2022-04-14 DIAGNOSIS — S20212A Contusion of left front wall of thorax, initial encounter: Secondary | ICD-10-CM

## 2022-04-14 DIAGNOSIS — N189 Chronic kidney disease, unspecified: Secondary | ICD-10-CM | POA: Diagnosis not present

## 2022-04-14 DIAGNOSIS — S20219A Contusion of unspecified front wall of thorax, initial encounter: Secondary | ICD-10-CM | POA: Diagnosis not present

## 2022-04-14 DIAGNOSIS — S299XXA Unspecified injury of thorax, initial encounter: Secondary | ICD-10-CM | POA: Diagnosis present

## 2022-04-14 NOTE — ED Notes (Signed)
Patient verbalizes understanding of discharge instructions. Opportunity for questioning and answers were provided. Pt discharged from ED. 

## 2022-04-14 NOTE — Discharge Instructions (Signed)
Your x-ray was negative for fractures or for other problems with the lungs today.  It would be best for you to continue Tylenol, up to 2 extra strength Tylenol every 8 hours, as needed for pain.  You may also use ice or heat on the area for comfort.  Remember to take 10 deep breaths every hour to help decrease the risk of pneumonia.
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
## 2022-04-20 ENCOUNTER — Ambulatory Visit
Admission: RE | Admit: 2022-04-20 | Discharge: 2022-04-20 | Disposition: A | Discharge status: Home / Self Care | Payer: Medicaid Other | Source: Ambulatory Visit | Attending: Gastroenterology | Admitting: Gastroenterology

## 2022-04-20 DIAGNOSIS — R1030 Lower abdominal pain, unspecified: Secondary | ICD-10-CM

## 2022-04-20 MED ORDER — IOPAMIDOL (ISOVUE-300) INJECTION 61%
100.0000 mL | Freq: Once | INTRAVENOUS | Status: AC | PRN
  Administered 2022-04-20: 100 mL via INTRAVENOUS

## 2022-08-31 ENCOUNTER — Other Ambulatory Visit: Payer: Self-pay

## 2022-08-31 ENCOUNTER — Encounter (HOSPITAL_COMMUNITY): Payer: Self-pay

## 2022-08-31 ENCOUNTER — Emergency Department (HOSPITAL_COMMUNITY)
Admission: EM | Admit: 2022-08-31 | Discharge: 2022-09-01 | Disposition: A | Discharge status: Home / Self Care | Payer: Medicaid Other | Attending: Emergency Medicine | Admitting: Emergency Medicine

## 2022-08-31 ENCOUNTER — Emergency Department (HOSPITAL_COMMUNITY): Payer: Medicaid Other

## 2022-08-31 DIAGNOSIS — Z7982 Long term (current) use of aspirin: Secondary | ICD-10-CM | POA: Diagnosis not present

## 2022-08-31 DIAGNOSIS — Z1152 Encounter for screening for COVID-19: Secondary | ICD-10-CM | POA: Insufficient documentation

## 2022-08-31 DIAGNOSIS — K219 Gastro-esophageal reflux disease without esophagitis: Secondary | ICD-10-CM | POA: Insufficient documentation

## 2022-08-31 DIAGNOSIS — R079 Chest pain, unspecified: Secondary | ICD-10-CM | POA: Diagnosis present

## 2022-08-31 LAB — COMPREHENSIVE METABOLIC PANEL
ALT: 55 U/L — ABNORMAL HIGH (ref 0–44)
AST: 98 U/L — ABNORMAL HIGH (ref 15–41)
Albumin: 4.6 g/dL (ref 3.5–5.0)
Alkaline Phosphatase: 72 U/L (ref 38–126)
Anion gap: 20 — ABNORMAL HIGH (ref 5–15)
BUN: 16 mg/dL (ref 8–23)
CO2: 16 mmol/L — ABNORMAL LOW (ref 22–32)
Calcium: 9.4 mg/dL (ref 8.9–10.3)
Chloride: 101 mmol/L (ref 98–111)
Creatinine, Ser: 1.47 mg/dL — ABNORMAL HIGH (ref 0.61–1.24)
GFR, Estimated: 53 mL/min — ABNORMAL LOW (ref >60)
Glucose, Bld: 70 mg/dL (ref 70–99)
Potassium: 5.1 mmol/L (ref 3.5–5.1)
Sodium: 137 mmol/L (ref 135–145)
Total Bilirubin: 1.3 mg/dL — ABNORMAL HIGH (ref 0.3–1.2)
Total Protein: 7.8 g/dL (ref 6.5–8.1)

## 2022-08-31 LAB — TROPONIN I (HIGH SENSITIVITY)
Troponin I (High Sensitivity): 7 ng/L (ref <18)
Troponin I (High Sensitivity): 8 ng/L (ref <18)

## 2022-08-31 LAB — CBC WITH DIFFERENTIAL/PLATELET
Abs Immature Granulocytes: 0.02 10*3/uL (ref 0.00–0.07)
Basophils Absolute: 0 10*3/uL (ref 0.0–0.1)
Basophils Relative: 1 %
Eosinophils Absolute: 0.1 10*3/uL (ref 0.0–0.5)
Eosinophils Relative: 2 %
HCT: 44.4 % (ref 39.0–52.0)
Hemoglobin: 14.6 g/dL (ref 13.0–17.0)
Immature Granulocytes: 0 %
Lymphocytes Relative: 18 %
Lymphs Abs: 1 10*3/uL (ref 0.7–4.0)
MCH: 32.3 pg (ref 26.0–34.0)
MCHC: 32.9 g/dL (ref 30.0–36.0)
MCV: 98.2 fL (ref 80.0–100.0)
Monocytes Absolute: 0.7 10*3/uL (ref 0.1–1.0)
Monocytes Relative: 12 %
Neutro Abs: 3.7 10*3/uL (ref 1.7–7.7)
Neutrophils Relative %: 67 %
Platelets: 239 10*3/uL (ref 150–400)
RBC: 4.52 MIL/uL (ref 4.22–5.81)
RDW: 14.3 % (ref 11.5–15.5)
WBC: 5.5 10*3/uL (ref 4.0–10.5)
nRBC: 0 % (ref 0.0–0.2)

## 2022-08-31 LAB — RESP PANEL BY RT-PCR (RSV, FLU A&B, COVID)  RVPGX2
Influenza A by PCR: NEGATIVE
Influenza B by PCR: NEGATIVE
Resp Syncytial Virus by PCR: NEGATIVE
SARS Coronavirus 2 by RT PCR: NEGATIVE

## 2022-08-31 LAB — LIPASE, BLOOD: Lipase: 37 U/L (ref 11–51)

## 2022-08-31 MED ORDER — LIDOCAINE 5 % EX PTCH
1.0000 | MEDICATED_PATCH | CUTANEOUS | Status: DC
  Administered 2022-08-31: 1 via TRANSDERMAL
  Filled 2022-08-31: qty 1

## 2022-08-31 NOTE — ED Provider Triage Note (Signed)
Emergency Medicine Provider Triage Evaluation Note  [name] [name] , a 66 y.o. male  was evaluated in triage.  Pt complains of L chest pain that is worse after vomiting. He states for last 4-5 days has been dry heaving, sick to his stomach and having diarrhea. After vomiting has chest pain. States chest pain has been pretty persistent.  Review of Systems  Positive: Chest pain, N/V/D Negative: Abdominal pain  Physical Exam  BP (!) 142/99 (BP Location: Right Arm)   Pulse 92   Temp 99.3 F (37.4 C) (Oral)   Resp 18   Ht 6' (1.829 m)   Wt 62.6 kg   SpO2 96%   BMI 18.72 kg/m  Gen:   Awake, no distress   Resp:  Normal effort  MSK:   Moves extremities without difficulty  Other:  TTP of left chest wall  Medical Decision Making  Medically screening exam initiated at 3:26 PM.  Appropriate orders placed.  [name] was informed that the remainder of the evaluation will be completed by another provider, this initial triage assessment does not replace that evaluation, and the importance of remaining in the ED until their evaluation is complete.     Osvaldo Shipper, [place] 08/31/22 [phone]

## 2022-08-31 NOTE — ED Triage Notes (Signed)
Pt c/o squeezing left sided chest painx4d. Pt c/o nausea. Pt denies vomiting and SOB.

## 2022-09-01 MED ORDER — ALUM & MAG HYDROXIDE-SIMETH 200-200-20 MG/5ML PO SUSP
30.0000 mL | Freq: Once | ORAL | Status: AC
  Administered 2022-09-01: 30 mL via ORAL
  Filled 2022-09-01: qty 30

## 2022-09-01 MED ORDER — LIDOCAINE VISCOUS HCL 2 % MT SOLN
15.0000 mL | Freq: Once | OROMUCOSAL | Status: AC
  Administered 2022-09-01: 15 mL via ORAL
  Filled 2022-09-01: qty 15

## 2022-09-01 MED ORDER — LIDOCAINE VISCOUS HCL 2 % MT SOLN: 10.0000 mL | Freq: Four times a day (QID) | OROMUCOSAL | 0 refills | Status: DC | PRN

## 2022-09-01 MED ORDER — FAMOTIDINE 20 MG PO TABS: 20.0000 mg | ORAL_TABLET | Freq: Every day | ORAL | 0 refills | Status: DC

## 2022-09-01 NOTE — ED Notes (Signed)
Rounded on patient, patient in room

## 2022-09-01 NOTE — ED Provider Notes (Cosign Needed Addendum)
[place] EMERGENCY DEPARTMENT Provider Note   CSN: [id number] Arrival date & time: 08/31/22  1512     History  Chief Complaint  Patient presents with   Chest Pain    [name] [name] is a 66 y.o. male, h/o peptic ulcers, tonsillar cancer, presented today for 4d h/o CP. Chest pain is 1/10 dull constant pain that is localized to the L side of chest. Patient endorsed nausea but no vomiting. Patient endorsed symptoms improve with activity. Patient denied radiation of pain to back, L jaw/neck/shoulder, abd. Patient denied HA, recent illness, fevers, syncope, shortness of breath, dysuria, syncope, abd pain, confusion, vision changes, tooth pain, recent travels/hospitalizations, hemoptysis, unexpected weight loss. Patient stated pain is worse morning after drinking beer and that antacids have helped relieve symptoms. Patient also endorsed recent dental extraction back in Aug/Sept.      Home Medications Prior to Admission medications   Medication Sig Start Date End Date Taking? Authorizing Provider  famotidine (PEPCID) 20 MG tablet Take 1 tablet (20 mg total) by mouth daily. 09/01/22 10/01/22 Yes Alleyah Twombly, Florene Route, PA-C  lidocaine (XYLOCAINE) 2 % solution Use as directed 10 mLs in the mouth or throat every 6 (six) hours as needed (stomach pain). 09/01/22  Yes Cardama, Grayce Sessions, MD  albuterol (VENTOLIN HFA) 108 (90 Base) MCG/ACT inhaler Inhale 2 puffs into the lungs every 4 (four) hours as needed for wheezing or shortness of breath. 02/10/19   Collene Gobble, MD  amLODipine (NORVASC) 5 MG tablet Take 5 mg by mouth daily. 07/13/20   [provider]  aspirin EC 81 MG tablet Take 81 mg by mouth daily.    [provider]  lisinopril (ZESTRIL) 5 MG tablet Take 5 mg by mouth daily. 10/27/20   [provider]      Allergies    Patient has no known allergies.    Review of Systems   Review of Systems  Constitutional:  Negative for chills, diaphoresis,  fatigue, fever and unexpected weight change.  HENT:  Positive for dental problem (recent tooth extraction). Negative for sore throat.   Eyes:  Negative for visual disturbance.  Respiratory:  Negative for cough and shortness of breath.   Cardiovascular:  Positive for chest pain (L side of Chest).  Gastrointestinal:  Negative for abdominal pain, blood in stool, constipation, diarrhea and nausea.  Genitourinary:  Negative for dysuria.  Musculoskeletal:  Negative for back pain and neck pain.  Skin:  Negative for color change.  Neurological:  Negative for syncope, weakness and headaches.  Psychiatric/Behavioral:  Negative for confusion.     Physical Exam Updated Vital Signs BP (!) 155/104   Pulse 74   Temp 98.4 F (36.9 C)   Resp 13   Ht 6' (1.829 m)   Wt 62.6 kg   SpO2 100%   BMI 18.72 kg/m  Physical Exam HENT:     Head: Normocephalic.  Eyes:     Extraocular Movements: Extraocular movements intact.     Pupils: Pupils are equal, round, and reactive to light.  Neck:     Vascular: No JVD.  Cardiovascular:     Rate and Rhythm: Normal rate and regular rhythm.     Pulses:          Radial pulses are 2+ on the right side and 2+ on the left side.     Heart sounds: Normal heart sounds. Heart sounds not distant. No murmur heard.    No friction rub.  Comments: No JVD Pulmonary:     Effort: Pulmonary effort is normal.     Breath sounds: Normal breath sounds.     Comments: No absent breath sounds Chest:     Chest wall: No mass, deformity, tenderness or crepitus.  Abdominal:     Palpations: Abdomen is soft.     Tenderness: There is no abdominal tenderness. There is no guarding or rebound.  Musculoskeletal:        General: Normal range of motion.     Cervical back: Normal range of motion.     Right lower leg: No edema.     Left lower leg: No edema.  Skin:    General: Skin is warm and dry.     Capillary Refill: Capillary refill takes less than 2 seconds.  Neurological:      General: No focal deficit present.     Mental Status: He is alert and oriented to person, place, and time.  Psychiatric:        Mood and Affect: Mood normal.     ED Results / Procedures / Treatments   Labs (all labs ordered are listed, but only abnormal results are displayed) Labs Reviewed  COMPREHENSIVE METABOLIC PANEL - Abnormal; Notable for the following components:      Result Value   CO2 16 (*)    Creatinine, Ser 1.47 (*)    AST 98 (*)    ALT 55 (*)    Total Bilirubin 1.3 (*)    GFR, Estimated 53 (*)    Anion gap 20 (*)    All other components within normal limits  RESP PANEL BY RT-PCR (RSV, FLU A&B, COVID)  RVPGX2  CBC WITH DIFFERENTIAL/PLATELET  LIPASE, BLOOD  TROPONIN I (HIGH SENSITIVITY)  TROPONIN I (HIGH SENSITIVITY)    EKG EKG Interpretation  Date/Time:  Friday September 01 2022 04:32:18 EST Ventricular Rate:  70 PR Interval:  138 QRS Duration: 103 QT Interval:  412 QTC Calculation: 445 R Axis:   115 Text Interpretation: Sinus rhythm Consider left atrial enlargement Right axis deviation RSR' in V1 or V2, probably normal variant Confirmed by Addison Lank [phone]) on 09/01/2022 4:51:06 AM  Radiology DG Abdomen 1 View  Result Date: 08/31/2022 CLINICAL DATA:  Chest pain, nausea, vomiting EXAM: ABDOMEN - 1 VIEW COMPARISON:  CT done on 04/20/2022 and sonogram done on 12/26/2021 FINDINGS: Bowel gas pattern is nonspecific. No abnormal masses or calcifications are seen. Degenerative changes are noted in lumbar spine with disc space narrowing, bony spurs and facet hypertrophy at multiple levels. IMPRESSION: Nonspecific bowel gas pattern.  Lumbar spondylosis. Electronically Signed   By: Elmer Picker M.D.   On: 08/31/2022 17:01   DG Chest 2 View  Result Date: 08/31/2022 CLINICAL DATA:  Chest pain EXAM: CHEST - 2 VIEW COMPARISON:  04/14/2022 FINDINGS: Cardiac size is within normal limits. Low position of diaphragms may be normal variation due to patient's body  habitus or suggest COPD. Small linear patchy density in both apices may suggest scarring with no significant interval change. There are no new infiltrates or signs of pulmonary edema. There is no pleural effusion or pneumothorax. IMPRESSION: There are no new infiltrates or signs of pulmonary edema. Electronically Signed   By: Elmer Picker M.D.   On: 08/31/2022 16:58    Procedures Procedures    Medications Ordered in ED Medications  lidocaine (LIDODERM) 5 % 1 patch (1 patch Transdermal Patch Removed 09/01/22 0435)  alum & mag hydroxide-simeth (MAALOX/MYLANTA) 200-200-20 MG/5ML suspension 30 mL (30  mLs Oral Given 09/01/22 0433)    And  lidocaine (XYLOCAINE) 2 % viscous mouth solution 15 mL (15 mLs Oral Given 09/01/22 0086)    ED Course/ Medical Decision Making/ A&P                           Medical Decision Making TAESHAWN HELFMAN 66 y.o. presented today for 4d h/o CP. Some life threatening, surgical, emergent diagnoses I considered were ACS, PE, PNX, Aortic Dissection, Esophageal Rupture, Tamponade, Pancreatitis.  Review of prior external notes: 06/26/22 Office Visit  Unique Tests and My Own Interpretation: - CXR: hyperinflation of lungs, no acute cardiopulmonary changes - EKG: regular rate with non-specific ST changes - Trop: not elevated - BMP: unremarkable - CBC: unremarkable - Lipase: unremarkable - Resp Panel: unremarkable  Discussion with Independent Historian: none  Discussion of Management of Tests: none  Risk:   Medium:  - prescription drug management  Risk Stratification Score: HEART: 4 (slightly suspicious history, EKG with non-specific changes, Age 44, >3 RF, normal troponin)  Staffed with Addison Lank, MD  Plan: DDx included and ultimately r/o: -ACS - EKG was negative for ACS along with negative serial trops. Patient did have a HEART score of 4 but presentation is highly atypical of ACS - PE - patient did not endorse shortness of breath, hemoptysis,  or previous leg swelling indicative of DVT  -Tamponade - no JVD noted on exam nor muffled heart sounds or hypotension - PNX - lungs were CTAB. CXR did not show any acute cardiopulmonary changes - Costochondritis - pain was not reproducible with palpation -Esophageal rupture - no history of severe emesis to suggest this diagnosis -Aortic dissection - CP did not radiate to back and no murmurs were auscultated on exam. CXR did not show any acute cardiopulmonary changes -Pericarditis - no recent illnesses or friction rub noted on exam -Pancreatitis - lipase was not elevated   Most likely diagnosis at this time is GERD due to patient endorsing worsening of symptoms after drinking beer and resolution of symptoms when up and active, along with relief from antacids. Patient received vicious lidocaine with alum-mag hydroxide for symptoms and upon recheck stated the chest pain had resolved. Patient will be discharged with vicious lidocaine and famotidine to help manage symptoms until he is able to see his PCP for long term care. Patient was educated that if symptoms worsen, to return to ED. Patient verbalized understanding of plan.  Risk OTC drugs. Prescription drug management.           Final Clinical Impression(s) / ED Diagnoses Final diagnoses:  Gastroesophageal reflux disease without esophagitis    Rx / DC Orders ED Discharge Orders          Ordered    lidocaine (XYLOCAINE) 2 % solution  Every 6 hours PRN        09/01/22 0615    famotidine (PEPCID) 20 MG tablet  Daily        09/01/22 0619              Chuck Hint, PA-C 09/01/22 0621    Chuck Hint, PA-C 09/01/22 0652    Chuck Hint, PA-C 09/01/22 1244

## 2023-02-23 ENCOUNTER — Other Ambulatory Visit: Payer: Self-pay | Admitting: Family Medicine

## 2023-02-23 ENCOUNTER — Ambulatory Visit
Admission: RE | Admit: 2023-02-23 | Discharge: 2023-02-23 | Disposition: A | Discharge status: Home / Self Care | Payer: Medicaid Other | Source: Ambulatory Visit | Attending: Family Medicine | Admitting: Family Medicine

## 2023-02-23 ENCOUNTER — Ambulatory Visit: Payer: Medicaid Other

## 2023-02-23 DIAGNOSIS — M25532 Pain in left wrist: Secondary | ICD-10-CM

## 2023-02-23 DIAGNOSIS — M25439 Effusion, unspecified wrist: Secondary | ICD-10-CM

## 2023-03-15 ENCOUNTER — Encounter: Payer: Self-pay | Admitting: Physician Assistant

## 2023-03-15 ENCOUNTER — Ambulatory Visit (INDEPENDENT_AMBULATORY_CARE_PROVIDER_SITE_OTHER): Payer: Medicaid Other | Admitting: Physician Assistant

## 2023-03-15 DIAGNOSIS — M19032 Primary osteoarthritis, left wrist: Secondary | ICD-10-CM | POA: Diagnosis not present

## 2023-03-15 MED ORDER — NAPROXEN 500 MG PO TBEC: 500.0000 mg | DELAYED_RELEASE_TABLET | Freq: Two times a day (BID) | ORAL | 2 refills | Status: DC | PRN
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
## 2023-08-07 ENCOUNTER — Other Ambulatory Visit: Payer: Self-pay | Admitting: Family Medicine

## 2023-08-07 DIAGNOSIS — F17218 Nicotine dependence, cigarettes, with other nicotine-induced disorders: Secondary | ICD-10-CM

## 2023-08-07 DIAGNOSIS — Z122 Encounter for screening for malignant neoplasm of respiratory organs: Secondary | ICD-10-CM

## 2023-08-23 ENCOUNTER — Ambulatory Visit
Admission: RE | Admit: 2023-08-23 | Discharge: 2023-08-23 | Disposition: A | Discharge status: Home / Self Care | Payer: 59 | Source: Ambulatory Visit | Attending: Family Medicine | Admitting: Family Medicine

## 2023-08-23 DIAGNOSIS — Z122 Encounter for screening for malignant neoplasm of respiratory organs: Secondary | ICD-10-CM

## 2023-08-23 DIAGNOSIS — F17218 Nicotine dependence, cigarettes, with other nicotine-induced disorders: Secondary | ICD-10-CM

## 2023-09-27 ENCOUNTER — Other Ambulatory Visit: Payer: Self-pay | Admitting: Physician Assistant

## 2023-11-28 ENCOUNTER — Emergency Department (HOSPITAL_COMMUNITY)
Admission: EM | Admit: 2023-11-28 | Discharge: 2023-11-28 | Disposition: A | Discharge status: Home / Self Care | Attending: Emergency Medicine | Admitting: Emergency Medicine

## 2023-11-28 DIAGNOSIS — J449 Chronic obstructive pulmonary disease, unspecified: Secondary | ICD-10-CM | POA: Diagnosis not present

## 2023-11-28 DIAGNOSIS — T783XXA Angioneurotic edema, initial encounter: Secondary | ICD-10-CM | POA: Diagnosis not present

## 2023-11-28 DIAGNOSIS — Z85818 Personal history of malignant neoplasm of other sites of lip, oral cavity, and pharynx: Secondary | ICD-10-CM | POA: Insufficient documentation

## 2023-11-28 DIAGNOSIS — K130 Diseases of lips: Secondary | ICD-10-CM | POA: Diagnosis present

## 2023-11-28 DIAGNOSIS — N189 Chronic kidney disease, unspecified: Secondary | ICD-10-CM | POA: Diagnosis not present

## 2023-11-28 DIAGNOSIS — Z7951 Long term (current) use of inhaled steroids: Secondary | ICD-10-CM | POA: Diagnosis not present

## 2023-11-28 DIAGNOSIS — Z7982 Long term (current) use of aspirin: Secondary | ICD-10-CM | POA: Diagnosis not present

## 2023-11-28 LAB — CBC
HCT: 33.1 % — ABNORMAL LOW (ref 39.0–52.0)
Hemoglobin: 11.1 g/dL — ABNORMAL LOW (ref 13.0–17.0)
MCH: 33.8 pg (ref 26.0–34.0)
MCHC: 33.5 g/dL (ref 30.0–36.0)
MCV: 100.9 fL — ABNORMAL HIGH (ref 80.0–100.0)
Platelets: 176 10*3/uL (ref 150–400)
RBC: 3.28 MIL/uL — ABNORMAL LOW (ref 4.22–5.81)
RDW: 15 % (ref 11.5–15.5)
WBC: 4.8 10*3/uL (ref 4.0–10.5)
nRBC: 0 % (ref 0.0–0.2)

## 2023-11-28 LAB — BASIC METABOLIC PANEL
Anion gap: 12 (ref 5–15)
BUN: 17 mg/dL (ref 8–23)
CO2: 22 mmol/L (ref 22–32)
Calcium: 8.6 mg/dL — ABNORMAL LOW (ref 8.9–10.3)
Chloride: 100 mmol/L (ref 98–111)
Creatinine, Ser: 1.58 mg/dL — ABNORMAL HIGH (ref 0.61–1.24)
GFR, Estimated: 48 mL/min — ABNORMAL LOW (ref >60)
Glucose, Bld: 63 mg/dL — ABNORMAL LOW (ref 70–99)
Potassium: 4.9 mmol/L (ref 3.5–5.1)
Sodium: 134 mmol/L — ABNORMAL LOW (ref 135–145)

## 2023-11-28 MED ORDER — DIPHENHYDRAMINE HCL 50 MG/ML IJ SOLN
25.0000 mg | Freq: Once | INTRAMUSCULAR | Status: AC
  Administered 2023-11-28: 25 mg via INTRAVENOUS
  Filled 2023-11-28: qty 1

## 2023-11-28 MED ORDER — FAMOTIDINE 20 MG PO TABS: 20.0000 mg | ORAL_TABLET | Freq: Two times a day (BID) | ORAL | 0 refills | Status: DC

## 2023-11-28 MED ORDER — DIPHENHYDRAMINE HCL 25 MG PO TABS: 25.0000 mg | ORAL_TABLET | Freq: Four times a day (QID) | ORAL | 0 refills | Status: AC

## 2023-11-28 MED ORDER — LOSARTAN POTASSIUM 25 MG PO TABS: 25.0000 mg | ORAL_TABLET | Freq: Every day | ORAL | 0 refills | Status: DC

## 2023-11-28 MED ORDER — PREDNISONE 50 MG PO TABS: 50.0000 mg | ORAL_TABLET | Freq: Every day | ORAL | 0 refills | Status: DC

## 2023-11-28 MED ORDER — METHYLPREDNISOLONE SODIUM SUCC 125 MG IJ SOLR
125.0000 mg | Freq: Once | INTRAMUSCULAR | Status: AC
  Administered 2023-11-28: 125 mg via INTRAVENOUS
  Filled 2023-11-28: qty 2

## 2023-11-28 NOTE — Discharge Instructions (Addendum)
 Take the antihistamines and steroids to help treat the allergic reaction.  It is possible your reaction is related to her lisinopril blood pressure medication.  Stop taking that.  I wrote a prescription for alternative medication Cozaar (losartan).  Return to the ER for worsening symptoms

## 2023-11-28 NOTE — ED Provider Notes (Signed)
 [place] EMERGENCY DEPARTMENT AT [facility] Provider Note   CSN: [id number] Arrival date & time: 11/28/23  4098     History  Chief Complaint  Patient presents with   Oral Swelling    [name] [name] is a 68 y.o. male.  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  Patient states he was celebrating yesterday with friends.  He was drinking alcohol and smoking.  He does not recall having any injuries but yesterday evening he noticed a small cut on his lip.  He started developing swelling primarily on the right side of his lip.  It continued and this morning it was worse.  He came to the ED for evaluation.  He is not having any trouble with chest pain.  He denies any shortness of breath.  He is not having any difficulty breathing or swallowing.  He denies any toothache  Home Medications Prior to Admission medications   Medication Sig Start Date End Date Taking? Authorizing Provider  diphenhydrAMINE (BENADRYL) 25 MG tablet Take 1 tablet (25 mg total) by mouth every 6 (six) hours. 11/28/23  Yes Linwood Dibbles, MD  famotidine (PEPCID) 20 MG tablet Take 1 tablet (20 mg total) by mouth 2 (two) times daily. 11/28/23  Yes Linwood Dibbles, MD  losartan (COZAAR) 25 MG tablet Take 1 tablet (25 mg total) by mouth daily. 11/28/23  Yes Linwood Dibbles, MD  predniSONE (DELTASONE) 50 MG tablet Take 1 tablet (50 mg total) by mouth daily. 11/28/23  Yes Linwood Dibbles, MD  amLODipine (NORVASC) 5 MG tablet Take 5 mg by mouth daily. 07/13/20  Yes [provider]  aspirin EC 81 MG tablet Take 81 mg by mouth daily.   Yes [provider]  atorvastatin (LIPITOR) 40 MG tablet Take 40 mg by mouth at bedtime.   Yes [provider]  fluticasone (FLONASE) 50 MCG/ACT nasal spray Place 1 spray into both nostrils daily as needed for allergies.   Yes [provider]  latanoprost (XALATAN) 0.005 %  ophthalmic solution Place 1 drop into both eyes at bedtime.   Yes [provider]  levothyroxine (SYNTHROID) 25 MCG tablet Take 25 mcg by mouth daily before breakfast. 09/27/23 12/26/23 Yes [provider]  naproxen (NAPROSYN) 500 MG tablet TAKE 1 TABLET (500 MG TOTAL) BY MOUTH 2 (TWO) TIMES DAILY AS NEEDED. 09/27/23  Yes Cristie Hem, PA-C      Allergies    Patient has no known allergies.    Review of Systems   Review of Systems  Physical Exam Updated Vital Signs BP 108/83   Pulse 67   Temp 97.8 F (36.6 C) (Oral)   Resp 14   SpO2 100%  Physical Exam Vitals and nursing note reviewed.  Constitutional:      General: He is not in acute distress.    Appearance: He is well-developed. He is not diaphoretic.  HENT:     Head: Normocephalic and atraumatic.     Right Ear: External ear normal.     Left Ear: External ear normal.     Mouth/Throat:     Mouth: No oral lesions.     Dentition: No dental tenderness or dental caries.     Tongue: No lesions.     Palate: No mass.     Pharynx: No pharyngeal swelling, oropharyngeal exudate, posterior oropharyngeal erythema or uvula swelling.     Tonsils:  No tonsillar exudate or tonsillar abscesses.     Comments: Edema noted of the lips, asymmetrical primarily on the right side, numerous absent teeth Eyes:     General: No scleral icterus.       Right eye: No discharge.        Left eye: No discharge.     Conjunctiva/sclera: Conjunctivae normal.  Neck:     Trachea: No tracheal deviation.  Cardiovascular:     Rate and Rhythm: Normal rate.  Pulmonary:     Effort: Pulmonary effort is normal. No respiratory distress.     Breath sounds: No stridor.  Abdominal:     General: There is no distension.  Musculoskeletal:        General: No swelling or deformity.     Cervical back: Neck supple.  Skin:    General: Skin is warm and dry.     Findings: No rash.  Neurological:     Mental Status: He is alert. Mental status is at  baseline.     Cranial Nerves: No dysarthria or facial asymmetry.     Motor: No seizure activity.     ED Results / Procedures / Treatments   Labs (all labs ordered are listed, but only abnormal results are displayed) Labs Reviewed  CBC - Abnormal; Notable for the following components:      Result Value   RBC 3.28 (*)    Hemoglobin 11.1 (*)    HCT 33.1 (*)    MCV 100.9 (*)    All other components within normal limits  BASIC METABOLIC PANEL - Abnormal; Notable for the following components:   Sodium 134 (*)    Glucose, Bld 63 (*)    Creatinine, Ser 1.58 (*)    Calcium 8.6 (*)    GFR, Estimated 48 (*)    All other components within normal limits    EKG None  Radiology No results found.  Procedures Procedures    Medications Ordered in ED Medications  diphenhydrAMINE (BENADRYL) injection 25 mg (25 mg Intravenous Given 11/28/23 0721)  methylPREDNISolone sodium succinate (SOLU-MEDROL) 125 mg/2 mL injection 125 mg (125 mg Intravenous Given 11/28/23 1610)    ED Course/ Medical Decision Making/ A&P Clinical Course as of 11/28/23 0848  Wed Nov 28, 2023  0834 Metabolic panel unremarkable with stable renal function. [JK]  0834 CBC(!) Hemoglobin decreased.  Patient not having any signs of active bleeding.  Will have him follow-up as an outpatient [JK]    Clinical Course User Index [JK] Linwood Dibbles, MD                                 Medical Decision Making Problems Addressed: Angioedema, initial encounter: acute illness or injury that poses a threat to life or bodily functions  Amount and/or Complexity of Data Reviewed Labs: ordered. Decision-making details documented in ED Course.  Risk OTC drugs. Prescription drug management.   Patient presented to the ED for evaluation of lip swelling.  Patient not having any evidence of swelling in his tongue.  No involvement of his oropharynx.  No signs of airway compromise.  Patient was monitored in the ED.  His symptoms appear to  be improving.  I do not think he requires hospitalization for monitoring.  I do not think this is related to mass or his history of malignancy.  Findings are not suggestive of acute infection.  Patient denies any trauma.  He is not having  any pain or tenderness.  It is possible this could be a reaction to his lisinopril medication.  Will have him continue steroids and antihistamines.  Will change his lisinopril to losartan        Final Clinical Impression(s) / ED Diagnoses Final diagnoses:  Angioedema, initial encounter    Rx / DC Orders ED Discharge Orders          Ordered    diphenhydrAMINE (BENADRYL) 25 MG tablet  Every 6 hours        11/28/23 0847    predniSONE (DELTASONE) 50 MG tablet  Daily        11/28/23 0847    famotidine (PEPCID) 20 MG tablet  2 times daily        11/28/23 0847    losartan (COZAAR) 25 MG tablet  Daily        11/28/23 0847              Linwood Dibbles, MD 11/28/23 [phone]

## 2023-11-28 NOTE — ED Notes (Signed)
Pt verbalized understanding of discharge instructions.pt ambulated from ed with steady gait.

## 2023-11-28 NOTE — ED Notes (Signed)
 Suction set up at bedside pt instructed on use and is using it to help manage secretions

## 2023-11-28 NOTE — ED Triage Notes (Addendum)
 Pt in with lip and cheek swelling, states he noticed his R lower lip was bleeding from a cut last night. He put tissue paper on it and the bleeding stopped, then woke up with lip swelling. In chart, pt does take Lisinopril. Says his throat feels tight

## 2024-04-03 ENCOUNTER — Other Ambulatory Visit: Payer: Self-pay | Admitting: Physician Assistant

## 2024-04-03 ENCOUNTER — Telehealth: Payer: Self-pay

## 2024-04-03 NOTE — Telephone Encounter (Signed)
 Tried to call patient about NSAID request. Number is not in service. He needs to contact his PCP for refills on Naproxen .

## 2024-04-03 NOTE — Telephone Encounter (Signed)
 Tried to call patient about NSAID refill request from pharmacy. Number is not in service. He needs to contact his PCP for refills on Naproxen .

## 2024-04-03 NOTE — Telephone Encounter (Signed)
 Please have patient reach out to pcp for nsaids

## 2024-04-14 ENCOUNTER — Emergency Department (HOSPITAL_COMMUNITY)

## 2024-04-14 ENCOUNTER — Encounter (HOSPITAL_COMMUNITY): Payer: Self-pay

## 2024-04-14 ENCOUNTER — Emergency Department (HOSPITAL_COMMUNITY)
Admission: EM | Admit: 2024-04-14 | Discharge: 2024-04-14 | Disposition: A | Discharge status: Home / Self Care | Attending: Emergency Medicine | Admitting: Emergency Medicine

## 2024-04-14 ENCOUNTER — Other Ambulatory Visit: Payer: Self-pay

## 2024-04-14 DIAGNOSIS — Z7951 Long term (current) use of inhaled steroids: Secondary | ICD-10-CM | POA: Diagnosis not present

## 2024-04-14 DIAGNOSIS — R109 Unspecified abdominal pain: Secondary | ICD-10-CM | POA: Insufficient documentation

## 2024-04-14 DIAGNOSIS — N189 Chronic kidney disease, unspecified: Secondary | ICD-10-CM | POA: Insufficient documentation

## 2024-04-14 DIAGNOSIS — Z85818 Personal history of malignant neoplasm of other sites of lip, oral cavity, and pharynx: Secondary | ICD-10-CM | POA: Diagnosis not present

## 2024-04-14 DIAGNOSIS — J449 Chronic obstructive pulmonary disease, unspecified: Secondary | ICD-10-CM | POA: Diagnosis not present

## 2024-04-14 DIAGNOSIS — F1090 Alcohol use, unspecified, uncomplicated: Secondary | ICD-10-CM | POA: Diagnosis not present

## 2024-04-14 DIAGNOSIS — R079 Chest pain, unspecified: Secondary | ICD-10-CM

## 2024-04-14 HISTORY — DX: Essential (primary) hypertension: I10

## 2024-04-14 LAB — CBC WITH DIFFERENTIAL/PLATELET
Abs Immature Granulocytes: 0.03 K/uL (ref 0.00–0.07)
Basophils Absolute: 0 K/uL (ref 0.0–0.1)
Basophils Relative: 1 %
Eosinophils Absolute: 0.1 K/uL (ref 0.0–0.5)
Eosinophils Relative: 1 %
HCT: 41.7 % (ref 39.0–52.0)
Hemoglobin: 14.6 g/dL (ref 13.0–17.0)
Immature Granulocytes: 1 %
Lymphocytes Relative: 19 %
Lymphs Abs: 1.1 K/uL (ref 0.7–4.0)
MCH: 34 pg (ref 26.0–34.0)
MCHC: 35 g/dL (ref 30.0–36.0)
MCV: 97.2 fL (ref 80.0–100.0)
Monocytes Absolute: 0.6 K/uL (ref 0.1–1.0)
Monocytes Relative: 10 %
Neutro Abs: 4.1 K/uL (ref 1.7–7.7)
Neutrophils Relative %: 68 %
Platelets: 195 K/uL (ref 150–400)
RBC: 4.29 MIL/uL (ref 4.22–5.81)
RDW: 13.5 % (ref 11.5–15.5)
WBC: 5.8 K/uL (ref 4.0–10.5)
nRBC: 0 % (ref 0.0–0.2)

## 2024-04-14 LAB — URINALYSIS, ROUTINE W REFLEX MICROSCOPIC
Bacteria, UA: NONE SEEN
Bilirubin Urine: NEGATIVE
Glucose, UA: 50 mg/dL — AB
Ketones, ur: NEGATIVE mg/dL
Leukocytes,Ua: NEGATIVE
Nitrite: NEGATIVE
Protein, ur: NEGATIVE mg/dL
Specific Gravity, Urine: 1.006 (ref 1.005–1.030)
pH: 8 (ref 5.0–8.0)

## 2024-04-14 LAB — COMPREHENSIVE METABOLIC PANEL WITH GFR
ALT: 94 U/L — ABNORMAL HIGH (ref 0–44)
AST: 406 U/L — ABNORMAL HIGH (ref 15–41)
Albumin: 4.2 g/dL (ref 3.5–5.0)
Alkaline Phosphatase: 80 U/L (ref 38–126)
Anion gap: 16 — ABNORMAL HIGH (ref 5–15)
BUN: 13 mg/dL (ref 8–23)
CO2: 24 mmol/L (ref 22–32)
Calcium: 9.1 mg/dL (ref 8.9–10.3)
Chloride: 94 mmol/L — ABNORMAL LOW (ref 98–111)
Creatinine, Ser: 1.42 mg/dL — ABNORMAL HIGH (ref 0.61–1.24)
GFR, Estimated: 55 mL/min — ABNORMAL LOW (ref >60)
Glucose, Bld: 80 mg/dL (ref 70–99)
Potassium: 4.4 mmol/L (ref 3.5–5.1)
Sodium: 134 mmol/L — ABNORMAL LOW (ref 135–145)
Total Bilirubin: 1.1 mg/dL (ref 0.0–1.2)
Total Protein: 7.2 g/dL (ref 6.5–8.1)

## 2024-04-14 LAB — TROPONIN I (HIGH SENSITIVITY)
Troponin I (High Sensitivity): 13 ng/L (ref <18)
Troponin I (High Sensitivity): 12 ng/L (ref <18)

## 2024-04-14 LAB — LIPASE, BLOOD: Lipase: 34 U/L (ref 11–51)

## 2024-04-14 MED ORDER — LOSARTAN POTASSIUM 50 MG PO TABS
25.0000 mg | ORAL_TABLET | Freq: Once | ORAL | Status: AC
  Administered 2024-04-14 (×2): 25 mg via ORAL
  Filled 2024-04-14: qty 1

## 2024-04-14 MED ORDER — AMLODIPINE BESYLATE 5 MG PO TABS: 5.0000 mg | ORAL_TABLET | Freq: Every day | ORAL | 1 refills | Status: AC

## 2024-04-14 MED ORDER — FAMOTIDINE 20 MG PO TABS: 20.0000 mg | ORAL_TABLET | Freq: Two times a day (BID) | ORAL | 0 refills | Status: AC

## 2024-04-14 MED ORDER — AMLODIPINE BESYLATE 5 MG PO TABS
5.0000 mg | ORAL_TABLET | Freq: Once | ORAL | Status: AC
  Administered 2024-04-14 (×2): 5 mg via ORAL
  Filled 2024-04-14: qty 1

## 2024-04-14 MED ORDER — LOSARTAN POTASSIUM 25 MG PO TABS: 25.0000 mg | ORAL_TABLET | Freq: Every day | ORAL | 1 refills | Status: AC

## 2024-04-14 NOTE — ED Triage Notes (Signed)
 Pt c.o chest pain and right sided flank pain for 2 days. Pt also c.o dizziness today.

## 2024-04-14 NOTE — Discharge Instructions (Addendum)
 You were seen in the emergency department for right-sided chest pain Your blood work showed an elevation in your liver function test likely due to your increased drinking Your EKG and chest x-ray looked okay Your urine did not show evidence of a kidney stone or infection It is important that you follow-up at the behavioral health center on [address] to discuss your alcohol  use disorder Return to the emergency department for repeated chest pain trouble breathing or any other concerns Continue taking your previously prescribed blood pressure medications and all other medications at home We have called in refills of your losartan , amlodipine  and famotidine  to your [facility]

## 2024-04-14 NOTE — ED Notes (Signed)
 Pt provided discharge instructions and prescription information. Pt was given the opportunity to ask questions and questions were answered.

## 2024-04-14 NOTE — ED Provider Notes (Signed)
 [place] EMERGENCY DEPARTMENT AT [facility] Provider Note   CSN: [id number] Arrival date & time: 04/14/24  9191     Patient presents with: Chest Pain and Flank Pain   [name] [name] is a 68 y.o. male.  With a history of COPD CKD and throat cancer in remission who presents to the ED for right-sided chest pain.  Patient has experienced 2 days of right-sided chest pain tightness along with right flank pain.  Some associated nausea.  No fevers, chills, vomiting, change in bowel habits, dysuria or hematuria.  No prior history of kidney stones.  Drinks alcohol  daily 2 to 340 ounce beers daily.  Wants help for his drinking today.  Denies SI HI.    Chest Pain Flank Pain Associated symptoms include chest pain.       Prior to Admission medications   Medication Sig Start Date End Date Taking? Authorizing Provider  amLODipine  (NORVASC ) 5 MG tablet Take 1 tablet (5 mg total) by mouth daily. 04/14/24 05/14/24 Yes Pamella Ozell LABOR, DO  diphenhydrAMINE  (BENADRYL ) 25 MG tablet Take 1 tablet (25 mg total) by mouth every 6 (six) hours. Patient not taking: Reported on 04/14/2024 11/28/23   Randol Simmonds, MD  famotidine  (PEPCID ) 20 MG tablet Take 1 tablet (20 mg total) by mouth 2 (two) times daily. 04/14/24 05/14/24  Pamella Ozell LABOR, DO  losartan  (COZAAR ) 25 MG tablet Take 1 tablet (25 mg total) by mouth daily. 04/14/24   Pamella Ozell LABOR, DO  naproxen  (NAPROSYN ) 500 MG tablet TAKE 1 TABLET (500 MG TOTAL) BY MOUTH 2 (TWO) TIMES DAILY AS NEEDED. Patient not taking: Reported on 04/14/2024 09/27/23   Jule Ronal CROME, PA-C  predniSONE  (DELTASONE ) 50 MG tablet Take 1 tablet (50 mg total) by mouth daily. Patient not taking: Reported on 04/14/2024 11/28/23   Randol Simmonds, MD    Allergies: Patient has no known allergies.    Review of Systems  Cardiovascular:  Positive for chest pain.  Genitourinary:  Positive for flank pain.    Updated Vital Signs BP (!) 157/102   Pulse 70   Temp 98.4 F (36.9 C)  (Oral)   Resp 16   Ht 6' 1 (1.854 m)   Wt 63.5 kg   SpO2 99%   BMI 18.47 kg/m   Physical Exam Vitals and nursing note reviewed.  HENT:     Head: Normocephalic and atraumatic.  Eyes:     Pupils: Pupils are equal, round, and reactive to light.  Cardiovascular:     Rate and Rhythm: Normal rate and regular rhythm.  Pulmonary:     Effort: Pulmonary effort is normal.     Breath sounds: Normal breath sounds.  Abdominal:     Palpations: Abdomen is soft.     Tenderness: There is no abdominal tenderness.  Musculoskeletal:     Comments: Right flank and right thoracic tenderness to palpation  Skin:    General: Skin is warm and dry.  Neurological:     Mental Status: He is alert.  Psychiatric:        Mood and Affect: Mood normal.     (all labs ordered are listed, but only abnormal results are displayed) Labs Reviewed  COMPREHENSIVE METABOLIC PANEL WITH GFR - Abnormal; Notable for the following components:      Result Value   Sodium 134 (*)    Chloride 94 (*)    Creatinine, Ser 1.42 (*)    AST 406 (*)    ALT 94 (*)  GFR, Estimated 55 (*)    Anion gap 16 (*)    All other components within normal limits  URINALYSIS, ROUTINE W REFLEX MICROSCOPIC - Abnormal; Notable for the following components:   Color, Urine STRAW (*)    Glucose, UA 50 (*)    Hgb urine dipstick SMALL (*)    All other components within normal limits  CBC WITH DIFFERENTIAL/PLATELET  LIPASE, BLOOD  TROPONIN I (HIGH SENSITIVITY)  TROPONIN I (HIGH SENSITIVITY)    EKG: EKG Interpretation Date/Time:  Monday April 14 2024 08:14:42 EDT Ventricular Rate:  88 PR Interval:  129 QRS Duration:  97 QT Interval:  373 QTC Calculation: 452 R Axis:   115  Text Interpretation: Sinus rhythm Right atrial enlargement Right axis deviation Baseline wander in lead(s) II III aVF Confirmed by Pamella Sharper [phone]) on 04/14/2024 9:12:12 AM  Radiology: DG Chest Portable 1 View Result Date: 04/14/2024 EXAM: 1 VIEW XRAY OF  THE CHEST 04/14/2024 09:20:00 AM COMPARISON: None available. CLINICAL HISTORY: COPD, right side pain. Per triage notes: Pt c.o chest pain and right sided flank pain for 2 days. Pt also c.o dizziness today. FINDINGS: LUNGS AND PLEURA: No focal pulmonary opacity. No pulmonary edema. No pleural effusion. No pneumothorax. HEART AND MEDIASTINUM: No acute abnormality of the cardiac and mediastinal silhouettes. BONES AND SOFT TISSUES: No acute osseous abnormality. IMPRESSION: 1. No acute process. Electronically signed by: evalene berrigan 04/14/2024 10:07 AM EDT RP Workstation: [id number]     Procedures   Medications Ordered in the ED  amLODipine  (NORVASC ) tablet 5 mg (5 mg Oral Given 04/14/24 0858)  losartan  (COZAAR ) tablet 25 mg (25 mg Oral Given 04/14/24 0858)    Clinical Course as of 04/14/24 1200  Mon Apr 14, 2024  1156 Chest x-ray unremarkable.  Laboratory workup shows increased AST ALT from previous consistent with alcohol  use disorder renal function at baseline..  High-sensitivity troponin of 13 with delta of 12 not consistent with ACS.  UA shows no evidence of UTI or hematuria.  Low suspicion for kidney stone as cause of his discomfort at this time..  Patient's blood pressure has improved since taking his home meds amlodipine  losartan  here.  Resting comfortably and able to ambulate without issue.  Will provide him with the information for outpatient resources for alcohol  use disorder instruct for PCP follow-up [MP]    Clinical Course User Index [MP] Pamella Sharper LABOR, DO                                 Medical Decision Making 68 year old male with history presented to ED for 2 days of right-sided chest pain right flank pain.  No urinary symptoms.  Some associated nausea.  He is hypertensive.  Has not taken his medications in the last few days.  Drinks alcohol  daily and wants help for his drinking.  Denies SI HI.  No evidence of acute withdrawal symptoms now.  Differential diagnosis includes  ACS, dysrhythmia, kidney stone, pancreatitis and COPD exacerbation.  Will obtain chest x-ray EKG laboratory workup.  Will give him his home medications.  Do not feel as though he would meet inpatient criteria for his alcohol  use but will provide him with outpatient resources upon discharge.  Continue to monitor on telemetry  Amount and/or Complexity of Data Reviewed Labs: ordered. Radiology: ordered.  Risk Prescription drug management.        Final diagnoses:  Chest pain, unspecified type  Alcohol  use  ED Discharge Orders          Ordered    famotidine  (PEPCID ) 20 MG tablet  2 times daily        04/14/24 1200    losartan  (COZAAR ) 25 MG tablet  Daily        04/14/24 1200    amLODipine  (NORVASC ) 5 MG tablet  Daily        04/14/24 1200               Pamella Sharper A, DO 04/14/24 1200

## 2024-04-14 NOTE — ED Notes (Signed)
 CCMD called. Pt placed on monitor.

## 2024-07-28 ENCOUNTER — Other Ambulatory Visit: Payer: Self-pay

## 2024-07-28 ENCOUNTER — Encounter (HOSPITAL_COMMUNITY): Payer: Self-pay

## 2024-07-28 ENCOUNTER — Emergency Department (HOSPITAL_COMMUNITY)
Admission: EM | Admit: 2024-07-28 | Discharge: 2024-07-28 | Disposition: A | Discharge status: Home / Self Care | Attending: Emergency Medicine | Admitting: Emergency Medicine

## 2024-07-28 DIAGNOSIS — R21 Rash and other nonspecific skin eruption: Secondary | ICD-10-CM | POA: Diagnosis present

## 2024-07-28 DIAGNOSIS — J449 Chronic obstructive pulmonary disease, unspecified: Secondary | ICD-10-CM | POA: Insufficient documentation

## 2024-07-28 MED ORDER — METHYLPREDNISOLONE 4 MG PO TBPK
ORAL_TABLET | ORAL | 0 refills | Status: AC
Start: 2024-07-28 — End: ?

## 2024-07-28 MED ORDER — PREDNISONE 20 MG PO TABS
60.0000 mg | ORAL_TABLET | Freq: Once | ORAL | Status: AC
  Administered 2024-07-28: 60 mg via ORAL
  Filled 2024-07-28: qty 3

## 2024-07-28 NOTE — Discharge Instructions (Addendum)
 Take your next dose of steroid tomorrow and follow prescription.  Please return if symptoms worsen.  Follow-up with your primary care doctor.

## 2024-07-28 NOTE — ED Provider Notes (Signed)
 [place] EMERGENCY DEPARTMENT AT [facility] Provider Note   CSN: [id number] Arrival date & time: 07/28/24  1106     Patient presents with: Rash   [name] [name] is a 68 y.o. male.   Patient here with redness and itchiness over the left side of his neck for the last few days.  History of radiation and treatment for tonsillar cancer in the past.  He has been itching and scratching the last several days.  Denies any difficulty eating or drinking.  Denies any fevers or chills.  Denies any new medications.  Sounds like this area will get irritated at times.  He has a history of hepatitis COPD.  The history is provided by the patient.       Prior to Admission medications   Medication Sig Start Date End Date Taking? Authorizing Provider  methylPREDNISolone  (MEDROL  DOSEPAK) 4 MG TBPK tablet Follow package insert 07/28/24  Yes Thoren Hosang, DO  amLODipine  (NORVASC ) 5 MG tablet Take 1 tablet (5 mg total) by mouth daily. 04/14/24 05/14/24  Pamella Ozell LABOR, DO  diphenhydrAMINE  (BENADRYL ) 25 MG tablet Take 1 tablet (25 mg total) by mouth every 6 (six) hours. Patient not taking: Reported on 04/14/2024 11/28/23   Randol Simmonds, MD  famotidine  (PEPCID ) 20 MG tablet Take 1 tablet (20 mg total) by mouth 2 (two) times daily. 04/14/24 05/14/24  Pamella Ozell LABOR, DO  losartan  (COZAAR ) 25 MG tablet Take 1 tablet (25 mg total) by mouth daily. 04/14/24   Pamella Ozell LABOR, DO  naproxen  (NAPROSYN ) 500 MG tablet TAKE 1 TABLET (500 MG TOTAL) BY MOUTH 2 (TWO) TIMES DAILY AS NEEDED. Patient not taking: Reported on 04/14/2024 09/27/23   Jule Ronal CROME, PA-C    Allergies: Patient has no known allergies.    Review of Systems  Updated Vital Signs BP (!) 126/92 (BP Location: Right Arm)   Pulse 81   Temp 98.2 F (36.8 C) (Oral)   Resp 16   SpO2 97%   Physical Exam Vitals and nursing note reviewed.  Constitutional:      General: He is not in acute distress.    Appearance: He is well-developed.  He is not ill-appearing.  HENT:     Head: Normocephalic and atraumatic.     Comments: There is no swelling to the neck there is no trismus drooling or submandibular swelling, there is no swelling to the lips tongue or posterior oropharynx    Nose: Nose normal.     Mouth/Throat:     Mouth: Mucous membranes are moist.  Eyes:     Extraocular Movements: Extraocular movements intact.     Conjunctiva/sclera: Conjunctivae normal.     Pupils: Pupils are equal, round, and reactive to light.  Cardiovascular:     Rate and Rhythm: Normal rate and regular rhythm.     Pulses: Normal pulses.     Heart sounds: Normal heart sounds. No murmur heard. Pulmonary:     Effort: Pulmonary effort is normal. No respiratory distress.     Breath sounds: Normal breath sounds.  Abdominal:     Palpations: Abdomen is soft.     Tenderness: There is no abdominal tenderness.  Musculoskeletal:        General: No swelling.     Cervical back: Normal range of motion and neck supple.  Skin:    General: Skin is warm and dry.     Capillary Refill: Capillary refill takes less than 2 seconds.     Findings: Rash present.  Comments: Dry scaly eczema type area over the left side of the neck left shoulder  Neurological:     General: No focal deficit present.     Mental Status: He is alert and oriented to person, place, and time.  Psychiatric:        Mood and Affect: Mood normal.     (all labs ordered are listed, but only abnormal results are displayed) Labs Reviewed - No data to display  EKG: None  Radiology: No results found.   Procedures   Medications Ordered in the ED  predniSONE  (DELTASONE ) tablet 60 mg (has no administration in time range)                                    Medical Decision Making Risk Prescription drug management.   [name] is here with rash over the left side of his neck.  History of COPD tonsillar cancer status post radiation.  Has got a scaly eczema type rash over the  left side of his neck and left shoulder.  There is no swelling or cellulitic changes.  There is no trismus drooling submandibular swelling or change in his voice.  He is not having any pain in his throat.  Not having any difficulty swallowing.  Overall this looks like some inflammatory process/eczema type process.  He did have radiation in this area and could just be some acute on chronic irritation to this area.  I have no concern for any deep space infectious process.  He is talking normally.  He is not having any difficulty eating or drinking.  He has no drooling or trismus.  Ultimately we will put him on prednisone  for few days and then follow-up with primary care.  Told to return if symptoms worsen.  Discharge.  I have no concern for major allergic reaction otherwise.  This has been ongoing for the last 5 or 6 days.  Recommend using some sort of moisturizing ointment as well on this.  Discharge.  This chart was dictated using voice recognition software.  Despite best efforts to proofread,  errors can occur which can change the documentation meaning.      Final diagnoses:  Rash    ED Discharge Orders          Ordered    methylPREDNISolone  (MEDROL  DOSEPAK) 4 MG TBPK tablet        07/28/24 1122               Ruthe Cornet, DO 07/28/24 1125

## 2024-07-28 NOTE — ED Triage Notes (Signed)
 PT arrives via POV. PT reports red, itchy, painful rash to left side of neck over the past few days. Pt AxOx4.

## 2024-08-05 ENCOUNTER — Other Ambulatory Visit: Payer: Self-pay | Admitting: Nurse Practitioner

## 2024-08-05 DIAGNOSIS — M542 Cervicalgia: Secondary | ICD-10-CM

## 2024-08-05 DIAGNOSIS — Z85818 Personal history of malignant neoplasm of other sites of lip, oral cavity, and pharynx: Secondary | ICD-10-CM

## 2024-08-13 ENCOUNTER — Inpatient Hospital Stay: Admission: RE | Admit: 2024-08-13 | Discharge: 2024-08-13 | Attending: Nurse Practitioner

## 2024-08-13 ENCOUNTER — Other Ambulatory Visit: Payer: Self-pay | Admitting: Nurse Practitioner

## 2024-08-13 DIAGNOSIS — M542 Cervicalgia: Secondary | ICD-10-CM

## 2024-08-13 DIAGNOSIS — Z85818 Personal history of malignant neoplasm of other sites of lip, oral cavity, and pharynx: Secondary | ICD-10-CM

## 2024-08-13 MED ORDER — IOPAMIDOL (ISOVUE-370) INJECTION 76%
60.0000 mL | Freq: Once | INTRAVENOUS | Status: AC | PRN
  Administered 2024-08-13: 11:00:00 60 mL via INTRAVENOUS
